# Patient Record
Sex: Female | Born: 1960 | Race: White | Hispanic: No | Marital: Married | Smoking: Never smoker
Health system: Southern US, Community
[De-identification: ages and names within clinical notes are randomized; demographics above are authoritative.]

## PROBLEM LIST (undated history)

## (undated) DIAGNOSIS — J45909 Unspecified asthma, uncomplicated: Secondary | ICD-10-CM

## (undated) DIAGNOSIS — T7840XA Allergy, unspecified, initial encounter: Secondary | ICD-10-CM

## (undated) DIAGNOSIS — F419 Anxiety disorder, unspecified: Secondary | ICD-10-CM

## (undated) DIAGNOSIS — R413 Other amnesia: Secondary | ICD-10-CM

## (undated) DIAGNOSIS — H59023 Cataract (lens) fragments in eye following cataract surgery, bilateral: Secondary | ICD-10-CM

## (undated) DIAGNOSIS — K219 Gastro-esophageal reflux disease without esophagitis: Secondary | ICD-10-CM

## (undated) DIAGNOSIS — E785 Hyperlipidemia, unspecified: Secondary | ICD-10-CM

## (undated) DIAGNOSIS — I491 Atrial premature depolarization: Secondary | ICD-10-CM

## (undated) DIAGNOSIS — F329 Major depressive disorder, single episode, unspecified: Secondary | ICD-10-CM

## (undated) DIAGNOSIS — I10 Essential (primary) hypertension: Secondary | ICD-10-CM

## (undated) HISTORY — DX: Allergy, unspecified, initial encounter: T78.40XA

## (undated) HISTORY — DX: Atrial premature depolarization: I49.1

## (undated) HISTORY — DX: Essential (primary) hypertension: I10

## (undated) HISTORY — DX: Unspecified asthma, uncomplicated: J45.909

## (undated) HISTORY — DX: Gastro-esophageal reflux disease without esophagitis: K21.9

## (undated) HISTORY — PX: OTHER SURGICAL HISTORY: SHX169

## (undated) HISTORY — DX: Hyperlipidemia, unspecified: E78.5

## (undated) HISTORY — DX: Cataract (lens) fragments in eye following cataract surgery, bilateral: H59.023

## (undated) HISTORY — DX: Major depressive disorder, single episode, unspecified: F32.9

---

## 1985-04-05 HISTORY — PX: CHOLECYSTECTOMY: SHX55

## 1985-04-05 HISTORY — PX: APPENDECTOMY: SHX54

## 1990-04-05 DIAGNOSIS — E785 Hyperlipidemia, unspecified: Secondary | ICD-10-CM

## 1990-04-05 DIAGNOSIS — I10 Essential (primary) hypertension: Secondary | ICD-10-CM

## 1990-04-05 HISTORY — DX: Essential (primary) hypertension: I10

## 1990-04-05 HISTORY — DX: Hyperlipidemia, unspecified: E78.5

## 1995-04-06 HISTORY — PX: ANTERIOR CRUCIATE LIGAMENT REPAIR: SHX115

## 1999-09-05 ENCOUNTER — Encounter: Payer: Self-pay | Admitting: Emergency Medicine

## 1999-09-05 ENCOUNTER — Emergency Department (HOSPITAL_COMMUNITY): Admission: EM | Admit: 1999-09-05 | Discharge: 1999-09-05 | Payer: Self-pay | Admitting: Emergency Medicine

## 2004-10-25 ENCOUNTER — Emergency Department (HOSPITAL_COMMUNITY): Admission: EM | Admit: 2004-10-25 | Discharge: 2004-10-26 | Payer: Self-pay | Admitting: Emergency Medicine

## 2008-09-26 ENCOUNTER — Emergency Department (HOSPITAL_COMMUNITY): Admission: EM | Admit: 2008-09-26 | Discharge: 2008-09-26 | Payer: Self-pay | Admitting: Emergency Medicine

## 2008-11-18 ENCOUNTER — Observation Stay (HOSPITAL_COMMUNITY): Admission: EM | Admit: 2008-11-18 | Discharge: 2008-11-20 | Payer: Self-pay | Admitting: Emergency Medicine

## 2008-11-19 ENCOUNTER — Encounter (INDEPENDENT_AMBULATORY_CARE_PROVIDER_SITE_OTHER): Payer: Self-pay | Admitting: Internal Medicine

## 2009-01-14 ENCOUNTER — Ambulatory Visit (HOSPITAL_COMMUNITY): Admission: RE | Admit: 2009-01-14 | Discharge: 2009-01-14 | Payer: Self-pay | Admitting: Obstetrics and Gynecology

## 2009-01-14 ENCOUNTER — Encounter (INDEPENDENT_AMBULATORY_CARE_PROVIDER_SITE_OTHER): Payer: Self-pay | Admitting: Obstetrics and Gynecology

## 2009-02-13 ENCOUNTER — Emergency Department (HOSPITAL_COMMUNITY): Admission: EM | Admit: 2009-02-13 | Discharge: 2009-02-13 | Payer: Self-pay | Admitting: Emergency Medicine

## 2010-07-09 LAB — COMPREHENSIVE METABOLIC PANEL
ALT: 16 U/L (ref 0–35)
AST: 27 U/L (ref 0–37)
Albumin: 3.3 g/dL — ABNORMAL LOW (ref 3.5–5.2)
Alkaline Phosphatase: 58 U/L (ref 39–117)
BUN: 7 mg/dL (ref 6–23)
CO2: 25 mEq/L (ref 19–32)
Calcium: 8.6 mg/dL (ref 8.4–10.5)
Chloride: 101 mEq/L (ref 96–112)
Creatinine, Ser: 0.66 mg/dL (ref 0.4–1.2)
GFR calc Af Amer: >60 mL/min (ref >60)
GFR calc non Af Amer: >60 mL/min (ref >60)
Glucose, Bld: 118 mg/dL — ABNORMAL HIGH (ref 70–99)
Potassium: 3 mEq/L — ABNORMAL LOW (ref 3.5–5.1)
Sodium: 134 mEq/L — ABNORMAL LOW (ref 135–145)
Total Bilirubin: 0.6 mg/dL (ref 0.3–1.2)
Total Protein: 7.1 g/dL (ref 6.0–8.3)

## 2010-07-09 LAB — CBC
HCT: 33.9 % — ABNORMAL LOW (ref 36.0–46.0)
Hemoglobin: 10.9 g/dL — ABNORMAL LOW (ref 12.0–15.0)
MCHC: 32.3 g/dL (ref 30.0–36.0)
MCV: 75.4 fL — ABNORMAL LOW (ref 78.0–100.0)
Platelets: 352 10*3/uL (ref 150–400)
RBC: 4.49 MIL/uL (ref 3.87–5.11)
RDW: 17.4 % — ABNORMAL HIGH (ref 11.5–15.5)
WBC: 6 10*3/uL (ref 4.0–10.5)

## 2010-07-09 LAB — GLUCOSE, CAPILLARY
Glucose-Capillary: 115 mg/dL — ABNORMAL HIGH (ref 70–99)
Glucose-Capillary: 127 mg/dL — ABNORMAL HIGH (ref 70–99)

## 2010-07-09 LAB — HCG, SERUM, QUALITATIVE: Preg, Serum: NEGATIVE

## 2010-07-11 LAB — CARDIAC PANEL(CRET KIN+CKTOT+MB+TROPI)
Relative Index: INVALID (ref 0.0–2.5)
Relative Index: INVALID (ref 0.0–2.5)
Total CK: 49 U/L (ref 7–177)
Troponin I: 0.01 ng/mL (ref 0.00–0.06)

## 2010-07-11 LAB — GLUCOSE, CAPILLARY
Glucose-Capillary: 108 mg/dL — ABNORMAL HIGH (ref 70–99)
Glucose-Capillary: 110 mg/dL — ABNORMAL HIGH (ref 70–99)
Glucose-Capillary: 110 mg/dL — ABNORMAL HIGH (ref 70–99)
Glucose-Capillary: 115 mg/dL — ABNORMAL HIGH (ref 70–99)
Glucose-Capillary: 122 mg/dL — ABNORMAL HIGH (ref 70–99)
Glucose-Capillary: 127 mg/dL — ABNORMAL HIGH (ref 70–99)
Glucose-Capillary: 138 mg/dL — ABNORMAL HIGH (ref 70–99)
Glucose-Capillary: 144 mg/dL — ABNORMAL HIGH (ref 70–99)
Glucose-Capillary: 151 mg/dL — ABNORMAL HIGH (ref 70–99)

## 2010-07-11 LAB — BASIC METABOLIC PANEL
BUN: 4 mg/dL — ABNORMAL LOW (ref 6–23)
BUN: 5 mg/dL — ABNORMAL LOW (ref 6–23)
CO2: 24 mEq/L (ref 19–32)
CO2: 24 mEq/L (ref 19–32)
Calcium: 9.4 mg/dL (ref 8.4–10.5)
Chloride: 107 mEq/L (ref 96–112)
Chloride: 110 mEq/L (ref 96–112)
Creatinine, Ser: 0.49 mg/dL (ref 0.4–1.2)
GFR calc non Af Amer: >60 mL/min (ref >60)
Glucose, Bld: 109 mg/dL — ABNORMAL HIGH (ref 70–99)
Glucose, Bld: 174 mg/dL — ABNORMAL HIGH (ref 70–99)
Potassium: 3.3 mEq/L — ABNORMAL LOW (ref 3.5–5.1)
Potassium: 3.6 mEq/L (ref 3.5–5.1)
Potassium: 4 mEq/L (ref 3.5–5.1)
Sodium: 136 mEq/L (ref 135–145)

## 2010-07-11 LAB — COMPREHENSIVE METABOLIC PANEL
ALT: 23 U/L (ref 0–35)
Alkaline Phosphatase: 65 U/L (ref 39–117)
BUN: 6 mg/dL (ref 6–23)
CO2: 24 mEq/L (ref 19–32)
Chloride: 102 mEq/L (ref 96–112)
GFR calc non Af Amer: >60 mL/min (ref >60)
Glucose, Bld: 125 mg/dL — ABNORMAL HIGH (ref 70–99)
Potassium: 3.2 mEq/L — ABNORMAL LOW (ref 3.5–5.1)
Sodium: 135 mEq/L (ref 135–145)
Total Bilirubin: 0.7 mg/dL (ref 0.3–1.2)

## 2010-07-11 LAB — DIFFERENTIAL
Basophils Absolute: 0.2 10*3/uL — ABNORMAL HIGH (ref 0.0–0.1)
Basophils Relative: 1 % (ref 0–1)
Eosinophils Absolute: 0.2 10*3/uL (ref 0.0–0.7)
Eosinophils Relative: 2 % (ref 0–5)
Eosinophils Relative: 4 % (ref 0–5)
Lymphocytes Relative: 22 % (ref 12–46)
Lymphs Abs: 1.9 10*3/uL (ref 0.7–4.0)
Lymphs Abs: 2.3 10*3/uL (ref 0.7–4.0)
Neutro Abs: 5.3 10*3/uL (ref 1.7–7.7)
Neutrophils Relative %: 50 % (ref 43–77)

## 2010-07-11 LAB — CBC
HCT: 31.9 % — ABNORMAL LOW (ref 36.0–46.0)
HCT: 33.8 % — ABNORMAL LOW (ref 36.0–46.0)
HCT: 38.9 % (ref 36.0–46.0)
Hemoglobin: 12.8 g/dL (ref 12.0–15.0)
MCHC: 32.6 g/dL (ref 30.0–36.0)
MCHC: 33.1 g/dL (ref 30.0–36.0)
MCV: 78.9 fL (ref 78.0–100.0)
MCV: 79 fL (ref 78.0–100.0)
Platelets: 208 10*3/uL (ref 150–400)
Platelets: 238 10*3/uL (ref 150–400)
Platelets: 328 10*3/uL (ref 150–400)
RDW: 17.9 % — ABNORMAL HIGH (ref 11.5–15.5)
WBC: 7.1 10*3/uL (ref 4.0–10.5)
WBC: 8.3 10*3/uL (ref 4.0–10.5)

## 2010-07-11 LAB — POCT CARDIAC MARKERS
CKMB, poc: <1 ng/mL — ABNORMAL LOW (ref 1.0–8.0)
Troponin i, poc: <0.05 ng/mL (ref 0.00–0.09)

## 2010-07-11 LAB — TSH: TSH: 2.83 u[IU]/mL (ref 0.350–4.500)

## 2010-07-13 LAB — POCT CARDIAC MARKERS
CKMB, poc: <1 ng/mL — ABNORMAL LOW (ref 1.0–8.0)
Troponin i, poc: <0.05 ng/mL (ref 0.00–0.09)

## 2010-07-13 LAB — DIFFERENTIAL
Basophils Absolute: 0.1 10*3/uL (ref 0.0–0.1)
Basophils Relative: 1 % (ref 0–1)
Eosinophils Absolute: 0.2 10*3/uL (ref 0.0–0.7)
Eosinophils Relative: 2 % (ref 0–5)
Monocytes Absolute: 0.6 10*3/uL (ref 0.1–1.0)

## 2010-07-13 LAB — BASIC METABOLIC PANEL
BUN: 9 mg/dL (ref 6–23)
CO2: 24 mEq/L (ref 19–32)
Calcium: 9.4 mg/dL (ref 8.4–10.5)
GFR calc non Af Amer: >60 mL/min (ref >60)
Glucose, Bld: 154 mg/dL — ABNORMAL HIGH (ref 70–99)
Potassium: 3.3 mEq/L — ABNORMAL LOW (ref 3.5–5.1)
Sodium: 140 mEq/L (ref 135–145)

## 2010-07-13 LAB — CBC
HCT: 34 % — ABNORMAL LOW (ref 36.0–46.0)
Hemoglobin: 11 g/dL — ABNORMAL LOW (ref 12.0–15.0)
MCHC: 32.2 g/dL (ref 30.0–36.0)
Platelets: 297 10*3/uL (ref 150–400)
RDW: 20.1 % — ABNORMAL HIGH (ref 11.5–15.5)

## 2010-08-18 NOTE — Consult Note (Signed)
NAME:  RETTA, Alicia Pope NO.:  [id number]   MEDICAL RECORD NO.:  [id number]          PATIENT TYPE:  INP   LOCATION:  5502                         FACILITY:  [facility]   PHYSICIAN:  Lyn Records, M.D.   DATE OF BIRTH:  06/30/1960   DATE OF CONSULTATION:  11/18/2008  DATE OF DISCHARGE:                                 CONSULTATION   REFERRING PHYSICIAN:  Elana Alm. Nicholos Johns, MD   CONCLUSIONS:  1. Chest pressure with left neck/jaw discomfort.  Rule out myocardial      infarction with markers and serial EKGs.  2. Diabetes mellitus for greater than 5 years.  3. Hypertension.  4. Hyperlipidemia.  5. Hypokalemia.   RECOMMENDATIONS:  1. Rule out MI with serial enzymes and EKGs.  2. Stress Cardiolite, November 19, 2008.  3. Check D-dimer.   COMMENT:  The patient is a 50 year old obese Caucasian female who has a  history of diabetes.  She developed sudden substernal discomfort.  She  has been having intermittent episodes over the past 2-3 weeks.  They are  associated with shortness of breath and radiation to the jaw on the left  side of her face.  She developed the discomfort that led to admission  late in the afternoon/early in the evening of November 18, 2008, and have  persisted for several hours.  Even after arriving in the emergency room,  the discomfort was continuous, without EKG changes and with a normal  initial cardiac marker.  She is currently painfree.  There was no  exertional component.   SIGNIFICANT MEDICAL PROBLEMS:  Listed above and in addition, there is an  apparent history of an anxiety disorder.   ALLERGIES:  IODINATED CONTRAST.   FAMILY HISTORY:  Negative for CAD.   HABITS:  Does not smoke or drink.   PHYSICAL EXAMINATION:  GENERAL:  The patient is in no acute distress.  VITAL SIGNS:  The blood pressure is 109/67, heart rate is 77.  NECK:  Short and stout.  Jugular veins are not visible.  LUNGS:  Clear.  CARDIAC:  No rub or gallop.  ABDOMEN:   Soft.  EXTREMITIES:  No edema.   The patient's EKG is unremarkable.  There is a slight left axis.  Chest  x-ray reveals no acute abnormality.  Initial laboratory data reveals a  creatinine of 0.53, BUN of 4, potassium 4.0.  Hemoglobin 11, WBCs 7.1.  initial set of cardiac biomarkers are normal.   DISCUSSION:  The patient's symptoms have to be taken seriously given her  multiple risk factors.  So far with a prolonged episode of discomfort,  the markers and EKGs are normal, which is reassuring.  She will need to  have a nuclear study to rule out coronary artery disease.      Lyn Records, M.D.  Electronically Signed     HWS/MEDQ  D:  11/19/2008  T:  11/19/2008  Job:  [id number]

## 2010-08-18 NOTE — H&P (Signed)
NAME:  Alicia Pope, Alicia Pope NO.:  [id number]   MEDICAL RECORD NO.:  [id number]          PATIENT TYPE:  INP   LOCATION:  5502                         FACILITY:  [facility]   PHYSICIAN:  Donalynn Furlong, MD      DATE OF BIRTH:  [birth year]   DATE OF ADMISSION:  11/17/2008  DATE OF DISCHARGE:                              HISTORY & PHYSICAL   PRIMARY CARE Suzann Lazaro:  Elana Alm. Nicholos Johns, M.D.   CHIEF COMPLAINT:  Chest pain.   HISTORY OF PRESENT ILLNESS:  Alicia Pope is a 50 year old Caucasian female  living with her husband in [place].  She presented to [facility] ED  tonight with the chest pain.  She was watching TV 6 p.m. yesterday  evening when she started having pressure-like chest pain in the left  side of chest and the center of chest.  The chest pain was without any  radiation.  She also started hurting in her left side of neck at the  same time.  She did not have any other associated symptoms with chest  pain.  The patient mentions that her chest pain is constant since 6  o'clock but intensity is reduced now after getting 325 mg of aspirin and  2 tablets of nitro. She took her lorazepam at home which did not relieve  her chest pain.  She tried to sleep which did not relieve her chest  pain.  She decided to come to the emergency department because she was  not getting any better and she thought she needed to see the doctor.  She denies any history of coronary artery disease, stress test, cardiac  cath or echocardiogram in the past.  She has history of episode of chest  pain in the past, most likely due to anxiety and started on Zoloft by  her primary care Kadon Andrus for that.  The patient denies any history of  clots in the lungs or pneumonia.  She has a history of occasional  gastroesophageal reflux disease.  The patient has had her gallbladder  and appendix removed in the past.   PAST MEDICAL HISTORY:  1. Diabetes mellitus.  2. Hypertension.  3. Hyperlipidemia.  4.  Cholecystectomy.  5. Appendectomy.  6. Four cesarean sections  7. ACL repair.   PAST SURGICAL HISTORY:  As per past medical history.   ALLERGIES:  IODINE.   HOME MEDICATION:  List includes Avalide, Avandamet, Crestor and Norvasc.  She also takes lorazepam and Zoloft.  Doses of these medications are not  known at this time.   REVIEW OF SYSTEMS:  Positive as per HPI, otherwise negative review of  systems done of 14 system.   SOCIAL HISTORY:  The patient lives with her husband.  She is not  working.  The patient denies any alcohol, drug or tobacco use in the  past.   FAMILY HISTORY:  No history of premature coronary artery disease in  father, mother, brother, sister or grandparents.  The patient's father  has  heart failure.   PHYSICAL EXAMINATION:  VITAL SIGNS: Blood pressure 100/61,  pulse 65 per  minute, respiration 18  per minute, temperature 98.6. Oxygen saturation  95% in room air.  GENERAL EXAM:  Alert, oriented x3, laying in bed without any acute  distress.  CARDIOVASCULAR:  S1-S2 regular.  No murmur or gallop.  LUNGS:  Clear to auscultation bilaterally.  No wheezing, rhonchi,  crackles.  ABDOMEN:  Nontender, nondistended.  Mild sounds present.  EXTREMITIES:  No clubbing, cyanosis or edema.  Pulses palpable in all 4  extremities.  HEAD:  Normocephalic nontraumatic.  EYES:  Pupils react to light and accommodation.  Extraocular muscles  intact.  ORAL CAVITY:  Oral mucosa moist.  No thrush noted.  NECK:  No thyromegaly or JVD.  SKIN:  No rash or bruise.  NEUROLOGICAL:  Exam shows intact cranial nerves, muscular strength,  sensation and reflexes.   LAB WORK:  CBC with differential unremarkable.  First set of cardiac  enzymes negative.  Chest x-ray unremarkable.  EKG unremarkable for any  acute ST-T changes.  Comprehensive metabolic panel is pending.   ASSESSMENT:  1. Atypical chest pain, rule out myocardial infarction.  Most likely      this chest pain is due to her  anxiety.  2. History of hypertension.  3. History of diabetes mellitus.  4. History of hyperlipidemia.  5. History of cholecystectomy.  6. History of appendectomy.  7. History of anxiety.   PLAN:  Will admit the patient on telemetry bed under [facility] with  a diagnosis of chest pain, hypokalemia.  We will check CBC, CMP, EKG in  the morning.  Will get cardiac enzymes with troponin at 6 a.m. and 12  p.m. We will check vital signs every 4 hours.  Will provide oxygen by  nasal cannula.  Will keep her n.p.o. except ice chips, water and  medications.  Will provide normal saline for hydration.  Will start  aspirin 325 mg daily, nitro paste 1 inch q.6 hours, metoprolol 25 mg  b.i.d.,  lisinopril 5 mg daily, simvastatin 20 mg daily, IV morphine 2  mg q.4 hours p.r.n., Lovenox 1 mg/kg subcutaneously q.12 hours, p.o.  potassium chloride 20 mEq q.8 hours x3 doses only and sliding scale  insulin with scale a.c. and h.s. tonight.  We will get will get exercise  Cardiolite stress test in the morning once MI is ruled out after three  negative troponins.  Will provide Tylenol, Phenergan, Ambien, laxative  p.r.n. for symptomatic control.  Further plan according to the workup  pending.      Donalynn Furlong, MD  Electronically Signed     TVP/MEDQ  D:  11/18/2008  T:  11/18/2008  Job:  [id number]   cc:   Molly Maduro A. Nicholos Johns, M.D.

## 2010-08-18 NOTE — Discharge Summary (Signed)
Alicia Pope, Alicia Pope              ACCOUNT NO.:  [id number]   MEDICAL RECORD NO.:  [id number]          PATIENT TYPE:  INP   LOCATION:  5502                         FACILITY:  [facility]   PHYSICIAN:  Kela Millin, M.D.DATE OF BIRTH:  [birth year]   DATE OF ADMISSION:  11/17/2008  DATE OF DISCHARGE:  11/20/2008                               DISCHARGE SUMMARY   DIAGNOSES AT THE TIME OF DISCHARGE:  1. Atypical chest pain.  2. Diabetes type 2.  3. Dyslipidemia.  4. Hypertension.   HISTORY OF PRESENT ILLNESS:  Ms. Oliveto is a 50 year old female, who was  admitted on November 18, 2008, with chief complaint of chest pain.  She  presented to the [facility] Emergency Department after having noted  pressure-like chest pain in the left side of her chest.  She also noted  some pain in the left side of her neck at the same time.  She was  admitted for further evaluation and treatment.   PAST MEDICAL HISTORY:  1. Diabetes type 2.  2. Hypertension.  3. Hyperlipidemia.  4. Status post cholecystectomy.  5. Status post appendectomy.  6. Status post C-section x4.  7. ACL repair.   COURSE OF HOSPITALIZATION:  1. Atypical chest pain.  The patient was admitted.  She underwent      serial cardiac enzymes, which were negative x3.  A Cardiology      consult was requested, and the patient was seen in consultation by      Dr. Verdis Prime.  He recommended a Cardiolite, which was performed      on November 20, 2008.  Cardiolite, no fixed or reversible defects to      suggest inducible ischemia, it notes a normal LV wall motion and      thickening.  Normal ejection fraction was also noted.  Of note, she      does have some mild hypotension, which was attributed to      nitroglycerin patch.  This is been discontinued.  Her blood      pressure at the time of discharge is 108/67.  We will continue to      hold her antihypertensive medications at the time of discharge and      defer resuming these meds to her  primary care physician at her      followup appointment.  In addition, we will continue a baby aspirin      at the time of discharge for cardiac protection due to multiple      risk factors.   MEDICATIONS AT THE TIME OF DISCHARGE:  1. Avandamet 4 - 500 p.o. b.i.d.  2. Crestor 20 mg p.o. daily.  3. Iron tabs 325 mg p.o. t.i.d.  4. Zoloft 50 mg p.o. daily.  5. Lorazepam 1 mg p.r.n. b.i.d. for anxiety.  6. Ambien 10 mg p.o. at bedtime p.r.n. sleep.  7. Aspirin 81 mg p.o. daily.  8. Norvasc 5 mg p.o. daily.  9. Avalide 300/25 mg p.o. daily, currently on hold.   PERTINENT LABORATORIES AT THE TIME OF DISCHARGE:  Sodium 139, potassium  3.6, chloride 110,  BUN 5, creatinine 0.49, and glucose 109.  Hemoglobin  10.6, hematocrit 31.9, and white blood cell count 6, and platelets 208.  Hemoglobin A1c 6.3.   DISPOSITION:  She will be discharged to home.   FOLLOWUP:  She is instructed to follow up with Dr. Elias Else in 1  week and contact the office for an appointment.   Greater than 30 minutes was spent on discharge planning.      Sandford Craze, NP      Kela Millin, M.D.  Electronically Signed    MO/MEDQ  D:  11/20/2008  T:  11/21/2008  Job:  [id number]   cc:   Lyn Records, M.D.  Robert A. Nicholos Johns, M.D.

## 2011-04-06 DIAGNOSIS — K219 Gastro-esophageal reflux disease without esophagitis: Secondary | ICD-10-CM

## 2011-04-06 DIAGNOSIS — F419 Anxiety disorder, unspecified: Secondary | ICD-10-CM

## 2011-04-06 DIAGNOSIS — F32A Depression, unspecified: Secondary | ICD-10-CM

## 2011-04-06 HISTORY — DX: Depression, unspecified: F32.A

## 2011-04-06 HISTORY — DX: Gastro-esophageal reflux disease without esophagitis: K21.9

## 2011-04-06 HISTORY — DX: Anxiety disorder, unspecified: F41.9

## 2011-05-20 ENCOUNTER — Other Ambulatory Visit: Payer: Self-pay | Admitting: Physician Assistant

## 2011-05-20 DIAGNOSIS — H5702 Anisocoria: Secondary | ICD-10-CM

## 2011-05-21 ENCOUNTER — Ambulatory Visit
Admission: RE | Admit: 2011-05-21 | Discharge: 2011-05-21 | Disposition: A | Discharge status: Home / Self Care | Payer: 59 | Source: Ambulatory Visit | Attending: Physician Assistant | Admitting: Physician Assistant

## 2011-05-21 DIAGNOSIS — H5702 Anisocoria: Secondary | ICD-10-CM

## 2011-05-21 MED ORDER — GADOBENATE DIMEGLUMINE 529 MG/ML IV SOLN
20.0000 mL | Freq: Once | INTRAVENOUS | Status: AC | PRN
  Administered 2011-05-21: 20 mL via INTRAVENOUS

## 2011-07-31 ENCOUNTER — Emergency Department (HOSPITAL_COMMUNITY)
Admission: EM | Admit: 2011-07-31 | Discharge: 2011-07-31 | Disposition: A | Discharge status: Home / Self Care | Payer: 59 | Attending: Emergency Medicine | Admitting: Emergency Medicine

## 2011-07-31 DIAGNOSIS — F419 Anxiety disorder, unspecified: Secondary | ICD-10-CM

## 2011-07-31 DIAGNOSIS — E119 Type 2 diabetes mellitus without complications: Secondary | ICD-10-CM | POA: Insufficient documentation

## 2011-07-31 DIAGNOSIS — R55 Syncope and collapse: Secondary | ICD-10-CM | POA: Insufficient documentation

## 2011-07-31 DIAGNOSIS — R51 Headache: Secondary | ICD-10-CM | POA: Insufficient documentation

## 2011-07-31 DIAGNOSIS — F411 Generalized anxiety disorder: Secondary | ICD-10-CM | POA: Insufficient documentation

## 2011-07-31 HISTORY — DX: Anxiety disorder, unspecified: F41.9

## 2011-07-31 HISTORY — DX: Other amnesia: R41.3

## 2011-07-31 LAB — URINALYSIS, ROUTINE W REFLEX MICROSCOPIC
Bilirubin Urine: NEGATIVE
Leukocytes, UA: NEGATIVE
Nitrite: NEGATIVE
Specific Gravity, Urine: 1.014 (ref 1.005–1.030)
Urobilinogen, UA: 0.2 mg/dL (ref 0.0–1.0)
pH: 6 (ref 5.0–8.0)

## 2011-07-31 LAB — POCT I-STAT, CHEM 8
Glucose, Bld: 134 mg/dL — ABNORMAL HIGH (ref 70–99)
HCT: 43 % (ref 36.0–46.0)
Hemoglobin: 14.6 g/dL (ref 12.0–15.0)
Potassium: 3.8 mEq/L (ref 3.5–5.1)
Sodium: 143 mEq/L (ref 135–145)

## 2011-07-31 MED ORDER — ACETAMINOPHEN 325 MG PO TABS: 650.0000 mg | ORAL_TABLET | Freq: Once | ORAL | Status: DC
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
## 2011-07-31 NOTE — Discharge Instructions (Signed)
Follow up with the cardiologist you have been referred to.  Follow up with your psychiatrist and/or family physician, as well as the neurologist as scheduled.  Drink plenty of fluids.  You should return to the ER if you develop chest pain, shortness of breath, palpitations or another passing out spell.
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
## 2011-07-31 NOTE — ED Notes (Signed)
Pt states she "just wants to go home" as she is walking out of room to find bathroom,  Pt escorted to bathroom for urine sample and ask to please wait until seen by MD

## 2011-08-01 ENCOUNTER — Encounter (HOSPITAL_COMMUNITY): Payer: Self-pay | Admitting: Emergency Medicine

## 2011-08-01 NOTE — ED Provider Notes (Signed)
Medical screening examination/treatment/procedure(s) were performed by non-physician practitioner and as supervising physician I was immediately available for consultation/collaboration.  Jaray Boliver, MD 08/01/11 1010 

## 2013-02-26 ENCOUNTER — Emergency Department (HOSPITAL_COMMUNITY): Payer: 59

## 2013-02-26 ENCOUNTER — Encounter (HOSPITAL_COMMUNITY): Payer: Self-pay | Admitting: Emergency Medicine

## 2013-02-26 ENCOUNTER — Emergency Department (HOSPITAL_COMMUNITY)
Admission: EM | Admit: 2013-02-26 | Discharge: 2013-02-26 | Disposition: A | Discharge status: Home / Self Care | Payer: 59 | Attending: Emergency Medicine | Admitting: Emergency Medicine

## 2013-02-26 DIAGNOSIS — R21 Rash and other nonspecific skin eruption: Secondary | ICD-10-CM

## 2013-02-26 DIAGNOSIS — D696 Thrombocytopenia, unspecified: Secondary | ICD-10-CM

## 2013-02-26 DIAGNOSIS — E119 Type 2 diabetes mellitus without complications: Secondary | ICD-10-CM | POA: Insufficient documentation

## 2013-02-26 DIAGNOSIS — Z79899 Other long term (current) drug therapy: Secondary | ICD-10-CM | POA: Insufficient documentation

## 2013-02-26 DIAGNOSIS — F411 Generalized anxiety disorder: Secondary | ICD-10-CM | POA: Insufficient documentation

## 2013-02-26 DIAGNOSIS — J159 Unspecified bacterial pneumonia: Secondary | ICD-10-CM | POA: Insufficient documentation

## 2013-02-26 DIAGNOSIS — J189 Pneumonia, unspecified organism: Secondary | ICD-10-CM

## 2013-02-26 LAB — CBC WITH DIFFERENTIAL/PLATELET
Basophils Absolute: 0 10*3/uL (ref 0.0–0.1)
Eosinophils Absolute: 0.3 10*3/uL (ref 0.0–0.7)
Eosinophils Relative: 6 % — ABNORMAL HIGH (ref 0–5)
Hemoglobin: 13 g/dL (ref 12.0–15.0)
Lymphocytes Relative: 18 % (ref 12–46)
Lymphs Abs: 1 10*3/uL (ref 0.7–4.0)
MCH: 27.8 pg (ref 26.0–34.0)
Neutro Abs: 3.6 10*3/uL (ref 1.7–7.7)
Neutrophils Relative %: 70 % (ref 43–77)
Platelets: 129 10*3/uL — ABNORMAL LOW (ref 150–400)
RBC: 4.68 MIL/uL (ref 3.87–5.11)
RDW: 15 % (ref 11.5–15.5)
WBC: 5.2 10*3/uL (ref 4.0–10.5)

## 2013-02-26 LAB — COMPREHENSIVE METABOLIC PANEL
ALT: 37 U/L — ABNORMAL HIGH (ref 0–35)
AST: 43 U/L — ABNORMAL HIGH (ref 0–37)
Albumin: 3.2 g/dL — ABNORMAL LOW (ref 3.5–5.2)
Alkaline Phosphatase: 66 U/L (ref 39–117)
CO2: 21 mEq/L (ref 19–32)
Calcium: 8.8 mg/dL (ref 8.4–10.5)
Chloride: 103 mEq/L (ref 96–112)
GFR calc Af Amer: >90 mL/min (ref >90)
GFR calc non Af Amer: >90 mL/min (ref >90)
Glucose, Bld: 158 mg/dL — ABNORMAL HIGH (ref 70–99)
Potassium: 3.4 mEq/L — ABNORMAL LOW (ref 3.5–5.1)
Sodium: 136 mEq/L (ref 135–145)

## 2013-02-26 LAB — POCT I-STAT TROPONIN I: Troponin i, poc: 0 ng/mL (ref 0.00–0.08)

## 2013-02-26 LAB — CG4 I-STAT (LACTIC ACID): Lactic Acid, Venous: 2.13 mmol/L (ref 0.5–2.2)

## 2013-02-26 LAB — URINALYSIS W MICROSCOPIC + REFLEX CULTURE
Bilirubin Urine: NEGATIVE
Glucose, UA: 100 mg/dL — AB
Hgb urine dipstick: NEGATIVE
Ketones, ur: 15 mg/dL — AB
Specific Gravity, Urine: 1.025 (ref 1.005–1.030)
Urobilinogen, UA: 0.2 mg/dL (ref 0.0–1.0)
pH: 6 (ref 5.0–8.0)

## 2013-02-26 MED ORDER — DEXTROSE 5 % IV SOLN
500.0000 mg | Freq: Once | INTRAVENOUS | Status: AC
  Administered 2013-02-26: 500 mg via INTRAVENOUS

## 2013-02-26 MED ORDER — ALBUTEROL SULFATE HFA 108 (90 BASE) MCG/ACT IN AERS
2.0000 | INHALATION_SPRAY | Freq: Once | RESPIRATORY_TRACT | Status: AC
  Administered 2013-02-26: 2 via RESPIRATORY_TRACT
  Filled 2013-02-26: qty 6.7

## 2013-02-26 MED ORDER — HYDROCODONE-HOMATROPINE 5-1.5 MG/5ML PO SYRP: 5.0000 mL | ORAL_SOLUTION | Freq: Four times a day (QID) | ORAL | Status: DC | PRN

## 2013-02-26 MED ORDER — AZITHROMYCIN 250 MG PO TABS: ORAL_TABLET | ORAL | Status: DC

## 2013-02-26 MED ORDER — LORAZEPAM 1 MG PO TABS
1.0000 mg | ORAL_TABLET | Freq: Once | ORAL | Status: AC
  Administered 2013-02-26: 1 mg via ORAL
  Filled 2013-02-26: qty 1

## 2013-02-26 MED ORDER — DEXTROSE 5 % IV SOLN
1.0000 g | Freq: Once | INTRAVENOUS | Status: AC
  Administered 2013-02-26: 1 g via INTRAVENOUS
  Filled 2013-02-26: qty 10

## 2013-02-26 MED ORDER — ACETAMINOPHEN 325 MG PO TABS
650.0000 mg | ORAL_TABLET | Freq: Once | ORAL | Status: AC
  Administered 2013-02-26: 650 mg via ORAL
  Filled 2013-02-26: qty 2

## 2013-02-26 MED ORDER — SODIUM CHLORIDE 0.9 % IV SOLN
Freq: Once | INTRAVENOUS | Status: AC
  Administered 2013-02-26: 07:00:00 via INTRAVENOUS

## 2013-02-26 NOTE — ED Provider Notes (Signed)
CSN: [id number]     Arrival date & time 02/26/13  0608 History   First MD Initiated Contact with Patient 02/26/13 0631     Chief Complaint  Patient presents with  . Shortness of Breath    patient stated went to the doctor office one week ago, started cold like symptoms yesterday, came in with shortness of breath   (Consider location/radiation/quality/duration/timing/severity/associated sxs/prior Treatment) HPI Alicia Pope is a 52 y.o. female who presents to emergency department with complaint of rash, fever, shortness of breath. Patient states that she saw a doctor approximately week ago for a rash that began on her feet and since then spread up to her thighs. States that her Dr. diagnosed her with a staph infection and placed on doxycycline which she stating. States rash is worsening. States rash is nontender, but very itchy. States also began having cough approximately 3 days ago. States that she asked her Dr. about a cough and was told to take some Mucinex. States that last night cough is worsened and this morning she began feeling short of breath. States that she has had similar shortness of breath in the past when had anxiety. Patient states that she is very anxious right now, states "I feel rattling in my throat and that is what my father had it before he died." Patient also concerned about her rash and states that her nephew was just diagnosed with leukemia. Patient denies any headache or neck pain or stiffness. She denies any recent travel. She denies any camping trips or tick bites.  Past Medical History  Diagnosis Date  . Anxiety   . Memory impairment   . Diabetes mellitus    History reviewed. No pertinent past surgical history. History reviewed. No pertinent family history. History  Substance Use Topics  . Smoking status: Not on file  . Smokeless tobacco: Not on file  . Alcohol Use:    OB History   Grav Para Term Preterm Abortions TAB SAB Ect Mult Living                  Review of Systems  Constitutional: Negative for fever and chills.  Respiratory: Positive for cough, chest tightness and shortness of breath.   Cardiovascular: Positive for chest pain. Negative for palpitations and leg swelling.  Gastrointestinal: Negative for nausea, vomiting, abdominal pain and diarrhea.  Genitourinary: Negative for dysuria, flank pain, vaginal bleeding, vaginal discharge, vaginal pain and pelvic pain.  Musculoskeletal: Negative for arthralgias, myalgias, neck pain and neck stiffness.  Skin: Positive for rash.  Neurological: Negative for dizziness, weakness and headaches.  All other systems reviewed and are negative.    Allergies  Review of patient's allergies indicates not on file.  Home Medications   Current Outpatient Rx  Name  Route  Sig  Dispense  Refill  . metFORMIN (GLUCOPHAGE) 500 MG tablet   Oral   Take 500 mg by mouth 2 (two) times daily with a meal.          BP 143/83  Pulse 82  Temp(Src) 101.2 F (38.4 C) (Oral)  Resp 20  Ht 5\' 2"  (1.575 m)  Wt 220 lb (99.791 kg)  BMI 40.23 kg/m2  SpO2 98% Physical Exam  Nursing note and vitals reviewed. Constitutional: She is oriented to person, place, and time. She appears well-developed and well-nourished. No distress.  HENT:  Head: Normocephalic.  Eyes: Conjunctivae are normal.  Neck: Neck supple.  No meningismus  Cardiovascular: Normal rate, regular rhythm and normal heart sounds.  Pulmonary/Chest: Effort normal and breath sounds normal. No respiratory distress. She has no wheezes. She has no rales.  Abdominal: Soft. Bowel sounds are normal. She exhibits no distension. There is no tenderness. There is no rebound.  Musculoskeletal: She exhibits no edema.  Neurological: She is alert and oriented to person, place, and time.  Skin: Skin is warm and dry. Rash noted.  Erythematous, nonblanching rash in large plaques with central small papules over bilateral legs and thighs.  Psychiatric: She has a  normal mood and affect. Her behavior is normal.    ED Course  Procedures (including critical care time) Labs Review Labs Reviewed  CBC WITH DIFFERENTIAL - Abnormal; Notable for the following:    Platelets 129 (*)    Eosinophils Relative 6 (*)    All other components within normal limits  COMPREHENSIVE METABOLIC PANEL - Abnormal; Notable for the following:    Potassium 3.4 (*)    Glucose, Bld 158 (*)    BUN 5 (*)    Albumin 3.2 (*)    AST 43 (*)    ALT 37 (*)    All other components within normal limits  URINALYSIS W MICROSCOPIC + REFLEX CULTURE - Abnormal; Notable for the following:    APPearance CLOUDY (*)    Glucose, UA 100 (*)    Ketones, ur 15 (*)    Crystals CA OXALATE CRYSTALS (*)    All other components within normal limits  CG4 I-STAT (LACTIC ACID)  POCT I-STAT TROPONIN I  POCT I-STAT TROPONIN I   Imaging Review Dg Chest 2 View  02/26/2013   CLINICAL DATA:  Shortness of breath.  Cough and fever.  EXAM: CHEST  2 VIEW  COMPARISON:  11/18/2008.  FINDINGS: Bilateral patchy pulmonary infiltrates are present throughout both lung fields consistent with pneumonia. Cardiomegaly. No pulmonary venous congestion. Mild fullness in the mediastinum. Mediastinal adenopathy cannot be excluded. No pleural effusion or pneumothorax. Splenic shadow is prominent. Splenomegaly cannot be excluded. CT of the chest and abdomen may prove useful for further evaluation. No acute osseous abnormality.  IMPRESSION: 1. Patchy bilateral pulmonary infiltrates consistent with pneumonia. 2. Mild mediastinal fullness, mediastinal adenopathy could present in this fashion. Possible splenomegaly. CT of the chest and abdomen should be considered for further evaluation of this patient .   Electronically Signed   By: Maisie Fus  Register   On: 02/26/2013 07:41    EKG Interpretation    Date/Time:  Monday February 26 2013 06:17:38 EST Ventricular Rate:  89 PR Interval:  148 QRS Duration: 149 QT Interval:  408 QTC  Calculation: 496 R Axis:   -76 Text Interpretation:  Sinus or ectopic atrial rhythm Probable left atrial enlargement IVCD, consider atypical RBBB Left ventricular hypertrophy Inferior infarct, old Anterior infarct, old Baseline wander in lead(s) I II aVR aVL aVF V1 V3 V6 Artifact No significant change since last tracing Abnormal ekg Confirmed by Gerhard Munch  MD ([id number]) on 02/26/2013 8:51:02 AM            MDM   1. CAP (community acquired pneumonia)   2. Rash   3. Thrombocytopenia     PT's with cough and chest pain. She is febrile on presentation with temp of 101.2. She is coughing. Currently on doxycycline for LE rash. She has no meningeal signs. No abdominal pain. No evidence of bleeding. VS normal other than the temperature.   CXR showed pneumonia and possible mediastinal adenopathy and splenomegaly. Concerning given low platelets and purpuric rash. Pt has allergy to contrast  material. I discussed it with Dr. Register, radiology, who advised 11hr prep and outpatient imaging. At this time, pt has no indications for inpatient admission. She is not tachycardic, not tachypnec, not hypoxic.  electrolytes normal. She is feeling better. Offered obs, she refused. I treated her in ED with IV zithromycin and rocephin. Given inhaler.   Discussed with radiology. Will do outpatient allergy prep at home, prescriptions given by radiology for benadryl and prednisone. She is also scheduled for outpatient CT of chest/abd/pelvis tomorrow. She understands all of the instructions and then plan. She is confortable going home and will return if her symptoms are worsening.   Filed Vitals:   02/26/13 0630 02/26/13 0645 02/26/13 0700 02/26/13 1026  BP: 143/83 122/79 117/69   Pulse: 82 84 76   Temp:      TempSrc:      Resp: 20 26 16    Height:      Weight:      SpO2: 98% 99% 98% 96%      Lottie Mussel, PA-C 02/26/13 1047

## 2013-02-26 NOTE — ED Notes (Signed)
Patient treated for staph infection, worseing 02/25/13, came in with shortness of breath temp 101.2

## 2013-02-27 ENCOUNTER — Ambulatory Visit (HOSPITAL_COMMUNITY): Admit: 2013-02-27 | Payer: 59

## 2013-02-27 ENCOUNTER — Other Ambulatory Visit (HOSPITAL_COMMUNITY): Payer: Self-pay | Admitting: Emergency Medicine

## 2013-02-27 ENCOUNTER — Other Ambulatory Visit (HOSPITAL_COMMUNITY): Payer: 59

## 2013-02-27 ENCOUNTER — Ambulatory Visit (HOSPITAL_COMMUNITY)
Admission: RE | Admit: 2013-02-27 | Discharge: 2013-02-27 | Disposition: A | Discharge status: Home / Self Care | Payer: 59 | Source: Ambulatory Visit | Attending: Emergency Medicine | Admitting: Emergency Medicine

## 2013-02-27 DIAGNOSIS — R05 Cough: Secondary | ICD-10-CM

## 2013-02-27 DIAGNOSIS — J180 Bronchopneumonia, unspecified organism: Secondary | ICD-10-CM | POA: Insufficient documentation

## 2013-02-27 DIAGNOSIS — R52 Pain, unspecified: Secondary | ICD-10-CM

## 2013-02-27 DIAGNOSIS — R059 Cough, unspecified: Secondary | ICD-10-CM

## 2013-02-27 DIAGNOSIS — R161 Splenomegaly, not elsewhere classified: Secondary | ICD-10-CM | POA: Insufficient documentation

## 2013-02-27 DIAGNOSIS — K7689 Other specified diseases of liver: Secondary | ICD-10-CM | POA: Insufficient documentation

## 2013-02-27 DIAGNOSIS — R0789 Other chest pain: Secondary | ICD-10-CM | POA: Insufficient documentation

## 2013-02-27 NOTE — ED Provider Notes (Signed)
  This was a shared visit with a mid-level provided (NP or PA).  Throughout the patient's course I was available for consultation/collaboration.  I saw the ECG (if appropriate), relevant labs and studies - I agree with the interpretation.  EKG Interpretation    Date/Time:  Monday February 26 2013 06:17:38 EST Ventricular Rate:  89 PR Interval:  148 QRS Duration: 149 QT Interval:  408 QTC Calculation: 496 R Axis:   -76 Text Interpretation:  Sinus or ectopic atrial rhythm Probable left atrial enlargement IVCD, consider atypical RBBB Left ventricular hypertrophy Inferior infarct, old Anterior infarct, old Baseline wander in lead(s) I II aVR aVL aVF V1 V3 V6 Artifact No significant change since last tracing Abnormal ekg Confirmed by Gerhard Munch  MD [phone] on 02/26/2013 8:51:02 AM             On my exam the patient was in no distress.  However, she looked quite uncomfortable. I demonstrated the x-ray to the patient and her family members.  With concern for recurrent pneumonia she received antibiotics for this, was admitted for further evaluation and management.      Gerhard Munch, MD 02/27/13 [phone]

## 2013-04-05 DIAGNOSIS — J45909 Unspecified asthma, uncomplicated: Secondary | ICD-10-CM

## 2013-04-05 HISTORY — DX: Unspecified asthma, uncomplicated: J45.909

## 2013-09-04 ENCOUNTER — Encounter: Payer: Self-pay | Admitting: Family Medicine

## 2013-09-04 DIAGNOSIS — F32A Depression, unspecified: Secondary | ICD-10-CM | POA: Insufficient documentation

## 2013-09-04 DIAGNOSIS — I10 Essential (primary) hypertension: Secondary | ICD-10-CM | POA: Insufficient documentation

## 2013-09-04 DIAGNOSIS — K219 Gastro-esophageal reflux disease without esophagitis: Secondary | ICD-10-CM | POA: Insufficient documentation

## 2013-09-04 DIAGNOSIS — E785 Hyperlipidemia, unspecified: Secondary | ICD-10-CM | POA: Insufficient documentation

## 2013-09-04 DIAGNOSIS — J45909 Unspecified asthma, uncomplicated: Secondary | ICD-10-CM | POA: Insufficient documentation

## 2013-09-04 DIAGNOSIS — E119 Type 2 diabetes mellitus without complications: Secondary | ICD-10-CM

## 2013-09-04 DIAGNOSIS — E782 Mixed hyperlipidemia: Secondary | ICD-10-CM | POA: Insufficient documentation

## 2013-09-04 DIAGNOSIS — F329 Major depressive disorder, single episode, unspecified: Secondary | ICD-10-CM | POA: Insufficient documentation

## 2013-09-04 DIAGNOSIS — F419 Anxiety disorder, unspecified: Secondary | ICD-10-CM | POA: Insufficient documentation

## 2013-09-04 DIAGNOSIS — E1169 Type 2 diabetes mellitus with other specified complication: Secondary | ICD-10-CM | POA: Insufficient documentation

## 2013-09-19 ENCOUNTER — Ambulatory Visit (INDEPENDENT_AMBULATORY_CARE_PROVIDER_SITE_OTHER): Payer: 59 | Admitting: Physician Assistant

## 2013-09-19 ENCOUNTER — Encounter: Payer: Self-pay | Admitting: Physician Assistant

## 2013-09-19 VITALS — BP 176/94 | HR 64 | Temp 97.6°F | Resp 18 | Ht 62.0 in | Wt 242.0 lb

## 2013-09-19 DIAGNOSIS — K219 Gastro-esophageal reflux disease without esophagitis: Secondary | ICD-10-CM

## 2013-09-19 DIAGNOSIS — F411 Generalized anxiety disorder: Secondary | ICD-10-CM

## 2013-09-19 DIAGNOSIS — I10 Essential (primary) hypertension: Secondary | ICD-10-CM

## 2013-09-19 DIAGNOSIS — F32A Depression, unspecified: Secondary | ICD-10-CM

## 2013-09-19 DIAGNOSIS — F3289 Other specified depressive episodes: Secondary | ICD-10-CM

## 2013-09-19 DIAGNOSIS — E785 Hyperlipidemia, unspecified: Secondary | ICD-10-CM

## 2013-09-19 DIAGNOSIS — E119 Type 2 diabetes mellitus without complications: Secondary | ICD-10-CM

## 2013-09-19 DIAGNOSIS — F419 Anxiety disorder, unspecified: Secondary | ICD-10-CM

## 2013-09-19 DIAGNOSIS — G4700 Insomnia, unspecified: Secondary | ICD-10-CM

## 2013-09-19 DIAGNOSIS — J45909 Unspecified asthma, uncomplicated: Secondary | ICD-10-CM

## 2013-09-19 DIAGNOSIS — F329 Major depressive disorder, single episode, unspecified: Secondary | ICD-10-CM

## 2013-09-19 MED ORDER — ZOLPIDEM TARTRATE 10 MG PO TABS: 10.0000 mg | ORAL_TABLET | Freq: Every evening | ORAL | Status: DC | PRN

## 2013-09-19 MED ORDER — OMEPRAZOLE MAGNESIUM 20 MG PO TBEC: 20.0000 mg | DELAYED_RELEASE_TABLET | Freq: Every day | ORAL | Status: DC

## 2013-09-19 MED ORDER — ALBUTEROL SULFATE HFA 108 (90 BASE) MCG/ACT IN AERS: 2.0000 | INHALATION_SPRAY | RESPIRATORY_TRACT | Status: DC | PRN

## 2013-09-19 MED ORDER — LORAZEPAM 1 MG PO TABS: 1.0000 mg | ORAL_TABLET | Freq: Two times a day (BID) | ORAL | Status: DC

## 2013-09-19 MED ORDER — SERTRALINE HCL 50 MG PO TABS: 50.0000 mg | ORAL_TABLET | Freq: Every day | ORAL | Status: DC

## 2013-09-19 MED ORDER — METFORMIN HCL 500 MG PO TABS: 500.0000 mg | ORAL_TABLET | Freq: Two times a day (BID) | ORAL | Status: DC

## 2013-09-19 MED ORDER — ROSUVASTATIN CALCIUM 40 MG PO TABS: 40.0000 mg | ORAL_TABLET | Freq: Every day | ORAL | Status: DC

## 2013-09-19 MED ORDER — LISINOPRIL-HYDROCHLOROTHIAZIDE 10-12.5 MG PO TABS: 1.0000 | ORAL_TABLET | Freq: Every day | ORAL | Status: DC

## 2013-09-19 NOTE — Progress Notes (Signed)
Patient ID: Junius Finneratricia A Milos MRN: [id number], DOB: 08/26/1960, 53 y.o. Date of Encounter: @DATE @  Chief Complaint:  Chief Complaint  Patient presents with  . Annual Exam    new patient, has been out of most meds for a month    HPI: 53 y.o. year old white female  presents as a new patient to establish care here today.  She says that she was going to [facility] on [address] in [place]. Says that she was going there for 30 years. Says that they actually dismissed her (but does not state the reason why).  Says that her last office visit and last labs there were about 3 months ago. Says she went there for office visits every 3 months.  However says that she is out of all of her medications except for her diabetes and blood pressure medicines.  States that she was seeing no other medical providers except for that family practice. Has seen no gynecologist. Has had no mammogram or colonoscopy or other preventive care.   Past Medical History  Diagnosis Date  . Memory impairment   . Allergy   . Asthma 2015  . Anxiety 2013  . Depression 2013  . Diabetes mellitus 2004  . GERD (gastroesophageal reflux disease) 2013  . Hypertension 1992  . Hyperlipidemia 1992     Home Meds: Outpatient Prescriptions Prior to Visit  Medication Sig Dispense Refill  . aspirin 81 MG chewable tablet Chew 81 mg by mouth daily.      . Nutritional Supplements (ESTROVEN PO) Take 1 tablet by mouth at bedtime. OTC      . sertraline (ZOLOFT) 100 MG tablet Take 200 mg by mouth daily.      . ALBUTEROL SULFATE IN Inhale into the lungs.      Marland Kitchen. lisinopril-hydrochlorothiazide (PRINZIDE,ZESTORETIC) 10-12.5 MG per tablet Take 1 tablet by mouth daily.      Marland Kitchen. LORazepam (ATIVAN) 1 MG tablet Take 1 mg by mouth 2 (two) times daily.      . metFORMIN (GLUCOPHAGE) 500 MG tablet Take 500 mg by mouth 2 (two) times daily with a meal.      . omeprazole (PRILOSEC OTC) 20 MG tablet Take 20 mg by mouth daily.       . rosuvastatin (CRESTOR) 40 MG tablet Take 40 mg by mouth daily.      Marland Kitchen. zolpidem (AMBIEN) 10 MG tablet Take 10 mg by mouth at bedtime as needed for sleep.      Marland Kitchen. azithromycin (ZITHROMAX) 250 MG tablet One tab PO daily  7 tablet  0  . GuaiFENesin (MUCINEX PO) Take 2 tablets by mouth 2 (two) times daily as needed (for congestion).      Marland Kitchen. HYDROcodone-homatropine (HYCODAN) 5-1.5 MG/5ML syrup Take 5 mLs by mouth every 6 (six) hours as needed for cough.  120 mL  0  . triamcinolone cream (KENALOG) 0.1 % Apply 1 application topically 2 (two) times daily.       No facility-administered medications prior to visit.    Allergies:  Allergies  Allergen Reactions  . Contrast Media [Iodinated Diagnostic Agents] Anaphylaxis  . Codeine Nausea And Vomiting    History   Social History  . Marital Status: Married    Spouse Name: N/A    Number of Children: N/A  . Years of Education: N/A   Occupational History  . Not on file.   Social History Main Topics  . Smoking status: Never Smoker   . Smokeless tobacco: Never  Used  . Alcohol Use: No  . Drug Use: No  . Sexual Activity: Yes   Other Topics Concern  . Not on file   Social History Narrative   Married.    Lives with husband and youngest son, who is 53 y/o.   Has 4 children.   Does not work outside of home.    No formal exercise. Only activity around the house.   Tries to be careful with Diabetic diet.    Family History  Problem Relation Age of Onset  . Cancer Father     Lymphoma, Leukemia  . Depression Father   . Cancer Maternal Grandfather   . Cancer Paternal Grandmother   . Diabetes Paternal Grandfather   . Kidney disease Paternal Grandfather      Review of Systems:  See HPI for pertinent ROS. All other ROS negative.    Physical Exam: Blood pressure 176/94, pulse 64, temperature 97.6 F (36.4 C), temperature source Oral, resp. rate 18, height 5\' 2"  (1.575 m), weight 242 lb (109.77 kg)., Body mass index is 44.25  kg/(m^2). General: Obese WF. Appears in no acute distress. Neck: Supple. No thyromegaly. No lymphadenopathy. No carotid bruit.  Lungs: Clear bilaterally to auscultation without wheezes, rales, or rhonchi. Breathing is unlabored. Heart: RRR with S1 S2. No murmurs, rubs, or gallops. Abdomen: Soft, non-tender, non-distended with normoactive bowel sounds. No hepatomegaly. No rebound/guarding. No obvious abdominal masses. Musculoskeletal:  Strength and tone normal for age. Extremities/Skin: Warm and dry. Neuro: Alert and oriented X 3. Moves all extremities spontaneously. Gait is normal. CNII-XII grossly in tact. Psych:  Responds to questions appropriately with a normal affect. Diabetic Foot Exam: Inspection is normal.  Sensation is normal and intact.  1+ bilateral posterior tibial pulses and dorsalis pedis pulses.      ASSESSMENT AND PLAN:  53 y.o. year old female with  1. Diabetes  - COMPLETE METABOLIC PANEL WITH GFR - Hemoglobin A1c - metFORMIN (GLUCOPHAGE) 500 MG tablet; Take 1 tablet (500 mg total) by mouth 2 (two) times daily with a meal.  Dispense: 60 tablet; Refill: 5  On ACE Inh On statin Will discuss ASA at next OV  Will need MicroAlbumin--?date of last one  She reports that she does have annual diabetic eye exam-had this recently Will enter Diabetic Foot Exam into Quality Metrics today--09/19/2013  2. Hyperlipidemia  - COMPLETE METABOLIC PANEL WITH GFR - rosuvastatin (CRESTOR) 40 MG tablet; Take 1 tablet (40 mg total) by mouth daily.  Dispense: 30 tablet; Refill: 5  3. Hypertension  - COMPLETE METABOLIC PANEL WITH GFR - lisinopril-hydrochlorothiazide (PRINZIDE,ZESTORETIC) 10-12.5 MG per tablet; Take 1 tablet by mouth daily.  Dispense: 30 tablet; Refill: 5  4. Anxiety She was on Zoloft 200 mg.  Need to start this back at low dose at 50 mg for couple weeks. Then can increase to 100 mg and then slowly will titrate back up as needed. - sertraline (ZOLOFT) 50 MG tablet;  Take 1 tablet (50 mg total) by mouth daily.  Dispense: 30 tablet; Refill: 0 - LORazepam (ATIVAN) 1 MG tablet; Take 1 tablet (1 mg total) by mouth 2 (two) times daily.  Dispense: 60 tablet; Refill: 2  5. Depression See # 4 above. - sertraline (ZOLOFT) 50 MG tablet; Take 1 tablet (50 mg total) by mouth daily.  Dispense: 30 tablet; Refill: 0  6. Insomnia Reports that she has to take Ambien every night in order to get adequate sleep. This works well for her and causes no adverse  effects. - zolpidem (AMBIEN) 10 MG tablet; Take 1 tablet (10 mg total) by mouth at bedtime as needed for sleep.  Dispense: 30 tablet; Refill: 2  7. GERD (gastroesophageal reflux disease) Symptoms are controlled with omeprazole daily. - omeprazole (PRILOSEC OTC) 20 MG tablet; Take 1 tablet (20 mg total) by mouth daily.  Dispense: 30 tablet; Refill: 5  8. Asthma - albuterol (PROVENTIL HFA;VENTOLIN HFA) 108 (90 BASE) MCG/ACT inhaler; Inhale 2 puffs into the lungs every 4 (four) hours as needed.  Dispense: 1 Inhaler; Refill: 5  9. Memory Impairment Asked her about this as it was documented in her history. She says that she has had CT scan and evaluation of this.--I reviewed in Epic--2013--had MRI Brain--Findings c/w "Chronic Microvascular Ischemia"   Will check CMET and A1C today. Will send refills on current meds. She will schedule f/u OV as a CPE--for early morning so she can come fasting--in 6 weeks (will be back on Crestor for 6 weeks at time of that lab)  Signed, Frazier Richards, [place], [facility] 09/19/2013 2:48 PM

## 2013-09-19 NOTE — Addendum Note (Signed)
Addended by: Allayne ButcherIXON, MARY on: 09/19/2013 03:03 PM   Modules accepted: Level of Service

## 2013-09-20 ENCOUNTER — Other Ambulatory Visit: Payer: Self-pay | Admitting: *Deleted

## 2013-09-20 DIAGNOSIS — R945 Abnormal results of liver function studies: Principal | ICD-10-CM

## 2013-09-20 DIAGNOSIS — R7989 Other specified abnormal findings of blood chemistry: Secondary | ICD-10-CM

## 2013-09-20 LAB — COMPLETE METABOLIC PANEL WITH GFR
ALT: 64 U/L — ABNORMAL HIGH (ref 0–35)
AST: 79 U/L — ABNORMAL HIGH (ref 0–37)
Albumin: 4 g/dL (ref 3.5–5.2)
Alkaline Phosphatase: 70 U/L (ref 39–117)
BUN: 9 mg/dL (ref 6–23)
CALCIUM: 10.6 mg/dL — AB (ref 8.4–10.5)
CHLORIDE: 100 meq/L (ref 96–112)
CO2: 28 meq/L (ref 19–32)
CREATININE: 0.68 mg/dL (ref 0.50–1.10)
GFR, Est Non African American: >89 mL/min
Glucose, Bld: 185 mg/dL — ABNORMAL HIGH (ref 70–99)
Potassium: 4.4 mEq/L (ref 3.5–5.3)
Sodium: 140 mEq/L (ref 135–145)
Total Bilirubin: 0.4 mg/dL (ref 0.2–1.2)
Total Protein: 7.3 g/dL (ref 6.0–8.3)

## 2013-09-20 LAB — HEMOGLOBIN A1C
HEMOGLOBIN A1C: 8.9 % — AB (ref <5.7)
MEAN PLASMA GLUCOSE: 209 mg/dL — AB (ref <117)

## 2013-09-20 MED ORDER — PIOGLITAZONE HCL 45 MG PO TABS: 45.0000 mg | ORAL_TABLET | Freq: Every day | ORAL | Status: DC

## 2013-09-20 MED ORDER — METFORMIN HCL 1000 MG PO TABS
1000.0000 mg | ORAL_TABLET | Freq: Two times a day (BID) | ORAL | Status: DC
Start: 2013-09-20 — End: 2014-03-11

## 2013-10-22 ENCOUNTER — Other Ambulatory Visit: Payer: Self-pay | Admitting: Physician Assistant

## 2013-10-22 NOTE — Telephone Encounter (Signed)
Medication refilled per protocol. 

## 2013-11-05 ENCOUNTER — Ambulatory Visit (INDEPENDENT_AMBULATORY_CARE_PROVIDER_SITE_OTHER): Payer: 59 | Admitting: Physician Assistant

## 2013-11-05 ENCOUNTER — Encounter: Payer: Self-pay | Admitting: Physician Assistant

## 2013-11-05 VITALS — BP 130/78 | HR 68 | Resp 18 | Ht 61.5 in | Wt 238.0 lb

## 2013-11-05 DIAGNOSIS — E119 Type 2 diabetes mellitus without complications: Secondary | ICD-10-CM

## 2013-11-05 DIAGNOSIS — R945 Abnormal results of liver function studies: Secondary | ICD-10-CM

## 2013-11-05 DIAGNOSIS — R7989 Other specified abnormal findings of blood chemistry: Secondary | ICD-10-CM

## 2013-11-05 DIAGNOSIS — F32A Depression, unspecified: Secondary | ICD-10-CM

## 2013-11-05 DIAGNOSIS — F3289 Other specified depressive episodes: Secondary | ICD-10-CM

## 2013-11-05 DIAGNOSIS — Z0000 Encounter for general adult medical examination without abnormal findings: Secondary | ICD-10-CM

## 2013-11-05 DIAGNOSIS — F411 Generalized anxiety disorder: Secondary | ICD-10-CM

## 2013-11-05 DIAGNOSIS — F329 Major depressive disorder, single episode, unspecified: Secondary | ICD-10-CM

## 2013-11-05 DIAGNOSIS — J45909 Unspecified asthma, uncomplicated: Secondary | ICD-10-CM

## 2013-11-05 DIAGNOSIS — E1165 Type 2 diabetes mellitus with hyperglycemia: Secondary | ICD-10-CM

## 2013-11-05 DIAGNOSIS — E785 Hyperlipidemia, unspecified: Secondary | ICD-10-CM

## 2013-11-05 DIAGNOSIS — F419 Anxiety disorder, unspecified: Secondary | ICD-10-CM

## 2013-11-05 DIAGNOSIS — K219 Gastro-esophageal reflux disease without esophagitis: Secondary | ICD-10-CM

## 2013-11-05 DIAGNOSIS — I10 Essential (primary) hypertension: Secondary | ICD-10-CM

## 2013-11-05 DIAGNOSIS — Z23 Encounter for immunization: Secondary | ICD-10-CM

## 2013-11-05 DIAGNOSIS — J4520 Mild intermittent asthma, uncomplicated: Secondary | ICD-10-CM

## 2013-11-05 LAB — MICROALBUMIN, URINE: Microalb, Ur: 2.42 mg/dL — ABNORMAL HIGH (ref 0.00–1.89)

## 2013-11-05 LAB — COMPLETE METABOLIC PANEL WITH GFR
ALBUMIN: 4.1 g/dL (ref 3.5–5.2)
ALK PHOS: 64 U/L (ref 39–117)
ALT: 32 U/L (ref 0–35)
AST: 28 U/L (ref 0–37)
BUN: 10 mg/dL (ref 6–23)
CO2: 30 mEq/L (ref 19–32)
Calcium: 9.3 mg/dL (ref 8.4–10.5)
Chloride: 103 mEq/L (ref 96–112)
Creat: 0.64 mg/dL (ref 0.50–1.10)
GFR, Est African American: >89 mL/min
GFR, Est Non African American: >89 mL/min
Glucose, Bld: 151 mg/dL — ABNORMAL HIGH (ref 70–99)
POTASSIUM: 4.1 meq/L (ref 3.5–5.3)
Sodium: 140 mEq/L (ref 135–145)
Total Bilirubin: 0.6 mg/dL (ref 0.2–1.2)
Total Protein: 6.8 g/dL (ref 6.0–8.3)

## 2013-11-05 LAB — LIPID PANEL
Cholesterol: 165 mg/dL (ref 0–200)
HDL: 60 mg/dL (ref >39)
LDL Cholesterol: 64 mg/dL (ref 0–99)
TRIGLYCERIDES: 205 mg/dL — AB (ref <150)
Total CHOL/HDL Ratio: 2.8 Ratio
VLDL: 41 mg/dL — AB (ref 0–40)

## 2013-11-05 LAB — CBC WITH DIFFERENTIAL/PLATELET
BASOS ABS: 0.1 10*3/uL (ref 0.0–0.1)
Basophils Relative: 1 % (ref 0–1)
Eosinophils Absolute: 0.3 10*3/uL (ref 0.0–0.7)
Eosinophils Relative: 5 % (ref 0–5)
HCT: 41.6 % (ref 36.0–46.0)
Hemoglobin: 14.1 g/dL (ref 12.0–15.0)
Lymphocytes Relative: 43 % (ref 12–46)
Lymphs Abs: 2.6 10*3/uL (ref 0.7–4.0)
MCH: 28.1 pg (ref 26.0–34.0)
MCHC: 33.9 g/dL (ref 30.0–36.0)
MCV: 82.9 fL (ref 78.0–100.0)
Monocytes Absolute: 0.4 10*3/uL (ref 0.1–1.0)
Monocytes Relative: 6 % (ref 3–12)
NEUTROS ABS: 2.7 10*3/uL (ref 1.7–7.7)
NEUTROS PCT: 45 % (ref 43–77)
Platelets: 232 10*3/uL (ref 150–400)
RBC: 5.02 MIL/uL (ref 3.87–5.11)
RDW: 15.5 % (ref 11.5–15.5)
WBC: 6.1 10*3/uL (ref 4.0–10.5)

## 2013-11-05 LAB — TSH: TSH: 1.083 u[IU]/mL (ref 0.350–4.500)

## 2013-11-05 MED ORDER — ASPIRIN 81 MG PO TABS: 81.0000 mg | ORAL_TABLET | Freq: Every day | ORAL | Status: AC

## 2013-11-05 NOTE — Progress Notes (Signed)
[name] MRN: [id number], DOB: 05/11/1960, 53 y.o. Date of Encounter: @DATE @  Chief Complaint:  Chief Complaint  Patient presents with  . Annual Exam    HPI: 53 y.o. year old white female  Presents for CPE.   She has only seen me for one office visit prior to today.  That office visit was on 09/19/13. At that visit she was seen as a new patient to establish care. She said that she had been going to [facility] on [address] in [place]. Said that she had been going there for 30 years. York Spaniel that they actually dismissed her (but did not state the reason why).  [place] Spaniel that her last office visit and last labs there were about 3 months prior to her visit here on 09/19/2013. She said that she went there for office visits every 3 months.  However said that she was out of all of her medications except for her diabetes and blood pressure medicines.  Stated that she was seeing no other medical providers except for that family practice. Has seen no gynecologist. Has had no mammogram or colonoscopy or other preventive care.  At that initial visit, she was not fasting so could not check lipid panel. At that visit we just checked CMET and A1c. CMET revealed elevated LFTs. Also showed A1c elevated at 8.9. At that time I said to stop the Crestor and also to find out if she was taking any Tylenol or drinking any alcohol. Today she says that she does not remember getting a message to stop the Crestor. Says that she is still taking Crestor. Says that she does not use any Tylenol and does not drink any type of alcohol. At the time of that lab result also said to increase metformin from 500 twice a day to 1000 mg twice a day. She says that she did make this change. Also said to add Actos 45 mg daily. She says that she did add this medication as well. She says that when she checks her blood sugar she always checks it fasting morning. Says that she checks it about once a  week. Says that she gets about 140-150. I asked if these readings were prior to these medication changes or if these are the readings since the new medications. She says that she's not sure. She did not bring in her blood sugar log with her today.  She has no complaints today.   Past Medical History  Diagnosis Date  . Memory impairment   . Allergy   . Asthma 2015  . Anxiety 2013  . Depression 2013  . Diabetes mellitus 2004  . GERD (gastroesophageal reflux disease) 2013  . Hypertension 1992  . Hyperlipidemia 1992     Home Meds: Outpatient Prescriptions Prior to Visit  Medication Sig Dispense Refill  . albuterol (PROVENTIL HFA;VENTOLIN HFA) 108 (90 BASE) MCG/ACT inhaler Inhale 2 puffs into the lungs every 4 (four) hours as needed.  1 Inhaler  5  . aspirin 81 MG chewable tablet Chew 81 mg by mouth daily.      Marland Kitchen lisinopril-hydrochlorothiazide (PRINZIDE,ZESTORETIC) 10-12.5 MG per tablet Take 1 tablet by mouth daily.  30 tablet  5  . LORazepam (ATIVAN) 1 MG tablet Take 1 tablet (1 mg total) by mouth 2 (two) times daily.  60 tablet  2  . metFORMIN (GLUCOPHAGE) 1000 MG tablet Take 1 tablet (1,000 mg total) by mouth 2 (two) times daily with a meal.  180 tablet  3  . Nutritional Supplements (ESTROVEN PO) Take 1 tablet by mouth at bedtime. OTC      . omeprazole (PRILOSEC OTC) 20 MG tablet Take 1 tablet (20 mg total) by mouth daily.  30 tablet  5  . pioglitazone (ACTOS) 45 MG tablet Take 1 tablet (45 mg total) by mouth daily.  90 tablet  1  . sertraline (ZOLOFT) 50 MG tablet TAKE ONE TABLET BY MOUTH ONCE DAILY  30 tablet  0  . zolpidem (AMBIEN) 10 MG tablet Take 1 tablet (10 mg total) by mouth at bedtime as needed for sleep.  30 tablet  2  . sertraline (ZOLOFT) 100 MG tablet Take 200 mg by mouth daily.       No facility-administered medications prior to visit.    Allergies:  Allergies  Allergen Reactions  . Contrast Media [Iodinated Diagnostic Agents] Anaphylaxis  . Codeine Nausea And  Vomiting    History   Social History  . Marital Status: Married    Spouse Name: N/A    Number of Children: N/A  . Years of Education: N/A   Occupational History  . Not on file.   Social History Main Topics  . Smoking status: Never Smoker   . Smokeless tobacco: Never Used  . Alcohol Use: No  . Drug Use: No  . Sexual Activity: Yes   Other Topics Concern  . Not on file   Social History Narrative   Married.    Lives with husband and youngest son, who is 29 y/o.   Has 4 children.   Does not work outside of home.    No formal exercise. Only activity around the house.   Tries to be careful with Diabetic diet.    Family History  Problem Relation Age of Onset  . Cancer Father     Lymphoma, Leukemia  . Depression Father   . Cancer Maternal Grandfather   . Cancer Paternal Grandmother   . Diabetes Paternal Grandfather   . Kidney disease Paternal Grandfather      Review of Systems:  See HPI for pertinent ROS. All other ROS negative.    Physical Exam: Blood pressure 130/78, pulse 68, resp. rate 18, height 5' 1.5" (1.562 m), weight 238 lb (107.956 kg)., Body mass index is 44.25 kg/(m^2). General: Obese WF. Appears in no acute distress. Neck: Supple. No thyromegaly. No lymphadenopathy. No carotid bruit.  Lungs: Clear bilaterally to auscultation without wheezes, rales, or rhonchi. Breathing is unlabored. Heart: RRR with S1 S2. No murmurs, rubs, or gallops. Abdomen: Soft, non-tender, non-distended with normoactive bowel sounds. No hepatomegaly. No rebound/guarding. No obvious abdominal masses. Musculoskeletal:  Strength and tone normal for age. Extremities/Skin: Warm and dry. Neuro: Alert and oriented X 3. Moves all extremities spontaneously. Gait is normal. CNII-XII grossly in tact. Psych:  Responds to questions appropriately with a normal affect. Diabetic Foot Exam: Inspection is normal.  Sensation is normal and intact.  1+ bilateral posterior tibial pulses and dorsalis  pedis pulses.      ASSESSMENT AND PLAN:  53 y.o. year old female with   1. Visit for preventive health examination  A. Screening Labs:  She is fasting today.  - CBC with Differential - COMPLETE METABOLIC PANEL WITH GFR - Lipid panel - TSH - Vit D  25 hydroxy (rtn osteoporosis monitoring)  B. Screening Colonoscopy: She reports that she has never had a screening colonoscopy. I discussed risks versus benefits of having screening colonoscopy. She is agreeable for me to go ahead and  schedule referral to GI for her to schedule this. - Ambulatory referral to Gastroenterology  C. Pelvic Exam, Pap Smear, Breast Exam, Mammogram SHE WILL SCHEDULE F/U OV WITH ME IN THE NEXT WEEK SO WE CAN UPDATE THESE.  WE HAD TOO MUCH TO COVER TODAY TO GET THIS DONE  D. Immunizations: Tetanus:  She states that she has had no tetanus vaccine in the past 10 years. She is agreeable for us to update this today. Pneumonia Vaccine:  She states that she has never had a pneumonia vaccine. Given her diabetes, she does need to have Pneumovax 23. She is agreeable to receive this today. - Pneumococcal polysaccharide vaccine 23-valent greater than or equal to 2yo subcutaneous/IM - Tdap vaccine greater than or equal to 7yo IM   2. Type 2 diabetes mellitus with hyperglycemia - Microalbumin, urine - aspirin 81 MG tablet; Take 1 tablet (81 mg total) by mouth daily.  Dispense: 30 tablet; Refill: 11  On ACE inhibitor On statin--NOW ON HOLD SECONDARY TO LFTS--will f/u  Add ASA 81 mg daily now. She reports that she has no h/u PUD or any h/o any bleed.  She reports that she does have annual diabetic eye exam-had this recently Diabetic Foot Exam entered into Quality Metrics 09/19/2013   3. Hyperlipidemia Labs on 09/19/13 revealed elevated LFTs. At that time I said to stop the Crestor but she says she does not recall that message. She is still taking Crestor. I told her today to stop the Crestor and wrote this on her AVS.    -CMET - Lipid panel  4. Elevated LFTs - COMPLETE METABOLIC PANEL WITH GFR   6. Essential hypertension Blood pressure at goal. On ACE inhibitor. BMET normal at lab 09/19/13.  7. Asthma, mild intermittent, uncomplicated  8. Gastroesophageal reflux disease, esophagitis presence not specified  4. Anxiety She was on Zoloft 200 mg.  At OV 09/19/2013 discussed: Need to start this back at low dose at 50 mg for couple weeks. Then can increase to 100 mg and then slowly will titrate back up as needed. At OV 09/19/13 Rxed: - sertraline (ZOLOFT) 50 MG tablet; Take 1 tablet (50 mg total) by mouth daily.  Dispense: 30 tablet; Refill: 0 AT OV 09/19/13 Rxed - LORazepam (ATIVAN) 1 MG tablet; Take 1 tablet (1 mg total) by mouth 2 (two) times daily.  Dispense: 60 tablet; Refill: 2  5. Depression See # 4 above. - sertraline (ZOLOFT) 50 MG tablet; Take 1 tablet (50 mg total) by mouth daily.  Dispense: 30 tablet; Refill: 0  6. Insomnia Reports that she has to take Ambien every night in order to get adequate sleep. This works well for her and causes no adverse effects. - zolpidem (AMBIEN) 10 MG tablet; Take 1 tablet (10 mg total) by mouth at bedtime as needed for sleep.  Dispense: 30 tablet; Refill: 2  7. GERD (gastroesophageal reflux disease) Symptoms are controlled with omeprazole daily. - omeprazole (PRILOSEC OTC) 20 MG tablet; Take 1 tablet (20 mg total) by mouth daily.  Dispense: 30 tablet; Refill: 5  8. Asthma - albuterol (PROVENTIL HFA;VENTOLIN HFA) 108 (90 BASE) MCG/ACT inhaler; Inhale 2 puffs into the lungs every 4 (four) hours as needed.  Dispense: 1 Inhaler; Refill: 5  9. Memory Impairment Asked her about this as it was documented in her history. She says that she has had CT scan and evaluation of this.--I reviewed in Epic--2013--had MRI Brain--Findings c/w "Chronic Microvascular Ischemia"   SHE IS TO SCHEDULE ANOTHER OV WITH  ME IN THE NEXT WEEK TO DO BREAST, PELVIC EXAM. DISCUSS PAP SMEAR,  MAMMOGRAM.  ALSO WILL MAKE SURE SHE HAS APPROPRIATE F/U OF LFTS AND THAT SHE DOES STOP THE CRESTOR.  ONCE ALL THIS GETS STABLE, WILL HAVE HER SCHEDULE ROUTINE OV Q 3 MONTHS.   Murray Hodgkins Wink, [place], [facility] 11/05/2013 2:44 PM

## 2013-11-06 LAB — VITAMIN D 25 HYDROXY (VIT D DEFICIENCY, FRACTURES): VIT D 25 HYDROXY: 23 ng/mL — AB (ref 30–89)

## 2013-11-08 ENCOUNTER — Other Ambulatory Visit: Payer: Self-pay | Admitting: Physician Assistant

## 2013-11-08 DIAGNOSIS — R7989 Other specified abnormal findings of blood chemistry: Secondary | ICD-10-CM

## 2013-11-08 DIAGNOSIS — R945 Abnormal results of liver function studies: Secondary | ICD-10-CM

## 2013-11-12 ENCOUNTER — Other Ambulatory Visit: Payer: 59 | Admitting: Physician Assistant

## 2013-11-15 ENCOUNTER — Other Ambulatory Visit: Payer: 59 | Admitting: Physician Assistant

## 2013-11-21 ENCOUNTER — Encounter: Payer: Self-pay | Admitting: Physician Assistant

## 2013-11-21 ENCOUNTER — Ambulatory Visit (INDEPENDENT_AMBULATORY_CARE_PROVIDER_SITE_OTHER): Payer: 59 | Admitting: Physician Assistant

## 2013-11-21 VITALS — BP 118/78 | HR 68 | Temp 97.8°F | Resp 16 | Ht 61.0 in | Wt 237.0 lb

## 2013-11-21 DIAGNOSIS — Z01419 Encounter for gynecological examination (general) (routine) without abnormal findings: Secondary | ICD-10-CM

## 2013-11-21 DIAGNOSIS — Z1239 Encounter for other screening for malignant neoplasm of breast: Secondary | ICD-10-CM

## 2013-11-21 DIAGNOSIS — I10 Essential (primary) hypertension: Secondary | ICD-10-CM

## 2013-11-21 MED ORDER — ROSUVASTATIN CALCIUM 40 MG PO TABS: 40.0000 mg | ORAL_TABLET | Freq: Every day | ORAL | Status: DC

## 2013-11-21 MED ORDER — LISINOPRIL-HYDROCHLOROTHIAZIDE 10-12.5 MG PO TABS: 1.0000 | ORAL_TABLET | Freq: Every day | ORAL | Status: DC

## 2013-11-21 NOTE — Progress Notes (Signed)
Patient ID: ELECTA STERRY MRN: [id number], DOB: 1961-01-21, 53 y.o. Date of Encounter: @DATE @  Chief Complaint:  Chief Complaint  Patient presents with  . Pap smear    HPI: 54 y.o. year old white female  Presents for Pap Smear, Breast Exam, Pelvic Exam.  Her last office visit here was 11/05/2013. That visit was for a complete physical exam but we had so much to discuss and get done that day that we did not have time to do Gyn exam. Therefore I asked her to return for another separate visit for [place] to do GYN exam. She was agreeable and presents today for this. She reports that her only GYN history was that in 2010 she had her "Uterus Scraped". Says that she has had no menstrual bleeding at all since 2010 and has had no type of vaginal bleeding since then. Says that this procedure was done because there was "Extra Tissue." I asked her about diagnosis of fibroids and she says she does not remember that term-- just remembers being told "extra tissue". She says that her last Pap smear and pelvic exam were done at that same time in 2010. Says her last mammogram was 5 or 6 years ago. She reports that she has had no other GYN history other than pregnancy and C-section.  She has only seen me for 2 office visits prior to today.  She had office visit on 09/19/13. At that visit she was seen as a new patient to establish care. She said that she had been going to [facility] on [facility] in [place]. Said that she had been going there for 30 years. York Spaniel that they actually dismissed her (but did not state the reason why).  York Spaniel that her last office visit and last labs there were about 3 months prior to her visit here on 09/19/2013. She said that she went there for office visits every 3 months.  However said that she was out of all of her medications except for her diabetes and blood pressure medicines.  Stated that she was seeing no other medical providers except for that family  practice. Has seen no gynecologist. Has had no mammogram or colonoscopy or other preventive care.  At that initial visit, she was not fasting so could not check lipid panel. At that visit we just checked CMET and A1c. CMET revealed elevated LFTs. Also showed A1c elevated at 8.9. At that time I said to stop the Crestor and also to find out if she was taking any Tylenol or drinking any alcohol.  At f/u OV 11/05/2013 she said that she did not remember getting a message to stop the Crestor. Said that she was still taking Crestor. Said that she does not use any Tylenol and does not drink any type of alcohol. At the time of that lab result also said to increase metformin from 500 twice a day to 1000 mg twice a day. She says that she did make this change. Also said to add Actos 45 mg daily. She says that she did add this medication as well. She says that when she checks her blood sugar she always checks it fasting morning. Says that she checks it about once a week. Says that she gets about 140-150. I asked if these readings were prior to these medication changes or if these are the readings since the new medications. She says that she's not sure. She did not bring in her blood sugar log with her today.  She has no complaints today.   Past Medical History  Diagnosis Date  . Memory impairment   . Allergy   . Asthma 2015  . Anxiety 2013  . Depression 2013  . Diabetes mellitus 2004  . GERD (gastroesophageal reflux disease) 2013  . Hypertension 1992  . Hyperlipidemia 1992     Home Meds: Outpatient Prescriptions Prior to Visit  Medication Sig Dispense Refill  . albuterol (PROVENTIL HFA;VENTOLIN HFA) 108 (90 BASE) MCG/ACT inhaler Inhale 2 puffs into the lungs every 4 (four) hours as needed.  1 Inhaler  5  . aspirin 81 MG tablet Take 1 tablet (81 mg total) by mouth daily.  30 tablet  11  . LORazepam (ATIVAN) 1 MG tablet Take 1 tablet (1 mg total) by mouth 2 (two) times daily.  60 tablet  2  .  metFORMIN (GLUCOPHAGE) 1000 MG tablet Take 1 tablet (1,000 mg total) by mouth 2 (two) times daily with a meal.  180 tablet  3  . Nutritional Supplements (ESTROVEN PO) Take 1 tablet by mouth at bedtime. OTC      . omeprazole (PRILOSEC OTC) 20 MG tablet Take 1 tablet (20 mg total) by mouth daily.  30 tablet  5  . pioglitazone (ACTOS) 45 MG tablet Take 1 tablet (45 mg total) by mouth daily.  90 tablet  1  . sertraline (ZOLOFT) 50 MG tablet TAKE ONE TABLET BY MOUTH ONCE DAILY  30 tablet  0  . zolpidem (AMBIEN) 10 MG tablet Take 1 tablet (10 mg total) by mouth at bedtime as needed for sleep.  30 tablet  2  . aspirin 81 MG chewable tablet Chew 81 mg by mouth daily.      Marland Kitchen lisinopril-hydrochlorothiazide (PRINZIDE,ZESTORETIC) 10-12.5 MG per tablet Take 1 tablet by mouth daily.  30 tablet  5  . sertraline (ZOLOFT) 100 MG tablet Take 200 mg by mouth daily.       No facility-administered medications prior to visit.    Allergies:  Allergies  Allergen Reactions  . Contrast Media [Iodinated Diagnostic Agents] Anaphylaxis  . Codeine Nausea And Vomiting    History   Social History  . Marital Status: Married    Spouse Name: N/A    Number of Children: N/A  . Years of Education: N/A   Occupational History  . Not on file.   Social History Main Topics  . Smoking status: Never Smoker   . Smokeless tobacco: Never Used  . Alcohol Use: No  . Drug Use: No  . Sexual Activity: Yes   Other Topics Concern  . Not on file   Social History Narrative   Married.    Lives with husband and youngest son, who is 91 y/o.   Has 4 children.   Does not work outside of home.    No formal exercise. Only activity around the house.   Tries to be careful with Diabetic diet.    Family History  Problem Relation Age of Onset  . Cancer Father     Lymphoma, Leukemia  . Depression Father   . Cancer Maternal Grandfather   . Cancer Paternal Grandmother   . Diabetes Paternal Grandfather   . Kidney disease Paternal  Grandfather      Review of Systems:  See HPI for pertinent ROS. All other ROS negative.    Physical Exam: Blood pressure 118/78, pulse 68, temperature 97.8 F (36.6 C), temperature source Oral, resp. rate 16, height 5\' 1"  (1.549 m), weight 237 lb (107.502 kg)., Body  mass index is 44.8 kg/(m^2). General: Obese WF. Appears in no acute distress. Neck: Supple. No thyromegaly. No lymphadenopathy. No carotid bruit. Lungs: Clear bilaterally to auscultation without wheezes, rales, or rhonchi. Breathing is unlabored. Heart: RRR with S1 S2. No murmurs, rubs, or gallops. Breast Exam: Breasts are normal bilaterally. Inspection is normal and breast are symmetrical normal bilaterally. Palpation is normal with no masses. No nipple discharge and no skin changes present. Pelvic Exam: External genitalia normal. Vaginal mucosa normal. Cervix normal. Bimanual exam is normal with no cervical motion tenderness. No adnexal mass. Uterus normal size. Abdomen: Soft, non-tender, non-distended with normoactive bowel sounds. No hepatomegaly. No rebound/guarding. No obvious abdominal masses. Musculoskeletal:  Strength and tone normal for age. Extremities/Skin: Warm and dry. Neuro: Alert and oriented X 3. Moves all extremities spontaneously. Gait is normal. CNII-XII grossly in tact. Psych:  Responds to questions appropriately with a normal affect. Diabetic Foot Exam: Inspection is normal.  Sensation is normal and intact.  1+ bilateral posterior tibial pulses and dorsalis pedis pulses.      ASSESSMENT AND PLAN:  53 y.o. year old female with   1. Encounter for screening breast examination She is overdue for mammogram. She is agreeable to go for followup mammogram. - MM Digital Screening; Future  2. Visit for pelvic exam - PAP, Thin Prep w/HPV rflx HPV Type 16/18  3. Elevated LFTS- Liver function tests were slightly elevated at her initial visit with me. However, followup liver function test on 11/05/13 were normal  and patient states that she was still taking Crestor at that time. We discussed this again today and she still says that she was definitely still taking Crestor at the visit 11/05/13. Therefore I have told her that she can resume the Crestor as this must not have been the cause of her elevated LFTs in the past as well if these were normal on 11/05/2013 on Crestor. She says that she will stay off of any type of alcohol and any type of Tylenol containing products. We'll make sure to recheck her LFTs at her followup visit in 3 months.  4. Anxiety / Depression In the history and reviewed that she had been on a higher dose of Zoloft in the past. Because she had then been off of medicine we had to restart it at a low dose of 50 mg. I discussed this with her today. She says that she is feeling fine on the 50 mg and feels that her mood is stable and well controlled at this dose.  She will schedule followup office visit in 3 months. Followup sooner if needed.    THE FOLLOWING WAS THE ASSESSMENT/PLAN FROM PRIOR OFFICE VISITS:  1. Visit for preventive health examination  A. Screening Labs:  She is fasting today.  - CBC with Differential - COMPLETE METABOLIC PANEL WITH GFR - Lipid panel - TSH - Vit D  25 hydroxy (rtn osteoporosis monitoring)  B. Screening Colonoscopy: She reports that she has never had a screening colonoscopy. I discussed risks versus benefits of having screening colonoscopy. She is agreeable for me to go ahead and schedule referral to GI for her to schedule this. - Ambulatory referral to Gastroenterology  C. Pelvic Exam, Pap Smear, Breast Exam, Mammogram SHE WILL SCHEDULE F/U OV WITH ME IN THE NEXT WEEK SO WE CAN UPDATE THESE.  WE HAD TOO MUCH TO COVER TODAY TO GET THIS DONE  D. Immunizations: Tetanus:  She states that she has had no tetanus vaccine in the past 10  years. She is agreeable for us to update this today. Pneumonia Vaccine:  She states that she has never had a  pneumonia vaccine. Given her diabetes, she does need to have Pneumovax 23. She is agreeable to receive this today. - Pneumococcal polysaccharide vaccine 23-valent greater than or equal to 2yo subcutaneous/IM - Tdap vaccine greater than or equal to 7yo IM   2. Type 2 diabetes mellitus with hyperglycemia - Microalbumin, urine - aspirin 81 MG tablet; Take 1 tablet (81 mg total) by mouth daily.  Dispense: 30 tablet; Refill: 11  On ACE inhibitor On statin--NOW ON HOLD SECONDARY TO LFTS--will f/u  Add ASA 81 mg daily now. She reports that she has no h/u PUD or any h/o any bleed.  She reports that she does have annual diabetic eye exam-had this recently Diabetic Foot Exam entered into [employer] 09/19/2013   3. Hyperlipidemia Labs on 09/19/13 revealed elevated LFTs. At that time I said to stop the Crestor but she says she does not recall that message. She is still taking Crestor. I told her today to stop the Crestor and wrote this on her AVS.  -CMET - Lipid panel  4. Elevated LFTs - COMPLETE METABOLIC PANEL WITH GFR   6. Essential hypertension Blood pressure at goal. On ACE inhibitor. BMET normal at lab 09/19/13.  7. Asthma, mild intermittent, uncomplicated  8. Gastroesophageal reflux disease, esophagitis presence not specified  4. Anxiety She was on Zoloft 200 mg.  At OV 09/19/2013 discussed: Need to start this back at low dose at 50 mg for couple weeks. Then can increase to 100 mg and then slowly will titrate back up as needed. At OV 09/19/13 Rxed: - sertraline (ZOLOFT) 50 MG tablet; Take 1 tablet (50 mg total) by mouth daily.  Dispense: 30 tablet; Refill: 0 AT OV 09/19/13 Rxed - LORazepam (ATIVAN) 1 MG tablet; Take 1 tablet (1 mg total) by mouth 2 (two) times daily.  Dispense: 60 tablet; Refill: 2  5. Depression See # 4 above. - sertraline (ZOLOFT) 50 MG tablet; Take 1 tablet (50 mg total) by mouth daily.  Dispense: 30 tablet; Refill: 0  6. Insomnia Reports that she has to take  Ambien every night in order to get adequate sleep. This works well for her and causes no adverse effects. - zolpidem (AMBIEN) 10 MG tablet; Take 1 tablet (10 mg total) by mouth at bedtime as needed for sleep.  Dispense: 30 tablet; Refill: 2  7. GERD (gastroesophageal reflux disease) Symptoms are controlled with omeprazole daily. - omeprazole (PRILOSEC OTC) 20 MG tablet; Take 1 tablet (20 mg total) by mouth daily.  Dispense: 30 tablet; Refill: 5  8. Asthma - albuterol (PROVENTIL HFA;VENTOLIN HFA) 108 (90 BASE) MCG/ACT inhaler; Inhale 2 puffs into the lungs every 4 (four) hours as needed.  Dispense: 1 Inhaler; Refill: 5  9. Memory Impairment Asked her about this as it was documented in her history. She says that she has had CT scan and evaluation of this.--I reviewed in Epic--2013--had MRI Brain--Findings c/w "Chronic Microvascular Ischemia"   SHE IS TO SCHEDULE ANOTHER OV WITH ME IN THE NEXT WEEK TO DO BREAST, PELVIC EXAM. DISCUSS PAP SMEAR, MAMMOGRAM.  ALSO WILL MAKE SURE SHE HAS APPROPRIATE F/U OF LFTS AND THAT SHE DOES STOP THE CRESTOR.  ONCE ALL THIS GETS STABLE, WILL HAVE HER SCHEDULE ROUTINE OV Q 3 MONTHS.   Murray HodgkinsSigned, Juan Kissoon Beth KerseyDixon, [place], [facility] 11/21/2013 2:38 PM

## 2013-11-23 LAB — PAP, THIN PREP W/HPV RFLX HPV TYPE 16/18: HPV DNA High Risk: NOT DETECTED

## 2013-11-26 ENCOUNTER — Encounter: Payer: Self-pay | Admitting: *Deleted

## 2013-11-29 ENCOUNTER — Ambulatory Visit
Admission: RE | Admit: 2013-11-29 | Discharge: 2013-11-29 | Disposition: A | Discharge status: Home / Self Care | Payer: 59 | Source: Ambulatory Visit | Attending: Physician Assistant | Admitting: Physician Assistant

## 2013-11-29 DIAGNOSIS — Z1239 Encounter for other screening for malignant neoplasm of breast: Secondary | ICD-10-CM

## 2013-12-06 ENCOUNTER — Encounter: Payer: Self-pay | Admitting: Physician Assistant

## 2013-12-24 ENCOUNTER — Other Ambulatory Visit: Payer: Self-pay | Admitting: Physician Assistant

## 2013-12-24 NOTE — Telephone Encounter (Signed)
Last RF 6/17 #60 + 2.  Last OV 11/17/13.  OK refill?

## 2013-12-25 ENCOUNTER — Other Ambulatory Visit: Payer: Self-pay | Admitting: *Deleted

## 2013-12-25 NOTE — Telephone Encounter (Signed)
Received fax requesting refill on Lorazepam.   Refill being reviewed under another note.

## 2013-12-25 NOTE — Telephone Encounter (Signed)
Refills called in 

## 2013-12-25 NOTE — Telephone Encounter (Signed)
Approved. May refill lorazepam for #60+2 additional refills.                 May refill Ambien #30+2 additional refills.

## 2014-01-31 ENCOUNTER — Telehealth: Payer: Self-pay | Admitting: Family Medicine

## 2014-01-31 NOTE — Telephone Encounter (Signed)
rec'd Medication Non adherence Therapy Advisory fpr Metformin and Pioglitazone.  Have call into patient.

## 2014-02-12 NOTE — Telephone Encounter (Signed)
Unable to reach patient.  She has OV 11/19.  Can discuss with her then.

## 2014-02-21 ENCOUNTER — Ambulatory Visit: Payer: 59 | Admitting: Physician Assistant

## 2014-02-26 ENCOUNTER — Telehealth: Payer: Self-pay | Admitting: Family Medicine

## 2014-02-26 NOTE — Telephone Encounter (Signed)
Have now received Medication Non adherence Therapy Advisory for Lisinopril/HCTZ.  Pt has appt 12/7.  Will discuss with her then.

## 2014-03-11 ENCOUNTER — Ambulatory Visit (INDEPENDENT_AMBULATORY_CARE_PROVIDER_SITE_OTHER): Payer: 59 | Admitting: Physician Assistant

## 2014-03-11 ENCOUNTER — Encounter: Payer: Self-pay | Admitting: Physician Assistant

## 2014-03-11 VITALS — BP 144/90 | HR 60 | Temp 97.8°F | Resp 18 | Wt 240.0 lb

## 2014-03-11 DIAGNOSIS — E785 Hyperlipidemia, unspecified: Secondary | ICD-10-CM

## 2014-03-11 DIAGNOSIS — J4520 Mild intermittent asthma, uncomplicated: Secondary | ICD-10-CM

## 2014-03-11 DIAGNOSIS — Z1211 Encounter for screening for malignant neoplasm of colon: Secondary | ICD-10-CM

## 2014-03-11 DIAGNOSIS — I10 Essential (primary) hypertension: Secondary | ICD-10-CM

## 2014-03-11 DIAGNOSIS — K219 Gastro-esophageal reflux disease without esophagitis: Secondary | ICD-10-CM

## 2014-03-11 DIAGNOSIS — Z1212 Encounter for screening for malignant neoplasm of rectum: Secondary | ICD-10-CM

## 2014-03-11 DIAGNOSIS — F32A Depression, unspecified: Secondary | ICD-10-CM

## 2014-03-11 DIAGNOSIS — Z23 Encounter for immunization: Secondary | ICD-10-CM

## 2014-03-11 DIAGNOSIS — F419 Anxiety disorder, unspecified: Secondary | ICD-10-CM

## 2014-03-11 DIAGNOSIS — E1165 Type 2 diabetes mellitus with hyperglycemia: Secondary | ICD-10-CM

## 2014-03-11 DIAGNOSIS — E559 Vitamin D deficiency, unspecified: Secondary | ICD-10-CM

## 2014-03-11 DIAGNOSIS — F329 Major depressive disorder, single episode, unspecified: Secondary | ICD-10-CM

## 2014-03-11 LAB — COMPLETE METABOLIC PANEL WITH GFR
ALBUMIN: 4.1 g/dL (ref 3.5–5.2)
ALK PHOS: 73 U/L (ref 39–117)
ALT: 26 U/L (ref 0–35)
AST: 21 U/L (ref 0–37)
BILIRUBIN TOTAL: 0.6 mg/dL (ref 0.2–1.2)
BUN: 10 mg/dL (ref 6–23)
CO2: 28 mEq/L (ref 19–32)
Calcium: 9.9 mg/dL (ref 8.4–10.5)
Chloride: 102 mEq/L (ref 96–112)
Creat: 0.64 mg/dL (ref 0.50–1.10)
GFR, Est African American: >89 mL/min
GFR, Est Non African American: >89 mL/min
Glucose, Bld: 173 mg/dL — ABNORMAL HIGH (ref 70–99)
POTASSIUM: 4.2 meq/L (ref 3.5–5.3)
Sodium: 140 mEq/L (ref 135–145)
TOTAL PROTEIN: 7.2 g/dL (ref 6.0–8.3)

## 2014-03-11 LAB — LIPID PANEL
CHOL/HDL RATIO: 2.8 ratio
Cholesterol: 173 mg/dL (ref 0–200)
HDL: 62 mg/dL (ref >39)
LDL Cholesterol: 73 mg/dL (ref 0–99)
Triglycerides: 191 mg/dL — ABNORMAL HIGH (ref <150)
VLDL: 38 mg/dL (ref 0–40)

## 2014-03-11 LAB — HEMOGLOBIN A1C
HEMOGLOBIN A1C: 7.4 % — AB (ref <5.7)
Mean Plasma Glucose: 166 mg/dL — ABNORMAL HIGH (ref <117)

## 2014-03-11 MED ORDER — METFORMIN HCL 1000 MG PO TABS: 1000.0000 mg | ORAL_TABLET | Freq: Two times a day (BID) | ORAL | Status: DC

## 2014-03-11 MED ORDER — VITAMIN D 50 MCG (2000 UT) PO CAPS: 1.0000 | ORAL_CAPSULE | Freq: Every day | ORAL | Status: DC

## 2014-03-11 MED ORDER — SERTRALINE HCL 50 MG PO TABS: 50.0000 mg | ORAL_TABLET | Freq: Every day | ORAL | Status: DC

## 2014-03-11 NOTE — Progress Notes (Signed)
Patient ID: Junius Finneratricia A Seibert MRN: [id number], DOB: 11/02/1960, 53 y.o. Date of Encounter: @DATE @  Chief Complaint:  Chief Complaint  Patient presents with  . 3 month check up    is fasting  . Medication Refill    metformin and zoloft  . Flu Vaccine    HPI: 53 y.o. year old white female  Presents for follow-up office visit.       She saw me on 09/19/13 as a new patient to establish care. She said that she had been going to [facility] on [address] in [place]. Said that she had been going there for 30 years. York SpanielSaid that they actually dismissed her (but did not state the reason why).  York SpanielSaid that her last office visit and last labs there were about 3 months prior to her visit here on 09/19/2013. She said that she went there for office visits every 3 months.  However said that she was out of all of her medications except for her diabetes and blood pressure medicines.  Stated that she was seeing no other medical providers except for that family practice. Has seen no gynecologist. Has had no mammogram or colonoscopy or other preventive care.  At that initial visit, she was not fasting so could not check lipid panel. At that visit we just checked CMET and A1c. CMET revealed elevated LFTs. Also showed A1c elevated at 8.9. At that time I said to stop the Crestor and also to find out if she was taking any Tylenol or drinking any alcohol.  At f/u OV 11/05/13  she said that she does not remember getting a message to stop the Crestor. Said that she was still taking Crestor. Said that she does not use any Tylenol and does not drink any type of alcohol. At the time of that lab result also said to increase metformin from 500 twice a day to 1000 mg twice a day. She said that she did make this change. Also said to add Actos 45 mg daily. She says that she did add this medication as well. As it turns out, we'll recheck LFTs on 11/05/13 and they were normal. This was on Crestor and we  verified that she was on Crestor at the time of these LFTs were normal.  Therefore we told her to just go ahead and stay on the Crestor and we will monitor LFTs. Today she reports that she is still on the Crestor.  She says that when she checks her blood sugar she always checks it fasting morning. Says that she checks it about once a week.  She did not bring in her blood sugar log with her today.  She had follow-up office visit with me on 11/05/2013 for complete physical exam.  Today she reports that she is taking the Crestor and has been on this this entire time. She is taking other medications as directed as well. Also verified that she is taking aspirin which was added at her last visit with me.  She has no complaints today.   Past Medical History  Diagnosis Date  . Memory impairment   . Allergy   . Asthma 2015  . Anxiety 2013  . Depression 2013  . Diabetes mellitus 2004  . GERD (gastroesophageal reflux disease) 2013  . Hypertension 1992  . Hyperlipidemia 1992     Home Meds: Outpatient Prescriptions Prior to Visit  Medication Sig Dispense Refill  . albuterol (PROVENTIL HFA;VENTOLIN HFA) 108 (90 BASE) MCG/ACT inhaler Inhale 2  puffs into the lungs every 4 (four) hours as needed. 1 Inhaler 5  . aspirin 81 MG tablet Take 1 tablet (81 mg total) by mouth daily. 30 tablet 11  . lisinopril-hydrochlorothiazide (PRINZIDE,ZESTORETIC) 10-12.5 MG per tablet Take 1 tablet by mouth daily. 30 tablet 5  . LORazepam (ATIVAN) 1 MG tablet TAKE ONE TABLET BY MOUTH TWICE DAILY 60 tablet 2  . Nutritional Supplements (ESTROVEN PO) Take 1 tablet by mouth at bedtime. OTC    . omeprazole (PRILOSEC OTC) 20 MG tablet Take 1 tablet (20 mg total) by mouth daily. 30 tablet 5  . pioglitazone (ACTOS) 45 MG tablet Take 1 tablet (45 mg total) by mouth daily. 90 tablet 1  . rosuvastatin (CRESTOR) 40 MG tablet Take 1 tablet (40 mg total) by mouth daily. 90 tablet 0  . zolpidem (AMBIEN) 10 MG tablet TAKE ONE TABLET  BY MOUTH AT BEDTIME AS NEEDED FOR SLEEP 30 tablet 2  . metFORMIN (GLUCOPHAGE) 1000 MG tablet Take 1 tablet (1,000 mg total) by mouth 2 (two) times daily with a meal. 180 tablet 3  . sertraline (ZOLOFT) 50 MG tablet TAKE ONE TABLET BY MOUTH ONCE DAILY 30 tablet 0   No facility-administered medications prior to visit.    Allergies:  Allergies  Allergen Reactions  . Contrast Media [Iodinated Diagnostic Agents] Anaphylaxis  . Codeine Nausea And Vomiting    History   Social History  . Marital Status: Married    Spouse Name: N/A    Number of Children: N/A  . Years of Education: N/A   Occupational History  . Not on file.   Social History Main Topics  . Smoking status: Never Smoker   . Smokeless tobacco: Never Used  . Alcohol Use: No  . Drug Use: No  . Sexual Activity: Yes   Other Topics Concern  . Not on file   Social History Narrative   Married.    Lives with husband and youngest son, who is 71 y/o.   Has 4 children.   Does not work outside of home.    No formal exercise. Only activity around the house.   Tries to be careful with Diabetic diet.    Family History  Problem Relation Age of Onset  . Cancer Father     Lymphoma, Leukemia  . Depression Father   . Cancer Maternal Grandfather   . Cancer Paternal Grandmother   . Diabetes Paternal Grandfather   . Kidney disease Paternal Grandfather      Review of Systems:  See HPI for pertinent ROS. All other ROS negative.    Physical Exam: Blood pressure 144/90, pulse 60, temperature 97.8 F (36.6 C), temperature source Oral, resp. rate 18, weight 240 lb (108.863 kg)., Body mass index is 45.37 kg/(m^2). General: Obese WF. Appears in no acute distress. Neck: Supple. No thyromegaly. No lymphadenopathy. No carotid bruit.  Lungs: Clear bilaterally to auscultation without wheezes, rales, or rhonchi. Breathing is unlabored. Heart: RRR with S1 S2. No murmurs, rubs, or gallops. Abdomen: Soft, non-tender, non-distended with  normoactive bowel sounds. No hepatomegaly. No rebound/guarding. No obvious abdominal masses. Musculoskeletal:  Strength and tone normal for age. Extremities/Skin: Warm and dry. Neuro: Alert and oriented X 3. Moves all extremities spontaneously. Gait is normal. CNII-XII grossly in tact. Psych:  Responds to questions appropriately with a normal affect. Diabetic Foot Exam: Inspection is normal.  Sensation is normal and intact.  1+ bilateral posterior tibial pulses and dorsalis pedis pulses.      ASSESSMENT AND PLAN:  53 y.o. year old female with     1. Type 2 diabetes mellitus with hyperglycemia  On ACE inhibitor On statin On ASA 81 mg daily  Micro albumin was done 11/05/2013  She reports that she does have annual diabetic eye exam-had this recently Diabetic Foot Exam entered into Quality Metrics 09/19/2013   2. Hyperlipidemia She is taking Crestor. She has had elevated LFTs on one of the checks in the past but then they were normal at the most recent LFT check.Recheck now. -CMET - Lipid panel  3. Elevated LFTs - COMPLETE METABOLIC PANEL WITH GFR   4. Essential hypertension Blood pressure at goal. On ACE inhibitor. BMET normal at lab 09/19/13 and 11/05/13.  5. Vitamin D deficiency On labs 11/05/13 vitamin D was low at 23. Currently she is on vitamin D. I told her to start taking vitamin D over-the-counter 2000 units daily.  5. Asthma, mild intermittent, uncomplicated  6. Gastroesophageal reflux disease, esophagitis presence not specified   7. Anxiety She was on Zoloft 200 mg.  At OV 09/19/2013 discussed: Need to start this back at low dose at 50 mg for couple weeks. Then can increase to 100 mg and then slowly will titrate back up as needed. At OV 09/19/13 Rxed: - sertraline (ZOLOFT) 50 MG tablet; Take 1 tablet (50 mg total) by mouth daily.  Dispense: 30 tablet; Refill: 0 AT OV 09/19/13 Rxed - LORazepam (ATIVAN) 1 MG tablet; Take 1 tablet (1 mg total) by mouth 2 (two) times  daily.  Dispense: 60 tablet; Refill: 2      ------NEED TO F/U THIS WITH HER AT NEXT OV. -----    8. Depression See # 4 above. - sertraline (ZOLOFT) 50 MG tablet; Take 1 tablet (50 mg total) by mouth daily.  Dispense: 30 tablet; Refill: 0  9. Insomnia Reports that she has to take Ambien every night in order to get adequate sleep. This works well for her and causes no adverse effects. - zolpidem (AMBIEN) 10 MG tablet; Take 1 tablet (10 mg total) by mouth at bedtime as needed for sleep.  Dispense: 30 tablet; Refill: 2  10. GERD (gastroesophageal reflux disease) Symptoms are controlled with omeprazole daily. - omeprazole (PRILOSEC OTC) 20 MG tablet; Take 1 tablet (20 mg total) by mouth daily.  Dispense: 30 tablet; Refill: 5  11. Asthma - albuterol (PROVENTIL HFA;VENTOLIN HFA) 108 (90 BASE) MCG/ACT inhaler; Inhale 2 puffs into the lungs every 4 (four) hours as needed.  Dispense: 1 Inhaler; Refill: 5  12. Memory Impairment Asked her about this as it was documented in her history. She says that she has had CT scan and evaluation of this.--I reviewed in Epic--2013--had MRI Brain--Findings c/w "Chronic Microvascular Ischemia"      She had complete physical exam with me on 11/05/2013. Visit for preventive health examination She had breast exam and pelvic exam by me on 11/21/13.  A. Screening Labs:  She is fasting today.  - CBC with Differential - COMPLETE METABOLIC PANEL WITH GFR - Lipid panel - TSH - Vit D  25 hydroxy (rtn osteoporosis monitoring)  B. Screening Colonoscopy: She reports that she has never had a screening colonoscopy. I discussed risks versus benefits of having screening colonoscopy. She is agreeable for me to go ahead and schedule referral to GI for her to schedule this. - Ambulatory referral to Gastroenterology At office visit 03/11/2014 patient says that she never heard anything about the GI appointment. At office visit 03/11/2014 I ordered another GI referral.  He says  that she has gotten a new phone and does have voice mail so I wonder if this has been a problem in the past.  C. Pelvic Exam, Pap Smear, Breast Exam, Mammogram She returned on 11/21/2013 and we did breast exam and pelvic exam. Patient says that she did follow-up for the mammogram that was ordered that day.  D. Immunizations: Tetanus:  Tdap given here 11/05/2013.  Pneumonia Vaccine:   Pneumovax 23--given here 11/05/2013   Have her schedule routine follow-up office visit here in 3 months or follow-up sooner if needed.  Signed, [address] [place], [place], [facility] 03/11/2014 10:18 AM

## 2014-03-13 ENCOUNTER — Encounter: Payer: Self-pay | Admitting: Internal Medicine

## 2014-03-15 ENCOUNTER — Other Ambulatory Visit: Payer: Self-pay | Admitting: Physician Assistant

## 2014-03-15 MED ORDER — SITAGLIPTIN PHOSPHATE 50 MG PO TABS: 50.0000 mg | ORAL_TABLET | Freq: Every day | ORAL | Status: DC

## 2014-04-29 ENCOUNTER — Other Ambulatory Visit: Payer: Self-pay | Admitting: Physician Assistant

## 2014-04-29 ENCOUNTER — Encounter: Payer: Self-pay | Admitting: Physician Assistant

## 2014-04-29 NOTE — Telephone Encounter (Signed)
?   OK to Refill - LOV 03/11/14 Last refills 02/24/14

## 2014-05-01 NOTE — Telephone Encounter (Signed)
Approved for #[id number] 

## 2014-05-01 NOTE — Telephone Encounter (Signed)
rx called in

## 2014-05-10 ENCOUNTER — Telehealth: Payer: Self-pay | Admitting: Family Medicine

## 2014-05-10 MED ORDER — ZOLPIDEM TARTRATE 10 MG PO TABS: 10.0000 mg | ORAL_TABLET | Freq: Every evening | ORAL | Status: DC | PRN

## 2014-05-10 MED ORDER — ROSUVASTATIN CALCIUM 40 MG PO TABS: 40.0000 mg | ORAL_TABLET | Freq: Every day | ORAL | Status: DC

## 2014-05-10 MED ORDER — PIOGLITAZONE HCL 45 MG PO TABS: 45.0000 mg | ORAL_TABLET | Freq: Every day | ORAL | Status: DC

## 2014-05-10 MED ORDER — SERTRALINE HCL 50 MG PO TABS: 50.0000 mg | ORAL_TABLET | Freq: Every day | ORAL | Status: DC

## 2014-05-10 NOTE — Telephone Encounter (Signed)
Medication refilled per protocol. 

## 2014-05-13 ENCOUNTER — Encounter: Payer: Self-pay | Admitting: *Deleted

## 2014-05-21 ENCOUNTER — Encounter: Payer: 59 | Admitting: Internal Medicine

## 2014-06-03 ENCOUNTER — Encounter: Payer: Self-pay | Admitting: Physician Assistant

## 2014-06-04 ENCOUNTER — Telehealth: Payer: Self-pay | Admitting: Family Medicine

## 2014-06-04 MED ORDER — ZOLPIDEM TARTRATE 10 MG PO TABS: 10.0000 mg | ORAL_TABLET | Freq: Every evening | ORAL | Status: DC | PRN

## 2014-06-04 NOTE — Telephone Encounter (Signed)
rx called in

## 2014-06-04 NOTE — Telephone Encounter (Signed)
Approved for #30+2 additional refills 

## 2014-06-04 NOTE — Telephone Encounter (Signed)
Pharm req RF Ambien.  LRF 05/10/14 #30 +0.  LOV 03/11/14.  Ok refill?

## 2014-06-12 ENCOUNTER — Encounter: Payer: Self-pay | Admitting: Physician Assistant

## 2014-06-12 ENCOUNTER — Ambulatory Visit (INDEPENDENT_AMBULATORY_CARE_PROVIDER_SITE_OTHER): Payer: 59 | Admitting: Physician Assistant

## 2014-06-12 VITALS — BP 126/76 | HR 60 | Temp 97.5°F | Resp 18 | Wt 241.0 lb

## 2014-06-12 DIAGNOSIS — E785 Hyperlipidemia, unspecified: Secondary | ICD-10-CM | POA: Diagnosis not present

## 2014-06-12 DIAGNOSIS — F32A Depression, unspecified: Secondary | ICD-10-CM

## 2014-06-12 DIAGNOSIS — I10 Essential (primary) hypertension: Secondary | ICD-10-CM

## 2014-06-12 DIAGNOSIS — F329 Major depressive disorder, single episode, unspecified: Secondary | ICD-10-CM | POA: Diagnosis not present

## 2014-06-12 DIAGNOSIS — F419 Anxiety disorder, unspecified: Secondary | ICD-10-CM

## 2014-06-12 DIAGNOSIS — K219 Gastro-esophageal reflux disease without esophagitis: Secondary | ICD-10-CM | POA: Diagnosis not present

## 2014-06-12 DIAGNOSIS — J4520 Mild intermittent asthma, uncomplicated: Secondary | ICD-10-CM | POA: Diagnosis not present

## 2014-06-12 DIAGNOSIS — E1165 Type 2 diabetes mellitus with hyperglycemia: Secondary | ICD-10-CM | POA: Diagnosis not present

## 2014-06-12 LAB — HEMOGLOBIN A1C, FINGERSTICK: HEMOGLOBIN A1C, FINGERSTICK: 7.6 % — AB (ref <5.7)

## 2014-06-12 NOTE — Progress Notes (Signed)
Patient ID: CADY HAFEN MRN: [id number], DOB: Mar 10, 1961, 54 y.o. Date of Encounter: @DATE @  Chief Complaint:  Chief Complaint  Patient presents with  . 3 mth check up    is fasting    HPI: 54 y.o. year old white female  Presents for follow-up office visit.   She saw me on 09/19/13 as a new patient to establish care. She said that she had been going to [facility] on [address] in [place]. Said that she had been going there for 30 years. York Spaniel that they actually dismissed her (but did not state the reason why).  York Spaniel that her last office visit and last labs there were about 3 months prior to her visit here on 09/19/2013. She said that she went there for office visits every 3 months.  However said that she was out of all of her medications except for her diabetes and blood pressure medicines.  Stated that she was seeing no other medical providers except for that family practice. Has seen no gynecologist. Has had no mammogram or colonoscopy or other preventive care.  At that initial visit, she was not fasting so could not check lipid panel. At that visit we just checked CMET and A1c. CMET revealed elevated LFTs. Also showed A1c elevated at 8.9. At that time I said to stop the Crestor and also to find out if she was taking any Tylenol or drinking any alcohol.  At f/u OV 11/05/13  she said that she does not remember getting a message to stop the Crestor. Said that she was still taking Crestor. Said that she does not use any Tylenol and does not drink any type of alcohol. At the time of that lab result also said to increase metformin from 500 twice a day to 1000 mg twice a day. She said that she did make this change. Also said to add Actos 45 mg daily. She says that she did add this medication as well. As it turns out, when we rechecked LFTs on 11/05/13-- they were normal. This was on Crestor and we verified that she was on Crestor at the time of these LFTs were normal.    Therefore we told her to just go ahead and stay on the Crestor and we will monitor LFTs. Today she reports that she is still on the Crestor.  She says that when she checks her blood sugar she always checks it fasting morning. Says that she checks it about once a week.  She did not bring in her blood sugar log with her today.  She had follow-up office visit with me on 11/05/2013 for complete physical exam.  Today she reports that she is taking the Crestor and has been on this this entire time. She is taking other medications as directed as well. Also verified that she is taking aspirin which was added at prior visit with me.  At 06/11/13 OV she reports that she only occasionally checks BS. Did not bring log.  I reveiwed last labs I did here. 03/11/14 A1C was 7.4---said to add Januvia 50mg --she says she did add this and IS taking this daily.   She has no complaints today.   Past Medical History  Diagnosis Date  . Memory impairment   . Allergy   . Asthma 2015  . Anxiety 2013  . Depression 2013  . Diabetes mellitus 2004  . GERD (gastroesophageal reflux disease) 2013  . Hypertension 1992  . Hyperlipidemia 1992  Home Meds: Outpatient Prescriptions Prior to Visit  Medication Sig Dispense Refill  . albuterol (PROVENTIL HFA;VENTOLIN HFA) 108 (90 BASE) MCG/ACT inhaler Inhale 2 puffs into the lungs every 4 (four) hours as needed. 1 Inhaler 5  . aspirin 81 MG tablet Take 1 tablet (81 mg total) by mouth daily. 30 tablet 11  . Cholecalciferol (VITAMIN D) 2000 UNITS CAPS Take 1 capsule (2,000 Units total) by mouth daily. 30 capsule 11  . lisinopril-hydrochlorothiazide (PRINZIDE,ZESTORETIC) 10-12.5 MG per tablet Take 1 tablet by mouth daily. 30 tablet 5  . LORazepam (ATIVAN) 1 MG tablet TAKE ONE TABLET BY MOUTH TWICE DAILY 60 tablet 2  . metFORMIN (GLUCOPHAGE) 1000 MG tablet Take 1 tablet (1,000 mg total) by mouth 2 (two) times daily with a meal. 180 tablet 3  . Nutritional Supplements  (ESTROVEN PO) Take 1 tablet by mouth at bedtime. OTC    . omeprazole (PRILOSEC OTC) 20 MG tablet Take 1 tablet (20 mg total) by mouth daily. 30 tablet 5  . pioglitazone (ACTOS) 45 MG tablet Take 1 tablet (45 mg total) by mouth daily. 90 tablet 0  . rosuvastatin (CRESTOR) 40 MG tablet Take 1 tablet (40 mg total) by mouth daily. 90 tablet 0  . sertraline (ZOLOFT) 50 MG tablet Take 1 tablet (50 mg total) by mouth daily. 90 tablet 0  . sitaGLIPtin (JANUVIA) 50 MG tablet Take 1 tablet (50 mg total) by mouth daily. 30 tablet 2  . zolpidem (AMBIEN) 10 MG tablet Take 1 tablet (10 mg total) by mouth at bedtime as needed. for sleep 30 tablet 2   No facility-administered medications prior to visit.    Allergies:  Allergies  Allergen Reactions  . Contrast Media [Iodinated Diagnostic Agents] Anaphylaxis  . Codeine Nausea And Vomiting    History   Social History  . Marital Status: Married    Spouse Name: N/A  . Number of Children: N/A  . Years of Education: N/A   Occupational History  . Not on file.   Social History Main Topics  . Smoking status: Never Smoker   . Smokeless tobacco: Never Used  . Alcohol Use: No  . Drug Use: No  . Sexual Activity: Yes   Other Topics Concern  . Not on file   Social History Narrative   Married.    Lives with husband and youngest son, who is 62 y/o.   Has 4 children.   Does not work outside of home.    No formal exercise. Only activity around the house.   Tries to be careful with Diabetic diet.    Family History  Problem Relation Age of Onset  . Cancer Father     Lymphoma, Leukemia  . Depression Father   . Cancer Maternal Grandfather   . Cancer Paternal Grandmother   . Diabetes Paternal Grandfather   . Kidney disease Paternal Grandfather      Review of Systems:  See HPI for pertinent ROS. All other ROS negative.    Physical Exam: Blood pressure 126/76, pulse 60, temperature 97.5 F (36.4 C), temperature source Oral, resp. rate 18, weight  241 lb (109.317 kg)., Body mass index is 45.56 kg/(m^2). General: Obese WF. Appears in no acute distress. Neck: Supple. No thyromegaly. No lymphadenopathy. No carotid bruit.  Lungs: Clear bilaterally to auscultation without wheezes, rales, or rhonchi. Breathing is unlabored. Heart: RRR with S1 S2. No murmurs, rubs, or gallops. Abdomen: Soft, non-tender, non-distended with normoactive bowel sounds. No hepatomegaly. No rebound/guarding. No obvious abdominal masses. Musculoskeletal:  Strength and tone normal for age. Extremities/Skin: Warm and dry. Neuro: Alert and oriented X 3. Moves all extremities spontaneously. Gait is normal. CNII-XII grossly in tact. Psych:  Responds to questions appropriately with a normal affect. Diabetic Foot Exam: Inspection is normal.  Sensation is normal and intact.  1+ bilateral posterior tibial pulses and dorsalis pedis pulses.      ASSESSMENT AND PLAN:  54 y.o. year old female with     1. Type 2 diabetes mellitus with hyperglycemia  On ACE inhibitor On statin On ASA 81 mg daily  Micro albumin was done 11/05/2013  She reports that she does have annual diabetic eye exam-had this recently Diabetic Foot Exam entered into Quality Metrics 09/19/2013   2. Hyperlipidemia She is taking Crestor. She has had elevated LFTs on one of the checks in the past but then they were normal at the most recent LFT check. LFTs normalagain at last check 03/2014.  03/2014 LDL at goal--at 73   3. Elevated LFTs -See  #2 above and see HPI   4. Essential hypertension Blood pressure at goal. On ACE inhibitor. BMET normal at lab 09/19/13 and 11/05/13 and 03/11/14.  5. Vitamin D deficiency On labs 11/05/13 vitamin D was low at 23. Currently she is on vitamin D. I told her to start taking vitamin D over-the-counter 2000 units daily.  5. Asthma, mild intermittent, uncomplicated  6. Gastroesophageal reflux disease, esophagitis presence not specified   7. Anxiety She was on  Zoloft 200 mg.  At OV 09/19/2013 discussed: Need to start this back at low dose at 50 mg for couple weeks. Then can increase to 100 mg and then slowly will titrate back up as needed. At OV 09/19/13 Rxed: - sertraline (ZOLOFT) 50 MG tablet; Take 1 tablet (50 mg total) by mouth daily.  Dispense: 30 tablet; Refill: 0 AT OV 09/19/13 Rxed - LORazepam (ATIVAN) 1 MG tablet; Take 1 tablet (1 mg total) by mouth 2 (two) times daily.  Dispense: 60 tablet; Refill: 2      ------NEED TO F/U THIS WITH HER AT NEXT OV. -----    8. Depression See # 4 above. - sertraline (ZOLOFT) 50 MG tablet; Take 1 tablet (50 mg total) by mouth daily.  Dispense: 30 tablet; Refill: 0  9. Insomnia Reports that she has to take Ambien every night in order to get adequate sleep. This works well for her and causes no adverse effects. - zolpidem (AMBIEN) 10 MG tablet; Take 1 tablet (10 mg total) by mouth at bedtime as needed for sleep.  Dispense: 30 tablet; Refill: 2  10. GERD (gastroesophageal reflux disease) Symptoms are controlled with omeprazole daily. - omeprazole (PRILOSEC OTC) 20 MG tablet; Take 1 tablet (20 mg total) by mouth daily.  Dispense: 30 tablet; Refill: 5  11. Asthma - albuterol (PROVENTIL HFA;VENTOLIN HFA) 108 (90 BASE) MCG/ACT inhaler; Inhale 2 puffs into the lungs every 4 (four) hours as needed.  Dispense: 1 Inhaler; Refill: 5  12. Memory Impairment Asked her about this as it was documented in her history. She says that she has had CT scan and evaluation of this.--I reviewed in Epic--2013--had MRI Brain--Findings c/w "Chronic Microvascular Ischemia"      She had complete physical exam with me on 11/05/2013. Visit for preventive health examination She had breast exam and pelvic exam by me on 11/21/13.  A. Screening Labs:  She is fasting today.  - CBC with Differential - COMPLETE METABOLIC PANEL WITH GFR - Lipid panel - TSH -  Vit D  25 hydroxy (rtn osteoporosis monitoring)  B. Screening  Colonoscopy: She reports that she has never had a screening colonoscopy. I discussed risks versus benefits of having screening colonoscopy. She is agreeable for me to go ahead and schedule referral to GI for her to schedule this. - Ambulatory referral to Gastroenterology At office visit 03/11/2014 patient says that she never heard anything about the GI appointment. At office visit 03/11/2014 I ordered another GI referral. He says that she has gotten a new phone and does have voice mail so I wonder if this has been a problem in the past. At OV 06/12/14--Says this was scheduled but had family emergency so cancelled. Has not yet re-scheduled but says she will.   C. Pelvic Exam, Pap Smear, Breast Exam, Mammogram She returned on 11/21/2013 and we did breast exam and pelvic exam. Patient says that she did follow-up for the mammogram that was ordered that day.  D. Immunizations: Tetanus:  Tdap given here 11/05/2013.  Pneumonia Vaccine:   Pneumovax 23--given here 11/05/2013   Have her schedule routine follow-up office visit here in 3 months or follow-up sooner if needed.  Signed, [address], [place], [facility] 06/12/2014 9:53 AM

## 2014-06-14 ENCOUNTER — Telehealth: Payer: Self-pay | Admitting: Family Medicine

## 2014-06-14 DIAGNOSIS — IMO0002 Reserved for concepts with insufficient information to code with codable children: Secondary | ICD-10-CM

## 2014-06-14 DIAGNOSIS — E1165 Type 2 diabetes mellitus with hyperglycemia: Secondary | ICD-10-CM

## 2014-06-14 MED ORDER — SITAGLIPTIN PHOSPHATE 100 MG PO TABS: 100.0000 mg | ORAL_TABLET | Freq: Every day | ORAL | Status: DC

## 2014-06-14 NOTE — Telephone Encounter (Signed)
Unable to reach patient.  Phone number not valid.  RX sent to pharmacy.  Left message on husbands phone for pt to call office

## 2014-06-14 NOTE — Telephone Encounter (Signed)
-----   Message from Dorena BodoMary B Dixon, PA-C sent at 06/12/2014  1:30 PM EST ----- Tell patient A1c 7.6. Tell her to increase Januvia from 50 mg to 100 mg. Remove the 50 mg dose from the medicine list and order the 100 mg dose.

## 2014-06-20 ENCOUNTER — Encounter: Payer: Self-pay | Admitting: *Deleted

## 2014-07-02 ENCOUNTER — Encounter: Payer: Self-pay | Admitting: Physician Assistant

## 2014-08-05 ENCOUNTER — Other Ambulatory Visit: Payer: Self-pay | Admitting: Physician Assistant

## 2014-08-05 ENCOUNTER — Encounter: Payer: Self-pay | Admitting: Physician Assistant

## 2014-08-05 NOTE — Telephone Encounter (Signed)
Ok to refill??  Last office visit 06/12/2014.  Last refill 05/01/2014, #3 refills.

## 2014-08-05 NOTE — Telephone Encounter (Signed)
She had been on Zoloft 200mg  in past.  I had restarted Zoloft at 50mg  with plans to titrate up dose.  However, 50mg  is still on med list.  Tell her to schedule OV to discuss this. Will Rx one month supply of med to use in interim but needs OV within ONE month. May send # 60 + 0.

## 2014-08-07 ENCOUNTER — Encounter: Payer: Self-pay | Admitting: Family Medicine

## 2014-08-07 NOTE — Telephone Encounter (Signed)
One month refill called in.  Pt has a 3 mth appt for 09/23/14.

## 2014-08-30 ENCOUNTER — Other Ambulatory Visit: Payer: Self-pay | Admitting: Physician Assistant

## 2014-08-30 NOTE — Telephone Encounter (Signed)
Medication refilled per protocol. 

## 2014-09-05 ENCOUNTER — Encounter: Payer: Self-pay | Admitting: Physician Assistant

## 2014-09-09 ENCOUNTER — Other Ambulatory Visit: Payer: Self-pay | Admitting: Physician Assistant

## 2014-09-09 NOTE — Telephone Encounter (Signed)
?   Ok to refill  Last rf 08/07/14  Last  Ov 06/2014

## 2014-09-10 NOTE — Telephone Encounter (Signed)
Script called in to pharmacy  

## 2014-09-10 NOTE — Telephone Encounter (Signed)
Approved # 60 + 2 

## 2014-09-23 ENCOUNTER — Encounter: Payer: Self-pay | Admitting: Physician Assistant

## 2014-09-23 ENCOUNTER — Ambulatory Visit (INDEPENDENT_AMBULATORY_CARE_PROVIDER_SITE_OTHER): Payer: 59 | Admitting: Physician Assistant

## 2014-09-23 VITALS — BP 120/80 | HR 80 | Temp 98.1°F | Resp 20 | Wt 236.0 lb

## 2014-09-23 DIAGNOSIS — E669 Obesity, unspecified: Secondary | ICD-10-CM

## 2014-09-23 DIAGNOSIS — K219 Gastro-esophageal reflux disease without esophagitis: Secondary | ICD-10-CM

## 2014-09-23 DIAGNOSIS — F32A Depression, unspecified: Secondary | ICD-10-CM

## 2014-09-23 DIAGNOSIS — I10 Essential (primary) hypertension: Secondary | ICD-10-CM

## 2014-09-23 DIAGNOSIS — E1165 Type 2 diabetes mellitus with hyperglycemia: Secondary | ICD-10-CM

## 2014-09-23 DIAGNOSIS — J4520 Mild intermittent asthma, uncomplicated: Secondary | ICD-10-CM | POA: Diagnosis not present

## 2014-09-23 DIAGNOSIS — E559 Vitamin D deficiency, unspecified: Secondary | ICD-10-CM | POA: Diagnosis not present

## 2014-09-23 DIAGNOSIS — F329 Major depressive disorder, single episode, unspecified: Secondary | ICD-10-CM

## 2014-09-23 DIAGNOSIS — Z1212 Encounter for screening for malignant neoplasm of rectum: Secondary | ICD-10-CM | POA: Diagnosis not present

## 2014-09-23 DIAGNOSIS — F419 Anxiety disorder, unspecified: Secondary | ICD-10-CM | POA: Diagnosis not present

## 2014-09-23 DIAGNOSIS — Z1211 Encounter for screening for malignant neoplasm of colon: Secondary | ICD-10-CM

## 2014-09-23 DIAGNOSIS — E785 Hyperlipidemia, unspecified: Secondary | ICD-10-CM

## 2014-09-23 LAB — LIPID PANEL
Cholesterol: 287 mg/dL — ABNORMAL HIGH (ref 0–200)
HDL: 45 mg/dL — ABNORMAL LOW (ref >=46)
LDL CALC: 192 mg/dL — AB (ref 0–99)
Total CHOL/HDL Ratio: 6.4 Ratio
Triglycerides: 248 mg/dL — ABNORMAL HIGH (ref <150)
VLDL: 50 mg/dL — ABNORMAL HIGH (ref 0–40)

## 2014-09-23 LAB — COMPLETE METABOLIC PANEL WITH GFR
ALBUMIN: 4 g/dL (ref 3.5–5.2)
ALT: 43 U/L — AB (ref 0–35)
AST: 29 U/L (ref 0–37)
Alkaline Phosphatase: 65 U/L (ref 39–117)
BUN: 8 mg/dL (ref 6–23)
CALCIUM: 9.3 mg/dL (ref 8.4–10.5)
CHLORIDE: 103 meq/L (ref 96–112)
CO2: 25 mEq/L (ref 19–32)
Creat: 0.61 mg/dL (ref 0.50–1.10)
GFR, Est Non African American: >89 mL/min
GLUCOSE: 233 mg/dL — AB (ref 70–99)
POTASSIUM: 4 meq/L (ref 3.5–5.3)
SODIUM: 140 meq/L (ref 135–145)
TOTAL PROTEIN: 6.7 g/dL (ref 6.0–8.3)
Total Bilirubin: 0.4 mg/dL (ref 0.2–1.2)

## 2014-09-23 LAB — HEMOGLOBIN A1C
Hgb A1c MFr Bld: 8 % — ABNORMAL HIGH (ref <5.7)
Mean Plasma Glucose: 183 mg/dL — ABNORMAL HIGH (ref <117)

## 2014-09-23 NOTE — Progress Notes (Signed)
[name] MRN: [id number], DOB: 1960/06/30, 54 y.o. Date of Encounter: _0 @  Chief Complaint:  Chief Complaint  Patient presents with  . Follow-up    3 mos    HPI: 54 y.o. year old white female  Presents for follow-up office visit.   She saw me on 09/19/13 as a new patient to establish care. She said that she had been going to [facility] on [facility] in [place]. Said that she had been going there for 30 years. Michela Pitcher that they actually dismissed her (but did not state the reason why).  Michela Pitcher that her last office visit and last labs there were about 3 months prior to her visit here on 09/19/2013. She said that she went there for office visits every 3 months.  However said that she was out of all of her medications except for her diabetes and blood pressure medicines.  Stated that she was seeing no other medical providers except for that family practice. Has seen no gynecologist. Has had no mammogram or colonoscopy or other preventive care.  At that initial visit, she was not fasting so could not check lipid panel. At that visit we just checked CMET and A1c. CMET revealed elevated LFTs. Also showed A1c elevated at 8.9. At that time I said to stop the Crestor and also to find out if she was taking any Tylenol or drinking any alcohol.  At f/u OV 11/05/13  she said that she does not remember getting a message to stop the Crestor. Said that she was still taking Crestor. Said that she does not use any Tylenol and does not drink any type of alcohol. At the time of that lab result also said to increase metformin from 500 twice a day to 1000 mg twice a day. She said that she did make this change. Also said to add Actos 45 mg daily. She says that she did add this medication as well. As it turns out, when we rechecked LFTs on 11/05/13-- they were normal. This was on Crestor and we verified that she was on Crestor at the time of these LFTs were normal.  Therefore  we told her to just go ahead and stay on the Crestor and we will monitor LFTs. Today she reports that she is still on the Crestor.  She says that when she checks her blood sugar she always checks it fasting morning. Says that she checks it about once a week.  She did not bring in her blood sugar log with her today.  She had follow-up office visit with me on 11/05/2013 for complete physical exam.  Today she reports that she is taking the Crestor and has been on this this entire time. She is taking other medications as directed as well. Also verified that she is taking aspirin which was added at prior visit with me.  At 06/11/13 OV she reports that she only occasionally checks BS. Did not bring log.  I reveiwed last labs I did here. 03/11/14 A1C was 7.4---said to add Januvia 67m--she says she did add this and IS taking this daily.   At office visit 09/23/14 I reviewed the fact that at her initial visit with me she had told me that in the past, she had been on Zoloft 200 mg but at the time of that initial visit with me she was off Zoloft completely but was needing to resume it. At that time we started Zoloft at 50 mg with plans  to gradually titrate up the dose. However, thus far the 50 mg dose has been continued. Discussed this with her today and she states that the 50 mg dose seems to be working well for her and she does not feel that she needs to increase the dose. Is controlling her symptoms well as causing no adverse effects.  At visit 09/23/14 she does not bring in any type of blood sugar log or blood sugar reading reports. States that she is taking medications as directed. She has been educated regarding proper diet and exercise but is noncompliant with this.   She has no complaints today.   Past Medical History  Diagnosis Date  . Memory impairment   . Allergy   . Asthma 2015  . Anxiety 2013  . Depression 2013  . Diabetes mellitus 2004  . GERD (gastroesophageal reflux disease) 2013  .  Hypertension 1992  . Hyperlipidemia 1992     Home Meds: Outpatient Prescriptions Prior to Visit  Medication Sig Dispense Refill  . aspirin 81 MG tablet Take 1 tablet (81 mg total) by mouth daily. 30 tablet 11  . lisinopril-hydrochlorothiazide (PRINZIDE,ZESTORETIC) 10-12.5 MG per tablet Take 1 tablet by mouth daily. 30 tablet 5  . LORazepam (ATIVAN) 1 MG tablet TAKE 1 TABLET TWICE A DAY 60 tablet 2  . metFORMIN (GLUCOPHAGE) 1000 MG tablet Take 1 tablet (1,000 mg total) by mouth 2 (two) times daily with a meal. 180 tablet 3  . Nutritional Supplements (ESTROVEN PO) Take 1 tablet by mouth at bedtime. OTC    . omeprazole (PRILOSEC OTC) 20 MG tablet Take 1 tablet (20 mg total) by mouth daily. 30 tablet 5  . omeprazole (PRILOSEC) 20 MG capsule TAKE ONE CAPSULE EVERY DAY 30 capsule 1  . pioglitazone (ACTOS) 45 MG tablet Take 1 tablet (45 mg total) by mouth daily. 90 tablet 0  . rosuvastatin (CRESTOR) 40 MG tablet Take 1 tablet (40 mg total) by mouth daily. (Patient not taking: Reported on 09/23/2014) 90 tablet 0  . sertraline (ZOLOFT) 50 MG tablet TAKE 1 TABLET (50 MG TOTAL) BY MOUTH DAILY. 90 tablet 0  . sitaGLIPtin (JANUVIA) 100 MG tablet Take 1 tablet (100 mg total) by mouth daily. 30 tablet 2  . zolpidem (AMBIEN) 10 MG tablet Take 1 tablet (10 mg total) by mouth at bedtime as needed. for sleep 30 tablet 2  . albuterol (PROVENTIL HFA;VENTOLIN HFA) 108 (90 BASE) MCG/ACT inhaler Inhale 2 puffs into the lungs every 4 (four) hours as needed. (Patient not taking: Reported on 09/23/2014) 1 Inhaler 5  . Cholecalciferol (VITAMIN D) 2000 UNITS CAPS Take 1 capsule (2,000 Units total) by mouth daily. (Patient not taking: Reported on 09/23/2014) 30 capsule 11   No facility-administered medications prior to visit.    Allergies:  Allergies  Allergen Reactions  . Contrast Media [Iodinated Diagnostic Agents] Anaphylaxis  . Codeine Nausea And Vomiting    History   Social History  . Marital Status: Married      Spouse Name: N/A  . Number of Children: N/A  . Years of Education: N/A   Occupational History  . Not on file.   Social History Main Topics  . Smoking status: Never Smoker   . Smokeless tobacco: Never Used  . Alcohol Use: No  . Drug Use: No  . Sexual Activity: Yes   Other Topics Concern  . Not on file   Social History Narrative   Married.    Lives with husband and youngest son, who is  59 y/o.   Has 4 children.   Does not work outside of home.    No formal exercise. Only activity around the house.   Tries to be careful with Diabetic diet.    Family History  Problem Relation Age of Onset  . Cancer Father     Lymphoma, Leukemia  . Depression Father   . Cancer Maternal Grandfather   . Cancer Paternal Grandmother   . Diabetes Paternal Grandfather   . Kidney disease Paternal Grandfather      Review of Systems:  See HPI for pertinent ROS. All other ROS negative.    Physical Exam: Blood pressure 120/80, pulse 80, temperature 98.1 F (36.7 C), temperature source Oral, resp. rate 20, weight 236 lb (107.049 kg)., Body mass index is 44.61 kg/(m^2). General: Obese WF. Appears in no acute distress. Neck: Supple. No thyromegaly. No lymphadenopathy. No carotid bruit.  Lungs: Clear bilaterally to auscultation without wheezes, rales, or rhonchi. Breathing is unlabored. Heart: RRR with S1 S2. No murmurs, rubs, or gallops. Abdomen: Soft, non-tender, non-distended with normoactive bowel sounds. No hepatomegaly. No rebound/guarding. No obvious abdominal masses. Musculoskeletal:  Strength and tone normal for age. Extremities/Skin: Warm and dry. Neuro: Alert and oriented X 3. Moves all extremities spontaneously. Gait is normal. CNII-XII grossly in tact. Psych:  Responds to questions appropriately with a normal affect. Diabetic Foot Exam: Inspection is normal.  Sensation is normal and intact.  1+ bilateral posterior tibial pulses and dorsalis pedis pulses.      ASSESSMENT AND PLAN:   54 y.o. year old female with     1. Type 2 diabetes mellitus with hyperglycemia  On ACE inhibitor On statin On ASA 81 mg daily  Micro albumin was done 11/05/2013  She reports that she does have annual diabetic eye exam-had this April or May 2015. At OV 09/23/14 reminded her that this is due and told her to go home and call to schedule this and wrote this on her AVS Diabetic Foot Exam entered into [employer] 09/19/2013. Again on 09/23/14.   2. Hyperlipidemia She is taking Crestor. She has had elevated LFTs on one of the checks in the past but then they were normal at the most recent LFT check. LFTs normalagain at last check 03/2014.  03/2014 LDL at goal--at 73   3. Elevated LFTs -See  #2 above and see HPI   4. Essential hypertension Blood pressure at goal. On ACE inhibitor. BMET normal at lab 09/19/13 and 11/05/13 and 03/11/14.  5. Vitamin D deficiency On labs 11/05/13 vitamin D was low at 23. Currently she is on vitamin D. I told her to start taking vitamin D over-the-counter 2000 units daily. Recheck Vitamin D at lab 09/23/2014--  5. Asthma, mild intermittent, uncomplicated  6. Gastroesophageal reflux disease, esophagitis presence not specified   7. Anxiety She was on Zoloft 200 mg in past, prior to coming to our office.  At OV 09/19/2013 discussed: Need to start this back at low dose at 50 mg for couple weeks. Then can increase to 100 mg and then slowly will titrate back up as needed. At OV 09/19/13 Rxed: - sertraline (ZOLOFT) 50 MG tablet; Take 1 tablet (50 mg total) by mouth daily.  Dispense: 30 tablet; Refill: 0 AT OV 09/19/13 Rxed - LORazepam (ATIVAN) 1 MG tablet; Take 1 tablet (1 mg total) by mouth 2 (two) times daily.  Dispense: 60 tablet; Refill: 2  At OV 09/23/2014---- reviewed with patient that she has continued on the above medications  since then. She states that current medications are controlling her symptoms well. She is not feeling anxious or depressed with using  Zoloft at 50 mg. Wants to continue current dose and current treatment.    8. Depression See # 4 above. - sertraline (ZOLOFT) 50 MG tablet; Take 1 tablet (50 mg total) by mouth daily.  Dispense: 30 tablet; Refill: 0  9. Insomnia Reports that she has to take Ambien every night in order to get adequate sleep. This works well for her and causes no adverse effects. - zolpidem (AMBIEN) 10 MG tablet; Take 1 tablet (10 mg total) by mouth at bedtime as needed for sleep.  Dispense: 30 tablet; Refill: 2  10. GERD (gastroesophageal reflux disease) Symptoms are controlled with omeprazole daily. - omeprazole (PRILOSEC OTC) 20 MG tablet; Take 1 tablet (20 mg total) by mouth daily.  Dispense: 30 tablet; Refill: 5  11. Asthma - albuterol (PROVENTIL HFA;VENTOLIN HFA) 108 (90 BASE) MCG/ACT inhaler; Inhale 2 puffs into the lungs every 4 (four) hours as needed.  Dispense: 1 Inhaler; Refill: 5  12. Memory Impairment Asked her about this as it was documented in her history. She says that she has had CT scan and evaluation of this.--I reviewed in Epic--2013--had MRI Brain--Findings c/w "Chronic Microvascular Ischemia"      She had complete physical exam with me on 11/05/2013. Visit for preventive health examination She had breast exam and pelvic exam by me on 11/21/13. THE FOLLOWING IS COPIED FROM THOSE VISITS:  A. Screening Labs:  She is fasting today.  - CBC with Differential - COMPLETE METABOLIC PANEL WITH GFR - Lipid panel - TSH - Vit D  25 hydroxy (rtn osteoporosis monitoring)  B. Screening Colonoscopy: She reports that she has never had a screening colonoscopy. I discussed risks versus benefits of having screening colonoscopy. She is agreeable for me to go ahead and schedule referral to GI for her to schedule this. - Ambulatory referral to Gastroenterology At office visit 03/11/2014 patient says that she never heard anything about the GI appointment. At office visit 03/11/2014 I ordered another GI  referral. He says that she has gotten a new phone and does have voice mail so I wonder if this has been a problem in the past. At OV 06/12/14--Says this was scheduled but had family emergency so cancelled. Has not yet re-scheduled but says she will. At OV 09/23/2014---Discussed this. Pt states that she does not want to follow-up with colonoscopy. Aware of risks versus benefits but defers. Therefore at today's visit discussed option of doing Hemoccults 3 and she is agreeable. HemeOccult x 3 ordered at OV 09/23/2014    C. Pelvic Exam, Pap Smear, Breast Exam, Mammogram She returned on 11/21/2013 and we did breast exam and pelvic exam. Patient says that she did follow-up for the mammogram that was ordered that day.  D. Immunizations: Tetanus:  Tdap given here 11/05/2013.  Pneumonia Vaccine:   Pneumovax 23--given here 11/05/2013 ---------------------------No further Pneumonia Vaccine indicated until Age 65 Zostavax; Discuss at age 24    Have her schedule routine follow-up office visit here in 3 months or follow-up sooner if needed.  Signed, [address] [place], [place], [facility] 09/23/2014 10:57 AM

## 2014-09-24 ENCOUNTER — Telehealth: Payer: Self-pay | Admitting: Family Medicine

## 2014-09-24 LAB — VITAMIN D 25 HYDROXY (VIT D DEFICIENCY, FRACTURES): Vit D, 25-Hydroxy: 16 ng/mL — ABNORMAL LOW (ref 30–100)

## 2014-09-24 MED ORDER — BLOOD GLUCOSE MONITOR KIT
1.0000 | PACK | Status: DC
Start: 2014-09-24 — End: 2019-11-27

## 2014-09-24 MED ORDER — VITAMIN D (ERGOCALCIFEROL) 1.25 MG (50000 UNIT) PO CAPS: 50000.0000 [IU] | ORAL_CAPSULE | ORAL | Status: DC

## 2014-09-24 MED ORDER — LANCETS MISC: Status: DC

## 2014-09-24 MED ORDER — GLUCOSE BLOOD VI STRP: ORAL_STRIP | Status: DC

## 2014-09-24 MED ORDER — GLIPIZIDE ER 10 MG PO TB24: 10.0000 mg | ORAL_TABLET | Freq: Every day | ORAL | Status: DC

## 2014-09-24 MED ORDER — ROSUVASTATIN CALCIUM 40 MG PO TABS: 40.0000 mg | ORAL_TABLET | Freq: Every day | ORAL | Status: DC

## 2014-09-24 NOTE — Telephone Encounter (Signed)
Pt stated that she was off Crestor due to cost.  Told must resume.  Gave her discount card to help with expense.  Understands about Vitamin D.  Understands about new Diabetic medicine.  Pt states she does not check her blood sugars because no one ever told her to.  I told her to start doing fasting every morning.  Rx to pharmacy for meter and supplies.  Given log sheet with discount card and told to bring with her to 3 month appt.

## 2014-09-24 NOTE — Telephone Encounter (Signed)
-----   Message from Dorena Bodo, PA-C sent at 09/24/2014 11:15 AM EDT ----- Med List includes Crestor 40mg . At office visit she did not report stopping this medication or having any adverse effects to this medication. Prior LDLs had been 64 and 73.  Current LDL 192.   This indicates that she has possibly accidentally stopped the Crestor. Find out whether this was an accident or if she had stopped it for some other reason. Tell her to restart the Crestor 40 mg (unless she's having some kind of adverse effects or something-- in that case, let me know).  Also her vitamin D is very low----tell her to take ergocalciferol 50,000 units 1 every week for 12 weeks #12+0 refills. When she finishes this prescription strength THEN she needs to TAKE daily over-the-counter vitamin D 4000 units daily.  Also tell her that her sugar is not controlled with current meds. A1C 8.0. CONTINUE all current meds as they are but ADD new medication---Glucotrol XL 10mg  1 po QAM. # 30 +3.   Make sure to have f/u OV 3 months.  BRING BS LOG TO THAT APPT.

## 2014-10-04 ENCOUNTER — Other Ambulatory Visit: Payer: Self-pay | Admitting: Physician Assistant

## 2014-10-04 NOTE — Telephone Encounter (Signed)
Medication refilled per protocol. 

## 2014-10-10 ENCOUNTER — Encounter: Payer: Self-pay | Admitting: Physician Assistant

## 2014-10-11 ENCOUNTER — Other Ambulatory Visit: Payer: 59

## 2014-10-11 DIAGNOSIS — Z1212 Encounter for screening for malignant neoplasm of rectum: Principal | ICD-10-CM

## 2014-10-11 DIAGNOSIS — Z1211 Encounter for screening for malignant neoplasm of colon: Secondary | ICD-10-CM

## 2014-10-12 LAB — FECAL OCCULT BLOOD, IMMUNOCHEMICAL
FECAL OCCULT BLOOD: NEGATIVE
FECAL OCCULT BLOOD: NEGATIVE
Fecal Occult Blood: NEGATIVE

## 2014-10-14 ENCOUNTER — Encounter: Payer: Self-pay | Admitting: Family Medicine

## 2014-10-18 ENCOUNTER — Other Ambulatory Visit: Payer: Self-pay | Admitting: Physician Assistant

## 2014-10-18 NOTE — Telephone Encounter (Signed)
Ok to refill??  Last office visit 09/23/2014.  Last refill 06/04/2014, #2 refills.

## 2014-10-18 NOTE — Telephone Encounter (Signed)
ok 

## 2014-10-18 NOTE — Telephone Encounter (Signed)
Medication refilled per protocol. 

## 2014-10-29 ENCOUNTER — Other Ambulatory Visit: Payer: Self-pay | Admitting: Physician Assistant

## 2014-10-29 NOTE — Telephone Encounter (Signed)
Medication refilled per protocol. 

## 2014-11-09 ENCOUNTER — Other Ambulatory Visit: Payer: Self-pay | Admitting: Physician Assistant

## 2014-11-11 NOTE — Telephone Encounter (Signed)
Medication refilled per protocol. 

## 2014-12-11 ENCOUNTER — Other Ambulatory Visit: Payer: Self-pay | Admitting: Physician Assistant

## 2014-12-12 NOTE — Telephone Encounter (Signed)
Medication called to pharmacy. 

## 2014-12-12 NOTE — Telephone Encounter (Signed)
Ok to refill??  Last office visit 09/23/2014.  Last refill 09/10/2014, #2 refills.

## 2014-12-12 NOTE — Telephone Encounter (Signed)
Approved # 60 + 2 

## 2014-12-20 ENCOUNTER — Other Ambulatory Visit: Payer: Self-pay | Admitting: Physician Assistant

## 2014-12-20 ENCOUNTER — Encounter: Payer: Self-pay | Admitting: Physician Assistant

## 2014-12-20 DIAGNOSIS — E559 Vitamin D deficiency, unspecified: Secondary | ICD-10-CM

## 2014-12-20 MED ORDER — CHOLECALCIFEROL 100 MCG (4000 UT) PO CAPS: 1.0000 | ORAL_CAPSULE | Freq: Every day | ORAL | Status: DC

## 2014-12-20 NOTE — Telephone Encounter (Signed)
Vit D 50,000 dose complete,  4000 iu dose to pharmacy

## 2014-12-25 ENCOUNTER — Encounter: Payer: Self-pay | Admitting: Physician Assistant

## 2014-12-25 ENCOUNTER — Ambulatory Visit (INDEPENDENT_AMBULATORY_CARE_PROVIDER_SITE_OTHER): Payer: 59 | Admitting: Physician Assistant

## 2014-12-25 VITALS — BP 120/80 | HR 68 | Temp 97.8°F | Resp 18 | Wt 236.0 lb

## 2014-12-25 DIAGNOSIS — E785 Hyperlipidemia, unspecified: Secondary | ICD-10-CM | POA: Diagnosis not present

## 2014-12-25 DIAGNOSIS — E1165 Type 2 diabetes mellitus with hyperglycemia: Secondary | ICD-10-CM | POA: Diagnosis not present

## 2014-12-25 DIAGNOSIS — I10 Essential (primary) hypertension: Secondary | ICD-10-CM

## 2014-12-25 DIAGNOSIS — F32A Depression, unspecified: Secondary | ICD-10-CM

## 2014-12-25 DIAGNOSIS — E669 Obesity, unspecified: Secondary | ICD-10-CM

## 2014-12-25 DIAGNOSIS — F419 Anxiety disorder, unspecified: Secondary | ICD-10-CM

## 2014-12-25 DIAGNOSIS — E559 Vitamin D deficiency, unspecified: Secondary | ICD-10-CM | POA: Diagnosis not present

## 2014-12-25 DIAGNOSIS — F329 Major depressive disorder, single episode, unspecified: Secondary | ICD-10-CM

## 2014-12-25 DIAGNOSIS — Z23 Encounter for immunization: Secondary | ICD-10-CM

## 2014-12-25 DIAGNOSIS — J4520 Mild intermittent asthma, uncomplicated: Secondary | ICD-10-CM | POA: Diagnosis not present

## 2014-12-25 DIAGNOSIS — K219 Gastro-esophageal reflux disease without esophagitis: Secondary | ICD-10-CM | POA: Diagnosis not present

## 2014-12-25 LAB — HEMOGLOBIN A1C, FINGERSTICK: Hgb A1C (fingerstick): 6.8 % — ABNORMAL HIGH (ref <5.7)

## 2014-12-25 NOTE — Progress Notes (Signed)
[name] MRN: [id number], DOB: 1960/06/30, 54 y.o. Date of Encounter: _0 @  Chief Complaint:  Chief Complaint  Patient presents with  . Follow-up    3 mos    HPI: 54 y.o. year old white female  Presents for follow-up office visit.   She saw me on 09/19/13 as a new patient to establish care. She said that she had been going to [facility] on [facility] in [place]. Said that she had been going there for 30 years. Michela Pitcher that they actually dismissed her (but did not state the reason why).  Michela Pitcher that her last office visit and last labs there were about 3 months prior to her visit here on 09/19/2013. She said that she went there for office visits every 3 months.  However said that she was out of all of her medications except for her diabetes and blood pressure medicines.  Stated that she was seeing no other medical providers except for that family practice. Has seen no gynecologist. Has had no mammogram or colonoscopy or other preventive care.  At that initial visit, she was not fasting so could not check lipid panel. At that visit we just checked CMET and A1c. CMET revealed elevated LFTs. Also showed A1c elevated at 8.9. At that time I said to stop the Crestor and also to find out if she was taking any Tylenol or drinking any alcohol.  At f/u [facility] 11/05/13  she said that she does not remember getting a message to stop the Crestor. Said that she was still taking Crestor. Said that she does not use any Tylenol and does not drink any type of alcohol. At the time of that lab result also said to increase metformin from 500 twice a day to 1000 mg twice a day. She said that she did make this change. Also said to add Actos 45 mg daily. She says that she did add this medication as well. As it turns out, when we rechecked LFTs on 11/05/13-- they were normal. This was on Crestor and we verified that she was on Crestor at the time of these LFTs were normal.  Therefore  we told her to just go ahead and stay on the Crestor and we will monitor LFTs. Today she reports that she is still on the Crestor.  She says that when she checks her blood sugar she always checks it fasting morning. Says that she checks it about once a week.  She did not bring in her blood sugar log with her today.  She had follow-up office visit with me on 11/05/2013 for complete physical exam.  Today she reports that she is taking the Crestor and has been on this this entire time. She is taking other medications as directed as well. Also verified that she is taking aspirin which was added at prior visit with me.  At 06/11/13 OV she reports that she only occasionally checks BS. Did not bring log.  I reveiwed last labs I did here. 03/11/14 A1C was 7.4---said to add Januvia 67m--she says she did add this and IS taking this daily.   At office visit 09/23/14 I reviewed the fact that at her initial visit with me she had told me that in the past, she had been on Zoloft 200 mg but at the time of that initial visit with me she was off Zoloft completely but was needing to resume it. At that time we started Zoloft at 50 mg with plans  to gradually titrate up the dose. However, thus far the 50 mg dose has been continued. Discussed this with her today and she states that the 50 mg dose seems to be working well for her and she does not feel that she needs to increase the dose. Is controlling her symptoms well as causing no adverse effects.  At visit 09/23/14 she does not bring in any type of blood sugar log or blood sugar reading reports. States that she is taking medications as directed. She has been educated regarding proper diet and exercise but is noncompliant with this.  09/23/2014 lab results showed  --LDL had increased significantly. Past labs that showed LDL 64 and 73 the current LDL at that time was 192. Today she states that "sometimes forgets to take Crestor ". Today I discussed the above lab results and  the fact that cholesterol is severely elevated without medication but well controlled with medication and the necessity for her to take Crestor on a daily basis. He voices understanding and agrees. ---Also at lab vitamin D was very low and she was told to take prescription strength weekly for 12 weeks then take 4000 units daily. States that she is taking this as directed and I reminded her about using the 4000 units daily when she completes the prescription. --Also at lab A1c was 8.0. For her to continue all current medications and also add Glucotrol XL 10 mg daily. Today she states that she is taking this as directed.  Today --12/25/2014--- she did bring in paperwork she has been writing sugar readings.  States that she only checks fasting morning readings. She has the following readings: She has 36 readings----none of these are in the 90s. 3 of these are in the 100s. 5 of these are in the 110s. 5 are these are in the 120s.  She has a few readings less than 90--- one reading that's an 17, one that is 69, 1 that is 82, 1 that is 88. She has 4 readings that are higher at 136, 167, 172, 165   Past Medical History  Diagnosis Date  . Memory impairment   . Allergy   . Asthma 2015  . Anxiety 2013  . Depression 2013  . Diabetes mellitus 2004  . GERD (gastroesophageal reflux disease) 2013  . Hypertension 1992  . Hyperlipidemia 1992     Home Meds: Outpatient Prescriptions Prior to Visit  Medication Sig Dispense Refill  . aspirin 81 MG tablet Take 1 tablet (81 mg total) by mouth daily. 30 tablet 11  . blood glucose meter kit and supplies KIT Inject 1 each into the skin as directed. Dispense based on patient and insurance preference. Check blood sugar fasting each morning.  ICD E11.65 1 each 0  . Cholecalciferol 4000 UNITS CAPS Take 1 capsule (4,000 Units total) by mouth daily. 90 capsule 3  . glipiZIDE (GLUCOTROL XL) 10 MG 24 hr tablet Take 1 tablet (10 mg total) by mouth daily with breakfast. 30  tablet 5  . glucose blood test strip Dispense based on patient and insurance preference.  Check blood sugar fasting each morning.  ICD E11.65 50 each 12  . JANUVIA 100 MG tablet TAKE 1 TABLET (100 MG TOTAL) BY MOUTH DAILY. 30 tablet 5  . Lancets MISC Dispense based on patient and insurance preference.  Check blood sugar fasting each morning.  ICD E11.65 100 each 3  . lisinopril-hydrochlorothiazide (PRINZIDE,ZESTORETIC) 10-12.5 MG per tablet TAKE 1 TABLET EVERY DAY 30 tablet 3  .  LORazepam (ATIVAN) 1 MG tablet TAKE 1 TABLET TWICE A DAY 60 tablet 2  . metFORMIN (GLUCOPHAGE) 1000 MG tablet Take 1 tablet (1,000 mg total) by mouth 2 (two) times daily with a meal. 180 tablet 3  . Nutritional Supplements (ESTROVEN PO) Take 1 tablet by mouth at bedtime. OTC    . omeprazole (PRILOSEC) 20 MG capsule TAKE ONE CAPSULE EVERY DAY 30 capsule 1  . pioglitazone (ACTOS) 45 MG tablet Take 1 tablet (45 mg total) by mouth daily. 90 tablet 0  . rosuvastatin (CRESTOR) 40 MG tablet Take 1 tablet (40 mg total) by mouth daily. 30 tablet 5  . sertraline (ZOLOFT) 50 MG tablet TAKE 1 TABLET (50 MG TOTAL) BY MOUTH DAILY. 90 tablet 1  . Vitamin D, Ergocalciferol, (DRISDOL) 50000 UNITS CAPS capsule Take 1 capsule (50,000 Units total) by mouth every 7 (seven) days. 12 capsule 0  . zolpidem (AMBIEN) 10 MG tablet TAKE 1 TABLET BY MOUTH AT BEDTIME AS NEEDED 30 tablet 2   No facility-administered medications prior to visit.    Allergies:  Allergies  Allergen Reactions  . Contrast Media [Iodinated Diagnostic Agents] Anaphylaxis  . Codeine Nausea And Vomiting    Social History   Social History  . Marital Status: Married    Spouse Name: N/A  . Number of Children: N/A  . Years of Education: N/A   Occupational History  . Not on file.   Social History Main Topics  . Smoking status: Never Smoker   . Smokeless tobacco: Never Used  . Alcohol Use: No  . Drug Use: No  . Sexual Activity: Yes   Other Topics Concern  . Not  on file   Social History Narrative   Married.    Lives with husband and youngest son, who is 67 y/o.   Has 4 children.   Does not work outside of home.    No formal exercise. Only activity around the house.   Tries to be careful with Diabetic diet.    Family History  Problem Relation Age of Onset  . Cancer Father     Lymphoma, Leukemia  . Depression Father   . Cancer Maternal Grandfather   . Cancer Paternal Grandmother   . Diabetes Paternal Grandfather   . Kidney disease Paternal Grandfather      Review of Systems:  See HPI for pertinent ROS. All other ROS negative.    Physical Exam: Blood pressure 120/80, pulse 68, temperature 97.8 F (36.6 C), temperature source Oral, resp. rate 18, weight 236 lb (107.049 kg)., Body mass index is 44.61 kg/(m^2). General: Obese WF. Appears in no acute distress. Neck: Supple. No thyromegaly. No lymphadenopathy. No carotid bruit.  Lungs: Clear bilaterally to auscultation without wheezes, rales, or rhonchi. Breathing is unlabored. Heart: RRR with S1 S2. No murmurs, rubs, or gallops. Abdomen: Soft, non-tender, non-distended with normoactive bowel sounds. No hepatomegaly. No rebound/guarding. No obvious abdominal masses. Musculoskeletal:  Strength and tone normal for age. Extremities/Skin: Warm and dry. Neuro: Alert and oriented X 3. Moves all extremities spontaneously. Gait is normal. CNII-XII grossly in tact. Psych:  Responds to questions appropriately with a normal affect. Diabetic Foot Exam: Inspection is normal.  Sensation is normal and intact.  1+ bilateral posterior tibial pulses and dorsalis pedis pulses.      ASSESSMENT AND PLAN:  54 y.o. year old female with   1. Type 2 diabetes mellitus with hyperglycemia  On ACE inhibitor On statin On ASA 81 mg daily  Micro albumin was done  11/05/2013, 12/25/2014  She reports that she does have annual diabetic eye exam-had this April or May 2015.  --At [place] 09/23/14 reminded her that this is due  and told her to go home and call to schedule this and wrote this on her AVS --- At [place] 12/25/14 she reports that she still has not had eye exam. Again today, I wrote this on her AVS as a reminder. Diabetic Foot Exam entered into [employer] 09/19/2013. Again on 09/23/14.   2. Hyperlipidemia She is taking Crestor. She has had elevated LFTs on one of the checks in the past but then they were normal at the most recent LFT check. LFTs norma lagain at check 03/2014 and 09/2014 03/2014 LDL at goal--at 73 09/2014 LDL was up significantly at 192. Discussed at follow-up visit 12/25/14 and patient states that she doesn't remember to take the Crestor daily. Discussed the variation in these lab results and the importance of taking the Crestor daily. He is agreeable to do so.   3. Elevated LFTs -See  #2 above and see HPI. LFTs normal 04/2013 and 09/2014   4. Essential hypertension Blood pressure at goal. On ACE inhibitor. BMET normal at lab 09/19/13 and 11/05/13 and 03/11/14 and 09/2014.  5. Vitamin D deficiency On labs 11/05/13 vitamin D was low at 23. Currently she is on vitamin D. I told her to start taking vitamin D over-the-counter 2000 units daily. Recheck Vitamin D at lab 09/23/2014--I am indeed very low. She was told to take prescription 50,000 units weekly for 12 weeks and when she completes this she is to take over-the-counter vitamin D 4000 units daily. At [place] 12/25/14 she states that she is taking vitamin D as directed and reminded her to take the over-the-counter supplements when she completes the prescription strength. She voices understanding and agrees.  5. Asthma, mild intermittent, uncomplicated This is stable and controlled. 6. Gastroesophageal reflux disease, esophagitis presence not specified Axis is stable and controlled.  7. Anxiety She was on Zoloft 200 mg in past, prior to coming to our office.  At [place] 09/19/2013 discussed: Need to start this back at low dose at 50 mg for couple weeks.  Then can increase to 100 mg and then slowly will titrate back up as needed. At [place] 09/19/13 Rxed: - sertraline (ZOLOFT) 50 MG tablet; Take 1 tablet (50 mg total) by mouth daily.  Dispense: 30 tablet; Refill: 0 AT [facility] 09/19/13 Rxed - LORazepam (ATIVAN) 1 MG tablet; Take 1 tablet (1 mg total) by mouth 2 (two) times daily.  Dispense: 60 tablet; Refill: 2  At [place] 09/23/2014---- reviewed with patient that she has continued on the above medications since then. She states that current medications are controlling her symptoms well. She is not feeling anxious or depressed with using Zoloft at 50 mg. Wants to continue current dose and current treatment.  This remains stable and controlled on current medications.   8. Depression See # 7 above. This remains stable and controlled on current medications.  9. Insomnia Reports that she has to take Ambien every night in order to get adequate sleep. This works well for her and causes no adverse effects.   10. GERD (gastroesophageal reflux disease) Symptoms are controlled with omeprazole daily.  11. Asthma - albuterol (PROVENTIL HFA;VENTOLIN HFA) 108 (90 BASE) MCG/ACT inhaler; Inhale 2 puffs into the lungs every 4 (four) hours as needed.  Dispense: 1 Inhaler; Refill: 5  12. Memory Impairment Asked her about this as it was documented in  her history. She says that she has had CT scan and evaluation of this.--I reviewed in Epic--2013--had MRI Brain--Findings c/w "Chronic Microvascular Ischemia"      She had complete physical exam with me on 11/05/2013. Visit for preventive health examination She had breast exam and pelvic exam by me on 11/21/13. THE FOLLOWING IS COPIED FROM THOSE VISITS:  A. Screening Labs:  She is fasting today.  - CBC with Differential - COMPLETE METABOLIC PANEL WITH GFR - Lipid panel - TSH - Vit D  25 hydroxy (rtn osteoporosis monitoring)  B. Screening Colonoscopy: She reports that she has never had a screening colonoscopy. I discussed  risks versus benefits of having screening colonoscopy. She is agreeable for me to go ahead and schedule referral to GI for her to schedule this. - Ambulatory referral to Gastroenterology At office visit 03/11/2014 patient says that she never heard anything about the GI appointment. At office visit 03/11/2014 I ordered another GI referral. He says that she has gotten a new phone and does have voice mail so I wonder if this has been a problem in the past. At [place] 06/12/14--Says this was scheduled but had family emergency so cancelled. Has not yet re-scheduled but says she will. At [place] 09/23/2014---Discussed this. Pt states that she does not want to follow-up with colonoscopy. Aware of risks versus benefits but defers. Therefore at today's visit discussed option of doing Hemoccults 3 and she is agreeable. HemeOccult x 3 ordered at [facility] 09/23/2014    C. Pelvic Exam, Pap Smear, Breast Exam, Mammogram She returned on 11/21/2013 and we did breast exam and pelvic exam. Patient says that she did follow-up for the mammogram that was ordered that day. Last mammogram was 11/29/2013------ negative  D. Immunizations: Influenza Vaccine:   12/25/2014 Tetanus:  Tdap given here 11/05/2013.  Pneumonia Vaccine:   Pneumovax 23--given here 11/05/2013 ---------------------------No further Pneumonia Vaccine indicated until Age 48 Zostavax; Discuss at age 89   At office visit 12/25/14" 2 things on her AVS to help remind her to follow-up scheduling: --- Eye exam --- Mammogram--- last mammogram was 11/29/13 negative Have her schedule routine follow-up office visit here in 3 months or follow-up sooner if needed.  Signed, [address] [place], [place], [facility] 12/25/2014 8:22 AM

## 2014-12-26 LAB — MICROALBUMIN, URINE: Microalb, Ur: 1.4 mg/dL (ref <2.0)

## 2014-12-30 ENCOUNTER — Other Ambulatory Visit: Payer: Self-pay | Admitting: Family Medicine

## 2015-01-23 ENCOUNTER — Encounter: Payer: Self-pay | Admitting: Physician Assistant

## 2015-01-23 ENCOUNTER — Other Ambulatory Visit: Payer: Self-pay | Admitting: Physician Assistant

## 2015-01-23 NOTE — Telephone Encounter (Signed)
Medication refilled per protocol. 

## 2015-01-23 NOTE — Telephone Encounter (Signed)
Approved. #30+2 refills

## 2015-01-23 NOTE — Telephone Encounter (Signed)
Ok to refill??  Last office visit 12/25/2014.  Last refill 10/18/2014, #2 refills.

## 2015-03-08 ENCOUNTER — Other Ambulatory Visit: Payer: Self-pay | Admitting: Physician Assistant

## 2015-03-10 NOTE — Telephone Encounter (Signed)
Medication refilled per protocol. 

## 2015-03-13 ENCOUNTER — Other Ambulatory Visit: Payer: Self-pay | Admitting: Physician Assistant

## 2015-03-13 NOTE — Telephone Encounter (Signed)
Ok to refill??  Last office visit 12/25/2014.  Last refill 12/12/2014, #2 refills.

## 2015-03-13 NOTE — Telephone Encounter (Signed)
Medication called to pharmacy. 

## 2015-03-13 NOTE — Telephone Encounter (Signed)
Approved # 60 + 2 

## 2015-03-26 ENCOUNTER — Ambulatory Visit: Payer: 59 | Admitting: Physician Assistant

## 2015-04-02 ENCOUNTER — Encounter: Payer: Self-pay | Admitting: Physician Assistant

## 2015-04-02 ENCOUNTER — Ambulatory Visit (INDEPENDENT_AMBULATORY_CARE_PROVIDER_SITE_OTHER): Payer: 59 | Admitting: Physician Assistant

## 2015-04-02 VITALS — BP 136/88 | HR 60 | Temp 97.6°F | Resp 18 | Wt 242.0 lb

## 2015-04-02 DIAGNOSIS — F419 Anxiety disorder, unspecified: Secondary | ICD-10-CM

## 2015-04-02 DIAGNOSIS — E1165 Type 2 diabetes mellitus with hyperglycemia: Secondary | ICD-10-CM

## 2015-04-02 DIAGNOSIS — K219 Gastro-esophageal reflux disease without esophagitis: Secondary | ICD-10-CM

## 2015-04-02 DIAGNOSIS — I10 Essential (primary) hypertension: Secondary | ICD-10-CM

## 2015-04-02 DIAGNOSIS — J4520 Mild intermittent asthma, uncomplicated: Secondary | ICD-10-CM | POA: Diagnosis not present

## 2015-04-02 DIAGNOSIS — F32A Depression, unspecified: Secondary | ICD-10-CM

## 2015-04-02 DIAGNOSIS — F329 Major depressive disorder, single episode, unspecified: Secondary | ICD-10-CM

## 2015-04-02 DIAGNOSIS — G4700 Insomnia, unspecified: Secondary | ICD-10-CM | POA: Diagnosis not present

## 2015-04-02 DIAGNOSIS — E669 Obesity, unspecified: Secondary | ICD-10-CM | POA: Diagnosis not present

## 2015-04-02 DIAGNOSIS — E785 Hyperlipidemia, unspecified: Secondary | ICD-10-CM | POA: Diagnosis not present

## 2015-04-02 DIAGNOSIS — E559 Vitamin D deficiency, unspecified: Secondary | ICD-10-CM

## 2015-04-02 LAB — COMPLETE METABOLIC PANEL WITH GFR
ALBUMIN: 3.8 g/dL (ref 3.6–5.1)
ALK PHOS: 59 U/L (ref 33–130)
ALT: 32 U/L — ABNORMAL HIGH (ref 6–29)
AST: 31 U/L (ref 10–35)
BILIRUBIN TOTAL: 0.4 mg/dL (ref 0.2–1.2)
BUN: 9 mg/dL (ref 7–25)
CO2: 24 mmol/L (ref 20–31)
CREATININE: 0.64 mg/dL (ref 0.50–1.05)
Calcium: 8.9 mg/dL (ref 8.6–10.4)
Chloride: 104 mmol/L (ref 98–110)
GFR, Est African American: >89 mL/min (ref >=60)
GFR, Est Non African American: >89 mL/min (ref >=60)
GLUCOSE: 140 mg/dL — AB (ref 70–99)
Potassium: 4 mmol/L (ref 3.5–5.3)
SODIUM: 143 mmol/L (ref 135–146)
TOTAL PROTEIN: 6.7 g/dL (ref 6.1–8.1)

## 2015-04-02 MED ORDER — MIRTAZAPINE 30 MG PO TABS: 30.0000 mg | ORAL_TABLET | Freq: Every day | ORAL | Status: DC

## 2015-04-02 NOTE — Progress Notes (Signed)
[name] MRN: [id number], DOB: 1960/06/30, 54 y.o. Date of Encounter: _0 @  Chief Complaint:  Chief Complaint  Patient presents with  . Follow-up    3 mos    HPI: 54 y.o. year old white female  Presents for follow-up office visit.   She saw me on 09/19/13 as a new patient to establish care. She said that she had been going to [facility] in [place]. Said that she had been going there for 30 years. Michela Pitcher that they actually dismissed her (but did not state the reason why).  Michela Pitcher that her last office visit and last labs there were about 3 months prior to her visit here on 09/19/2013. She said that she went there for office visits every 3 months.  However said that she was out of all of her medications except for her diabetes and blood pressure medicines.  Stated that she was seeing no other medical providers except for that family practice. Has seen no gynecologist. Has had no mammogram or colonoscopy or other preventive care.  At that initial visit, she was not fasting so could not check lipid panel. At that visit we just checked CMET and A1c. CMET revealed elevated LFTs. Also showed A1c elevated at 8.9. At that time I said to stop the Crestor and also to find out if she was taking any Tylenol or drinking any alcohol.  At f/u [facility] 11/05/13  she said that she does not remember getting a message to stop the Crestor. Said that she was still taking Crestor. Said that she does not use any Tylenol and does not drink any type of alcohol. At the time of that lab result also said to increase metformin from 500 twice a day to 1000 mg twice a day. She said that she did make this change. Also said to add Actos 45 mg daily. She says that she did add this medication as well. As it turns out, when we rechecked LFTs on 11/05/13-- they were normal. This was on Crestor and we verified that she was on Crestor at the time of these LFTs were normal.  Therefore  we told her to just go ahead and stay on the Crestor and we will monitor LFTs. Today she reports that she is still on the Crestor.  She says that when she checks her blood sugar she always checks it fasting morning. Says that she checks it about once a week.  She did not bring in her blood sugar log with her today.  She had follow-up office visit with me on 11/05/2013 for complete physical exam.  Today she reports that she is taking the Crestor and has been on this this entire time. She is taking other medications as directed as well. Also verified that she is taking aspirin which was added at prior visit with me.  At 06/11/13 OV she reports that she only occasionally checks BS. Did not bring log.  I reveiwed last labs I did here. 03/11/14 A1C was 7.4---said to add Januvia 67m--she says she did add this and IS taking this daily.   At office visit 09/23/14 I reviewed the fact that at her initial visit with me she had told me that in the past, she had been on Zoloft 200 mg but at the time of that initial visit with me she was off Zoloft completely but was needing to resume it. At that time we started Zoloft at 50 mg with plans  to gradually titrate up the dose. However, thus far the 50 mg dose has been continued. Discussed this with her today and she states that the 50 mg dose seems to be working well for her and she does not feel that she needs to increase the dose. Is controlling her symptoms well as causing no adverse effects.  At visit 09/23/14 she does not bring in any type of blood sugar log or blood sugar reading reports. States that she is taking medications as directed. She has been educated regarding proper diet and exercise but is noncompliant with this.  09/23/2014 lab results showed  --LDL had increased significantly. Past labs that showed LDL 64 and 73 the current LDL at that time was 192. At f/u OV 12/25/14-- she states that "sometimes forgets to take Crestor ". At [place] 12/25/14-- I discussed  the above lab results and the fact that cholesterol is severely elevated without medication but well controlled with medication and the necessity for her to take Crestor on a daily basis. She voices understanding and agrees. ---Also at lab vitamin D was very low and she was told to take prescription strength weekly for 12 weeks then take 4000 units daily. At [place] 12/25/14--States that she is taking this as directed and I reminded her about using the 4000 units daily when she completes the prescription. --Also at lab A1c was 8.0. For her to continue all current medications and also add Glucotrol XL 10 mg daily. 12/25/14-- she states that she is taking this as directed.  At [place] --12/25/2014--- she did bring in paperwork she has been writing sugar readings.  States that she only checks fasting morning readings. She has the following readings: She has 36 readings----none of these are in the 90s. 3 of these are in the 100s. 5 of these are in the 110s. 5 are these are in the 120s.  She has a few readings less than 90--- one reading that's an 34, one that is 69, 1 that is 82, 1 that is 88. She has 4 readings that are higher at 136, 167, 172, 165  At [facility] 04/02/15: She states that she has stopped Ambien. Says that her family told her that she was doing "crazy things "at night after taking that medicine. States that she does not remember doing and has no recollection of those events. Patient states that she is taking her Crestor now. However she is not fasting today. Says that she drank a "root beer" this morning and had forgotten about needing to be fasting. Also asked about what dose of vitamin D she is currently taking. Reviewed that at lab 09/23/14 she was to do the 50,000 units weekly for 12 weeks then 4000 units daily. She does not seem certain about exactly what she is taking but says that it is prescription. She did not bring in any blood sugars today for me to review. She states that she is taking all medicines  as directed and having no adverse effects other than what is stated about Ambien above.   Past Medical History  Diagnosis Date  . Memory impairment   . Allergy   . Asthma 2015  . Anxiety 2013  . Depression 2013  . Diabetes mellitus 2004  . GERD (gastroesophageal reflux disease) 2013  . Hypertension 1992  . Hyperlipidemia 1992     Home Meds: Outpatient Prescriptions Prior to Visit  Medication Sig Dispense Refill  . aspirin 81 MG tablet Take 1 tablet (81 mg total) by mouth  daily. 30 tablet 11  . blood glucose meter kit and supplies KIT Inject 1 each into the skin as directed. Dispense based on patient and insurance preference. Check blood sugar fasting each morning.  ICD E11.65 1 each 0  . Blood Glucose Monitoring Suppl (FREESTYLE LITE) DEVI UAD.E11.65  0  . glipiZIDE (GLUCOTROL XL) 10 MG 24 hr tablet Take 1 tablet (10 mg total) by mouth daily with breakfast. 30 tablet 5  . glucose blood test strip Dispense based on patient and insurance preference.  Check blood sugar fasting each morning.  ICD E11.65 50 each 12  . JANUVIA 100 MG tablet TAKE 1 TABLET (100 MG TOTAL) BY MOUTH DAILY. 30 tablet 5  . Lancets MISC Dispense based on patient and insurance preference.  Check blood sugar fasting each morning.  ICD E11.65 100 each 3  . lisinopril-hydrochlorothiazide (PRINZIDE,ZESTORETIC) 10-12.5 MG tablet TAKE 1 TABLET EVERY DAY 90 tablet 1  . LORazepam (ATIVAN) 1 MG tablet TAKE 1 TABLET BY MOUTH TWICE A DAY 60 tablet 2  . metFORMIN (GLUCOPHAGE) 1000 MG tablet Take 1 tablet (1,000 mg total) by mouth 2 (two) times daily with a meal. 180 tablet 3  . Nutritional Supplements (ESTROVEN PO) Take 1 tablet by mouth at bedtime. OTC    . omeprazole (PRILOSEC) 20 MG capsule TAKE ONE CAPSULE EVERY DAY 30 capsule 1  . pioglitazone (ACTOS) 45 MG tablet Take 1 tablet (45 mg total) by mouth daily. 90 tablet 0  . rosuvastatin (CRESTOR) 40 MG tablet Take 1 tablet (40 mg total) by mouth daily. 30 tablet 5  .  sertraline (ZOLOFT) 50 MG tablet TAKE 1 TABLET (50 MG TOTAL) BY MOUTH DAILY. 90 tablet 1  . Cholecalciferol 4000 UNITS CAPS Take 1 capsule (4,000 Units total) by mouth daily. 90 capsule 3  . Vitamin D, Ergocalciferol, (DRISDOL) 50000 UNITS CAPS capsule Take 1 capsule (50,000 Units total) by mouth every 7 (seven) days. (Patient not taking: Reported on 04/02/2015) 12 capsule 0  . zolpidem (AMBIEN) 10 MG tablet TAKE 1 TABLET BY MOUTH AT BEDTIME AS NEEDED (Patient not taking: Reported on 04/02/2015) 30 tablet 2   No facility-administered medications prior to visit.    Allergies:  Allergies  Allergen Reactions  . Contrast Media [Iodinated Diagnostic Agents] Anaphylaxis  . Codeine Nausea And Vomiting    Social History   Social History  . Marital Status: Married    Spouse Name: N/A  . Number of Children: N/A  . Years of Education: N/A   Occupational History  . Not on file.   Social History Main Topics  . Smoking status: Never Smoker   . Smokeless tobacco: Never Used  . Alcohol Use: No  . Drug Use: No  . Sexual Activity: Yes   Other Topics Concern  . Not on file   Social History Narrative   Married.    Lives with husband and youngest son, who is 84 y/o.   Has 4 children.   Does not work outside of home.    No formal exercise. Only activity around the house.   Tries to be careful with Diabetic diet.    Family History  Problem Relation Age of Onset  . Cancer Father     Lymphoma, Leukemia  . Depression Father   . Cancer Maternal Grandfather   . Cancer Paternal Grandmother   . Diabetes Paternal Grandfather   . Kidney disease Paternal Grandfather      Review of Systems:  See HPI for pertinent ROS. All  other ROS negative.    Physical Exam: Blood pressure 136/88, pulse 60, temperature 97.6 F (36.4 C), temperature source Oral, resp. rate 18, weight 242 lb (109.77 kg)., Body mass index is 45.75 kg/(m^2). General: Obese WF. Appears in no acute distress. Neck: Supple. No  thyromegaly. No lymphadenopathy. No carotid bruit.  Lungs: Clear bilaterally to auscultation without wheezes, rales, or rhonchi. Breathing is unlabored. Heart: RRR with S1 S2. No murmurs, rubs, or gallops. Abdomen: Soft, non-tender, non-distended with normoactive bowel sounds. No hepatomegaly. No rebound/guarding. No obvious abdominal masses. Musculoskeletal:  Strength and tone normal for age. Extremities/Skin: Warm and dry. Neuro: Alert and oriented X 3. Moves all extremities spontaneously. Gait is normal. CNII-XII grossly in tact. Psych:  Responds to questions appropriately with a normal affect. Diabetic Foot Exam: Inspection is normal.  Sensation is normal and intact.  1+ bilateral posterior tibial pulses and dorsalis pedis pulses.      ASSESSMENT AND PLAN:  54 y.o. year old female with   1. Type 2 diabetes mellitus with hyperglycemia  On ACE inhibitor On statin On ASA 81 mg daily  Micro albumin was done 11/05/2013, 12/25/2014  She reports that she does have annual diabetic eye exam-had this April or May 2015.  --At [place] 09/23/14 reminded her that this is due and told her to go home and call to schedule this and wrote this on her AVS --- At [place] 12/25/14 she reports that she still has not had eye exam. Again today, I wrote this on her AVS as a reminder. ---At [place] 04/02/15--- states she has eye exam scheduled for February. Diabetic Foot Exam entered into [employer] 09/19/2013. Again on 09/23/14.   2. Hyperlipidemia She is taking Crestor. She has had elevated LFTs on one of the checks in the past but then they were normal at the most recent LFT check. LFTs norma lagain at check 03/2014 and 09/2014 03/2014 LDL at goal--at 73 09/2014 LDL was up significantly at 192. Discussed at follow-up visit 12/25/14 and patient states that she doesn't remember to take the Crestor daily. Discussed the variation in these lab results and the importance of taking the Crestor daily. She is agreeable to do  so. At visit 04/02/15 she states that she is taking the Crestor daily. However she is not fasting today so we'll wait to repeat FLP.   3. Elevated LFTs -See  #2 above and see HPI. LFTs normal 04/2013 and 09/2014   4. Essential hypertension Blood pressure at goal. On ACE inhibitor. BMET normal at lab 09/19/13 and 11/05/13 and 03/11/14 and 09/2014.  5. Vitamin D deficiency On labs 11/05/13 vitamin D was low at 23. Currently she is on vitamin D. I told her to start taking vitamin D over-the-counter 2000 units daily. Recheck Vitamin D at lab 09/23/2014--I am indeed very low. She was told to take prescription 50,000 units weekly for 12 weeks and when she completes this she is to take over-the-counter vitamin D 4000 units daily. At [place] 12/25/14 she states that she is taking vitamin D as directed and reminded her to take the over-the-counter supplements when she completes the prescription strength. She voices understanding and agrees. At [place] 04/02/15 she does not seem certain about what dose of vitamin D she is taking currently. We'll recheck vitamin D level now.  5. Asthma, mild intermittent, uncomplicated This is stable and controlled.  6. Gastroesophageal reflux disease, esophagitis presence not specified This is stable and controlled.  7. Anxiety She was on Zoloft 200 mg  in past, prior to coming to our office.  At [place] 09/19/2013 discussed: Need to start this back at low dose at 50 mg for couple weeks. Then can increase to 100 mg and then slowly will titrate back up as needed. At [place] 09/19/13 Rxed: - sertraline (ZOLOFT) 50 MG tablet; Take 1 tablet (50 mg total) by mouth daily.  Dispense: 30 tablet; Refill: 0 AT [facility] 09/19/13 Rxed - LORazepam (ATIVAN) 1 MG tablet; Take 1 tablet (1 mg total) by mouth 2 (two) times daily.  Dispense: 60 tablet; Refill: 2  At [place] 09/23/2014---- reviewed with patient that she has continued on the above medications since then. She states that current medications are controlling her  symptoms well. She is not feeling anxious or depressed with using Zoloft at 50 mg. Wants to continue current dose and current treatment.  This remains stable and controlled on current medications.   8. Depression See # 7 above. This remains stable and controlled on current medications.  9. Insomnia At visit 04/02/15 she reports that Ambien has been causing her to do crazy things during the night that she does not recall doing. Therefore she has stopped the Ambien. At visit 04/02/15 prescribed Remeron 30 mg QHS. Told her to follow-up if this causes adverse effects or is not effective.  10. GERD (gastroesophageal reflux disease) Symptoms are controlled with omeprazole daily.  11. Asthma - albuterol (PROVENTIL HFA;VENTOLIN HFA) 108 (90 BASE) MCG/ACT inhaler; Inhale 2 puffs into the lungs every 4 (four) hours as needed.  Dispense: 1 Inhaler; Refill: 5  12. Memory Impairment Asked her about this as it was documented in her history. She says that she has had CT scan and evaluation of this.--I reviewed in Epic--2013--had MRI Brain--Findings c/w "Chronic Microvascular Ischemia"      She had complete physical exam with me on 11/05/2013. Visit for preventive health examination She had breast exam and pelvic exam by me on 11/21/13. THE FOLLOWING IS COPIED FROM THOSE VISITS:  A. Screening Labs:  She is fasting today.  - CBC with Differential - COMPLETE METABOLIC PANEL WITH GFR - Lipid panel - TSH - Vit D  25 hydroxy (rtn osteoporosis monitoring)  B. Screening Colonoscopy: She reports that she has never had a screening colonoscopy. I discussed risks versus benefits of having screening colonoscopy. She is agreeable for me to go ahead and schedule referral to GI for her to schedule this. - Ambulatory referral to Gastroenterology At office visit 03/11/2014 patient says that she never heard anything about the GI appointment. At office visit 03/11/2014 I ordered another GI referral. He says that  she has gotten a new phone and does have voice mail so I wonder if this has been a problem in the past. At [place] [facility] [place] 06/12/14--Says this was scheduled but had family emergency so cancelled. Has not yet re-scheduled but says she will. At [place] 09/23/2014---Discussed this. Pt states that she does not want to follow-up with colonoscopy. Aware of risks versus benefits but defers. Therefore at today's visit discussed option of doing Hemoccults 3 and she is agreeable. HemeOccult x 3 ordered at [facility] 09/23/2014    C. Pelvic Exam, Pap Smear, Breast Exam, Mammogram She returned on 11/21/2013 and we did breast exam and pelvic exam. Patient says that she did follow-up for the mammogram that was ordered that day. Last mammogram was 11/29/2013------ negative  D. Immunizations: Influenza Vaccine:   12/25/2014 Tetanus:  Tdap given here 11/05/2013.  Pneumonia Vaccine:   Pneumovax 23--given here 11/05/2013 ---------------------------No further  Pneumonia Vaccine indicated until Age 19 Zostavax; Discuss at age 89   At office visit 12/25/14" 2 things on her AVS to help remind her to follow-up scheduling: --- Eye exam --- Mammogram--- last mammogram was 11/29/13 negative  At visit 04/02/15 she reports that she has eye exam scheduled for February. Visit 04/02/15 she says that her insurance does not cover mammograms. Husband is working on trying to find a way for her to have this done. Told her to contact [facility]--- that they have some type of program to help pay for these.  Have her schedule routine follow-up office visit here in 3 months or follow-up sooner if needed.  [address] [place], [place], [place] [facility] 04/02/2015 10:34 AM

## 2015-04-03 LAB — VITAMIN D 25 HYDROXY (VIT D DEFICIENCY, FRACTURES): VIT D 25 HYDROXY: 26 ng/mL — AB (ref 30–100)

## 2015-04-03 LAB — HEMOGLOBIN A1C
HEMOGLOBIN A1C: 7.4 % — AB (ref <5.7)
Mean Plasma Glucose: 166 mg/dL — ABNORMAL HIGH (ref <117)

## 2015-04-09 ENCOUNTER — Encounter: Payer: Self-pay | Admitting: Family Medicine

## 2015-04-17 ENCOUNTER — Other Ambulatory Visit: Payer: Self-pay | Admitting: Physician Assistant

## 2015-04-17 NOTE — Telephone Encounter (Signed)
Medication refilled per protocol. 

## 2015-04-19 ENCOUNTER — Other Ambulatory Visit: Payer: Self-pay | Admitting: Physician Assistant

## 2015-04-21 NOTE — Telephone Encounter (Signed)
Medication refilled per protocol. 

## 2015-04-30 ENCOUNTER — Telehealth: Payer: Self-pay | Admitting: *Deleted

## 2015-04-30 NOTE — Telephone Encounter (Signed)
Is she receiving #90 per prescription? Is she only taking 1 each night? If both of the above are "yes" -- how is she already needing a refill if last fill was 04/02/15 ?

## 2015-04-30 NOTE — Telephone Encounter (Signed)
Received fax from [facility] pharmacy stating pt is requesting refill on MIRTAZAPINE    Qty: 90  Directions for use: Take 1 tab po at HS   Last rf:04/02/15  Last ov: 04/02/15  ?ok to refill

## 2015-04-30 NOTE — Telephone Encounter (Signed)
Tried calling pt number listed stated has been disconnected, will try again at later time

## 2015-05-01 ENCOUNTER — Other Ambulatory Visit: Payer: Self-pay | Admitting: Family Medicine

## 2015-05-01 DIAGNOSIS — G4700 Insomnia, unspecified: Secondary | ICD-10-CM

## 2015-05-01 MED ORDER — MIRTAZAPINE 30 MG PO TABS: 30.0000 mg | ORAL_TABLET | Freq: Every day | ORAL | Status: DC

## 2015-05-01 NOTE — Telephone Encounter (Signed)
Medication refilled per protocol. 

## 2015-05-20 ENCOUNTER — Other Ambulatory Visit: Payer: Self-pay | Admitting: Physician Assistant

## 2015-05-20 NOTE — Telephone Encounter (Signed)
Medication refilled per protocol. 

## 2015-06-18 ENCOUNTER — Other Ambulatory Visit: Payer: Self-pay | Admitting: Physician Assistant

## 2015-06-19 NOTE — Telephone Encounter (Signed)
Medication refilled per protocol. 

## 2015-06-19 NOTE — Telephone Encounter (Signed)
Approved # 60 + 2 

## 2015-06-19 NOTE — Telephone Encounter (Signed)
LRF 03/13/15 #60 + 2   LOV 04/02/15  OK refill?

## 2015-07-02 ENCOUNTER — Ambulatory Visit: Payer: 59 | Admitting: Physician Assistant

## 2015-07-10 ENCOUNTER — Encounter: Payer: Self-pay | Admitting: Physician Assistant

## 2015-07-10 ENCOUNTER — Ambulatory Visit (INDEPENDENT_AMBULATORY_CARE_PROVIDER_SITE_OTHER): Payer: 59 | Admitting: Physician Assistant

## 2015-07-10 VITALS — BP 132/78 | HR 56 | Temp 97.5°F | Resp 18 | Ht 61.0 in | Wt 242.0 lb

## 2015-07-10 DIAGNOSIS — I10 Essential (primary) hypertension: Secondary | ICD-10-CM

## 2015-07-10 DIAGNOSIS — K219 Gastro-esophageal reflux disease without esophagitis: Secondary | ICD-10-CM

## 2015-07-10 DIAGNOSIS — E559 Vitamin D deficiency, unspecified: Secondary | ICD-10-CM

## 2015-07-10 DIAGNOSIS — E669 Obesity, unspecified: Secondary | ICD-10-CM

## 2015-07-10 DIAGNOSIS — G4700 Insomnia, unspecified: Secondary | ICD-10-CM | POA: Diagnosis not present

## 2015-07-10 DIAGNOSIS — L239 Allergic contact dermatitis, unspecified cause: Secondary | ICD-10-CM

## 2015-07-10 DIAGNOSIS — F329 Major depressive disorder, single episode, unspecified: Secondary | ICD-10-CM | POA: Diagnosis not present

## 2015-07-10 DIAGNOSIS — F419 Anxiety disorder, unspecified: Secondary | ICD-10-CM

## 2015-07-10 DIAGNOSIS — E785 Hyperlipidemia, unspecified: Secondary | ICD-10-CM

## 2015-07-10 DIAGNOSIS — E1165 Type 2 diabetes mellitus with hyperglycemia: Secondary | ICD-10-CM | POA: Diagnosis not present

## 2015-07-10 DIAGNOSIS — F32A Depression, unspecified: Secondary | ICD-10-CM

## 2015-07-10 DIAGNOSIS — L200 Besnier's prurigo: Secondary | ICD-10-CM

## 2015-07-10 LAB — HEMOGLOBIN A1C, FINGERSTICK: Hgb A1C (fingerstick): 8.8 % — ABNORMAL HIGH (ref <5.7)

## 2015-07-10 MED ORDER — PREDNISONE 20 MG PO TABS: ORAL_TABLET | ORAL | Status: DC

## 2015-07-10 NOTE — Progress Notes (Signed)
Patient ID: ULYSSA WALTHOUR MRN: [id number], DOB: Feb 07, 1961, 55 y.o. Date of Encounter: _0 @  Chief Complaint:  Chief Complaint  Patient presents with  . Follow-up    3 mos    HPI: 55 y.o. year old white female  Presents for follow-up office visit.   She saw me on 09/19/13 as a new patient to establish care. She said that she had been going to [facility] on [facility] in [place]. Said that she had been going there for 30 years. Michela Pitcher that they actually dismissed her (but did not state the reason why).  Michela Pitcher that her last office visit and last labs there were about 3 months prior to her visit here on 09/19/2013. She said that she went there for office visits every 3 months.  However said that she was out of all of her medications except for her diabetes and blood pressure medicines.  Stated that she was seeing no other medical providers except for that family practice. Has seen no gynecologist. Has had no mammogram or colonoscopy or other preventive care.  At that initial visit, she was not fasting so could not check lipid panel. At that visit we just checked CMET and A1c. CMET revealed elevated LFTs. Also showed A1c elevated at 8.9. At that time I said to stop the Crestor and also to find out if she was taking any Tylenol or drinking any alcohol.  At f/u [facility] 11/05/13  she said that she does not remember getting a message to stop the Crestor. Said that she was still taking Crestor. Said that she does not use any Tylenol and does not drink any type of alcohol. At the time of that lab result also said to increase metformin from 500 twice a day to 1000 mg twice a day. She said that she did make this change. Also said to add Actos 45 mg daily. She says that she did add this medication as well. As it turns out, when we rechecked LFTs on 11/05/13-- they were normal. This was on Crestor and we verified that she was on Crestor at the time of these LFTs were normal.  Therefore  we told her to just go ahead and stay on the Crestor and we will monitor LFTs. Today she reports that she is still on the Crestor.  She says that when she checks her blood sugar she always checks it fasting morning. Says that she checks it about once a week.  She did not bring in her blood sugar log with her today.  She had follow-up office visit with me on 11/05/2013 for complete physical exam.  Today she reports that she is taking the Crestor and has been on this this entire time. She is taking other medications as directed as well. Also verified that she is taking aspirin which was added at prior visit with me.  At 06/11/13 OV she reports that she only occasionally checks BS. Did not bring log.  I reveiwed last labs I did here. 03/11/14 A1C was 7.4---said to add Januvia 74m--she says she did add this and IS taking this daily.   At office visit 09/23/14 I reviewed the fact that at her initial visit with me she had told me that in the past, she had been on Zoloft 200 mg but at the time of that initial visit with me she was off Zoloft completely but was needing to resume it. At that time we started Zoloft at 50 mg with plans  to gradually titrate up the dose. However, thus far the 50 mg dose has been continued. Discussed this with her today and she states that the 50 mg dose seems to be working well for her and she does not feel that she needs to increase the dose. Is controlling her symptoms well as causing no adverse effects.  At visit 09/23/14 she does not bring in any type of blood sugar log or blood sugar reading reports. States that she is taking medications as directed. She has been educated regarding proper diet and exercise but is noncompliant with this.  09/23/2014 lab results showed  --LDL had increased significantly. Past labs that showed LDL 64 and 73 the current LDL at that time was 192. At f/u [facility] 12/25/14-- she states that "sometimes forgets to take Crestor ". At [place] 12/25/14-- I discussed  the above lab results and the fact that cholesterol is severely elevated without medication but well controlled with medication and the necessity for her to take Crestor on a daily basis. She voices understanding and agrees. ---Also at lab vitamin D was very low and she was told to take prescription strength weekly for 12 weeks then take 4000 units daily. At [place] 12/25/14--States that she is taking this as directed and I reminded her about using the 4000 units daily when she completes the prescription. --Also at lab A1c was 8.0. For her to continue all current medications and also add Glucotrol XL 10 mg daily. 12/25/14-- she states that she is taking this as directed.  At [place] --12/25/2014--- she did bring in paperwork she has been writing sugar readings.  States that she only checks fasting morning readings. She has the following readings: She has 36 readings----none of these are in the 90s. 3 of these are in the 100s. 5 of these are in the 110s. 5 are these are in the 120s.  She has a few readings less than 90--- one reading that's an 60, one that is 69, 1 that is 82, 1 that is 88. She has 4 readings that are higher at 136, 167, 172, 165  At [facility] 04/02/15: She states that she has stopped Ambien. Says that her family told her that she was doing "crazy things "at night after taking that medicine. States that she does not remember doing and has no recollection of those events. Patient states that she is taking her Crestor now. However she is not fasting today. Says that she drank a "root beer" this morning and had forgotten about needing to be fasting. Also asked about what dose of vitamin D she is currently taking. Reviewed that at lab 09/23/14 she was to do the 50,000 units weekly for 12 weeks then 4000 units daily. She does not seem certain about exactly what she is taking but says that it is prescription. She did not bring in any blood sugars today for me to review. She states that she is taking all medicines  as directed and having no adverse effects other than what is stated about Ambien above.  At [place] 07/10/2015: Reports that she has this itchy rash on her left wrist area that has been there for about a month. Says that she has used hydrocortisone cream and Aveeno but it is not resolving. States that it is itchy. No other complaints or concerns today. Taking all medications as listed/as directed. No adverse effects.   Past Medical History  Diagnosis Date  . Memory impairment   . Allergy   . Asthma 2015  .  Anxiety 2013  . Depression 2013  . Diabetes mellitus 2004  . GERD (gastroesophageal reflux disease) 2013  . Hypertension 1992  . Hyperlipidemia 1992     Home Meds: Outpatient Prescriptions Prior to Visit  Medication Sig Dispense Refill  . aspirin 81 MG tablet Take 1 tablet (81 mg total) by mouth daily. 30 tablet 11  . blood glucose meter kit and supplies KIT Inject 1 each into the skin as directed. Dispense based on patient and insurance preference. Check blood sugar fasting each morning.  ICD E11.65 1 each 0  . Blood Glucose Monitoring Suppl (FREESTYLE LITE) DEVI UAD.E11.65  0  . [facility] D3 2000 units CAPS Take 2 capsules by mouth daily.  3  . glipiZIDE (GLUCOTROL XL) 10 MG 24 hr tablet TAKE 1 TABLET BY MOUTH DAILY WITH BREAKFAST. 30 tablet 5  . glucose blood test strip Dispense based on patient and insurance preference.  Check blood sugar fasting each morning.  ICD E11.65 50 each 12  . JANUVIA 100 MG tablet TAKE 1 TABLET (100 MG TOTAL) BY MOUTH DAILY. 30 tablet 5  . Lancets MISC Dispense based on patient and insurance preference.  Check blood sugar fasting each morning.  ICD E11.65 100 each 3  . lisinopril-hydrochlorothiazide (PRINZIDE,ZESTORETIC) 10-12.5 MG tablet TAKE 1 TABLET EVERY DAY 90 tablet 1  . LORazepam (ATIVAN) 1 MG tablet TAKE 1 TABLET BY MOUTH TWICE A DAY AS NEEDED 60 tablet 2  . metFORMIN (GLUCOPHAGE) 1000 MG tablet Take 1 tablet (1,000 mg total) by mouth 2 (two) times daily  with a meal. 180 tablet 3  . mirtazapine (REMERON) 30 MG tablet Take 1 tablet (30 mg total) by mouth at bedtime. 90 tablet 0  . Nutritional Supplements (ESTROVEN PO) Take 1 tablet by mouth at bedtime. OTC    . omeprazole (PRILOSEC) 20 MG capsule TAKE ONE CAPSULE EVERY DAY 30 capsule 1  . pioglitazone (ACTOS) 45 MG tablet Take 1 tablet (45 mg total) by mouth daily. 90 tablet 0  . rosuvastatin (CRESTOR) 40 MG tablet TAKE 1 TABLET (40 MG TOTAL) BY MOUTH DAILY. 30 tablet 5  . sertraline (ZOLOFT) 50 MG tablet TAKE 1 TABLET EVERY DAY 90 tablet 0  . Vitamin D, Ergocalciferol, (DRISDOL) 50000 UNITS CAPS capsule Take 1 capsule (50,000 Units total) by mouth every 7 (seven) days. 12 capsule 0   No facility-administered medications prior to visit.    Allergies:  Allergies  Allergen Reactions  . Contrast Media [Iodinated Diagnostic Agents] Anaphylaxis  . Ambien [Zolpidem Tartrate] Other (See Comments)    Family has told her that after taking Ambien, she does "crazy things" that she does not remember doing.   . Codeine Nausea And Vomiting    Social History   Social History  . Marital Status: Married    Spouse Name: N/A  . Number of Children: N/A  . Years of Education: N/A   Occupational History  . Not on file.   Social History Main Topics  . Smoking status: Never Smoker   . Smokeless tobacco: Never Used  . Alcohol Use: No  . Drug Use: No  . Sexual Activity: Yes   Other Topics Concern  . Not on file   Social History Narrative   Married.    Lives with husband and youngest son, who is 43 y/o.   Has 4 children.   Does not work outside of home.    No formal exercise. Only activity around the house.   Tries to be careful with  Diabetic diet.    Family History  Problem Relation Age of Onset  . Cancer Father     Lymphoma, Leukemia  . Depression Father   . Cancer Maternal Grandfather   . Cancer Paternal Grandmother   . Diabetes Paternal Grandfather   . Kidney disease Paternal  Grandfather      Review of Systems:  See HPI for pertinent ROS. All other ROS negative.    Physical Exam: Blood pressure 132/78, pulse 56, temperature 97.5 F (36.4 C), temperature source Oral, resp. rate 18, height _0  (1.549 m), weight 242 lb (109.77 kg)., Body mass index is 45.75 kg/(m^2). General: Obese WF. Appears in no acute distress. Neck: Supple. No thyromegaly. No lymphadenopathy. No carotid bruit.  Lungs: Clear bilaterally to auscultation without wheezes, rales, or rhonchi. Breathing is unlabored. Heart: RRR with S1 S2. No murmurs, rubs, or gallops. Abdomen: Soft, non-tender, non-distended with normoactive bowel sounds. No hepatomegaly. No rebound/guarding. No obvious abdominal masses. Musculoskeletal:  Strength and tone normal for age. Extremities/Skin: Warm and dry. Left Wrist/Forearm---Medial aspect with splotchy pink rash. Remainder of skin normal.  Neuro: Alert and oriented X 3. Moves all extremities spontaneously. Gait is normal. CNII-XII grossly in tact. Psych:  Responds to questions appropriately with a normal affect. Diabetic Foot Exam: Inspection is normal.  Sensation is normal and intact.  1+ bilateral posterior tibial pulses and dorsalis pedis pulses.      ASSESSMENT AND PLAN:  55 y.o. year old female with    Allergic dermatitis Discussed that the prednisone will increase her blood sugars and to be extra strict with carb intake while on prednisone. Follow-up if rash does not resolve after completion of prednisone taper. - predniSONE (DELTASONE) 20 MG tablet; Take 3 daily for 2 days, then 2 daily for 2 days, then 1 daily for 2 days.  Dispense: 12 tablet; Refill: 0   1. Type 2 diabetes mellitus with hyperglycemia  On ACE inhibitor On statin On ASA 81 mg daily  Micro albumin was done 11/05/2013, 12/25/2014  She reports that she does have annual diabetic eye exam-had this April or May 2015.  --At [place] 09/23/14 reminded her that this is due and told her to go home  and call to schedule this and wrote this on her AVS --- At [place] 12/25/14 she reports that she still has not had eye exam. Again today, I wrote this on her AVS as a reminder. ---At [place] 04/02/15--- states she has eye exam scheduled for February. Diabetic Foot Exam entered into [employer] 09/19/2013. Again on 09/23/14.   2. Hyperlipidemia She is taking Crestor. She has had elevated LFTs on one of the checks in the past but then they were normal at the most recent LFT check. LFTs norma lagain at check 03/2014 and 09/2014 03/2014 LDL at goal--at 73 09/2014 LDL was up significantly at 192. Discussed at follow-up visit 12/25/14 and patient states that she doesn't remember to take the Crestor daily. Discussed the variation in these lab results and the importance of taking the Crestor daily. She is agreeable to do so. At visit 04/02/15 she states that she is taking the Crestor daily. However she is not fasting today so we'll wait to repeat FLP.   3. Elevated LFTs -See  #2 above and see HPI. LFTs normal 04/2013 and 09/2014   4. Essential hypertension Blood pressure at goal. On ACE inhibitor. BMET normal at lab 09/19/13 and 11/05/13 and 03/11/14 and 09/2014.  5. Vitamin D deficiency On labs 11/05/13 vitamin  D was low at 23. Currently she is on vitamin D. I told her to start taking vitamin D over-the-counter 2000 units daily. Recheck Vitamin D at lab 09/23/2014--I am indeed very low. She was told to take prescription 50,000 units weekly for 12 weeks and when she completes this she is to take over-the-counter vitamin D 4000 units daily. At [place] 12/25/14 she states that she is taking vitamin D as directed and reminded her to take the over-the-counter supplements when she completes the prescription strength. She voices understanding and agrees. At [place] 04/02/15 she does not seem certain about what dose of vitamin D she is taking currently. We'll recheck vitamin D level now.  5. Asthma, mild intermittent,  uncomplicated This is stable and controlled.  6. Gastroesophageal reflux disease, esophagitis presence not specified This is stable and controlled.  7. Anxiety She was on Zoloft 200 mg in past, prior to coming to our office.  At [place] 09/19/2013 discussed: Need to start this back at low dose at 50 mg for couple weeks. Then can increase to 100 mg and then slowly will titrate back up as needed. At [place] 09/19/13 Rxed: - sertraline (ZOLOFT) 50 MG tablet; Take 1 tablet (50 mg total) by mouth daily.  Dispense: 30 tablet; Refill: 0 AT [facility] 09/19/13 Rxed - LORazepam (ATIVAN) 1 MG tablet; Take 1 tablet (1 mg total) by mouth 2 (two) times daily.  Dispense: 60 tablet; Refill: 2  At [place] 09/23/2014---- reviewed with patient that she has continued on the above medications since then. She states that current medications are controlling her symptoms well. She is not feeling anxious or depressed with using Zoloft at 50 mg. Wants to continue current dose and current treatment.  This remains stable and controlled on current medications.   8. Depression See # 7 above. This remains stable and controlled on current medications.  9. Insomnia At visit 04/02/15 she reports that Ambien has been causing her to do crazy things during the night that she does not recall doing. Therefore she has stopped the Ambien. At visit 04/02/15 prescribed Remeron 30 mg QHS. Told her to follow-up if this causes adverse effects or is not effective.  10. GERD (gastroesophageal reflux disease) Symptoms are controlled with omeprazole daily.  11. Asthma - albuterol (PROVENTIL HFA;VENTOLIN HFA) 108 (90 BASE) MCG/ACT inhaler; Inhale 2 puffs into the lungs every 4 (four) hours as needed.  Dispense: 1 Inhaler; Refill: 5  12. Memory Impairment Asked her about this as it was documented in her history. She says that she has had CT scan and evaluation of this.--I reviewed in Epic--2013--had MRI Brain--Findings c/w "Chronic Microvascular Ischemia"       She had complete physical exam with me on 11/05/2013. Visit for preventive health examination She had breast exam and pelvic exam by me on 11/21/13. THE FOLLOWING IS COPIED FROM THOSE VISITS:  A. Screening Labs:  She is fasting today.  - CBC with Differential - COMPLETE METABOLIC PANEL WITH GFR - Lipid panel - TSH - Vit D  25 hydroxy (rtn osteoporosis monitoring)  B. Screening Colonoscopy: She reports that she has never had a screening colonoscopy. I discussed risks versus benefits of having screening colonoscopy. She is agreeable for me to go ahead and schedule referral to GI for her to schedule this. - Ambulatory referral to Gastroenterology At office visit 03/11/2014 patient says that she never heard anything about the GI appointment. At office visit 03/11/2014 I ordered another GI referral. He says that she has gotten  a new phone and does have voice mail so I wonder if this has been a problem in the past. At [place] 06/12/14--Says this was scheduled but had family emergency so cancelled. Has not yet re-scheduled but says she will. At [place] 09/23/2014---Discussed this. Pt states that she does not want to follow-up with colonoscopy. Aware of risks versus benefits but defers. Therefore at today's visit discussed option of doing Hemoccults 3 and she is agreeable. HemeOccult x 3 ordered at [facility] 09/23/2014    C. Pelvic Exam, Pap Smear, Breast Exam, Mammogram She returned on 11/21/2013 and we did breast exam and pelvic exam. Patient says that she did follow-up for the mammogram that was ordered that day. Last mammogram was 11/29/2013------ negative  D. Immunizations: Influenza Vaccine:   12/25/2014 Tetanus:  Tdap given here 11/05/2013.  Pneumonia Vaccine:   Pneumovax 23--given here 11/05/2013 ---------------------------No further Pneumonia Vaccine indicated until Age 63 Zostavax; Discuss at age 62   At office visit 12/25/14" 2 things on her AVS to help remind her to follow-up scheduling: --- Eye  exam --- Mammogram--- last mammogram was 11/29/13 negative  At visit 04/02/15 she reports that she has eye exam scheduled for February. Visit 04/02/15 she says that her insurance does not cover mammograms. Husband is working on trying to find a way for her to have this done. Told her to contact [facility]--- that they have some type of program to help pay for these.  Have her schedule routine follow-up office visit here in 3 months or follow-up sooner if needed.  Signed, [address] [place], [place], [facility] 07/10/2015 11:38 AM

## 2015-07-11 ENCOUNTER — Telehealth: Payer: Self-pay | Admitting: Family Medicine

## 2015-07-11 MED ORDER — CANAGLIFLOZIN 100 MG PO TABS
100.0000 mg | ORAL_TABLET | Freq: Every day | ORAL | Status: DC
Start: 2015-07-11 — End: 2015-10-20

## 2015-07-11 NOTE — Telephone Encounter (Signed)
Discussed lab results with patient.  Understands to watch diet more carefully (carbs)  Rx for Invokana to pharmacy.  Discount card left upfront for patient.  Has 3 month appt

## 2015-07-11 NOTE — Telephone Encounter (Signed)
-----   Message from Dorena BodoMary B Dixon, PA-C sent at 07/10/2015  1:26 PM EDT ----- Add Invokana 300mg  1 po QD.  Tell pt A1C quite high. If not at goal at next check, will have to add INSULIN.  Tell her to be very strict with Carb intake in addition to taking all these meds.  Make sure to have f/u OV 3 months.

## 2015-07-29 ENCOUNTER — Other Ambulatory Visit: Payer: Self-pay | Admitting: Physician Assistant

## 2015-07-30 NOTE — Telephone Encounter (Signed)
Medication refilled per protocol. 

## 2015-08-15 ENCOUNTER — Other Ambulatory Visit: Payer: Self-pay | Admitting: Physician Assistant

## 2015-08-18 NOTE — Telephone Encounter (Signed)
Refill req too soon denied. (lorazepam)

## 2015-08-21 ENCOUNTER — Telehealth: Payer: Self-pay | Admitting: Physician Assistant

## 2015-08-21 NOTE — Telephone Encounter (Signed)
Patient is calling to get refill on her adavan, she said pharmacy told her to call us  [facility]   [phone]

## 2015-08-21 NOTE — Telephone Encounter (Signed)
Should have just picked up last refill 08/19/15.  Called pharmacy, refills resolved.  Pt called no answer.  She has refills at pharmacy

## 2015-08-25 ENCOUNTER — Other Ambulatory Visit: Payer: Self-pay | Admitting: Physician Assistant

## 2015-08-25 NOTE — Telephone Encounter (Signed)
Refill appropriate and filled per protocol. 

## 2015-09-08 ENCOUNTER — Other Ambulatory Visit: Payer: Self-pay | Admitting: Physician Assistant

## 2015-09-22 ENCOUNTER — Other Ambulatory Visit: Payer: Self-pay | Admitting: Physician Assistant

## 2015-09-23 NOTE — Telephone Encounter (Signed)
LRF 06/19/15 #60 + 2   LOV 07/10/15  OK refill?

## 2015-09-23 NOTE — Telephone Encounter (Signed)
rx called in

## 2015-09-23 NOTE — Telephone Encounter (Signed)
Approved # 60 + 2 

## 2015-09-26 ENCOUNTER — Encounter: Payer: Self-pay | Admitting: Physician Assistant

## 2015-10-13 ENCOUNTER — Ambulatory Visit: Payer: 59 | Admitting: Physician Assistant

## 2015-10-15 ENCOUNTER — Other Ambulatory Visit: Payer: Self-pay | Admitting: Family Medicine

## 2015-10-15 MED ORDER — GLIPIZIDE ER 10 MG PO TB24: ORAL_TABLET | ORAL | Status: DC

## 2015-10-15 NOTE — Telephone Encounter (Signed)
Medication refilled per protocol. 

## 2015-10-16 ENCOUNTER — Ambulatory Visit (INDEPENDENT_AMBULATORY_CARE_PROVIDER_SITE_OTHER): Payer: 59 | Admitting: Physician Assistant

## 2015-10-16 ENCOUNTER — Encounter: Payer: Self-pay | Admitting: Physician Assistant

## 2015-10-16 VITALS — BP 164/104 | HR 60 | Temp 97.5°F | Resp 18 | Wt 235.0 lb

## 2015-10-16 DIAGNOSIS — G4700 Insomnia, unspecified: Secondary | ICD-10-CM | POA: Diagnosis not present

## 2015-10-16 DIAGNOSIS — E559 Vitamin D deficiency, unspecified: Secondary | ICD-10-CM | POA: Diagnosis not present

## 2015-10-16 DIAGNOSIS — F32A Depression, unspecified: Secondary | ICD-10-CM

## 2015-10-16 DIAGNOSIS — F419 Anxiety disorder, unspecified: Secondary | ICD-10-CM | POA: Diagnosis not present

## 2015-10-16 DIAGNOSIS — K219 Gastro-esophageal reflux disease without esophagitis: Secondary | ICD-10-CM | POA: Diagnosis not present

## 2015-10-16 DIAGNOSIS — J4520 Mild intermittent asthma, uncomplicated: Secondary | ICD-10-CM

## 2015-10-16 DIAGNOSIS — E785 Hyperlipidemia, unspecified: Secondary | ICD-10-CM

## 2015-10-16 DIAGNOSIS — I10 Essential (primary) hypertension: Secondary | ICD-10-CM

## 2015-10-16 DIAGNOSIS — Z1212 Encounter for screening for malignant neoplasm of rectum: Secondary | ICD-10-CM | POA: Diagnosis not present

## 2015-10-16 DIAGNOSIS — E669 Obesity, unspecified: Secondary | ICD-10-CM

## 2015-10-16 DIAGNOSIS — Z1211 Encounter for screening for malignant neoplasm of colon: Secondary | ICD-10-CM | POA: Diagnosis not present

## 2015-10-16 DIAGNOSIS — E1165 Type 2 diabetes mellitus with hyperglycemia: Secondary | ICD-10-CM

## 2015-10-16 DIAGNOSIS — F329 Major depressive disorder, single episode, unspecified: Secondary | ICD-10-CM | POA: Diagnosis not present

## 2015-10-16 NOTE — Progress Notes (Signed)
[name] MRN: [id number], DOB: Feb 07, 1961, 55 y.o. Date of Encounter: _0 @  Chief Complaint:  Chief Complaint  Patient presents with  . Follow-up    3 mos    HPI: 55 y.o. year old white female  Presents for follow-up office visit.   She saw me on 09/19/13 as a new patient to establish care. She said that she had been going to [facility] on [facility] in [place]. Said that she had been going there for 30 years. Michela Pitcher that they actually dismissed her (but did not state the reason why).  Michela Pitcher that her last office visit and last labs there were about 3 months prior to her visit here on 09/19/2013. She said that she went there for office visits every 3 months.  However said that she was out of all of her medications except for her diabetes and blood pressure medicines.  Stated that she was seeing no other medical providers except for that family practice. Has seen no gynecologist. Has had no mammogram or colonoscopy or other preventive care.  At that initial visit, she was not fasting so could not check lipid panel. At that visit we just checked CMET and A1c. CMET revealed elevated LFTs. Also showed A1c elevated at 8.9. At that time I said to stop the Crestor and also to find out if she was taking any Tylenol or drinking any alcohol.  At f/u OV 11/05/13  she said that she does not remember getting a message to stop the Crestor. Said that she was still taking Crestor. Said that she does not use any Tylenol and does not drink any type of alcohol. At the time of that lab result also said to increase metformin from 500 twice a day to 1000 mg twice a day. She said that she did make this change. Also said to add Actos 45 mg daily. She says that she did add this medication as well. As it turns out, when we rechecked LFTs on 11/05/13-- they were normal. This was on Crestor and we verified that she was on Crestor at the time of these LFTs were normal.  Therefore  we told her to just go ahead and stay on the Crestor and we will monitor LFTs. Today she reports that she is still on the Crestor.  She says that when she checks her blood sugar she always checks it fasting morning. Says that she checks it about once a week.  She did not bring in her blood sugar log with her today.  She had follow-up office visit with me on 11/05/2013 for complete physical exam.  Today she reports that she is taking the Crestor and has been on this this entire time. She is taking other medications as directed as well. Also verified that she is taking aspirin which was added at prior visit with me.  At 55/9/15 OV she reports that she only occasionally checks BS. Did not bring log.  I reveiwed last labs I did here. 03/11/14 A1C was 7.4---said to add Januvia 55m--she says she did add this and IS taking this daily.   At office visit 09/23/14 I reviewed the fact that at her initial visit with me she had told me that in the past, she had been on Zoloft 200 mg but at the time of that initial visit with me she was off Zoloft completely but was needing to resume it. At that time we started Zoloft at 50 mg with plans  to gradually titrate up the dose. However, thus far the 50 mg dose has been continued. Discussed this with her today and she states that the 50 mg dose seems to be working well for her and she does not feel that she needs to increase the dose. Is controlling her symptoms well as causing no adverse effects.  At visit 09/23/14 she does not bring in any type of blood sugar log or blood sugar reading reports. States that she is taking medications as directed. She has been educated regarding proper diet and exercise but is noncompliant with this.  09/23/2014 lab results showed  --LDL had increased significantly. Past labs that showed LDL 64 and 73 the current LDL at that time was 192. At f/u OV 12/25/14-- she states that "sometimes forgets to take Crestor ". At [place] 12/25/14-- I discussed  the above lab results and the fact that cholesterol is severely elevated without medication but well controlled with medication and the necessity for her to take Crestor on a daily basis. She voices understanding and agrees. ---Also at lab vitamin D was very low and she was told to take prescription strength weekly for 12 weeks then take 4000 units daily. At [place] 12/25/14--States that she is taking this as directed and I reminded her about using the 4000 units daily when she completes the prescription. --Also at lab A1c was 8.0. For her to continue all current medications and also add Glucotrol XL 10 mg daily. 12/25/14-- she states that she is taking this as directed.  At [place] --12/25/2014--- she did bring in paperwork she has been writing sugar readings.  States that she only checks fasting morning readings. She has the following readings: She has 36 readings----none of these are in the 90s. 3 of these are in the 100s. 5 of these are in the 110s. 5 are these are in the 120s.  She has a few readings less than 90--- one reading that's an 24, one that is 69, 1 that is 82, 1 that is 88. She has 4 readings that are higher at 136, 167, 172, 165  At [place] 04/02/15: She states that she has stopped Ambien. Says that her family told her that she was doing "crazy things "at night after taking that medicine. States that she does not remember doing and has no recollection of those events. Patient states that she is taking her Crestor now. However she is not fasting today. Says that she drank a "root beer" this morning and had forgotten about needing to be fasting. Also asked about what dose of vitamin D she is currently taking. Reviewed that at lab 09/23/14 she was to do the 50,000 units weekly for 12 weeks then 4000 units daily. She does not seem certain about exactly what she is taking but says that it is prescription. She did not bring in any blood sugars today for me to review. She states that she is taking all medicines  as directed and having no adverse effects other than what is stated about Ambien above.  10/16/2015: Reviewed that at last office visit-- 07/10/15-- A1c was checked and came back 8.8. At that time-- recommended to start Invokana 300 mg daily. Also told patient that if A1c not at goal at recheck with have to add insulin. Told her to be very strict with her carb intake and addition to taking all of the medications. Today she states that she has been taking the Invokana daily. Also reviewed that at the time of her  last lipid panel June 2016 LDL came back 192 and at that time she reported that she had not been taking her Crestor every day. Followed up that issue at other OVs since then. Followed it up again today and she states that she is taking the Crestor every day. She is taking all medications as directed. [place] did review that her blood pressure is high today. She says that she usually takes her medicines in the morning but because she did not eat much today she was going to wait and take medicines with her dinner tonight. Discussed need to take medications at same time every day. As well when we further discussed whether she was fasting-- she is not. So, cannot check FLP.  Past Medical History  Diagnosis Date  . Memory impairment   . Allergy   . Asthma 2015  . Anxiety 2013  . Depression 2013  . Diabetes mellitus 2004  . GERD (gastroesophageal reflux disease) 2013  . Hypertension 1992  . Hyperlipidemia 1992     Home Meds: Outpatient Prescriptions Prior to Visit  Medication Sig Dispense Refill  . aspirin 81 MG tablet Take 1 tablet (81 mg total) by mouth daily. 30 tablet 11  . blood glucose meter kit and supplies KIT Inject 1 each into the skin as directed. Dispense based on patient and insurance preference. Check blood sugar fasting each morning.  ICD E11.65 1 each 0  . Blood Glucose Monitoring Suppl (FREESTYLE LITE) DEVI UAD.E11.65  0  . canagliflozin (INVOKANA) 100 MG TABS tablet  Take 1 tablet (100 mg total) by mouth daily before breakfast. 30 tablet 5  . [facility] D3 2000 units CAPS Take 2 capsules by mouth daily.  3  . glipiZIDE (GLUCOTROL XL) 10 MG 24 hr tablet TAKE 1 TABLET BY MOUTH DAILY WITH BREAKFAST. 90 tablet 1  . glucose blood test strip Dispense based on patient and insurance preference.  Check blood sugar fasting each morning.  ICD E11.65 50 each 12  . JANUVIA 100 MG tablet TAKE 1 TABLET (100 MG TOTAL) BY MOUTH DAILY. 30 tablet 5  . Lancets MISC Dispense based on patient and insurance preference.  Check blood sugar fasting each morning.  ICD E11.65 100 each 3  . lisinopril-hydrochlorothiazide (PRINZIDE,ZESTORETIC) 10-12.5 MG tablet TAKE 1 TABLET EVERY DAY 90 tablet 1  . LORazepam (ATIVAN) 1 MG tablet TAKE 1 TABLET BY MOUTH TWICE A DAY AS NEEDED 60 tablet 2  . metFORMIN (GLUCOPHAGE) 1000 MG tablet Take 1 tablet (1,000 mg total) by mouth 2 (two) times daily with a meal. 180 tablet 3  . mirtazapine (REMERON) 30 MG tablet TAKE 1 TABLET (30 MG TOTAL) BY MOUTH AT BEDTIME. 90 tablet 0  . Nutritional Supplements (ESTROVEN PO) Take 1 tablet by mouth at bedtime. OTC    . omeprazole (PRILOSEC) 20 MG capsule TAKE ONE CAPSULE EVERY DAY 30 capsule 1  . pioglitazone (ACTOS) 45 MG tablet Take 1 tablet (45 mg total) by mouth daily. 90 tablet 0  . predniSONE (DELTASONE) 20 MG tablet Take 3 daily for 2 days, then 2 daily for 2 days, then 1 daily for 2 days. 12 tablet 0  . rosuvastatin (CRESTOR) 40 MG tablet TAKE 1 TABLET (40 MG TOTAL) BY MOUTH DAILY. 30 tablet 5  . sertraline (ZOLOFT) 50 MG tablet TAKE 1 TABLET EVERY DAY 90 tablet 0  . Vitamin D, Ergocalciferol, (DRISDOL) 50000 UNITS CAPS capsule Take 1 capsule (50,000 Units total) by mouth every 7 (seven) days. 12 capsule 0  No facility-administered medications prior to visit.    Allergies:  Allergies  Allergen Reactions  . Contrast Media [Iodinated Diagnostic Agents] Anaphylaxis  . Ambien [Zolpidem Tartrate] Other (See  Comments)    Family has told her that after taking Ambien, she does "crazy things" that she does not remember doing.   . Codeine Nausea And Vomiting    Social History   Social History  . Marital Status: Married    Spouse Name: N/A  . Number of Children: N/A  . Years of Education: N/A   Occupational History  . Not on file.   Social History Main Topics  . Smoking status: Never Smoker   . Smokeless tobacco: Never Used  . Alcohol Use: No  . Drug Use: No  . Sexual Activity: Yes   Other Topics Concern  . Not on file   Social History Narrative   Married.    Lives with husband and youngest son, who is 85 y/o.   Has 4 children.   Does not work outside of home.    No formal exercise. Only activity around the house.   Tries to be careful with Diabetic diet.    Family History  Problem Relation Age of Onset  . Cancer Father     Lymphoma, Leukemia  . Depression Father   . Cancer Maternal Grandfather   . Cancer Paternal Grandmother   . Diabetes Paternal Grandfather   . Kidney disease Paternal Grandfather      Review of Systems:  See HPI for pertinent ROS. All other ROS negative.    Physical Exam: Blood pressure 164/104, pulse 60, temperature 97.5 F (36.4 C), temperature source Oral, resp. rate 18, weight 235 lb (106.595 kg)., Body mass index is 44.43 kg/(m^2). General: Obese WF. Appears in no acute distress. Neck: Supple. No thyromegaly. No lymphadenopathy. No carotid bruit.  Lungs: Clear bilaterally to auscultation without wheezes, rales, or rhonchi. Breathing is unlabored. Heart: RRR with S1 S2. No murmurs, rubs, or gallops. Abdomen: Soft, non-tender, non-distended with normoactive bowel sounds. No hepatomegaly. No rebound/guarding. No obvious abdominal masses. Musculoskeletal:  Strength and tone normal for age. Extremities/Skin: Warm and dry.  Neuro: Alert and oriented X 3. Moves all extremities spontaneously. Gait is normal. CNII-XII grossly in tact. Psych:  Responds  to questions appropriately with a normal affect. Diabetic Foot Exam: Inspection is normal.  Sensation is normal and intact.  1+ bilateral posterior tibial pulses and dorsalis pedis pulses.      ASSESSMENT AND PLAN:  55 y.o. year old female with    1. Type 2 diabetes mellitus with hyperglycemia  On ACE inhibitor On statin On ASA 81 mg daily  Micro albumin was done 11/05/2013, 12/25/2014  She reports that she does have annual diabetic eye exam-had this April or May 2015.  --At [place] 09/23/14 reminded her that this is due and told her to go home and call to schedule this and wrote this on her AVS --- At [place] 12/25/14 she reports that she still has not had eye exam. Again today, I wrote this on her AVS as a reminder. ---At [place] 04/02/15--- states she has eye exam scheduled for February. Diabetic Foot Exam entered into [employer] 09/19/2013. Again on 09/23/14.   2. Hyperlipidemia She is taking Crestor. She has had elevated LFTs on one of the checks in the past but then they were normal at the most recent LFT check. LFTs norma lagain at check 03/2014 and 09/2014 03/2014 LDL at goal--at 73 09/2014 LDL was  up significantly at 192. Discussed at follow-up visit 12/25/14 and patient states that she doesn't remember to take the Crestor daily. Discussed the variation in these lab results and the importance of taking the Crestor daily. She is agreeable to do so. At visit 04/02/15 she states that she is taking the Crestor daily. However she is not fasting today so we'll wait to repeat FLP.   3. Elevated LFTs -See  #2 above and see HPI. LFTs normal 04/2013 and 09/2014   4. Essential hypertension Blood pressure is elevated at office visit 10/16/15. However I reviewed that her last office visit blood pressure was well controlled at 132/78. She has not taken her medications yet for today and it is almost 3:00 PM. Usually takes medications in the morning. Told her to take her medications at the same time of day on  a routine basis and that these are 24 hour acting medicines. In addition should not be taking today's dose in the evening and in taking medicines again tomorrow morning well before 24 hours between dosing--pt voices understanding and agrees  On ACE inhibitor. BMET normal at lab 09/19/13 and 11/05/13 and 03/11/14 and 09/2014.  5. Vitamin D deficiency On labs 11/05/13 vitamin D was low at 23. Currently she is on vitamin D. I told her to start taking vitamin D over-the-counter 2000 units daily. Recheck Vitamin D at lab 09/23/2014--I am indeed very low. She was told to take prescription 50,000 units weekly for 12 weeks and when she completes this she is to take over-the-counter vitamin D 4000 units daily. At [place] 12/25/14 she states that she is taking vitamin D as directed and reminded her to take the over-the-counter supplements when she completes the prescription strength. She voices understanding and agrees. At [place] 04/02/15 she does not seem certain about what dose of vitamin D she is taking currently. We'll recheck vitamin D level now.  5. Asthma, mild intermittent, uncomplicated This is stable and controlled.  6. Gastroesophageal reflux disease, esophagitis presence not specified This is stable and controlled.  7. Anxiety She was on Zoloft 200 mg in past, prior to coming to our office.  At [place] 09/19/2013 discussed: Need to start this back at low dose at 50 mg for couple weeks. Then can increase to 100 mg and then slowly will titrate back up as needed. At [place] 09/19/13 Rxed: - sertraline (ZOLOFT) 50 MG tablet; Take 1 tablet (50 mg total) by mouth daily.  Dispense: 30 tablet; Refill: 0 AT [facility] 09/19/13 Rxed - LORazepam (ATIVAN) 1 MG tablet; Take 1 tablet (1 mg total) by mouth 2 (two) times daily.  Dispense: 60 tablet; Refill: 2  At [place] 09/23/2014---- reviewed with patient that she has continued on the above medications since then. She states that current medications are controlling her symptoms well. She is not  feeling anxious or depressed with using Zoloft at 50 mg. Wants to continue current dose and current treatment.  This remains stable and controlled on current medications.   8. Depression See # 7 above. This remains stable and controlled on current medications.  9. Insomnia At visit 04/02/15 she reports that Ambien has been causing her to do crazy things during the night that she does not recall doing. Therefore she has stopped the Ambien. At visit 04/02/15 prescribed Remeron 30 mg QHS. Told her to follow-up if this causes adverse effects or is not effective.  10. GERD (gastroesophageal reflux disease) Symptoms are controlled with omeprazole daily.  11. Asthma - albuterol (PROVENTIL  HFA;VENTOLIN HFA) 108 (90 BASE) MCG/ACT inhaler; Inhale 2 puffs into the lungs every 4 (four) hours as needed.  Dispense: 1 Inhaler; Refill: 5  12. Memory Impairment Asked her about this as it was documented in her history. She says that she has had CT scan and evaluation of this.--I reviewed in Epic--2013--had MRI Brain--Findings c/w "Chronic Microvascular Ischemia"      She had complete physical exam with me on 11/05/2013. Visit for preventive health examination She had breast exam and pelvic exam by me on 11/21/13. THE FOLLOWING IS COPIED FROM THOSE VISITS:  A. Screening Labs:  She is fasting today.  - CBC with Differential - COMPLETE METABOLIC PANEL WITH GFR - Lipid panel - TSH - Vit D  25 hydroxy (rtn osteoporosis monitoring)  B. Screening Colonoscopy: She reports that she has never had a screening colonoscopy. I discussed risks versus benefits of having screening colonoscopy. She is agreeable for me to go ahead and schedule referral to GI for her to schedule this. - Ambulatory referral to Gastroenterology At office visit 03/11/2014 patient says that she never heard anything about the GI appointment. At office visit 03/11/2014 I ordered another GI referral. He says that she has gotten a new phone  and does have voice mail so I wonder if this has been a problem in the past. At [place] 06/12/14--Says this was scheduled but had family emergency so cancelled. Has not yet re-scheduled but says she will. At [place] 09/23/2014---Discussed this. Pt states that she does not want to follow-up with colonoscopy. Aware of risks versus benefits but defers. Therefore at today's visit discussed option of doing Hemoccults 3 and she is agreeable. HemeOccult x 3 ordered at [facility] 09/23/2014    C. Pelvic Exam, Pap Smear, Breast Exam, Mammogram She returned on 11/21/2013 and we did breast exam and pelvic exam. Patient says that she did follow-up for the mammogram that was ordered that day. Last mammogram was 11/29/2013------ negative  D. Immunizations: Influenza Vaccine:   12/25/2014 Tetanus:  Tdap given here 11/05/2013.  Pneumonia Vaccine:   Pneumovax 23--given here 11/05/2013 ---------------------------No further Pneumonia Vaccine indicated until Age 40 Zostavax; Discuss at age 75   At office visit 12/25/14" 2 things on her AVS to help remind her to follow-up scheduling: --- Eye exam --- Mammogram--- last mammogram was 11/29/13 negative  At visit 04/02/15 she reports that she has eye exam scheduled for February. Visit 04/02/15 she says that her insurance does not cover mammograms. Husband is working on trying to find a way for her to have this done. Told her to contact [facility]--- that they have some type of program to help pay for these.  Have her schedule routine follow-up office visit here in 3 months or follow-up sooner if needed. At [place] 10/2015---Told her to schedule the next office visit early morning and come fasting  Signed, [address] [place], [place], [facility] 10/16/2015 2:16 PM

## 2015-10-17 LAB — COMPLETE METABOLIC PANEL WITH GFR
ALT: 30 U/L — ABNORMAL HIGH (ref 6–29)
AST: 32 U/L (ref 10–35)
Albumin: 3.9 g/dL (ref 3.6–5.1)
Alkaline Phosphatase: 61 U/L (ref 33–130)
BUN: 11 mg/dL (ref 7–25)
CHLORIDE: 107 mmol/L (ref 98–110)
CO2: 22 mmol/L (ref 20–31)
Calcium: 9.1 mg/dL (ref 8.6–10.4)
Creat: 0.75 mg/dL (ref 0.50–1.05)
GFR, Est African American: >89 mL/min (ref >=60)
GLUCOSE: 172 mg/dL — AB (ref 70–99)
POTASSIUM: 3.9 mmol/L (ref 3.5–5.3)
SODIUM: 135 mmol/L (ref 135–146)
TOTAL PROTEIN: 6.5 g/dL (ref 6.1–8.1)
Total Bilirubin: 0.4 mg/dL (ref 0.2–1.2)

## 2015-10-17 LAB — HEMOGLOBIN A1C
Hgb A1c MFr Bld: 7.3 % — ABNORMAL HIGH (ref <5.7)
MEAN PLASMA GLUCOSE: 163 mg/dL

## 2015-10-20 ENCOUNTER — Telehealth: Payer: Self-pay | Admitting: *Deleted

## 2015-11-02 ENCOUNTER — Other Ambulatory Visit: Payer: Self-pay | Admitting: Physician Assistant

## 2015-11-03 NOTE — Telephone Encounter (Signed)
Medication refilled per protocol. 

## 2015-11-24 ENCOUNTER — Other Ambulatory Visit: Payer: Self-pay | Admitting: Physician Assistant

## 2015-11-24 NOTE — Telephone Encounter (Signed)
Medication refilled per protocol. 

## 2016-01-02 ENCOUNTER — Other Ambulatory Visit: Payer: Self-pay | Admitting: Physician Assistant

## 2016-01-05 NOTE — Telephone Encounter (Signed)
Rx refilled per protocol/ faxed 

## 2016-01-05 NOTE — Telephone Encounter (Signed)
Approved. # [id number] + 0. 

## 2016-01-05 NOTE — Telephone Encounter (Signed)
Last OV 10-16-15 Last refill 09-23-15 Ok to refill?

## 2016-01-07 ENCOUNTER — Other Ambulatory Visit: Payer: Self-pay | Admitting: Physician Assistant

## 2016-01-19 ENCOUNTER — Ambulatory Visit (INDEPENDENT_AMBULATORY_CARE_PROVIDER_SITE_OTHER): Payer: 59 | Admitting: Physician Assistant

## 2016-01-19 ENCOUNTER — Encounter: Payer: Self-pay | Admitting: Physician Assistant

## 2016-01-19 VITALS — BP 138/100 | HR 86 | Temp 97.6°F | Resp 14 | Wt 231.0 lb

## 2016-01-19 DIAGNOSIS — E1165 Type 2 diabetes mellitus with hyperglycemia: Secondary | ICD-10-CM | POA: Diagnosis not present

## 2016-01-19 DIAGNOSIS — F329 Major depressive disorder, single episode, unspecified: Secondary | ICD-10-CM

## 2016-01-19 DIAGNOSIS — E559 Vitamin D deficiency, unspecified: Secondary | ICD-10-CM | POA: Diagnosis not present

## 2016-01-19 DIAGNOSIS — K219 Gastro-esophageal reflux disease without esophagitis: Secondary | ICD-10-CM

## 2016-01-19 DIAGNOSIS — Z1239 Encounter for other screening for malignant neoplasm of breast: Secondary | ICD-10-CM

## 2016-01-19 DIAGNOSIS — J4520 Mild intermittent asthma, uncomplicated: Secondary | ICD-10-CM

## 2016-01-19 DIAGNOSIS — Z1231 Encounter for screening mammogram for malignant neoplasm of breast: Secondary | ICD-10-CM | POA: Diagnosis not present

## 2016-01-19 DIAGNOSIS — F419 Anxiety disorder, unspecified: Secondary | ICD-10-CM | POA: Diagnosis not present

## 2016-01-19 DIAGNOSIS — F32A Depression, unspecified: Secondary | ICD-10-CM

## 2016-01-19 DIAGNOSIS — G4700 Insomnia, unspecified: Secondary | ICD-10-CM

## 2016-01-19 DIAGNOSIS — Z1211 Encounter for screening for malignant neoplasm of colon: Secondary | ICD-10-CM | POA: Diagnosis not present

## 2016-01-19 DIAGNOSIS — E785 Hyperlipidemia, unspecified: Secondary | ICD-10-CM | POA: Diagnosis not present

## 2016-01-19 DIAGNOSIS — Z23 Encounter for immunization: Secondary | ICD-10-CM | POA: Diagnosis not present

## 2016-01-19 DIAGNOSIS — I10 Essential (primary) hypertension: Secondary | ICD-10-CM

## 2016-01-19 DIAGNOSIS — Z1212 Encounter for screening for malignant neoplasm of rectum: Secondary | ICD-10-CM

## 2016-01-19 LAB — LIPID PANEL
CHOL/HDL RATIO: 3 ratio (ref <=5.0)
Cholesterol: 173 mg/dL (ref 125–200)
HDL: 57 mg/dL (ref >=46)
LDL Cholesterol: 87 mg/dL (ref <130)
Triglycerides: 143 mg/dL (ref <150)
VLDL: 29 mg/dL (ref <30)

## 2016-01-19 LAB — COMPLETE METABOLIC PANEL WITH GFR
ALT: 23 U/L (ref 6–29)
AST: 22 U/L (ref 10–35)
Albumin: 4.3 g/dL (ref 3.6–5.1)
Alkaline Phosphatase: 67 U/L (ref 33–130)
BUN: 14 mg/dL (ref 7–25)
CHLORIDE: 102 mmol/L (ref 98–110)
CO2: 26 mmol/L (ref 20–31)
Calcium: 9.6 mg/dL (ref 8.6–10.4)
Creat: 0.83 mg/dL (ref 0.50–1.05)
GFR, EST NON AFRICAN AMERICAN: 80 mL/min (ref >=60)
GFR, Est African American: >89 mL/min (ref >=60)
GLUCOSE: 132 mg/dL — AB (ref 70–99)
POTASSIUM: 4.1 mmol/L (ref 3.5–5.3)
SODIUM: 138 mmol/L (ref 135–146)
Total Bilirubin: 0.8 mg/dL (ref 0.2–1.2)
Total Protein: 7.6 g/dL (ref 6.1–8.1)

## 2016-01-19 MED ORDER — SUVOREXANT 15 MG PO TABS: 1.0000 | ORAL_TABLET | Freq: Every evening | ORAL | 2 refills | Status: DC | PRN

## 2016-01-19 MED ORDER — LISINOPRIL-HYDROCHLOROTHIAZIDE 20-12.5 MG PO TABS: 1.0000 | ORAL_TABLET | Freq: Every day | ORAL | 3 refills | Status: DC

## 2016-01-19 NOTE — Progress Notes (Signed)
[name] MRN: [id number], DOB: Feb 07, 1961, 55 y.o. Date of Encounter: _0 @  Chief Complaint:  Chief Complaint  Patient presents with  . Follow-up    3 mos    HPI: 55 y.o. year old white female  Presents for follow-up office visit.   She saw me on 09/19/13 as a new patient to establish care. She said that she had been going to [facility] on [facility] in [place]. Said that she had been going there for 30 years. Michela Pitcher that they actually dismissed her (but did not state the reason why).  Michela Pitcher that her last office visit and last labs there were about 3 months prior to her visit here on 09/19/2013. She said that she went there for office visits every 3 months.  However said that she was out of all of her medications except for her diabetes and blood pressure medicines.  Stated that she was seeing no other medical providers except for that family practice. Has seen no gynecologist. Has had no mammogram or colonoscopy or other preventive care.  At that initial visit, she was not fasting so could not check lipid panel. At that visit we just checked CMET and A1c. CMET revealed elevated LFTs. Also showed A1c elevated at 8.9. At that time I said to stop the Crestor and also to find out if she was taking any Tylenol or drinking any alcohol.  At f/u OV 55/3/15  she said that she does not remember getting a message to stop the Crestor. Said that she was still taking Crestor. Said that she does not use any Tylenol and does not drink any type of alcohol. At the time of that lab result also said to increase metformin from 500 twice a day to 1000 mg twice a day. She said that she did make this change. Also said to add Actos 45 mg daily. She says that she did add this medication as well. As it turns out, when we rechecked LFTs on 11/05/13-- they were normal. This was on Crestor and we verified that she was on Crestor at the time of these LFTs were normal.  Therefore  we told her to just go ahead and stay on the Crestor and we will monitor LFTs. Today she reports that she is still on the Crestor.  She says that when she checks her blood sugar she always checks it fasting morning. Says that she checks it about once a week.  She did not bring in her blood sugar log with her today.  She had follow-up office visit with me on 11/05/2013 for complete physical exam.  Today she reports that she is taking the Crestor and has been on this this entire time. She is taking other medications as directed as well. Also verified that she is taking aspirin which was added at prior visit with me.  At 06/11/13 OV she reports that she only occasionally checks BS. Did not bring log.  I reveiwed last labs I did here. 03/11/14 A1C was 7.4---said to add Januvia 74m--she says she did add this and IS taking this daily.   At office visit 55/20/16 I reviewed the fact that at her initial visit with me she had told me that in the past, she had been on Zoloft 200 mg but at the time of that initial visit with me she was off Zoloft completely but was needing to resume it. At that time we started Zoloft at 50 mg with plans  to gradually titrate up the dose. However, thus far the 50 mg dose has been continued. Discussed this with her today and she states that the 50 mg dose seems to be working well for her and she does not feel that she needs to increase the dose. Is controlling her symptoms well as causing no adverse effects.  At visit 09/23/14 she does not bring in any type of blood sugar log or blood sugar reading reports. States that she is taking medications as directed. She has been educated regarding proper diet and exercise but is noncompliant with this.  09/23/2014 lab results showed  --LDL had increased significantly. Past labs that showed LDL 64 and 73 the current LDL at that time was 192. At f/u OV 12/25/14-- she states that "sometimes forgets to take Crestor ". At [place] 12/25/14-- I discussed  the above lab results and the fact that cholesterol is severely elevated without medication but well controlled with medication and the necessity for her to take Crestor on a daily basis. She voices understanding and agrees. ---Also at lab vitamin D was very low and she was told to take prescription strength weekly for 12 weeks then take 4000 units daily. At [place] 12/25/14--States that she is taking this as directed and I reminded her about using the 4000 units daily when she completes the prescription. --Also at lab A1c was 8.0. For her to continue all current medications and also add Glucotrol XL 10 mg daily. 12/25/14-- she states that she is taking this as directed.  At [place] --12/25/2014--- she did bring in paperwork she has been writing sugar readings.  States that she only checks fasting morning readings. She has the following readings: She has 36 readings----none of these are in the 90s. 3 of these are in the 100s. 5 of these are in the 110s. 5 are these are in the 120s.  She has a few readings less than 90--- one reading that's an 24, one that is 69, 1 that is 82, 1 that is 88. She has 4 readings that are higher at 136, 167, 172, 165  At [place] 04/02/15: She states that she has stopped Ambien. Says that her family told her that she was doing "crazy things "at night after taking that medicine. States that she does not remember doing and has no recollection of those events. Patient states that she is taking her Crestor now. However she is not fasting today. Says that she drank a "root beer" this morning and had forgotten about needing to be fasting. Also asked about what dose of vitamin D she is currently taking. Reviewed that at lab 09/23/14 she was to do the 50,000 units weekly for 12 weeks then 4000 units daily. She does not seem certain about exactly what she is taking but says that it is prescription. She did not bring in any blood sugars today for me to review. She states that she is taking all medicines  as directed and having no adverse effects other than what is stated about Ambien above.  10/16/2015: Reviewed that at last office visit-- 07/10/15-- A1c was checked and came back 8.8. At that time-- recommended to start Invokana 300 mg daily. Also told patient that if A1c not at goal at recheck with have to add insulin. Told her to be very strict with her carb intake and addition to taking all of the medications. Today she states that she has been taking the Invokana daily. Also reviewed that at the time of her  last lipid panel June 2016 LDL came back 192 and at that time she reported that she had not been taking her Crestor every day. Followed up that issue at other OVs since then. Followed it up again today and she states that she is taking the Crestor every day. She is taking all medications as directed. However did review that her blood pressure is high today. She says that she usually takes her medicines in the morning but because she did not eat much today she was going to wait and take medicines with her dinner tonight. Discussed need to take medications at same time every day. As well when we further discussed whether she was fasting-- she is not. So, cannot check FLP.  01/19/2016: She has no complaints or concerns today. She is taking blood pressure medications as directed. No lightheadedness. Initially she had no complaints but then once I mention her blood pressure being somewhat high again today, as it was at last visit, she says that she has not been getting much sleep and asked if that could be affecting her high blood pressure. Says that the Remeron has no effect. She takes it but doesn't notice any difference. Says that she is getting very little sleep. I asked if she is napping during the day but she says no she does not nap. Says that she checks her blood sugar a couple of times a week fasting morning and gets usually around 130 - 140 - 150. However says that this morning it was  230. She is taking diabetic medicines as directed with no adverse effects. She is fasting today. She is taking cholesterol medication as directed with no adverse effects. Current dose of Zoloft is still working in controlling her anxiety and depression. Causing no adverse effects.  Past Medical History:  Diagnosis Date  . Allergy   . Anxiety 2013  . Asthma 2015  . Depression 2013  . Diabetes mellitus 2004  . GERD (gastroesophageal reflux disease) 2013  . Hyperlipidemia 1992  . Hypertension 1992  . Memory impairment      Home Meds: Outpatient Medications Prior to Visit  Medication Sig Dispense Refill  . aspirin 81 MG tablet Take 1 tablet (81 mg total) by mouth daily. 30 tablet 11  . blood glucose meter kit and supplies KIT Inject 1 each into the skin as directed. Dispense based on patient and insurance preference. Check blood sugar fasting each morning.  ICD E11.65 1 each 0  . Blood Glucose Monitoring Suppl (FREESTYLE LITE) DEVI UAD.E11.65  0  . canagliflozin (INVOKANA) 300 MG TABS tablet Take 300 mg by mouth daily before breakfast.    . [facility] D3 2000 units CAPS Take 2 capsules by mouth daily.  3  . glipiZIDE (GLUCOTROL XL) 10 MG 24 hr tablet TAKE 1 TABLET BY MOUTH DAILY WITH BREAKFAST. 90 tablet 1  . glucose blood test strip Dispense based on patient and insurance preference.  Check blood sugar fasting each morning.  ICD E11.65 50 each 12  . JANUVIA 100 MG tablet TAKE 1 TABLET (100 MG TOTAL) BY MOUTH DAILY. 30 tablet 5  . Lancets MISC Dispense based on patient and insurance preference.  Check blood sugar fasting each morning.  ICD E11.65 100 each 3  . lisinopril-hydrochlorothiazide (PRINZIDE,ZESTORETIC) 10-12.5 MG tablet TAKE 1 TABLET EVERY DAY 90 tablet 1  . LORazepam (ATIVAN) 1 MG tablet TAKE 1 TABLET BY MOUTH TWICE A DAY AS NEEDED 60 tablet 0  . metFORMIN (GLUCOPHAGE) 1000 MG tablet Take  1 tablet (1,000 mg total) by mouth 2 (two) times daily with a meal. 180 tablet 3  . mirtazapine  (REMERON) 30 MG tablet TAKE 1 TABLET (30 MG TOTAL) BY MOUTH AT BEDTIME. 90 tablet 0  . Nutritional Supplements (ESTROVEN PO) Take 1 tablet by mouth at bedtime. OTC    . omeprazole (PRILOSEC) 20 MG capsule TAKE ONE CAPSULE EVERY DAY 30 capsule 1  . pioglitazone (ACTOS) 45 MG tablet Take 1 tablet (45 mg total) by mouth daily. 90 tablet 0  . predniSONE (DELTASONE) 20 MG tablet Take 3 daily for 2 days, then 2 daily for 2 days, then 1 daily for 2 days. 12 tablet 0  . rosuvastatin (CRESTOR) 40 MG tablet TAKE 1 TABLET (40 MG TOTAL) BY MOUTH DAILY. 30 tablet 5  . sertraline (ZOLOFT) 50 MG tablet TAKE 1 TABLET EVERY DAY 90 tablet 0   No facility-administered medications prior to visit.     Allergies:  Allergies  Allergen Reactions  . Contrast Media [Iodinated Diagnostic Agents] Anaphylaxis  . Ambien [Zolpidem Tartrate] Other (See Comments)    Family has told her that after taking Ambien, she does "crazy things" that she does not remember doing.   . Codeine Nausea And Vomiting    Social History   Social History  . Marital status: Married    Spouse name: N/A  . Number of children: N/A  . Years of education: N/A   Occupational History  . Not on file.   Social History Main Topics  . Smoking status: Never Smoker  . Smokeless tobacco: Never Used  . Alcohol use No  . Drug use: No  . Sexual activity: Yes   Other Topics Concern  . Not on file   Social History Narrative   Married.    Lives with husband and youngest son, who is 28 y/o.   Has 4 children.   Does not work outside of home.    No formal exercise. Only activity around the house.   Tries to be careful with Diabetic diet.    Family History  Problem Relation Age of Onset  . Cancer Father     Lymphoma, Leukemia  . Depression Father   . Cancer Maternal Grandfather   . Cancer Paternal Grandmother   . Diabetes Paternal Grandfather   . Kidney disease Paternal Grandfather      Review of Systems:  See HPI for pertinent ROS.  All other ROS negative.    Physical Exam: Blood pressure (!) 138/100, weight 231 lb (104.8 kg)., Body mass index is 43.65 kg/m. General: Obese WF. Appears in no acute distress. Neck: Supple. No thyromegaly. No lymphadenopathy. No carotid bruit.  Lungs: Clear bilaterally to auscultation without wheezes, rales, or rhonchi. Breathing is unlabored. Heart: RRR with S1 S2. No murmurs, rubs, or gallops. Abdomen: Soft, non-tender, non-distended with normoactive bowel sounds. No hepatomegaly. No rebound/guarding. No obvious abdominal masses. Musculoskeletal:  Strength and tone normal for age. Extremities/Skin: Warm and dry.  Neuro: Alert and oriented X 3. Moves all extremities spontaneously. Gait is normal. CNII-XII grossly in tact. Psych:  Responds to questions appropriately with a normal affect. Diabetic Foot Exam: Inspection is normal.  Sensation is normal and intact.  1+ bilateral posterior tibial pulses and dorsalis pedis pulses.      ASSESSMENT AND PLAN:  55 y.o. year old female with    1. Type 2 diabetes mellitus with hyperglycemia  On ACE inhibitor On statin On ASA 81 mg daily  Micro albumin was done 11/05/2013,  12/25/2014, 01/19/2016  She reports that she does have annual diabetic eye exam-had this April or May 2015.  --At [place] 09/23/14 reminded her that this is due and told her to go home and call to schedule this and wrote this on her AVS --- At [place] 12/25/14 she reports that she still has not had eye exam. Again today, I wrote this on her AVS as a reminder. ---At [place] 04/02/15--- states she has eye exam scheduled for February. Diabetic Foot Exam entered into [employer] 09/19/2013. Again on 09/23/14.   2. Hyperlipidemia She is taking Crestor. She has had elevated LFTs on one of the checks in the past but then they were normal at the most recent LFT check. LFTs norma lagain at check 03/2014 and 09/2014 03/2014 LDL at goal--at 73 09/2014 LDL was up significantly at 192. Discussed at  follow-up visit 12/25/14 and patient states that she doesn't remember to take the Crestor daily. Discussed the variation in these lab results and the importance of taking the Crestor daily. She is agreeable to do so. At visit 04/02/15 she states that she is taking the Crestor daily. However she is not fasting today so we'll wait to repeat FLP. At visit 01/2016 she is fasting so FLP rechecked.   3. Elevated LFTs -See  #2 above and see HPI. LFTs normal 04/2013 and 09/2014   4. Essential hypertension BP was slightly elevated at visit 10/2015 and is slightly elevated again 01/2016. At that visit lisinopril was increased from 10/12.5 up to 20/12.5. Last Potassium was good at 3.9.  5. Vitamin D deficiency On labs 11/05/13 vitamin D was low at 23. Currently she is on vitamin D. I told her to start taking vitamin D over-the-counter 2000 units daily. Recheck Vitamin D at lab 09/23/2014--I am indeed very low. She was told to take prescription 50,000 units weekly for 12 weeks and when she completes this she is to take over-the-counter vitamin D 4000 units daily. At [place] 12/25/14 she states that she is taking vitamin D as directed and reminded her to take the over-the-counter supplements when she completes the prescription strength. She voices understanding and agrees. At [place] 04/02/15 she does not seem certain about what dose of vitamin D she is taking currently. We'll recheck vitamin D level now.  5. Asthma, mild intermittent, uncomplicated This is stable and controlled.  6. Gastroesophageal reflux disease, esophagitis presence not specified This is stable and controlled.  7. Anxiety She was on Zoloft 200 mg in past, prior to coming to our office.  At [place] 09/19/2013 discussed: Need to start this back at low dose at 50 mg for couple weeks. Then can increase to 100 mg and then slowly will titrate back up as needed.  At [place] 09/23/2014---- reviewed with patient that she has continued on the above medications since  then. She states that current medications are controlling her symptoms well. She is not feeling anxious or depressed with using Zoloft at 50 mg. Wants to continue current dose and current treatment.  This remains stable and controlled on current medications.   8. Depression See # 7 above. This remains stable and controlled on current medications.  9. Insomnia At visit 04/02/15 she reports that Ambien has been causing her to do crazy things during the night that she does not recall doing. Therefore she has stopped the Ambien. At visit 04/02/15 prescribed Remeron 30 mg QHS. Told her to follow-up if this causes adverse effects or is not effective. At visit 01/2016  she states that the Remeron is ineffective. At visit changed her to [place].  10. GERD (gastroesophageal reflux disease) Symptoms are controlled with omeprazole daily.  11. Asthma This is stable/controlled with current medication.  She had complete physical exam with me on 11/05/2013. Visit for preventive health examination She had breast exam and pelvic exam by me on 11/21/13. THE FOLLOWING IS COPIED FROM THOSE VISITS:  A. Screening Labs:  She is fasting today.  - CBC with Differential - COMPLETE METABOLIC PANEL WITH GFR - Lipid panel - TSH - Vit D  25 hydroxy (rtn osteoporosis monitoring)  B. Screening Colonoscopy: She reports that she has never had a screening colonoscopy. I discussed risks versus benefits of having screening colonoscopy. She is agreeable for me to go ahead and schedule referral to GI for her to schedule this. - Ambulatory referral to Gastroenterology At office visit 03/11/2014 patient says that she never heard anything about the GI appointment. At office visit 03/11/2014 I ordered another GI referral. He says that she has gotten a new phone and does have voice mail so I wonder if this has been a problem in the past. At [place] 06/12/14--Says this was scheduled but had family emergency so cancelled. Has not yet  re-scheduled but says she will. At [place] 09/23/2014---Discussed this. Pt states that she does not want to follow-up with colonoscopy. Aware of risks versus benefits but defers. Therefore at today's visit discussed option of doing Hemoccults 3 and she is agreeable. HemeOccult x 3 ordered at [facility] 09/23/2014    C. Pelvic Exam, Pap Smear, Breast Exam, Mammogram She returned on 11/21/2013 and we did breast exam and pelvic exam. Patient says that she did follow-up for the mammogram that was ordered that day. Last mammogram was 11/29/2013------ negative At visit 12/25/2014 I had written on her AVS to remind her to schedule a mammogram. At visit 01/2016 she states that she never had the mammogram. That visit she told me I could go ahead and place the order for this. I did place order for mammogram at this visit.  D. Immunizations: Influenza Vaccine:   12/25/2014 Tetanus:  Tdap given here 11/05/2013.  Pneumonia Vaccine:   Pneumovax 23--given here 11/05/2013 ---------------------------No further Pneumonia Vaccine indicated until Age 53 Zostavax; Discuss at age 75   Routine follow-up office visit here in 3 months or follow-up sooner if needed.   Signed, [address] [place], [place], [facility] 01/19/2016 8:54 AM

## 2016-01-20 LAB — HEMOGLOBIN A1C
HEMOGLOBIN A1C: 7.2 % — AB (ref <5.7)
MEAN PLASMA GLUCOSE: 160 mg/dL

## 2016-01-29 ENCOUNTER — Other Ambulatory Visit: Payer: Self-pay | Admitting: Physician Assistant

## 2016-01-30 ENCOUNTER — Telehealth: Payer: Self-pay

## 2016-01-30 NOTE — Telephone Encounter (Addendum)
Pt was calling to  See if she was to be taking mirtazapine. Pt was unfamiliar with the generic name  Pt is taking the correct RX

## 2016-02-02 ENCOUNTER — Ambulatory Visit: Payer: Self-pay

## 2016-02-03 ENCOUNTER — Other Ambulatory Visit: Payer: Self-pay | Admitting: Physician Assistant

## 2016-02-04 MED ORDER — LORAZEPAM 1 MG PO TABS: 1.0000 mg | ORAL_TABLET | Freq: Two times a day (BID) | ORAL | 2 refills | Status: DC | PRN

## 2016-02-04 NOTE — Telephone Encounter (Signed)
R/X phoned in.

## 2016-02-04 NOTE — Telephone Encounter (Signed)
Ok to refill 

## 2016-02-04 NOTE — Telephone Encounter (Signed)
Approved # 60 + 2 

## 2016-02-16 ENCOUNTER — Ambulatory Visit: Payer: Self-pay

## 2016-02-23 ENCOUNTER — Other Ambulatory Visit: Payer: Self-pay | Admitting: Physician Assistant

## 2016-02-23 NOTE — Telephone Encounter (Signed)
Rx filled per protocol  

## 2016-04-06 ENCOUNTER — Encounter: Payer: Self-pay | Admitting: Physician Assistant

## 2016-04-18 ENCOUNTER — Other Ambulatory Visit: Payer: Self-pay | Admitting: Physician Assistant

## 2016-04-21 ENCOUNTER — Ambulatory Visit: Payer: 59 | Admitting: Physician Assistant

## 2016-04-22 ENCOUNTER — Ambulatory Visit: Payer: 59 | Admitting: Physician Assistant

## 2016-04-29 ENCOUNTER — Encounter: Payer: Self-pay | Admitting: Physician Assistant

## 2016-04-29 ENCOUNTER — Ambulatory Visit (INDEPENDENT_AMBULATORY_CARE_PROVIDER_SITE_OTHER): Payer: 59 | Admitting: Physician Assistant

## 2016-04-29 VITALS — BP 120/90 | HR 76 | Temp 97.4°F | Resp 16 | Wt 233.6 lb

## 2016-04-29 DIAGNOSIS — Z794 Long term (current) use of insulin: Secondary | ICD-10-CM

## 2016-04-29 DIAGNOSIS — F329 Major depressive disorder, single episode, unspecified: Secondary | ICD-10-CM | POA: Diagnosis not present

## 2016-04-29 DIAGNOSIS — I10 Essential (primary) hypertension: Secondary | ICD-10-CM | POA: Diagnosis not present

## 2016-04-29 DIAGNOSIS — K219 Gastro-esophageal reflux disease without esophagitis: Secondary | ICD-10-CM | POA: Diagnosis not present

## 2016-04-29 DIAGNOSIS — E119 Type 2 diabetes mellitus without complications: Secondary | ICD-10-CM | POA: Insufficient documentation

## 2016-04-29 DIAGNOSIS — F419 Anxiety disorder, unspecified: Secondary | ICD-10-CM | POA: Diagnosis not present

## 2016-04-29 DIAGNOSIS — G4700 Insomnia, unspecified: Secondary | ICD-10-CM

## 2016-04-29 DIAGNOSIS — E785 Hyperlipidemia, unspecified: Secondary | ICD-10-CM

## 2016-04-29 DIAGNOSIS — IMO0001 Reserved for inherently not codable concepts without codable children: Secondary | ICD-10-CM

## 2016-04-29 DIAGNOSIS — E1165 Type 2 diabetes mellitus with hyperglycemia: Secondary | ICD-10-CM

## 2016-04-29 DIAGNOSIS — E559 Vitamin D deficiency, unspecified: Secondary | ICD-10-CM

## 2016-04-29 DIAGNOSIS — F32A Depression, unspecified: Secondary | ICD-10-CM

## 2016-04-29 LAB — HEMOGLOBIN A1C, FINGERSTICK: Hgb A1C (fingerstick): 8.6 % — ABNORMAL HIGH (ref <5.7)

## 2016-04-29 NOTE — Progress Notes (Signed)
[name] MRN: [id number], DOB: Feb 07, 1961, 56 y.o. Date of Encounter: _0 @  Chief Complaint:  Chief Complaint  Patient presents with  . Follow-up    3 mos    HPI: 56 y.o. year old white female  Presents for follow-up office visit.   She saw me on 09/19/13 as a new patient to establish care. She said that she had been going to [facility] on [facility] in [place]. Said that she had been going there for 30 years. Michela Pitcher that they actually dismissed her (but did not state the reason why).  Michela Pitcher that her last office visit and last labs there were about 3 months prior to her visit here on 09/19/2013. She said that she went there for office visits every 3 months.  However said that she was out of all of her medications except for her diabetes and blood pressure medicines.  Stated that she was seeing no other medical providers except for that family practice. Has seen no gynecologist. Has had no mammogram or colonoscopy or other preventive care.  At that initial visit, she was not fasting so could not check lipid panel. At that visit we just checked CMET and A1c. CMET revealed elevated LFTs. Also showed A1c elevated at 8.9. At that time I said to stop the Crestor and also to find out if she was taking any Tylenol or drinking any alcohol.  At f/u OV 11/05/13  she said that she does not remember getting a message to stop the Crestor. Said that she was still taking Crestor. Said that she does not use any Tylenol and does not drink any type of alcohol. At the time of that lab result also said to increase metformin from 500 twice a day to 1000 mg twice a day. She said that she did make this change. Also said to add Actos 45 mg daily. She says that she did add this medication as well. As it turns out, when we rechecked LFTs on 11/05/13-- they were normal. This was on Crestor and we verified that she was on Crestor at the time of these LFTs were normal.  Therefore  we told her to just go ahead and stay on the Crestor and we will monitor LFTs. Today she reports that she is still on the Crestor.  She says that when she checks her blood sugar she always checks it fasting morning. Says that she checks it about once a week.  She did not bring in her blood sugar log with her today.  She had follow-up office visit with me on 11/05/2013 for complete physical exam.  Today she reports that she is taking the Crestor and has been on this this entire time. She is taking other medications as directed as well. Also verified that she is taking aspirin which was added at prior visit with me.  At 06/11/13 OV she reports that she only occasionally checks BS. Did not bring log.  I reveiwed last labs I did here. 03/11/14 A1C was 7.4---said to add Januvia 74m--she says she did add this and IS taking this daily.   At office visit 09/23/14 I reviewed the fact that at her initial visit with me she had told me that in the past, she had been on Zoloft 200 mg but at the time of that initial visit with me she was off Zoloft completely but was needing to resume it. At that time we started Zoloft at 50 mg with plans  to gradually titrate up the dose. However, thus far the 50 mg dose has been continued. Discussed this with her today and she states that the 50 mg dose seems to be working well for her and she does not feel that she needs to increase the dose. Is controlling her symptoms well as causing no adverse effects.  At visit 09/23/14 she does not bring in any type of blood sugar log or blood sugar reading reports. States that she is taking medications as directed. She has been educated regarding proper diet and exercise but is noncompliant with this.  09/23/2014 lab results showed  --LDL had increased significantly. Past labs that showed LDL 64 and 73 the current LDL at that time was 192. At f/u OV 12/25/14-- she states that "sometimes forgets to take Crestor ". At [place] 12/25/14-- I discussed  the above lab results and the fact that cholesterol is severely elevated without medication but well controlled with medication and the necessity for her to take Crestor on a daily basis. She voices understanding and agrees. ---Also at lab vitamin D was very low and she was told to take prescription strength weekly for 12 weeks then take 4000 units daily. At [place] 12/25/14--States that she is taking this as directed and I reminded her about using the 4000 units daily when she completes the prescription. --Also at lab A1c was 8.0. For her to continue all current medications and also add Glucotrol XL 10 mg daily. 12/25/14-- she states that she is taking this as directed.  At [place] --12/25/2014--- she did bring in paperwork she has been writing sugar readings.  States that she only checks fasting morning readings. She has the following readings: She has 36 readings----none of these are in the 90s. 3 of these are in the 100s. 5 of these are in the 110s. 5 are these are in the 120s.  She has a few readings less than 90--- one reading that's an 24, one that is 69, 1 that is 82, 1 that is 88. She has 4 readings that are higher at 136, 167, 172, 165  At OV 04/02/15: She states that she has stopped Ambien. Says that her family told her that she was doing "crazy things "at night after taking that medicine. States that she does not remember doing and has no recollection of those events. Patient states that she is taking her Crestor now. However she is not fasting today. Says that she drank a "root beer" this morning and had forgotten about needing to be fasting. Also asked about what dose of vitamin D she is currently taking. Reviewed that at lab 09/23/14 she was to do the 50,000 units weekly for 12 weeks then 4000 units daily. She does not seem certain about exactly what she is taking but says that it is prescription. She did not bring in any blood sugars today for me to review. She states that she is taking all medicines  as directed and having no adverse effects other than what is stated about Ambien above.  10/16/2015: Reviewed that at last office visit-- 07/10/15-- A1c was checked and came back 8.8. At that time-- recommended to start Invokana 300 mg daily. Also told patient that if A1c not at goal at recheck with have to add insulin. Told her to be very strict with her carb intake and addition to taking all of the medications. Today she states that she has been taking the Invokana daily. Also reviewed that at the time of her  last lipid panel June 2016 LDL came back 192 and at that time she reported that she had not been taking her Crestor every day. Followed up that issue at other OVs since then. Followed it up again today and she states that she is taking the Crestor every day. She is taking all medications as directed. However did review that her blood pressure is high today. She says that she usually takes her medicines in the morning but because she did not eat much today she was going to wait and take medicines with her dinner tonight. Discussed need to take medications at same time every day. As well when we further discussed whether she was fasting-- she is not. So, cannot check FLP.  01/19/2016: She has no complaints or concerns today. She is taking blood pressure medications as directed. No lightheadedness. Initially she had no complaints but then once I mention her blood pressure being somewhat high again today, as it was at last visit, she says that she has not been getting much sleep and asked if that could be affecting her high blood pressure. Says that the Remeron has no effect. She takes it but doesn't notice any difference. Says that she is getting very little sleep. I asked if she is napping during the day but she says no she does not nap. Says that she checks her blood sugar a couple of times a week fasting morning and gets usually around 130 - 140 - 150. However says that this morning it was  230. She is taking diabetic medicines as directed with no adverse effects. She is fasting today. She is taking cholesterol medication as directed with no adverse effects. Current dose of Zoloft is still working in controlling her anxiety and depression. Causing no adverse effects.  04/29/2016: Says she checks her BS most mornings. Gets ~ 150. She is taking all diabetic meds as directed. No adv effects.  She is taking statin as directed. No myalgias or other adv effects.  She is taking Zoloft daily. This is controlling her anxiety and depression. Is causing no adv effects.   Past Medical History:  Diagnosis Date  . Allergy   . Anxiety 2013  . Asthma 2015  . Depression 2013  . Diabetes mellitus 2004  . GERD (gastroesophageal reflux disease) 2013  . Hyperlipidemia 1992  . Hypertension 1992  . Memory impairment      Home Meds: Outpatient Medications Prior to Visit  Medication Sig Dispense Refill  . aspirin 81 MG tablet Take 1 tablet (81 mg total) by mouth daily. 30 tablet 11  . blood glucose meter kit and supplies KIT Inject 1 each into the skin as directed. Dispense based on patient and insurance preference. Check blood sugar fasting each morning.  ICD E11.65 1 each 0  . Blood Glucose Monitoring Suppl (FREESTYLE LITE) DEVI UAD.E11.65  0  . canagliflozin (INVOKANA) 300 MG TABS tablet Take 300 mg by mouth daily before breakfast.    . [facility] D3 2000 units CAPS Take 2 capsules by mouth daily.  3  . glipiZIDE (GLUCOTROL XL) 10 MG 24 hr tablet TAKE 1 TABLET BY MOUTH DAILY WITH BREAKFAST. 90 tablet 1  . glucose blood test strip Dispense based on patient and insurance preference.  Check blood sugar fasting each morning.  ICD E11.65 50 each 12  . JANUVIA 100 MG tablet TAKE 1 TABLET (100 MG TOTAL) BY MOUTH DAILY. 30 tablet 5  . Lancets MISC Dispense based on patient and insurance preference.  Check blood sugar fasting each morning.  ICD E11.65 100 each 3  . lisinopril-hydrochlorothiazide  (ZESTORETIC) 20-12.5 MG tablet Take 1 tablet by mouth daily. 90 tablet 3  . LORazepam (ATIVAN) 1 MG tablet Take 1 tablet (1 mg total) by mouth 2 (two) times daily as needed. 60 tablet 2  . metFORMIN (GLUCOPHAGE) 1000 MG tablet Take 1 tablet (1,000 mg total) by mouth 2 (two) times daily with a meal. 180 tablet 3  . mirtazapine (REMERON) 30 MG tablet TAKE 1 TABLET (30 MG TOTAL) BY MOUTH AT BEDTIME. 90 tablet 0  . Nutritional Supplements (ESTROVEN PO) Take 1 tablet by mouth at bedtime. OTC    . omeprazole (PRILOSEC) 20 MG capsule TAKE ONE CAPSULE EVERY DAY 30 capsule 1  . pioglitazone (ACTOS) 45 MG tablet Take 1 tablet (45 mg total) by mouth daily. 90 tablet 0  . rosuvastatin (CRESTOR) 40 MG tablet TAKE 1 TABLET (40 MG TOTAL) BY MOUTH DAILY. 30 tablet 5  . sertraline (ZOLOFT) 50 MG tablet TAKE 1 TABLET EVERY DAY 90 tablet 0  . Suvorexant (BELSOMRA) 15 MG TABS Take 1 tablet by mouth at bedtime as needed. 30 tablet 2  . predniSONE (DELTASONE) 20 MG tablet Take 3 daily for 2 days, then 2 daily for 2 days, then 1 daily for 2 days. (Patient not taking: Reported on 04/29/2016) 12 tablet 0   No facility-administered medications prior to visit.     Allergies:  Allergies  Allergen Reactions  . Contrast Media [Iodinated Diagnostic Agents] Anaphylaxis  . Ambien [Zolpidem Tartrate] Other (See Comments)    Family has told her that after taking Ambien, she does "crazy things" that she does not remember doing.   . Codeine Nausea And Vomiting    Social History   Social History  . Marital status: Married    Spouse name: N/A  . Number of children: N/A  . Years of education: N/A   Occupational History  . Not on file.   Social History Main Topics  . Smoking status: Never Smoker  . Smokeless tobacco: Never Used  . Alcohol use No  . Drug use: No  . Sexual activity: Yes   Other Topics Concern  . Not on file   Social History Narrative   Married.    Lives with husband and youngest son, who is 42  y/o.   Has 4 children.   Does not work outside of home.    No formal exercise. Only activity around the house.   Tries to be careful with Diabetic diet.    Family History  Problem Relation Age of Onset  . Cancer Father     Lymphoma, Leukemia  . Depression Father   . Cancer Maternal Grandfather   . Cancer Paternal Grandmother   . Diabetes Paternal Grandfather   . Kidney disease Paternal Grandfather      Review of Systems:  See HPI for pertinent ROS. All other ROS negative.    Physical Exam: Blood pressure 120/90, pulse 76, temperature 97.4 F (36.3 C), temperature source Oral, resp. rate 16, weight 233 lb 9.6 oz (106 kg), SpO2 98 %., Body mass index is 44.14 kg/m. General: Obese WF. Appears in no acute distress. Neck: Supple. No thyromegaly. No lymphadenopathy. No carotid bruit.  Lungs: Clear bilaterally to auscultation without wheezes, rales, or rhonchi. Breathing is unlabored. Heart: RRR with S1 S2. No murmurs, rubs, or gallops. Abdomen: Soft, non-tender, non-distended with normoactive bowel sounds. No hepatomegaly. No rebound/guarding. No obvious abdominal masses. Musculoskeletal:  Strength and tone normal for age. Extremities/Skin: Warm and dry.  Neuro: Alert and oriented X 3. Moves all extremities spontaneously. Gait is normal. CNII-XII grossly in tact. Psych:  Responds to questions appropriately with a normal affect. Diabetic Foot Exam: Inspection is normal.  Sensation is normal and intact.  1+ bilateral posterior tibial pulses and dorsalis pedis pulses.      ASSESSMENT AND PLAN:  56 y.o. year old female with    1. Type 2 diabetes mellitus with hyperglycemia  On ACE inhibitor On statin On ASA 81 mg daily  Micro albumin was done 11/05/2013, 12/25/2014, 01/19/2016  She reports that she does have annual diabetic eye exam-had this April or May 2015.  --At OV 09/23/14 reminded her that this is due and told her to go home and call to schedule this and wrote this on her  AVS --- At OV 12/25/14 she reports that she still has not had eye exam. Again today, I wrote this on her AVS as a reminder. ---At OV 04/02/15--- states she has eye exam scheduled for February. Diabetic Foot Exam entered into [employer] 09/19/2013. Again on 09/23/14.  04/29/2016: Check A1C. Told her to be extra careful wht diet. On max dose of all oral diabetic meds. Only remaining treatment options are injections.   2. Hyperlipidemia She is taking Crestor. She has had elevated LFTs on one of the checks in the past but then they were normal at the most recent LFT check. LFTs norma lagain at check 03/2014 and 09/2014 03/2014 LDL at goal--at 73 09/2014 LDL was up significantly at 192. Discussed at follow-up visit 12/25/14 and patient states that she doesn't remember to take the Crestor daily. Discussed the variation in these lab results and the importance of taking the Crestor daily. She is agreeable to do so. At visit 04/02/15 she states that she is taking the Crestor daily. However she is not fasting today so we'll wait to repeat FLP. At visit 01/2016 she is fasting so FLP rechecked.  04/29/2016: FLP/LFTs performed 01/2016. Told her to make sure to stay on statin. Reviewed LDL reduction she has gottne with use of statin and importance of compliance with this. Cont current dose, current statin.    4. Essential hypertension BP was slightly elevated at visit 10/2015 and is slightly elevated again 01/2016. At that visit lisinopril was increased from 10/12.5 up to 20/12.5. Last Potassium was good at 3.9. 04/29/2016: BP at goal. Cont current meds. BMET normal 01/2016.   5. Vitamin D deficiency On labs 11/05/13 vitamin D was low at 23. Currently she is on vitamin D. I told her to start taking vitamin D over-the-counter 2000 units daily. Recheck Vitamin D at lab 09/23/2014--I am indeed very low. She was told to take prescription 50,000 units weekly for 12 weeks and when she completes this she is to take  over-the-counter vitamin D 4000 units daily. At OV 12/25/14 she states that she is taking vitamin D as directed and reminded her to take the over-the-counter supplements when she completes the prescription strength. She voices understanding and agrees. At OV 04/02/15 she does not seem certain about what dose of vitamin D she is taking currently. We'll recheck vitamin D level now. 04/29/2016: Cont current dose Vit D  5. Asthma, mild intermittent, uncomplicated 04/29/2016: This is stable and controlled.  6. Gastroesophageal reflux disease, esophagitis presence not specified 04/29/2016: This is stable and controlled.  7. Anxiety She was on Zoloft 200 mg in past, prior to coming to  our office.  At [place] 09/19/2013 discussed: Need to start this back at low dose at 50 mg for couple weeks. Then can increase to 100 mg and then slowly will titrate back up as needed.  At [place] 09/23/2014---- reviewed with patient that she has continued on the above medications since then. She states that current medications are controlling her symptoms well. She is not feeling anxious or depressed with using Zoloft at 50 mg. Wants to continue current dose and current treatment.  04/29/2016: This remains stable and controlled on current medications.   8. Depression See # 7 above. 04/29/2016 This remains stable and controlled on current medications.  9. Insomnia At visit 04/02/15 she reports that Ambien has been causing her to do crazy things during the night that she does not recall doing. Therefore she has stopped the Ambien. At visit 04/02/15 prescribed Remeron 30 mg QHS. Told her to follow-up if this causes adverse effects or is not effective. At visit 01/2016 she states that the Remeron is ineffective. At visit changed her to [place].  10. GERD (gastroesophageal reflux disease) 04/29/2016: Symptoms are controlled with omeprazole daily.  11. Asthma 04/29/2016 This is stable/controlled with current medication.  She had  complete physical exam with me on 11/05/2013. Visit for preventive health examination She had breast exam and pelvic exam by me on 11/21/13. THE FOLLOWING IS COPIED FROM THOSE VISITS:  A. Screening Labs:  She is fasting today.  - CBC with Differential - COMPLETE METABOLIC PANEL WITH GFR - Lipid panel - TSH - Vit D  25 hydroxy (rtn osteoporosis monitoring)  B. Screening Colonoscopy: She reports that she has never had a screening colonoscopy. I discussed risks versus benefits of having screening colonoscopy. She is agreeable for me to go ahead and schedule referral to GI for her to schedule this. - Ambulatory referral to Gastroenterology At office visit 03/11/2014 patient says that she never heard anything about the GI appointment. At office visit 03/11/2014 I ordered another GI referral. He says that she has gotten a new phone and does have voice mail so I wonder if this has been a problem in the past. At [place] 06/12/14--Says this was scheduled but had family emergency so cancelled. Has not yet re-scheduled but says she will. At [place] 09/23/2014---Discussed this. Pt states that she does not want to follow-up with colonoscopy. Aware of risks versus benefits but defers. Therefore at today's visit discussed option of doing Hemoccults 3 and she is agreeable. HemeOccult x 3 ordered at OV 09/23/2014    C. Pelvic Exam, Pap Smear, Breast Exam, Mammogram She returned on 11/21/2013 and we did breast exam and pelvic exam. Patient says that she did follow-up for the mammogram that was ordered that day. Last mammogram was 11/29/2013------ negative At visit 12/25/2014 I had written on her AVS to remind her to schedule a mammogram. At visit 01/2016 she states that she never had the mammogram. That visit she told me I could go ahead and place the order for this. I did place order for mammogram at this visit.  D. Immunizations: Influenza Vaccine:   12/25/2014 Tetanus:  Tdap given here 11/05/2013.  Pneumonia Vaccine:    Pneumovax 23--given here 11/05/2013 ---------------------------No further Pneumonia Vaccine indicated until Age 57 Zostavax; Discuss at age 62   Routine follow-up office visit here in 3 months or follow-up sooner if needed.   Marin Olp Girard, [place], [facility] 04/29/2016 2:24 PM

## 2016-04-30 ENCOUNTER — Other Ambulatory Visit: Payer: Self-pay | Admitting: Physician Assistant

## 2016-04-30 LAB — MICROALBUMIN, URINE: Microalb, Ur: 0.9 mg/dL

## 2016-04-30 NOTE — Telephone Encounter (Signed)
Rx refilled per protocol 

## 2016-05-06 ENCOUNTER — Other Ambulatory Visit: Payer: Self-pay

## 2016-05-06 MED ORDER — DULAGLUTIDE 0.75 MG/0.5ML ~~LOC~~ SOAJ: 0.7500 mg | SUBCUTANEOUS | 5 refills | Status: DC

## 2016-05-11 ENCOUNTER — Other Ambulatory Visit: Payer: Self-pay | Admitting: Physician Assistant

## 2016-05-12 NOTE — Telephone Encounter (Signed)
Rx phoned into pharmacy.

## 2016-05-12 NOTE — Telephone Encounter (Signed)
Approved # 60 + 2 

## 2016-05-12 NOTE — Telephone Encounter (Signed)
Last OV 1-25 Last refill 02-04-2016 Okay to refill?

## 2016-05-21 ENCOUNTER — Other Ambulatory Visit: Payer: Self-pay | Admitting: Physician Assistant

## 2016-05-31 ENCOUNTER — Other Ambulatory Visit: Payer: Self-pay | Admitting: Physician Assistant

## 2016-05-31 NOTE — Telephone Encounter (Signed)
Rx filled per protocol  

## 2016-06-08 ENCOUNTER — Other Ambulatory Visit: Payer: Self-pay | Admitting: Physician Assistant

## 2016-07-13 ENCOUNTER — Other Ambulatory Visit: Payer: Self-pay | Admitting: Physician Assistant

## 2016-07-13 NOTE — Telephone Encounter (Signed)
Medication Refill appropriate

## 2016-07-28 ENCOUNTER — Encounter: Payer: Self-pay | Admitting: Physician Assistant

## 2016-07-28 ENCOUNTER — Ambulatory Visit (INDEPENDENT_AMBULATORY_CARE_PROVIDER_SITE_OTHER): Payer: 59 | Admitting: Physician Assistant

## 2016-07-28 VITALS — BP 124/86 | HR 73 | Temp 97.4°F | Resp 18 | Wt 241.6 lb

## 2016-07-28 DIAGNOSIS — I10 Essential (primary) hypertension: Secondary | ICD-10-CM

## 2016-07-28 DIAGNOSIS — E559 Vitamin D deficiency, unspecified: Secondary | ICD-10-CM | POA: Diagnosis not present

## 2016-07-28 DIAGNOSIS — E1165 Type 2 diabetes mellitus with hyperglycemia: Secondary | ICD-10-CM

## 2016-07-28 DIAGNOSIS — F419 Anxiety disorder, unspecified: Secondary | ICD-10-CM | POA: Diagnosis not present

## 2016-07-28 DIAGNOSIS — E785 Hyperlipidemia, unspecified: Secondary | ICD-10-CM | POA: Diagnosis not present

## 2016-07-28 DIAGNOSIS — IMO0001 Reserved for inherently not codable concepts without codable children: Secondary | ICD-10-CM

## 2016-07-28 LAB — COMPLETE METABOLIC PANEL WITH GFR
AG Ratio: 1.4 Ratio (ref 1.0–2.5)
ALBUMIN: 4.2 g/dL (ref 3.6–5.1)
ALT: 32 U/L — AB (ref 6–29)
AST: 25 U/L (ref 10–35)
Alkaline Phosphatase: 64 U/L (ref 33–130)
BUN/Creatinine Ratio: 16.5 Ratio (ref 6–22)
BUN: 13 mg/dL (ref 7–25)
CALCIUM: 9.8 mg/dL (ref 8.6–10.4)
CHLORIDE: 103 mmol/L (ref 98–110)
CO2: 22 mmol/L (ref 20–31)
CREATININE: 0.79 mg/dL (ref 0.50–1.05)
GFR, Est African American: >89 mL/min (ref >=60)
GFR, Est Non African American: 84 mL/min (ref >=60)
GLUCOSE: 176 mg/dL — AB (ref 70–99)
Globulin: 3 g/dL (ref 1.9–3.7)
Potassium: 4.6 mmol/L (ref 3.5–5.3)
Sodium: 139 mmol/L (ref 135–146)
TOTAL PROTEIN: 7.2 g/dL (ref 6.1–8.1)
Total Bilirubin: 0.4 mg/dL (ref 0.2–1.2)

## 2016-07-28 LAB — LIPID PANEL
Cholesterol: 222 mg/dL — ABNORMAL HIGH (ref <200)
HDL: 59 mg/dL (ref >50)
TRIGLYCERIDES: 402 mg/dL — AB (ref <150)
Total CHOL/HDL Ratio: 3.8 Ratio (ref <5.0)

## 2016-07-28 NOTE — Progress Notes (Signed)
Patient ID: JAE BRUCK MRN: [id number], DOB: 05/31/1960, 56 y.o. Date of Encounter: _0 @  Chief Complaint:  Chief Complaint  Patient presents with  . Follow-up    3 mos    HPI: 56 y.o. year old white female  Presents for follow-up office visit.   She saw me on 09/19/13 as a new patient to establish care. She said that she had been going to [facility] on [facility] in [place]. Said that she had been going there for 30 years. Michela Pitcher that they actually dismissed her (but did not state the reason why).  Michela Pitcher that her last office visit and last labs there were about 3 months prior to her visit here on 09/19/2013. She said that she went there for office visits every 3 months.  However said that she was out of all of her medications except for her diabetes and blood pressure medicines.  Stated that she was seeing no other medical providers except for that family practice. Has seen no gynecologist. Has had no mammogram or colonoscopy or other preventive care.  At that initial visit, she was not fasting so could not check lipid panel. At that visit we just checked CMET and A1c. CMET revealed elevated LFTs. Also showed A1c elevated at 8.9. At that time I said to stop the Crestor and also to find out if she was taking any Tylenol or drinking any alcohol.  At f/u OV 11/05/13  she said that she does not remember getting a message to stop the Crestor. Said that she was still taking Crestor. Said that she does not use any Tylenol and does not drink any type of alcohol. At the time of that lab result also said to increase metformin from 500 twice a day to 1000 mg twice a day. She said that she did make this change. Also said to add Actos 45 mg daily. She says that she did add this medication as well. As it turns out, when we rechecked LFTs on 11/05/13-- they were normal. This was on Crestor and we verified that she was on Crestor at the time of these LFTs were normal.  Therefore  we told her to just go ahead and stay on the Crestor and we will monitor LFTs. Today she reports that she is still on the Crestor.  She says that when she checks her blood sugar she always checks it fasting morning. Says that she checks it about once a week.  She did not bring in her blood sugar log with her today.  She had follow-up office visit with me on 11/05/2013 for complete physical exam.  Today she reports that she is taking the Crestor and has been on this this entire time. She is taking other medications as directed as well. Also verified that she is taking aspirin which was added at prior visit with me.  At 56/9/15 OV she reports that she only occasionally checks BS. Did not bring log.  I reveiwed last labs I did here. 03/11/14 A1C was 7.4---said to add Januvia 63m--she says she did add this and IS taking this daily.   At office visit 09/23/14 I reviewed the fact that at her initial visit with me she had told me that in the past, she had been on Zoloft 200 mg but at the time of that initial visit with me she was off Zoloft completely but was needing to resume it. At that time we started Zoloft at 50 mg with plans  to gradually titrate up the dose. However, thus far the 50 mg dose has been continued. Discussed this with her today and she states that the 50 mg dose seems to be working well for her and she does not feel that she needs to increase the dose. Is controlling her symptoms well as causing no adverse effects.  At visit 09/23/14 she does not bring in any type of blood sugar log or blood sugar reading reports. States that she is taking medications as directed. She has been educated regarding proper diet and exercise but is noncompliant with this.  09/23/2014 lab results showed  --LDL had increased significantly. Past labs that showed LDL 64 and 73 the current LDL at that time was 192. At f/u OV 12/25/14-- she states that "sometimes forgets to take Crestor ". At [place] 12/25/14-- I discussed  the above lab results and the fact that cholesterol is severely elevated without medication but well controlled with medication and the necessity for her to take Crestor on a daily basis. She voices understanding and agrees. ---Also at lab vitamin D was very low and she was told to take prescription strength weekly for 12 weeks then take 4000 units daily. At [place] 12/25/14--States that she is taking this as directed and I reminded her about using the 4000 units daily when she completes the prescription. --Also at lab A1c was 8.0. For her to continue all current medications and also add Glucotrol XL 10 mg daily. 12/25/14-- she states that she is taking this as directed.  At [place] --12/25/2014--- she did bring in paperwork she has been writing sugar readings.  States that she only checks fasting morning readings. She has the following readings: She has 36 readings----none of these are in the 90s. 3 of these are in the 100s. 5 of these are in the 110s. 5 are these are in the 120s.  She has a few readings less than 90--- one reading that's an 24, one that is 69, 1 that is 82, 1 that is 88. She has 4 readings that are higher at 136, 167, 172, 165  At OV 04/02/15: She states that she has stopped Ambien. Says that her family told her that she was doing "crazy things "at night after taking that medicine. States that she does not remember doing and has no recollection of those events. Patient states that she is taking her Crestor now. However she is not fasting today. Says that she drank a "root beer" this morning and had forgotten about needing to be fasting. Also asked about what dose of vitamin D she is currently taking. Reviewed that at lab 09/23/14 she was to do the 50,000 units weekly for 12 weeks then 4000 units daily. She does not seem certain about exactly what she is taking but says that it is prescription. She did not bring in any blood sugars today for me to review. She states that she is taking all medicines  as directed and having no adverse effects other than what is stated about Ambien above.  10/16/2015: Reviewed that at last office visit-- 07/10/15-- A1c was checked and came back 8.8. At that time-- recommended to start Invokana 300 mg daily. Also told patient that if A1c not at goal at recheck with have to add insulin. Told her to be very strict with her carb intake and addition to taking all of the medications. Today she states that she has been taking the Invokana daily. Also reviewed that at the time of her  last lipid panel June 2016 LDL came back 192 and at that time she reported that she had not been taking her Crestor every day. Followed up that issue at other OVs since then. Followed it up again today and she states that she is taking the Crestor every day. She is taking all medications as directed. However did review that her blood pressure is high today. She says that she usually takes her medicines in the morning but because she did not eat much today she was going to wait and take medicines with her dinner tonight. Discussed need to take medications at same time every day. As well when we further discussed whether she was fasting-- she is not. So, cannot check FLP.  01/19/2016: She has no complaints or concerns today. She is taking blood pressure medications as directed. No lightheadedness. Initially she had no complaints but then once I mention her blood pressure being somewhat high again today, as it was at last visit, she says that she has not been getting much sleep and asked if that could be affecting her high blood pressure. Says that the Remeron has no effect. She takes it but doesn't notice any difference. Says that she is getting very little sleep. I asked if she is napping during the day but she says no she does not nap. Says that she checks her blood sugar a couple of times a week fasting morning and gets usually around 130 - 140 - 150. However says that this morning it was  230. She is taking diabetic medicines as directed with no adverse effects. She is fasting today. She is taking cholesterol medication as directed with no adverse effects. Current dose of Zoloft is still working in controlling her anxiety and depression. Causing no adverse effects.  04/29/2016: Says she checks her BS most mornings. Gets ~ 150. She is taking all diabetic meds as directed. No adv effects.  She is taking statin as directed. No myalgias or other adv effects.  She is taking Zoloft daily. This is controlling her anxiety and depression. Is causing no adv effects.   07/28/2016: Reviewed that at lab 04/29/16-- A1c came back at 8.6 and had her add Trulicity. Today she reports that she did add the Trulicity and gives this every Monday. Says that she is having no adverse effects.  Says that she checks fasting blood sugar most mornings and gets around 160. She has no concerns to address today. She is taking all diabetic meds as directed. No adv effects.  She is taking statin as directed. No myalgias or other adv effects.  She is taking Zoloft daily. This is controlling her anxiety and depression. Is causing no adv effects.    Past Medical History:  Diagnosis Date  . Allergy   . Anxiety 2013  . Asthma 2015  . Depression 2013  . Diabetes mellitus 2004  . GERD (gastroesophageal reflux disease) 2013  . Hyperlipidemia 1992  . Hypertension 1992  . Memory impairment      Home Meds: Outpatient Medications Prior to Visit  Medication Sig Dispense Refill  . aspirin 81 MG tablet Take 1 tablet (81 mg total) by mouth daily. 30 tablet 11  . blood glucose meter kit and supplies KIT Inject 1 each into the skin as directed. Dispense based on patient and insurance preference. Check blood sugar fasting each morning.  ICD E11.65 1 each 0  . Blood Glucose Monitoring Suppl (FREESTYLE LITE) DEVI UAD.E11.65  0  . canagliflozin (INVOKANA) 300 MG  TABS tablet Take 300 mg by mouth daily before breakfast.     . [facility] D3 2000 units CAPS Take 2 capsules by mouth daily.  3  . Dulaglutide (TRULICITY) 8.31 DV/7.6HY SOPN Inject 0.75 mg into the skin once a week. Subcutaneous 4 pen 5  . glipiZIDE (GLUCOTROL XL) 10 MG 24 hr tablet TAKE 1 TABLET BY MOUTH DAILY WITH BREAKFAST. 90 tablet 1  . glucose blood test strip Dispense based on patient and insurance preference.  Check blood sugar fasting each morning.  ICD E11.65 50 each 12  . INVOKANA 100 MG TABS tablet Take 100 mg by mouth daily.  5  . JANUVIA 100 MG tablet TAKE 1 TABLET (100 MG TOTAL) BY MOUTH DAILY. 30 tablet 1  . Lancets MISC Dispense based on patient and insurance preference.  Check blood sugar fasting each morning.  ICD E11.65 100 each 3  . lisinopril-hydrochlorothiazide (ZESTORETIC) 20-12.5 MG tablet Take 1 tablet by mouth daily. 90 tablet 3  . LORazepam (ATIVAN) 1 MG tablet TAKE 1 TABLET BY MOUTH TWICE A DAY 60 tablet 2  . metFORMIN (GLUCOPHAGE) 1000 MG tablet Take 1 tablet (1,000 mg total) by mouth 2 (two) times daily with a meal. 180 tablet 3  . mirtazapine (REMERON) 30 MG tablet TAKE 1 TABLET (30 MG TOTAL) BY MOUTH AT BEDTIME. 30 tablet 1  . Nutritional Supplements (ESTROVEN PO) Take 1 tablet by mouth at bedtime. OTC    . omeprazole (PRILOSEC) 20 MG capsule TAKE ONE CAPSULE EVERY DAY 30 capsule 1  . pioglitazone (ACTOS) 45 MG tablet Take 1 tablet (45 mg total) by mouth daily. 90 tablet 0  . rosuvastatin (CRESTOR) 40 MG tablet TAKE 1 TABLET (40 MG TOTAL) BY MOUTH DAILY. 30 tablet 4  . sertraline (ZOLOFT) 50 MG tablet TAKE 1 TABLET EVERY DAY 90 tablet 0  . Suvorexant (BELSOMRA) 15 MG TABS Take 1 tablet by mouth at bedtime as needed. 30 tablet 2  . predniSONE (DELTASONE) 20 MG tablet Take 3 daily for 2 days, then 2 daily for 2 days, then 1 daily for 2 days. (Patient not taking: Reported on 04/29/2016) 12 tablet 0   No facility-administered medications prior to visit.     Allergies:  Allergies  Allergen Reactions  . Contrast Media [Iodinated  Diagnostic Agents] Anaphylaxis  . Ambien [Zolpidem Tartrate] Other (See Comments)    Family has told her that after taking Ambien, she does "crazy things" that she does not remember doing.   . Codeine Nausea And Vomiting    Social History   Social History  . Marital status: Married    Spouse name: N/A  . Number of children: N/A  . Years of education: N/A   Occupational History  . Not on file.   Social History Main Topics  . Smoking status: Never Smoker  . Smokeless tobacco: Never Used  . Alcohol use No  . Drug use: No  . Sexual activity: Yes   Other Topics Concern  . Not on file   Social History Narrative   Married.    Lives with husband and youngest son, who is 22 y/o.   Has 4 children.   Does not work outside of home.    No formal exercise. Only activity around the house.   Tries to be careful with Diabetic diet.    Family History  Problem Relation Age of Onset  . Cancer Father     Lymphoma, Leukemia  . Depression Father   . Cancer Maternal Grandfather   .  Cancer Paternal Grandmother   . Diabetes Paternal Grandfather   . Kidney disease Paternal Grandfather      Review of Systems:  See HPI for pertinent ROS. All other ROS negative.    Physical Exam: Blood pressure 124/86, pulse 73, temperature 97.4 F (36.3 C), temperature source Oral, resp. rate 18, weight 241 lb 9.6 oz (109.6 kg), SpO2 98 %., Body mass index is 45.65 kg/m. General: Obese WF. Appears in no acute distress. Neck: Supple. No thyromegaly. No lymphadenopathy. No carotid bruit.  Lungs: Clear bilaterally to auscultation without wheezes, rales, or rhonchi. Breathing is unlabored. Heart: RRR with S1 S2. No murmurs, rubs, or gallops. Abdomen: Soft, non-tender, non-distended with normoactive bowel sounds. No hepatomegaly. No rebound/guarding. No obvious abdominal masses. Musculoskeletal:  Strength and tone normal for age. Extremities/Skin: Warm and dry.  Neuro: Alert and oriented X 3. Moves all  extremities spontaneously. Gait is normal. CNII-XII grossly in tact. Psych:  Responds to questions appropriately with a normal affect. Diabetic Foot Exam: Inspection is normal.  Sensation is normal and intact.  1+ bilateral posterior tibial pulses and dorsalis pedis pulses.      ASSESSMENT AND PLAN:  56 y.o. year old female with    1. Type 2 diabetes mellitus with hyperglycemia  On ACE inhibitor On statin On ASA 81 mg daily  Micro albumin was done 11/05/2013, 12/25/2014, 04/29/2016  She reports that she does have annual diabetic eye exam -Diabetic Foot Exam is good  07/28/2016:  Check A1C, CMET---She is now on every class of meds except Inulin  2. Hyperlipidemia She is taking Crestor. She has had elevated LFTs on one of the checks in the past but then they were normal at the most recent LFT check. LFTs norma lagain at check 03/2014 and 09/2014 03/2014 LDL at goal--at 73 09/2014 LDL was up significantly at 192. Discussed at follow-up visit 12/25/14 and patient states that she doesn't remember to take the Crestor daily. Discussed the variation in these lab results and the importance of taking the Crestor daily. She is agreeable to do so. At visit 04/02/15 she states that she is taking the Crestor daily. However she is not fasting today so we'll wait to repeat FLP. At visit 01/2016 she is fasting so FLP rechecked.  04/29/2016: FLP/LFTs performed 01/2016. Told her to make sure to stay on statin. Reviewed LDL reduction she has gottne with use of statin and importance of compliance with this. Cont current dose, current statin.  07/28/2016: She is fasting. She is on Crestor. Recheck FLP/LFT.  4. Essential hypertension BP was slightly elevated at visit 10/2015 and is slightly elevated again 01/2016. At that visit lisinopril was increased from 10/12.5 up to 20/12.5. Last Potassium was good at 3.9. 04/29/2016: BP at goal. Cont current meds. BMET normal 01/2016.  07/28/2016: Blood Pressure at goal.  Continue current medications. Check lab to monitor.  5. Vitamin D deficiency On labs 11/05/13 vitamin D was low at 23. Currently she is on vitamin D. I told her to start taking vitamin D over-the-counter 2000 units daily. Recheck Vitamin D at lab 09/23/2014--I am indeed very low. She was told to take prescription 50,000 units weekly for 12 weeks and when she completes this she is to take over-the-counter vitamin D 4000 units daily. At [place] 12/25/14 she states that she is taking vitamin D as directed and reminded her to take the over-the-counter supplements when she completes the prescription strength. She voices understanding and agrees. At [place] 04/02/15 she does not seem  certain about what dose of vitamin D she is taking currently. We'll recheck vitamin D level now. 04/29/2016: Cont current dose Vit D 07/28/2016: Last vitamin D level was checked remotely ( > 1 year ago). Will recheck vitamin D level today.  5. Asthma, mild intermittent, uncomplicated 1/74/9449: : This is stable and controlled.  6. Gastroesophageal reflux disease, esophagitis presence not specified 07/28/2016:  This is stable and controlled.  7. Anxiety She was on Zoloft 200 mg in past, prior to coming to our office.  At [place] 09/19/2013 discussed: Need to start this back at low dose at 50 mg for couple weeks. Then can increase to 100 mg and then slowly will titrate back up as needed.  At [place] 09/23/2014---- reviewed with patient that she has continued on the above medications since then. She states that current medications are controlling her symptoms well. She is not feeling anxious or depressed with using Zoloft at 50 mg. Wants to continue current dose and current treatment.  07/28/2016:  This remains stable and controlled on current medications.   8. Depression See # 7 above. 07/28/2016:  This remains stable and controlled on current medications.  9. Insomnia At visit 04/02/15 she reports that Ambien has been causing her to do crazy  things during the night that she does not recall doing. Therefore she has stopped the Ambien. At visit 04/02/15 prescribed Remeron 30 mg QHS. Told her to follow-up if this causes adverse effects or is not effective. At visit 01/2016 she states that the Remeron is ineffective. At visit changed her to [place].  10. GERD (gastroesophageal reflux disease) 07/28/2016: Symptoms are controlled with omeprazole daily.  11. Asthma 07/28/2016: This is stable/controlled with current medication.   She had CPE with me 2015 At ROVs since then, I have tried to update preventive care--such as at one OV I ordered mammogram--she was agreeable--but then she did not go for this---will discuss scheduling another CPE at next OV  Routine follow-up office visit here in 3 months or follow-up sooner if needed.   Signed, [address] [place], [place], [facility] 07/28/2016 9:20 AM

## 2016-07-29 ENCOUNTER — Other Ambulatory Visit: Payer: Self-pay

## 2016-07-29 LAB — HEMOGLOBIN A1C
Hgb A1c MFr Bld: 6.8 % — ABNORMAL HIGH (ref <5.7)
MEAN PLASMA GLUCOSE: 148 mg/dL

## 2016-07-29 LAB — VITAMIN D 25 HYDROXY (VIT D DEFICIENCY, FRACTURES): VIT D 25 HYDROXY: 17 ng/mL — AB (ref 30–100)

## 2016-07-29 MED ORDER — CVS D3 50 MCG (2000 UT) PO CAPS: 2.0000 | ORAL_CAPSULE | Freq: Every day | ORAL | 3 refills | Status: DC

## 2016-08-02 ENCOUNTER — Encounter: Payer: Self-pay | Admitting: Physician Assistant

## 2016-08-08 ENCOUNTER — Other Ambulatory Visit: Payer: Self-pay | Admitting: Physician Assistant

## 2016-08-09 ENCOUNTER — Other Ambulatory Visit: Payer: Self-pay | Admitting: Physician Assistant

## 2016-08-09 NOTE — Telephone Encounter (Signed)
Refill appropriate 

## 2016-08-10 NOTE — Telephone Encounter (Signed)
Refill appropriate 

## 2016-08-16 ENCOUNTER — Other Ambulatory Visit: Payer: Self-pay | Admitting: Physician Assistant

## 2016-08-16 NOTE — Telephone Encounter (Signed)
Approved # 60 + 2 

## 2016-08-16 NOTE — Telephone Encounter (Signed)
Ok to refill 

## 2016-08-17 NOTE — Telephone Encounter (Signed)
Rx phoned into pharmacy.

## 2016-09-06 ENCOUNTER — Encounter: Payer: Self-pay | Admitting: Physician Assistant

## 2016-09-08 ENCOUNTER — Other Ambulatory Visit: Payer: Self-pay | Admitting: Physician Assistant

## 2016-09-08 NOTE — Telephone Encounter (Signed)
Refill appropriate 

## 2016-10-12 ENCOUNTER — Encounter: Payer: Self-pay | Admitting: Physician Assistant

## 2016-10-15 ENCOUNTER — Other Ambulatory Visit: Payer: Self-pay

## 2016-10-15 MED ORDER — MIRTAZAPINE 30 MG PO TABS
ORAL_TABLET | ORAL | 0 refills | Status: DC
Start: 2016-10-15 — End: 2016-10-26

## 2016-10-15 NOTE — Telephone Encounter (Signed)
Refill appropriate 

## 2016-10-19 ENCOUNTER — Other Ambulatory Visit: Payer: Self-pay | Admitting: Physician Assistant

## 2016-10-19 NOTE — Telephone Encounter (Signed)
Rx filled per protocol  

## 2016-10-26 ENCOUNTER — Other Ambulatory Visit: Payer: Self-pay | Admitting: *Deleted

## 2016-10-26 MED ORDER — MIRTAZAPINE 30 MG PO TABS: ORAL_TABLET | ORAL | 0 refills | Status: DC

## 2016-10-26 NOTE — Telephone Encounter (Signed)
Received fax requesting refill on  Remeron.   Medication filled x1 with no refills.   Requires office visit before any further refills can be given.

## 2016-11-04 ENCOUNTER — Other Ambulatory Visit: Payer: Self-pay | Admitting: Physician Assistant

## 2016-11-04 ENCOUNTER — Encounter: Payer: Self-pay | Admitting: Physician Assistant

## 2016-11-04 ENCOUNTER — Ambulatory Visit (INDEPENDENT_AMBULATORY_CARE_PROVIDER_SITE_OTHER): Payer: 59 | Admitting: Physician Assistant

## 2016-11-04 VITALS — BP 110/80 | HR 79 | Temp 97.4°F | Resp 16 | Wt 239.6 lb

## 2016-11-04 DIAGNOSIS — Z794 Long term (current) use of insulin: Secondary | ICD-10-CM | POA: Diagnosis not present

## 2016-11-04 DIAGNOSIS — I10 Essential (primary) hypertension: Secondary | ICD-10-CM

## 2016-11-04 DIAGNOSIS — F419 Anxiety disorder, unspecified: Secondary | ICD-10-CM | POA: Diagnosis not present

## 2016-11-04 DIAGNOSIS — E785 Hyperlipidemia, unspecified: Secondary | ICD-10-CM

## 2016-11-04 DIAGNOSIS — E559 Vitamin D deficiency, unspecified: Secondary | ICD-10-CM | POA: Diagnosis not present

## 2016-11-04 DIAGNOSIS — E1165 Type 2 diabetes mellitus with hyperglycemia: Secondary | ICD-10-CM

## 2016-11-04 DIAGNOSIS — K219 Gastro-esophageal reflux disease without esophagitis: Secondary | ICD-10-CM

## 2016-11-04 DIAGNOSIS — IMO0001 Reserved for inherently not codable concepts without codable children: Secondary | ICD-10-CM

## 2016-11-04 LAB — HEMOGLOBIN A1C, FINGERSTICK: HEMOGLOBIN A1C, FINGERSTICK: 7.8 % — AB (ref <5.7)

## 2016-11-04 NOTE — Progress Notes (Signed)
Patient ID: JAE BRUCK MRN: [id number], DOB: 05/31/1960, 56 y.o. Date of Encounter: _0 @  Chief Complaint:  Chief Complaint  Patient presents with  . Follow-up    3 mos    HPI: 56 y.o. year old white female  Presents for follow-up office visit.   She saw me on 09/19/13 as a new patient to establish care. She said that she had been going to [facility] on [facility] in [place]. Said that she had been going there for 30 years. Michela Pitcher that they actually dismissed her (but did not state the reason why).  Michela Pitcher that her last office visit and last labs there were about 3 months prior to her visit here on 09/19/2013. She said that she went there for office visits every 3 months.  However said that she was out of all of her medications except for her diabetes and blood pressure medicines.  Stated that she was seeing no other medical providers except for that family practice. Has seen no gynecologist. Has had no mammogram or colonoscopy or other preventive care.  At that initial visit, she was not fasting so could not check lipid panel. At that visit we just checked CMET and A1c. CMET revealed elevated LFTs. Also showed A1c elevated at 8.9. At that time I said to stop the Crestor and also to find out if she was taking any Tylenol or drinking any alcohol.  At f/u OV 11/05/13  she said that she does not remember getting a message to stop the Crestor. Said that she was still taking Crestor. Said that she does not use any Tylenol and does not drink any type of alcohol. At the time of that lab result also said to increase metformin from 500 twice a day to 1000 mg twice a day. She said that she did make this change. Also said to add Actos 45 mg daily. She says that she did add this medication as well. As it turns out, when we rechecked LFTs on 11/05/13-- they were normal. This was on Crestor and we verified that she was on Crestor at the time of these LFTs were normal.  Therefore  we told her to just go ahead and stay on the Crestor and we will monitor LFTs. Today she reports that she is still on the Crestor.  She says that when she checks her blood sugar she always checks it fasting morning. Says that she checks it about once a week.  She did not bring in her blood sugar log with her today.  She had follow-up office visit with me on 11/05/2013 for complete physical exam.  Today she reports that she is taking the Crestor and has been on this this entire time. She is taking other medications as directed as well. Also verified that she is taking aspirin which was added at prior visit with me.  At 06/11/13 OV she reports that she only occasionally checks BS. Did not bring log.  I reveiwed last labs I did here. 03/11/14 A1C was 7.4---said to add Januvia 63m--she says she did add this and IS taking this daily.   At office visit 09/23/14 I reviewed the fact that at her initial visit with me she had told me that in the past, she had been on Zoloft 200 mg but at the time of that initial visit with me she was off Zoloft completely but was needing to resume it. At that time we started Zoloft at 50 mg with plans  to gradually titrate up the dose. However, thus far the 50 mg dose has been continued. Discussed this with her today and she states that the 50 mg dose seems to be working well for her and she does not feel that she needs to increase the dose. Is controlling her symptoms well as causing no adverse effects.  At visit 09/23/14 she does not bring in any type of blood sugar log or blood sugar reading reports. States that she is taking medications as directed. She has been educated regarding proper diet and exercise but is noncompliant with this.  09/23/2014 lab results showed  --LDL had increased significantly. Past labs that showed LDL 64 and 73 the current LDL at that time was 192. At f/u OV 12/25/14-- she states that "sometimes forgets to take Crestor ". At [place] 12/25/14-- I discussed  the above lab results and the fact that cholesterol is severely elevated without medication but well controlled with medication and the necessity for her to take Crestor on a daily basis. She voices understanding and agrees. ---Also at lab vitamin D was very low and she was told to take prescription strength weekly for 12 weeks then take 4000 units daily. At [place] 12/25/14--States that she is taking this as directed and I reminded her about using the 4000 units daily when she completes the prescription. --Also at lab A1c was 8.0. For her to continue all current medications and also add Glucotrol XL 10 mg daily. 12/25/14-- she states that she is taking this as directed.  At [place] --12/25/2014--- she did bring in paperwork she has been writing sugar readings.  States that she only checks fasting morning readings. She has the following readings: She has 36 readings----none of these are in the 90s. 3 of these are in the 100s. 5 of these are in the 110s. 5 are these are in the 120s.  She has a few readings less than 90--- one reading that's an 24, one that is 69, 1 that is 82, 1 that is 88. She has 4 readings that are higher at 136, 167, 172, 165  At OV 04/02/15: She states that she has stopped Ambien. Says that her family told her that she was doing "crazy things "at night after taking that medicine. States that she does not remember doing and has no recollection of those events. Patient states that she is taking her Crestor now. However she is not fasting today. Says that she drank a "root beer" this morning and had forgotten about needing to be fasting. Also asked about what dose of vitamin D she is currently taking. Reviewed that at lab 09/23/14 she was to do the 50,000 units weekly for 12 weeks then 4000 units daily. She does not seem certain about exactly what she is taking but says that it is prescription. She did not bring in any blood sugars today for me to review. She states that she is taking all medicines  as directed and having no adverse effects other than what is stated about Ambien above.  10/16/2015: Reviewed that at last office visit-- 07/10/15-- A1c was checked and came back 8.8. At that time-- recommended to start Invokana 300 mg daily. Also told patient that if A1c not at goal at recheck with have to add insulin. Told her to be very strict with her carb intake and addition to taking all of the medications. Today she states that she has been taking the Invokana daily. Also reviewed that at the time of her  last lipid panel June 2016 LDL came back 192 and at that time she reported that she had not been taking her Crestor every day. Followed up that issue at other OVs since then. Followed it up again today and she states that she is taking the Crestor every day. She is taking all medications as directed. However did review that her blood pressure is high today. She says that she usually takes her medicines in the morning but because she did not eat much today she was going to wait and take medicines with her dinner tonight. Discussed need to take medications at same time every day. As well when we further discussed whether she was fasting-- she is not. So, cannot check FLP.  01/19/2016: She has no complaints or concerns today. She is taking blood pressure medications as directed. No lightheadedness. Initially she had no complaints but then once I mention her blood pressure being somewhat high again today, as it was at last visit, she says that she has not been getting much sleep and asked if that could be affecting her high blood pressure. Says that the Remeron has no effect. She takes it but doesn't notice any difference. Says that she is getting very little sleep. I asked if she is napping during the day but she says no she does not nap. Says that she checks her blood sugar a couple of times a week fasting morning and gets usually around 130 - 140 - 150. However says that this morning it was  230. She is taking diabetic medicines as directed with no adverse effects. She is fasting today. She is taking cholesterol medication as directed with no adverse effects. Current dose of Zoloft is still working in controlling her anxiety and depression. Causing no adverse effects.  04/29/2016: Says she checks her BS most mornings. Gets ~ 150. She is taking all diabetic meds as directed. No adv effects.  She is taking statin as directed. No myalgias or other adv effects.  She is taking Zoloft daily. This is controlling her anxiety and depression. Is causing no adv effects.   07/28/2016: Reviewed that at lab 04/29/16-- A1c came back at 8.6 and had her add Trulicity. Today she reports that she did add the Trulicity and gives this every Monday. Says that she is having no adverse effects.  Says that she checks fasting blood sugar most mornings and gets around 160. She has no concerns to address today. She is taking all diabetic meds as directed. No adv effects.  She is taking statin as directed. No myalgias or other adv effects.  She is taking Zoloft daily. This is controlling her anxiety and depression. Is causing no adv effects.    11/04/2016: She reports that she checks fasting morning blood sugar about 2-3 times per week. Gets readings of "around 127 or 147 somewhere in that area." Noted at that at the last set of labs I had stated to make sure she was taking her Crestor and vitamin D daily and reviewed this again with her today. She reassures me that she is taking the Crestor (and vitamin D) on a daily basis and not skipping on this. She has no concerns to address today. She is taking all diabetic meds as directed. No adv effects.  She is taking statin as directed. No myalgias or other adv effects.  She is taking Zoloft daily. This is controlling her anxiety and depression. Is causing no adv effects.    Past Medical History:  Diagnosis  Date  . Allergy   . Anxiety 2013  . Asthma 2015  .  Depression 2013  . Diabetes mellitus 2004  . GERD (gastroesophageal reflux disease) 2013  . Hyperlipidemia 1992  . Hypertension 1992  . Memory impairment      Home Meds: Outpatient Medications Prior to Visit  Medication Sig Dispense Refill  . aspirin 81 MG tablet Take 1 tablet (81 mg total) by mouth daily. 30 tablet 11  . blood glucose meter kit and supplies KIT Inject 1 each into the skin as directed. Dispense based on patient and insurance preference. Check blood sugar fasting each morning.  ICD E11.65 1 each 0  . Blood Glucose Monitoring Suppl (FREESTYLE LITE) DEVI UAD.E11.65  0  . canagliflozin (INVOKANA) 300 MG TABS tablet Take 300 mg by mouth daily before breakfast.    . [facility] D3 2000 units CAPS Take 2 capsules (4,000 Units total) by mouth daily. 30 each 3  . Dulaglutide (TRULICITY) 5.27 PO/2.4MP SOPN Inject 0.75 mg into the skin once a week. Subcutaneous 4 pen 5  . glipiZIDE (GLUCOTROL XL) 10 MG 24 hr tablet TAKE 1 TABLET BY MOUTH DAILY WITH BREAKFAST. 90 tablet 0  . glucose blood test strip Dispense based on patient and insurance preference.  Check blood sugar fasting each morning.  ICD E11.65 50 each 12  . INVOKANA 100 MG TABS tablet Take 100 mg by mouth daily.  5  . JANUVIA 100 MG tablet TAKE 1 TABLET (100 MG TOTAL) BY MOUTH DAILY. 90 tablet 1  . Lancets MISC Dispense based on patient and insurance preference.  Check blood sugar fasting each morning.  ICD E11.65 100 each 3  . lisinopril-hydrochlorothiazide (ZESTORETIC) 20-12.5 MG tablet Take 1 tablet by mouth daily. 90 tablet 3  . LORazepam (ATIVAN) 1 MG tablet TAKE 1 TABLET BY MOUTH TWICE DAILY 60 tablet 2  . metFORMIN (GLUCOPHAGE) 1000 MG tablet Take 1 tablet (1,000 mg total) by mouth 2 (two) times daily with a meal. 180 tablet 3  . mirtazapine (REMERON) 30 MG tablet TAKE 1 TABLET (30 MG TOTAL) BY MOUTH AT BEDTIME. 30 tablet 0  . Nutritional Supplements (ESTROVEN PO) Take 1 tablet by mouth at bedtime. OTC    . omeprazole  (PRILOSEC) 20 MG capsule TAKE ONE CAPSULE EVERY DAY 30 capsule 1  . pioglitazone (ACTOS) 45 MG tablet Take 1 tablet (45 mg total) by mouth daily. 90 tablet 0  . rosuvastatin (CRESTOR) 40 MG tablet TAKE 1 TABLET (40 MG TOTAL) BY MOUTH DAILY. 30 tablet 4  . sertraline (ZOLOFT) 50 MG tablet TAKE 1 TABLET EVERY DAY 90 tablet 0  . Suvorexant (BELSOMRA) 15 MG TABS Take 1 tablet by mouth at bedtime as needed. 30 tablet 2  . TRULICITY 5.36 RW/4.3XV SOPN INJECT 0.75 MG (0.5 MLS) INTO THE SKIN ONCE A WEEK. 4 pen 5   No facility-administered medications prior to visit.     Allergies:  Allergies  Allergen Reactions  . Contrast Media [Iodinated Diagnostic Agents] Anaphylaxis  . Ambien [Zolpidem Tartrate] Other (See Comments)    Family has told her that after taking Ambien, she does "crazy things" that she does not remember doing.   . Codeine Nausea And Vomiting    Social History   Social History  . Marital status: Married    Spouse name: N/A  . Number of children: N/A  . Years of education: N/A   Occupational History  . Not on file.   Social History Main Topics  . Smoking  status: Never Smoker  . Smokeless tobacco: Never Used  . Alcohol use No  . Drug use: No  . Sexual activity: Yes   Other Topics Concern  . Not on file   Social History Narrative   Married.    Lives with husband and youngest son, who is 85 y/o.   Has 4 children.   Does not work outside of home.    No formal exercise. Only activity around the house.   Tries to be careful with Diabetic diet.    Family History  Problem Relation Age of Onset  . Cancer Father        Lymphoma, Leukemia  . Depression Father   . Cancer Maternal Grandfather   . Cancer Paternal Grandmother   . Diabetes Paternal Grandfather   . Kidney disease Paternal Grandfather      Review of Systems:  See HPI for pertinent ROS. All other ROS negative.    Physical Exam: Blood pressure 110/80, pulse 79, temperature (!) 97.4 F (36.3 C),  temperature source Oral, resp. rate 16, weight 239 lb 9.6 oz (108.7 kg), SpO2 99 %., Body mass index is 45.27 kg/m. General: Obese WF. Appears in no acute distress. Neck: Supple. No thyromegaly. No lymphadenopathy. No carotid bruit.  Lungs: Clear bilaterally to auscultation without wheezes, rales, or rhonchi. Breathing is unlabored. Heart: RRR with S1 S2. No murmurs, rubs, or gallops. Abdomen: Soft, non-tender, non-distended with normoactive bowel sounds. No hepatomegaly. No rebound/guarding. No obvious abdominal masses. Musculoskeletal:  Strength and tone normal for age. Extremities/Skin: Warm and dry.  Neuro: Alert and oriented X 3. Moves all extremities spontaneously. Gait is normal. CNII-XII grossly in tact. Psych:  Responds to questions appropriately with a normal affect. Diabetic Foot Exam: Inspection is normal.  Sensation is normal and intact.  1+ bilateral posterior tibial pulses and dorsalis pedis pulses.      ASSESSMENT AND PLAN:  56 y.o. year old female with    1. Type 2 diabetes mellitus with hyperglycemia  On ACE inhibitor On statin On ASA 81 mg daily  Micro albumin was done 11/05/2013, 12/25/2014, 04/29/2016  She reports that she does have annual diabetic eye exam -Diabetic Foot Exam is good  07/28/2016:  Check A1C, CMET---She is now on every class of meds except Inulin 11/03/2016--A1C  2. Hyperlipidemia She is taking Crestor. She has had elevated LFTs on one of the checks in the past but then they were normal at the most recent LFT check. LFTs norma lagain at check 03/2014 and 09/2014 03/2014 LDL at goal--at 73 09/2014 LDL was up significantly at 192. Discussed at follow-up visit 12/25/14 and patient states that she doesn't remember to take the Crestor daily. Discussed the variation in these lab results and the importance of taking the Crestor daily. She is agreeable to do so. At visit 04/02/15 she states that she is taking the Crestor daily. However she is not fasting  today so we'll wait to repeat FLP. At visit 01/2016 she is fasting so FLP rechecked.  04/29/2016: FLP/LFTs performed 01/2016. Told her to make sure to stay on statin. Reviewed LDL reduction she has gottne with use of statin and importance of compliance with this. Cont current dose, current statin.  07/28/2016: She is fasting. She is on Crestor. Recheck FLP/LFT. 11/03/2016--she assures me that she is taking the Crestor daily. She had FLP/LFT at last visit 07/2016 6 can wait to recheck this.   4. Essential hypertension BP was slightly elevated at visit 10/2015 and is slightly  elevated again 01/2016. At that visit lisinopril was increased from 10/12.5 up to 20/12.5. Last Potassium was good at 3.9. 04/29/2016: BP at goal. Cont current meds. BMET normal 01/2016.  07/28/2016: Blood Pressure at goal. Continue current medications. Check lab to monitor. 11/03/2016 blood pressure is at goal. Continue current medications. Lab was stable 07/2016 to monitor.  5. Vitamin D deficiency On labs 11/05/13 vitamin D was low at 23. Currently she is on vitamin D. I told her to start taking vitamin D over-the-counter 2000 units daily. Recheck Vitamin D at lab 09/23/2014--I am indeed very low. She was told to take prescription 50,000 units weekly for 12 weeks and when she completes this she is to take over-the-counter vitamin D 4000 units daily. At [place] 12/25/14 she states that she is taking vitamin D as directed and reminded her to take the over-the-counter supplements when she completes the prescription strength. She voices understanding and agrees. At [place] 04/02/15 she does not seem certain about what dose of vitamin D she is taking currently. We'll recheck vitamin D level now. 04/29/2016: Cont current dose Vit D 07/28/2016: Last vitamin D level was checked remotely ( > 1 year ago). Will recheck vitamin D level today. 11/03/16 she is to continue her vitamin D and make sure to take daily. Just checked level 4/18 6 can wait to recheck  lab.  5. Asthma, mild intermittent, uncomplicated 10/08/2261: : This is stable and controlled.  6. Gastroesophageal reflux disease, esophagitis presence not specified 11/03/2016:  This is stable and controlled.  7. Anxiety She was on Zoloft 200 mg in past, prior to coming to our office.  At [place] 09/19/2013 discussed: Need to start this back at low dose at 50 mg for couple weeks. Then can increase to 100 mg and then slowly will titrate back up as needed.  At [place] 09/23/2014---- reviewed with patient that she has continued on the above medications since then. She states that current medications are controlling her symptoms well. She is not feeling anxious or depressed with using Zoloft at 50 mg. Wants to continue current dose and current treatment.  11/04/2016:  This remains stable and controlled on current medications.   8. Depression See # 7 above. 11/04/2016:  This remains stable and controlled on current medications.  9. Insomnia At visit 04/02/15 she reports that Ambien has been causing her to do crazy things during the night that she does not recall doing. Therefore she has stopped the Ambien. At visit 04/02/15 prescribed Remeron 30 mg QHS. Told her to follow-up if this causes adverse effects or is not effective. At visit 01/2016 she states that the Remeron is ineffective. At visit changed her to [place].  10. GERD (gastroesophageal reflux disease) 11/04/2016: Symptoms are controlled with omeprazole daily.  11. Asthma 11/04/2016: This is stable/controlled with current medication.   She had CPE with me 2015 At ROVs since then, I have tried to update preventive care--such as at one OV I ordered mammogram--she was agreeable--but then she did not go for this---will discuss scheduling another CPE at next OV  Routine follow-up office visit here in 3 months or follow-up sooner if needed.   [address] [place], [place], [facility] 11/04/2016 9:38 AM

## 2016-11-04 NOTE — Telephone Encounter (Signed)
Refill appropriate 

## 2016-11-20 ENCOUNTER — Other Ambulatory Visit: Payer: Self-pay | Admitting: Physician Assistant

## 2016-11-22 NOTE — Telephone Encounter (Signed)
Refill appropriate 

## 2016-11-24 ENCOUNTER — Other Ambulatory Visit: Payer: Self-pay | Admitting: Physician Assistant

## 2016-11-25 NOTE — Telephone Encounter (Signed)
Last OV 11/04/2016 Last refill 08/17/2016 Ok to refill?

## 2016-11-25 NOTE — Telephone Encounter (Signed)
Approved # 60 + 2 

## 2016-11-25 NOTE — Telephone Encounter (Signed)
rx called in

## 2016-11-29 NOTE — Telephone Encounter (Signed)
Opened in Error.

## 2016-12-07 ENCOUNTER — Encounter: Payer: Self-pay | Admitting: Physician Assistant

## 2016-12-15 ENCOUNTER — Ambulatory Visit (INDEPENDENT_AMBULATORY_CARE_PROVIDER_SITE_OTHER): Payer: 59 | Admitting: Physician Assistant

## 2016-12-15 ENCOUNTER — Encounter: Payer: Self-pay | Admitting: Physician Assistant

## 2016-12-15 VITALS — BP 120/78 | HR 76 | Temp 97.3°F | Resp 18 | Ht 61.0 in | Wt 237.0 lb

## 2016-12-15 DIAGNOSIS — E1165 Type 2 diabetes mellitus with hyperglycemia: Secondary | ICD-10-CM | POA: Diagnosis not present

## 2016-12-15 DIAGNOSIS — IMO0001 Reserved for inherently not codable concepts without codable children: Secondary | ICD-10-CM

## 2016-12-15 MED ORDER — INSULIN GLARGINE 100 UNIT/ML SOLOSTAR PEN: PEN_INJECTOR | SUBCUTANEOUS | 99 refills | Status: DC

## 2016-12-15 MED ORDER — METFORMIN HCL 500 MG PO TABS: 500.0000 mg | ORAL_TABLET | Freq: Two times a day (BID) | ORAL | 3 refills | Status: DC

## 2016-12-15 NOTE — Progress Notes (Signed)
Patient ID: JAE BRUCK MRN: [id number], DOB: 05/31/1960, 56 y.o. Date of Encounter: _0 @  Chief Complaint:  Chief Complaint  Patient presents with  . Follow-up    3 mos    HPI: 56 y.o. year old white female  Presents for follow-up office visit.   She saw me on 09/19/13 as a new patient to establish care. She said that she had been going to [facility] on [facility] in [place]. Said that she had been going there for 30 years. Michela Pitcher that they actually dismissed her (but did not state the reason why).  Michela Pitcher that her last office visit and last labs there were about 3 months prior to her visit here on 09/19/2013. She said that she went there for office visits every 3 months.  However said that she was out of all of her medications except for her diabetes and blood pressure medicines.  Stated that she was seeing no other medical providers except for that family practice. Has seen no gynecologist. Has had no mammogram or colonoscopy or other preventive care.  At that initial visit, she was not fasting so could not check lipid panel. At that visit we just checked CMET and A1c. CMET revealed elevated LFTs. Also showed A1c elevated at 8.9. At that time I said to stop the Crestor and also to find out if she was taking any Tylenol or drinking any alcohol.  At f/u OV 11/05/13  she said that she does not remember getting a message to stop the Crestor. Said that she was still taking Crestor. Said that she does not use any Tylenol and does not drink any type of alcohol. At the time of that lab result also said to increase metformin from 500 twice a day to 1000 mg twice a day. She said that she did make this change. Also said to add Actos 45 mg daily. She says that she did add this medication as well. As it turns out, when we rechecked LFTs on 11/05/13-- they were normal. This was on Crestor and we verified that she was on Crestor at the time of these LFTs were normal.  Therefore  we told her to just go ahead and stay on the Crestor and we will monitor LFTs. Today she reports that she is still on the Crestor.  She says that when she checks her blood sugar she always checks it fasting morning. Says that she checks it about once a week.  She did not bring in her blood sugar log with her today.  She had follow-up office visit with me on 11/05/2013 for complete physical exam.  Today she reports that she is taking the Crestor and has been on this this entire time. She is taking other medications as directed as well. Also verified that she is taking aspirin which was added at prior visit with me.  At 56/9/15 OV she reports that she only occasionally checks BS. Did not bring log.  I reveiwed last labs I did here. 03/11/14 A1C was 7.4---said to add Januvia 63m--she says she did add this and IS taking this daily.   At office visit 09/23/14 I reviewed the fact that at her initial visit with me she had told me that in the past, she had been on Zoloft 200 mg but at the time of that initial visit with me she was off Zoloft completely but was needing to resume it. At that time we started Zoloft at 50 mg with plans  to gradually titrate up the dose. However, thus far the 50 mg dose has been continued. Discussed this with her today and she states that the 50 mg dose seems to be working well for her and she does not feel that she needs to increase the dose. Is controlling her symptoms well as causing no adverse effects.  At visit 09/23/14 she does not bring in any type of blood sugar log or blood sugar reading reports. States that she is taking medications as directed. She has been educated regarding proper diet and exercise but is noncompliant with this.  09/23/2014 lab results showed  --LDL had increased significantly. Past labs that showed LDL 64 and 73 the current LDL at that time was 192. At f/u OV 12/25/14-- she states that "sometimes forgets to take Crestor ". At [place] 12/25/14-- I discussed  the above lab results and the fact that cholesterol is severely elevated without medication but well controlled with medication and the necessity for her to take Crestor on a daily basis. She voices understanding and agrees. ---Also at lab vitamin D was very low and she was told to take prescription strength weekly for 12 weeks then take 4000 units daily. At [place] 12/25/14--States that she is taking this as directed and I reminded her about using the 4000 units daily when she completes the prescription. --Also at lab A1c was 8.0. For her to continue all current medications and also add Glucotrol XL 10 mg daily. 12/25/14-- she states that she is taking this as directed.  At [place] --56/21/2016--- she did bring in paperwork she has been writing sugar readings.  States that she only checks fasting morning readings. She has the following readings: She has 36 readings----none of these are in the 90s. 3 of these are in the 100s. 5 of these are in the 110s. 5 are these are in the 120s.  She has a few readings less than 90--- one reading that's an 24, one that is 69, 1 that is 82, 1 that is 88. She has 4 readings that are higher at 136, 167, 172, 165  At OV 04/02/15: She states that she has stopped Ambien. Says that her family told her that she was doing "crazy things "at night after taking that medicine. States that she does not remember doing and has no recollection of those events. Patient states that she is taking her Crestor now. However she is not fasting today. Says that she drank a "root beer" this morning and had forgotten about needing to be fasting. Also asked about what dose of vitamin D she is currently taking. Reviewed that at lab 09/23/14 she was to do the 50,000 units weekly for 12 weeks then 4000 units daily. She does not seem certain about exactly what she is taking but says that it is prescription. She did not bring in any blood sugars today for me to review. She states that she is taking all medicines  as directed and having no adverse effects other than what is stated about Ambien above.  10/16/2015: Reviewed that at last office visit-- 07/10/15-- A1c was checked and came back 8.8. At that time-- recommended to start Invokana 300 mg daily. Also told patient that if A1c not at goal at recheck with have to add insulin. Told her to be very strict with her carb intake and addition to taking all of the medications. Today she states that she has been taking the Invokana daily. Also reviewed that at the time of her  last lipid panel June 2016 LDL came back 192 and at that time she reported that she had not been taking her Crestor every day. Followed up that issue at other OVs since then. Followed it up again today and she states that she is taking the Crestor every day. She is taking all medications as directed. However did review that her blood pressure is high today. She says that she usually takes her medicines in the morning but because she did not eat much today she was going to wait and take medicines with her dinner tonight. Discussed need to take medications at same time every day. As well when we further discussed whether she was fasting-- she is not. So, cannot check FLP.  01/19/2016: She has no complaints or concerns today. She is taking blood pressure medications as directed. No lightheadedness. Initially she had no complaints but then once I mention her blood pressure being somewhat high again today, as it was at last visit, she says that she has not been getting much sleep and asked if that could be affecting her high blood pressure. Says that the Remeron has no effect. She takes it but doesn't notice any difference. Says that she is getting very little sleep. I asked if she is napping during the day but she says no she does not nap. Says that she checks her blood sugar a couple of times a week fasting morning and gets usually around 130 - 140 - 150. However says that this morning it was  230. She is taking diabetic medicines as directed with no adverse effects. She is fasting today. She is taking cholesterol medication as directed with no adverse effects. Current dose of Zoloft is still working in controlling her anxiety and depression. Causing no adverse effects.  04/29/2016: Says she checks her BS most mornings. Gets ~ 150. She is taking all diabetic meds as directed. No adv effects.  She is taking statin as directed. No myalgias or other adv effects.  She is taking Zoloft daily. This is controlling her anxiety and depression. Is causing no adv effects.   07/28/2016: Reviewed that at lab 04/29/16-- A1c came back at 8.6 and had her add Trulicity. Today she reports that she did add the Trulicity and gives this every Monday. Says that she is having no adverse effects.  Says that she checks fasting blood sugar most mornings and gets around 160. She has no concerns to address today. She is taking all diabetic meds as directed. No adv effects.  She is taking statin as directed. No myalgias or other adv effects.  She is taking Zoloft daily. This is controlling her anxiety and depression. Is causing no adv effects.    11/04/2016: She reports that she checks fasting morning blood sugar about 2-3 times per week. Gets readings of "around 127 or 147 somewhere in that area." Noted at that at the last set of labs I had stated to make sure she was taking her Crestor and vitamin D daily and reviewed this again with her today. She reassures me that she is taking the Crestor (and vitamin D) on a daily basis and not skipping on this. She has no concerns to address today. She is taking all diabetic meds as directed. No adv effects.  She is taking statin as directed. No myalgias or other adv effects.  She is taking Zoloft daily. This is controlling her anxiety and depression. Is causing no adv effects.   12/15/2016: At last office visit 11/04/16  A1c came back at 7.8.  At that time I noted that  she was on maximum dose of 5 different types of diabetic medications.  A1c still not controlled.  Recommended for her to come in for follow-up visit to discuss medication change/insulin. Patient now presents for that follow-up visit.   Past Medical History:  Diagnosis Date  . Allergy   . Anxiety 2013  . Asthma 2015  . Depression 2013  . Diabetes mellitus 2004  . GERD (gastroesophageal reflux disease) 2013  . Hyperlipidemia 1992  . Hypertension 1992  . Memory impairment      Home Meds: Outpatient Medications Prior to Visit  Medication Sig Dispense Refill  . aspirin 81 MG tablet Take 1 tablet (81 mg total) by mouth daily. 30 tablet 11  . blood glucose meter kit and supplies KIT Inject 1 each into the skin as directed. Dispense based on patient and insurance preference. Check blood sugar fasting each morning.  ICD E11.65 1 each 0  . Blood Glucose Monitoring Suppl (FREESTYLE LITE) DEVI UAD.E11.65  0  . [facility] D3 2000 units CAPS Take 2 capsules (4,000 Units total) by mouth daily. 30 each 3  . glucose blood test strip Dispense based on patient and insurance preference.  Check blood sugar fasting each morning.  ICD E11.65 50 each 12  . Lancets MISC Dispense based on patient and insurance preference.  Check blood sugar fasting each morning.  ICD E11.65 100 each 3  . lisinopril-hydrochlorothiazide (ZESTORETIC) 20-12.5 MG tablet Take 1 tablet by mouth daily. 90 tablet 3  . LORazepam (ATIVAN) 1 MG tablet TAKE 1 TABLET BY MOUTH TWICE A DAY 60 tablet 2  . Nutritional Supplements (ESTROVEN PO) Take 1 tablet by mouth at bedtime. OTC    . omeprazole (PRILOSEC) 20 MG capsule TAKE ONE CAPSULE EVERY DAY 30 capsule 1  . rosuvastatin (CRESTOR) 40 MG tablet TAKE 1 TABLET (40 MG TOTAL) BY MOUTH DAILY. 30 tablet 4  . canagliflozin (INVOKANA) 300 MG TABS tablet Take 300 mg by mouth daily before breakfast.    . Dulaglutide (TRULICITY) 8.33 XO/3.2NV SOPN Inject 0.75 mg into the skin once a week. Subcutaneous 4  pen 5  . glipiZIDE (GLUCOTROL XL) 10 MG 24 hr tablet TAKE 1 TABLET BY MOUTH DAILY WITH BREAKFAST. 90 tablet 3  . INVOKANA 100 MG TABS tablet Take 100 mg by mouth daily.  5  . JANUVIA 100 MG tablet TAKE 1 TABLET (100 MG TOTAL) BY MOUTH DAILY. 90 tablet 1  . metFORMIN (GLUCOPHAGE) 1000 MG tablet Take 1 tablet (1,000 mg total) by mouth 2 (two) times daily with a meal. 180 tablet 3  . mirtazapine (REMERON) 30 MG tablet TAKE 1 TABLET (30 MG TOTAL) BY MOUTH AT BEDTIME. 30 tablet 0  . pioglitazone (ACTOS) 45 MG tablet Take 1 tablet (45 mg total) by mouth daily. 90 tablet 0  . sertraline (ZOLOFT) 50 MG tablet TAKE 1 TABLET EVERY DAY 90 tablet 0  . Suvorexant (BELSOMRA) 15 MG TABS Take 1 tablet by mouth at bedtime as needed. 30 tablet 2  . TRULICITY 9.16 OM/6.0OK SOPN INJECT 0.75 MG (0.5 MLS) INTO THE SKIN ONCE A WEEK. 4 pen 5   No facility-administered medications prior to visit.     Allergies:  Allergies  Allergen Reactions  . Contrast Media [Iodinated Diagnostic Agents] Anaphylaxis  . Ambien [Zolpidem Tartrate] Other (See Comments)    Family has told her that after taking Ambien, she does "crazy things" that she does not remember  doing.   . Codeine Nausea And Vomiting    Social History   Social History  . Marital status: Married    Spouse name: N/A  . Number of children: N/A  . Years of education: N/A   Occupational History  . Not on file.   Social History Main Topics  . Smoking status: Never Smoker  . Smokeless tobacco: Never Used  . Alcohol use No  . Drug use: No  . Sexual activity: Yes   Other Topics Concern  . Not on file   Social History Narrative   Married.    Lives with husband and youngest son, who is 5 y/o.   Has 4 children.   Does not work outside of home.    No formal exercise. Only activity around the house.   Tries to be careful with Diabetic diet.    Family History  Problem Relation Age of Onset  . Cancer Father        Lymphoma, Leukemia  . Depression  Father   . Cancer Maternal Grandfather   . Cancer Paternal Grandmother   . Diabetes Paternal Grandfather   . Kidney disease Paternal Grandfather      Review of Systems:  See HPI for pertinent ROS. All other ROS negative.    Physical Exam: Blood pressure 120/78, pulse 76, temperature (!) 97.3 F (36.3 C), temperature source Oral, resp. rate 18, height _0  (1.549 m), weight 237 lb (107.5 kg), SpO2 97 %., Body mass index is 44.78 kg/m. General: Obese WF. Appears in no acute distress. Neck: Supple. No thyromegaly. No lymphadenopathy. No carotid bruit.  Lungs: Clear bilaterally to auscultation without wheezes, rales, or rhonchi. Breathing is unlabored. Heart: RRR with S1 S2. No murmurs, rubs, or gallops. Abdomen: Soft, non-tender, non-distended with normoactive bowel sounds. No hepatomegaly. No rebound/guarding. No obvious abdominal masses. Musculoskeletal:  Strength and tone normal for age. Extremities/Skin: Warm and dry.  Neuro: Alert and oriented X 3. Moves all extremities spontaneously. Gait is normal. CNII-XII grossly in tact. Psych:  Responds to questions appropriately with a normal affect. Diabetic Foot Exam: Inspection is normal.  Sensation is normal and intact.  1+ bilateral posterior tibial pulses and dorsalis pedis pulses.      ASSESSMENT AND PLAN:  56 y.o. year old female with   12/15/2016: Uncontrolled diabetes mellitus type 2 without complications, unspecified whether long term insulin use ([place]) At this time she is to stop all of the following medications:  Invokana, Trulicity, Glucotrol,  Januvia, Actos.  She reports that she had stopped the metformin secondary to nausea and GI upset. Told her that we will restart metformin at low dose. She will take 500 mg once daily for several days. If she is having no nausea or other GI side effects, then she can go up to taking this twice daily.  She will administer the Lantus as follows: She will give 10 units tonight. She  will give 15 units tomorrow night (tomorrow will take no oral medication except for metformin 500 mg) Then she will increase Lantus by 1 unit each night until fasting morning sugars are in the 100-150 range.  She is to document fasting morning sugars every morning and bring this to her next OV. Today I gave her a blood sugar log form and showed her how to fill this out--- she is to document the number of units of insulin given each evening and document fasting morning sugars each morning. She will schedule follow-up visit with me in approximately  10 days. I have given her savings card for Lantus and she should have $0 co-pay. If she gets home and has any questions or concerns, she can call me at any time in the interim. Otherwise follow-up visit approximate 10 days.  Note--her son has Type 1 Diabetes and pt has been on Trulicity so she has understanding of adjusting Insulin and she has experience with using pen.  - Insulin Glargine (LANTUS SOLOSTAR) 100 UNIT/ML Solostar Pen; Administer daily as directed----up to 30 units daily  Dispense: 5 pen; Refill: PRN - metFORMIN (GLUCOPHAGE) 500 MG tablet; Take 1 tablet (500 mg total) by mouth 2 (two) times daily with a meal.  Dispense: 60 tablet; Refill: 3    ----------------------------THE FOLLOWING IS COPIED FROM OV NOTE 11/04/2016------NOT ADDRESSED AT OV 12/15/2016--------------------------------------   1. Type 2 diabetes mellitus with hyperglycemia  On ACE inhibitor On statin On ASA 81 mg daily  Micro albumin was done 11/05/2013, 12/25/2014, 04/29/2016  She reports that she does have annual diabetic eye exam -Diabetic Foot Exam is good  07/28/2016:  Check A1C, CMET---She is now on every class of meds except Inulin 11/03/2016--A1C  2. Hyperlipidemia She is taking Crestor. She has had elevated LFTs on one of the checks in the past but then they were normal at the most recent LFT check. LFTs norma lagain at check 03/2014 and 09/2014 03/2014 LDL at  goal--at 73 09/2014 LDL was up significantly at 192. Discussed at follow-up visit 12/25/14 and patient states that she doesn't remember to take the Crestor daily. Discussed the variation in these lab results and the importance of taking the Crestor daily. She is agreeable to do so. At visit 04/02/15 she states that she is taking the Crestor daily. However she is not fasting today so we'll wait to repeat FLP. At visit 01/2016 she is fasting so FLP rechecked.  04/29/2016: FLP/LFTs performed 01/2016. Told her to make sure to stay on statin. Reviewed LDL reduction she has gottne with use of statin and importance of compliance with this. Cont current dose, current statin.  07/28/2016: She is fasting. She is on Crestor. Recheck FLP/LFT. 11/03/2016--she assures me that she is taking the Crestor daily. She had FLP/LFT at last visit 07/2016 6 can wait to recheck this.   4. Essential hypertension BP was slightly elevated at visit 10/2015 and is slightly elevated again 01/2016. At that visit lisinopril was increased from 10/12.5 up to 20/12.5. Last Potassium was good at 3.9. 04/29/2016: BP at goal. Cont current meds. BMET normal 01/2016.  07/28/2016: Blood Pressure at goal. Continue current medications. Check lab to monitor. 11/03/2016 blood pressure is at goal. Continue current medications. Lab was stable 07/2016 to monitor.  5. Vitamin D deficiency On labs 11/05/13 vitamin D was low at 23. Currently she is on vitamin D. I told her to start taking vitamin D over-the-counter 2000 units daily. Recheck Vitamin D at lab 09/23/2014--I am indeed very low. She was told to take prescription 50,000 units weekly for 12 weeks and when she completes this she is to take over-the-counter vitamin D 4000 units daily. At [place] 12/25/14 she states that she is taking vitamin D as directed and reminded her to take the over-the-counter supplements when she completes the prescription strength. She voices understanding and agrees. At [place] 04/02/15  she does not seem certain about what dose of vitamin D she is taking currently. We'll recheck vitamin D level now. 04/29/2016: Cont current dose Vit D 07/28/2016: Last vitamin D level was checked remotely ( >  1 year ago). Will recheck vitamin D level today. 11/03/16 she is to continue her vitamin D and make sure to take daily. Just checked level 4/18 6 can wait to recheck lab.  5. Asthma, mild intermittent, uncomplicated 11/06/4371: : This is stable and controlled.  6. Gastroesophageal reflux disease, esophagitis presence not specified 11/03/2016:  This is stable and controlled.  7. Anxiety She was on Zoloft 200 mg in past, prior to coming to our office.  At [place] 09/19/2013 discussed: Need to start this back at low dose at 50 mg for couple weeks. Then can increase to 100 mg and then slowly will titrate back up as needed.  At [place] 09/23/2014---- reviewed with patient that she has continued on the above medications since then. She states that current medications are controlling her symptoms well. She is not feeling anxious or depressed with using Zoloft at 50 mg. Wants to continue current dose and current treatment.  11/04/2016:  This remains stable and controlled on current medications.   8. Depression See # 7 above. 11/04/2016:  This remains stable and controlled on current medications.  9. Insomnia At visit 04/02/15 she reports that Ambien has been causing her to do crazy things during the night that she does not recall doing. Therefore she has stopped the Ambien. At visit 04/02/15 prescribed Remeron 30 mg QHS. Told her to follow-up if this causes adverse effects or is not effective. At visit 01/2016 she states that the Remeron is ineffective. At visit changed her to [place].  10. GERD (gastroesophageal reflux disease) 11/04/2016: Symptoms are controlled with omeprazole daily.  11. Asthma 11/04/2016: This is stable/controlled with current medication.   She had CPE with me 2015 At ROVs since then, I  have tried to update preventive care--such as at one OV I ordered mammogram--she was agreeable--but then she did not go for this---will discuss scheduling another CPE at next OV  Routine follow-up office visit here in 3 months or follow-up sooner if needed.   [address] [place], [place], [facility] 12/15/2016 2:42 PM

## 2016-12-16 ENCOUNTER — Telehealth: Payer: Self-pay | Admitting: Physician Assistant

## 2016-12-16 DIAGNOSIS — IMO0001 Reserved for inherently not codable concepts without codable children: Secondary | ICD-10-CM

## 2016-12-16 DIAGNOSIS — E1165 Type 2 diabetes mellitus with hyperglycemia: Principal | ICD-10-CM

## 2016-12-16 MED ORDER — INSULIN PEN NEEDLE 32G X 4 MM MISC: 3 refills | Status: DC

## 2016-12-16 NOTE — Telephone Encounter (Signed)
Called pt and she needed needles to go with her lantus pen. Rx for pen needles sent to pharm.

## 2016-12-16 NOTE — Telephone Encounter (Signed)
Pt needs needles for her lantus pen

## 2016-12-27 ENCOUNTER — Ambulatory Visit: Payer: 59 | Admitting: Physician Assistant

## 2016-12-30 ENCOUNTER — Encounter: Payer: Self-pay | Admitting: Physician Assistant

## 2017-01-04 ENCOUNTER — Encounter: Payer: Self-pay | Admitting: Physician Assistant

## 2017-01-05 ENCOUNTER — Encounter: Payer: Self-pay | Admitting: Physician Assistant

## 2017-01-05 ENCOUNTER — Ambulatory Visit (INDEPENDENT_AMBULATORY_CARE_PROVIDER_SITE_OTHER): Payer: 59 | Admitting: Physician Assistant

## 2017-01-05 VITALS — BP 122/64 | HR 76 | Temp 97.9°F | Resp 14 | Ht 61.0 in | Wt 240.0 lb

## 2017-01-05 DIAGNOSIS — Z23 Encounter for immunization: Secondary | ICD-10-CM

## 2017-01-05 DIAGNOSIS — IMO0001 Reserved for inherently not codable concepts without codable children: Secondary | ICD-10-CM

## 2017-01-05 DIAGNOSIS — E1165 Type 2 diabetes mellitus with hyperglycemia: Secondary | ICD-10-CM | POA: Diagnosis not present

## 2017-01-05 NOTE — Progress Notes (Signed)
Patient ID: JAE BRUCK MRN: [id number], DOB: 05/31/1960, 56 y.o. Date of Encounter: _0 @  Chief Complaint:  Chief Complaint  Patient presents with  . Follow-up    3 mos    HPI: 56 y.o. year old white female  Presents for follow-up office visit.   She saw me on 09/19/13 as a new patient to establish care. She said that she had been going to [facility] on [facility] in [place]. Said that she had been going there for 30 years. Michela Pitcher that they actually dismissed her (but did not state the reason why).  Michela Pitcher that her last office visit and last labs there were about 3 months prior to her visit here on 09/19/2013. She said that she went there for office visits every 3 months.  However said that she was out of all of her medications except for her diabetes and blood pressure medicines.  Stated that she was seeing no other medical providers except for that family practice. Has seen no gynecologist. Has had no mammogram or colonoscopy or other preventive care.  At that initial visit, she was not fasting so could not check lipid panel. At that visit we just checked CMET and A1c. CMET revealed elevated LFTs. Also showed A1c elevated at 8.9. At that time I said to stop the Crestor and also to find out if she was taking any Tylenol or drinking any alcohol.  At f/u OV 11/05/13  she said that she does not remember getting a message to stop the Crestor. Said that she was still taking Crestor. Said that she does not use any Tylenol and does not drink any type of alcohol. At the time of that lab result also said to increase metformin from 500 twice a day to 1000 mg twice a day. She said that she did make this change. Also said to add Actos 45 mg daily. She says that she did add this medication as well. As it turns out, when we rechecked LFTs on 11/05/13-- they were normal. This was on Crestor and we verified that she was on Crestor at the time of these LFTs were normal.  Therefore  we told her to just go ahead and stay on the Crestor and we will monitor LFTs. Today she reports that she is still on the Crestor.  She says that when she checks her blood sugar she always checks it fasting morning. Says that she checks it about once a week.  She did not bring in her blood sugar log with her today.  She had follow-up office visit with me on 11/05/2013 for complete physical exam.  Today she reports that she is taking the Crestor and has been on this this entire time. She is taking other medications as directed as well. Also verified that she is taking aspirin which was added at prior visit with me.  At 56/9/15 OV she reports that she only occasionally checks BS. Did not bring log.  I reveiwed last labs I did here. 03/11/14 A1C was 7.4---said to add Januvia 63m--she says she did add this and IS taking this daily.   At office visit 09/23/14 I reviewed the fact that at her initial visit with me she had told me that in the past, she had been on Zoloft 200 mg but at the time of that initial visit with me she was off Zoloft completely but was needing to resume it. At that time we started Zoloft at 50 mg with plans  to gradually titrate up the dose. However, thus far the 50 mg dose has been continued. Discussed this with her today and she states that the 50 mg dose seems to be working well for her and she does not feel that she needs to increase the dose. Is controlling her symptoms well as causing no adverse effects.  At visit 09/23/14 she does not bring in any type of blood sugar log or blood sugar reading reports. States that she is taking medications as directed. She has been educated regarding proper diet and exercise but is noncompliant with this.  09/23/2014 lab results showed  --LDL had increased significantly. Past labs that showed LDL 64 and 73 the current LDL at that time was 192. At f/u OV 12/25/14-- she states that "sometimes forgets to take Crestor ". At [place] 12/25/14-- I discussed  the above lab results and the fact that cholesterol is severely elevated without medication but well controlled with medication and the necessity for her to take Crestor on a daily basis. She voices understanding and agrees. ---Also at lab vitamin D was very low and she was told to take prescription strength weekly for 12 weeks then take 4000 units daily. At [place] 12/25/14--States that she is taking this as directed and I reminded her about using the 4000 units daily when she completes the prescription. --Also at lab A1c was 8.0. For her to continue all current medications and also add Glucotrol XL 10 mg daily. 12/25/14-- she states that she is taking this as directed.  At [place] --12/25/2014--- she did bring in paperwork she has been writing sugar readings.  States that she only checks fasting morning readings. She has the following readings: She has 36 readings----none of these are in the 90s. 3 of these are in the 100s. 5 of these are in the 110s. 5 are these are in the 120s.  She has a few readings less than 90--- one reading that's an 24, one that is 69, 1 that is 82, 1 that is 88. She has 4 readings that are higher at 136, 167, 172, 165  At OV 04/02/15: She states that she has stopped Ambien. Says that her family told her that she was doing "crazy things "at night after taking that medicine. States that she does not remember doing and has no recollection of those events. Patient states that she is taking her Crestor now. However she is not fasting today. Says that she drank a "root beer" this morning and had forgotten about needing to be fasting. Also asked about what dose of vitamin D she is currently taking. Reviewed that at lab 09/23/14 she was to do the 50,000 units weekly for 12 weeks then 4000 units daily. She does not seem certain about exactly what she is taking but says that it is prescription. She did not bring in any blood sugars today for me to review. She states that she is taking all medicines  as directed and having no adverse effects other than what is stated about Ambien above.  10/16/2015: Reviewed that at last office visit-- 07/10/15-- A1c was checked and came back 8.8. At that time-- recommended to start Invokana 300 mg daily. Also told patient that if A1c not at goal at recheck with have to add insulin. Told her to be very strict with her carb intake and addition to taking all of the medications. Today she states that she has been taking the Invokana daily. Also reviewed that at the time of her  last lipid panel June 2016 LDL came back 192 and at that time she reported that she had not been taking her Crestor every day. Followed up that issue at other OVs since then. Followed it up again today and she states that she is taking the Crestor every day. She is taking all medications as directed. However did review that her blood pressure is high today. She says that she usually takes her medicines in the morning but because she did not eat much today she was going to wait and take medicines with her dinner tonight. Discussed need to take medications at same time every day. As well when we further discussed whether she was fasting-- she is not. So, cannot check FLP.  01/19/2016: She has no complaints or concerns today. She is taking blood pressure medications as directed. No lightheadedness. Initially she had no complaints but then once I mention her blood pressure being somewhat high again today, as it was at last visit, she says that she has not been getting much sleep and asked if that could be affecting her high blood pressure. Says that the Remeron has no effect. She takes it but doesn't notice any difference. Says that she is getting very little sleep. I asked if she is napping during the day but she says no she does not nap. Says that she checks her blood sugar a couple of times a week fasting morning and gets usually around 130 - 140 - 150. However says that this morning it was  230. She is taking diabetic medicines as directed with no adverse effects. She is fasting today. She is taking cholesterol medication as directed with no adverse effects. Current dose of Zoloft is still working in controlling her anxiety and depression. Causing no adverse effects.  04/29/2016: Says she checks her BS most mornings. Gets ~ 150. She is taking all diabetic meds as directed. No adv effects.  She is taking statin as directed. No myalgias or other adv effects.  She is taking Zoloft daily. This is controlling her anxiety and depression. Is causing no adv effects.   07/28/2016: Reviewed that at lab 04/29/16-- A1c came back at 8.6 and had her add Trulicity. Today she reports that she did add the Trulicity and gives this every Monday. Says that she is having no adverse effects.  Says that she checks fasting blood sugar most mornings and gets around 160. She has no concerns to address today. She is taking all diabetic meds as directed. No adv effects.  She is taking statin as directed. No myalgias or other adv effects.  She is taking Zoloft daily. This is controlling her anxiety and depression. Is causing no adv effects.    11/04/2016: She reports that she checks fasting morning blood sugar about 2-3 times per week. Gets readings of "around 127 or 147 somewhere in that area." Noted at that at the last set of labs I had stated to make sure she was taking her Crestor and vitamin D daily and reviewed this again with her today. She reassures me that she is taking the Crestor (and vitamin D) on a daily basis and not skipping on this. She has no concerns to address today. She is taking all diabetic meds as directed. No adv effects.  She is taking statin as directed. No myalgias or other adv effects.  She is taking Zoloft daily. This is controlling her anxiety and depression. Is causing no adv effects.    12/15/2016: At last office visit  11/04/16 A1c came back at 7.8.  At that time I noted that  she was on maximum dose of 5 different types of diabetic medications.  A1c still not controlled.  Recommended for her to come in for follow-up visit to discuss medication change/insulin. Patient now presents for that follow-up visit.  A/P AT THAT OV-----12/15/2016: Uncontrolled diabetes mellitus type 2 without complications, unspecified whether long term insulin use ([place]) At this time she is to stop all of the following medications:  Invokana, Trulicity, Glucotrol,  Januvia, Actos.  She reports that she had stopped the metformin secondary to nausea and GI upset. Told her that we will restart metformin at low dose. She will take 500 mg once daily for several days. If she is having no nausea or other GI side effects, then she can go up to taking this twice daily.  She will administer the Lantus as follows: She will give 10 units tonight. She will give 15 units tomorrow night (tomorrow will take no oral medication except for metformin 500 mg) Then she will increase Lantus by 1 unit each night until fasting morning sugars are in the 100-150 range.  She is to document fasting morning sugars every morning and bring this to her next OV. Today I gave her a blood sugar log form and showed her how to fill this out--- she is to document the number of units of insulin given each evening and document fasting morning sugars each morning. She will schedule follow-up visit with me in approximately 10 days. I have given her savings card for Lantus and she should have $0 co-pay. If she gets home and has any questions or concerns, she can call me at any time in the interim. Otherwise follow-up visit approximate 10 days.  Note--her son has Type 1 Diabetes and pt has been on Trulicity so she has understanding of adjusting Insulin and she has experience with using pen.  - Insulin Glargine (LANTUS SOLOSTAR) 100 UNIT/ML Solostar Pen; Administer daily as directed----up to 30 units daily  Dispense: 5 pen; Refill:  PRN - metFORMIN (GLUCOPHAGE) 500 MG tablet; Take 1 tablet (500 mg total) by mouth 2 (two) times daily with a meal.  Dispense: 60 tablet; Refill: 3    01/05/2017: Today she reports that she is taking the metformin 1 by mouth twice a day. Says that it is causing a little bit of abdominal discomfort but not much.  Today she does bring her blood sugar log form and has been filling this incorrectly. She did give the 10 units on that first night after last visit and then did increase to the 15 units the following night. She has been increasing by 1 unit each night since then. However her fasting blood sugars are not coming down much.  She is now up to giving 37 units of insulin last night.  Fasting morning blood sugars have been as follows ever since her last visit daily morning sugars have been: 173, 210, 155, 196, 180, 190, 228, 225, 202, 200, 210, 234, 200, 210, 225, 241, 220, 221, 194, 240, 230, 236     Past Medical History:  Diagnosis Date  . Allergy   . Anxiety 2013  . Asthma 2015  . Depression 2013  . Diabetes mellitus 2004  . GERD (gastroesophageal reflux disease) 2013  . Hyperlipidemia 1992  . Hypertension 1992  . Memory impairment      Home Meds: Outpatient Medications Prior to Visit  Medication Sig Dispense Refill  . aspirin 81  MG tablet Take 1 tablet (81 mg total) by mouth daily. 30 tablet 11  . blood glucose meter kit and supplies KIT Inject 1 each into the skin as directed. Dispense based on patient and insurance preference. Check blood sugar fasting each morning.  ICD E11.65 1 each 0  . Blood Glucose Monitoring Suppl (FREESTYLE LITE) DEVI UAD.E11.65  0  . [facility] D3 2000 units CAPS Take 2 capsules (4,000 Units total) by mouth daily. 30 each 3  . glucose blood test strip Dispense based on patient and insurance preference.  Check blood sugar fasting each morning.  ICD E11.65 50 each 12  . Insulin Glargine (LANTUS SOLOSTAR) 100 UNIT/ML Solostar Pen Administer daily as  directed----up to 30 units daily 5 pen PRN  . Insulin Pen Needle 32G X 4 MM MISC Use with insulin qd 100 each 3  . Lancets MISC Dispense based on patient and insurance preference.  Check blood sugar fasting each morning.  ICD E11.65 100 each 3  . lisinopril-hydrochlorothiazide (ZESTORETIC) 20-12.5 MG tablet Take 1 tablet by mouth daily. 90 tablet 3  . LORazepam (ATIVAN) 1 MG tablet TAKE 1 TABLET BY MOUTH TWICE A DAY 60 tablet 2  . metFORMIN (GLUCOPHAGE) 500 MG tablet Take 1 tablet (500 mg total) by mouth 2 (two) times daily with a meal. 60 tablet 3  . Nutritional Supplements (ESTROVEN PO) Take 1 tablet by mouth at bedtime. OTC    . omeprazole (PRILOSEC) 20 MG capsule TAKE ONE CAPSULE EVERY DAY 30 capsule 1  . rosuvastatin (CRESTOR) 40 MG tablet TAKE 1 TABLET (40 MG TOTAL) BY MOUTH DAILY. 30 tablet 4   No facility-administered medications prior to visit.     Allergies:  Allergies  Allergen Reactions  . Contrast Media [Iodinated Diagnostic Agents] Anaphylaxis  . Ambien [Zolpidem Tartrate] Other (See Comments)    Family has told her that after taking Ambien, she does "crazy things" that she does not remember doing.   . Codeine Nausea And Vomiting    Social History   Social History  . Marital status: Married    Spouse name: N/A  . Number of children: N/A  . Years of education: N/A   Occupational History  . Not on file.   Social History Main Topics  . Smoking status: Never Smoker  . Smokeless tobacco: Never Used  . Alcohol use No  . Drug use: No  . Sexual activity: Yes   Other Topics Concern  . Not on file   Social History Narrative   Married.    Lives with husband and youngest son, who is 78 y/o.   Has 4 children.   Does not work outside of home.    No formal exercise. Only activity around the house.   Tries to be careful with Diabetic diet.    Family History  Problem Relation Age of Onset  . Cancer Father        Lymphoma, Leukemia  . Depression Father   . Cancer  Maternal Grandfather   . Cancer Paternal Grandmother   . Diabetes Paternal Grandfather   . Kidney disease Paternal Grandfather      Review of Systems:  See HPI for pertinent ROS. All other ROS negative.    Physical Exam: Blood pressure 122/64, pulse 76, temperature 97.9 F (36.6 C), temperature source Oral, resp. rate 14, height '5\' 1"'$  (1.549 m), weight 108.9 kg (240 lb), SpO2 97 %., Body mass index is 45.35 kg/m. General: Obese WF. Appears in no acute distress. Neck:  Supple. No thyromegaly. No lymphadenopathy. No carotid bruit.  Lungs: Clear bilaterally to auscultation without wheezes, rales, or rhonchi. Breathing is unlabored. Heart: RRR with S1 S2. No murmurs, rubs, or gallops. Abdomen: Soft, non-tender, non-distended with normoactive bowel sounds. No hepatomegaly. No rebound/guarding. No obvious abdominal masses. Musculoskeletal:  Strength and tone normal for age. Extremities/Skin: Warm and dry.  Neuro: Alert and oriented X 3. Moves all extremities spontaneously. Gait is normal. CNII-XII grossly in tact. Psych:  Responds to questions appropriately with a normal affect. Diabetic Foot Exam: Inspection is normal.  Sensation is normal and intact.  1+ bilateral posterior tibial pulses and dorsalis pedis pulses.      ASSESSMENT AND PLAN:  56 y.o. year old female with   12/15/2016: Uncontrolled diabetes mellitus type 2 without complications, unspecified whether long term insulin use ([facility])  Continue the metformin 500 mg twice a day. If abdominal/GI side effects worsen then call and notify me. Regarding her insulin dosing she is to do the following: Tonight give 45 units. Then increase by 5 units each night until her fasting blood sugars get down to around 180 Then increase by 1 unit each evening until fasting sugars get to around 100-150. I discussed this with her and she voices understanding. Skin is that we definitely do not want blood sugar to get too low so deftly do not want to  give too much insulin. She voices understanding and agrees.  She is to have follow-up visit in one week. Follow-up sooner if needed. Call if has any questions or concerns about dosing.     ----------------------------THE FOLLOWING IS COPIED FROM OV NOTE 11/04/2016------NOT ADDRESSED AT OV 12/15/2016--------------------------------------   1. Type 2 diabetes mellitus with hyperglycemia  On ACE inhibitor On statin On ASA 81 mg daily  Micro albumin was done 11/05/2013, 12/25/2014, 04/29/2016  She reports that she does have annual diabetic eye exam -Diabetic Foot Exam is good  07/28/2016:  Check A1C, CMET---She is now on every class of meds except Inulin 11/03/2016--A1C  2. Hyperlipidemia She is taking Crestor. She has had elevated LFTs on one of the checks in the past but then they were normal at the most recent LFT check. LFTs norma lagain at check 03/2014 and 09/2014 03/2014 LDL at goal--at 73 09/2014 LDL was up significantly at 192. Discussed at follow-up visit 12/25/14 and patient states that she doesn't remember to take the Crestor daily. Discussed the variation in these lab results and the importance of taking the Crestor daily. She is agreeable to do so. At visit 04/02/15 she states that she is taking the Crestor daily. However she is not fasting today so we'll wait to repeat FLP. At visit 01/2016 she is fasting so FLP rechecked.  04/29/2016: FLP/LFTs performed 01/2016. Told her to make sure to stay on statin. Reviewed LDL reduction she has gottne with use of statin and importance of compliance with this. Cont current dose, current statin.  07/28/2016: She is fasting. She is on Crestor. Recheck FLP/LFT. 11/03/2016--she assures me that she is taking the Crestor daily. She had FLP/LFT at last visit 07/2016 6 can wait to recheck this.   4. Essential hypertension BP was slightly elevated at visit 10/2015 and is slightly elevated again 01/2016. At that visit lisinopril was increased from 10/12.5  up to 20/12.5. Last Potassium was good at 3.9. 04/29/2016: BP at goal. Cont current meds. BMET normal 01/2016.  07/28/2016: Blood Pressure at goal. Continue current medications. Check lab to monitor. 11/03/2016 blood pressure is at goal.  Continue current medications. Lab was stable 07/2016 to monitor.  5. Vitamin D deficiency On labs 11/05/13 vitamin D was low at 23. Currently she is on vitamin D. I told her to start taking vitamin D over-the-counter 2000 units daily. Recheck Vitamin D at lab 09/23/2014--I am indeed very low. She was told to take prescription 50,000 units weekly for 12 weeks and when she completes this she is to take over-the-counter vitamin D 4000 units daily. At [place] 12/25/14 she states that she is taking vitamin D as directed and reminded her to take the over-the-counter supplements when she completes the prescription strength. She voices understanding and agrees. At [place] 04/02/15 she does not seem certain about what dose of vitamin D she is taking currently. We'll recheck vitamin D level now. 04/29/2016: Cont current dose Vit D 07/28/2016: Last vitamin D level was checked remotely ( > 1 year ago). Will recheck vitamin D level today. 11/03/16 she is to continue her vitamin D and make sure to take daily. Just checked level 4/18 6 can wait to recheck lab.  5. Asthma, mild intermittent, uncomplicated 12/11/3530: : This is stable and controlled.  6. Gastroesophageal reflux disease, esophagitis presence not specified 11/03/2016:  This is stable and controlled.  7. Anxiety She was on Zoloft 200 mg in past, prior to coming to our office.  At [place] 09/19/2013 discussed: Need to start this back at low dose at 50 mg for couple weeks. Then can increase to 100 mg and then slowly will titrate back up as needed.  At [place] 09/23/2014---- reviewed with patient that she has continued on the above medications since then. She states that current medications are controlling her symptoms well. She is not feeling anxious  or depressed with using Zoloft at 50 mg. Wants to continue current dose and current treatment.  11/04/2016:  This remains stable and controlled on current medications.   8. Depression See # 7 above. 11/04/2016:  This remains stable and controlled on current medications.  9. Insomnia At visit 04/02/15 she reports that Ambien has been causing her to do crazy things during the night that she does not recall doing. Therefore she has stopped the Ambien. At visit 04/02/15 prescribed Remeron 30 mg QHS. Told her to follow-up if this causes adverse effects or is not effective. At visit 01/2016 she states that the Remeron is ineffective. At visit changed her to [place].  10. GERD (gastroesophageal reflux disease) 11/04/2016: Symptoms are controlled with omeprazole daily.  11. Asthma 11/04/2016: This is stable/controlled with current medication.   She had CPE with me 2015 At ROVs since then, I have tried to update preventive care--such as at one OV I ordered mammogram--she was agreeable--but then she did not go for this---will discuss scheduling another CPE at next OV  Routine follow-up office visit here in 3 months or follow-up sooner if needed.   Marin Olp [place], [place], [facility] 01/05/2017 2:27 PM

## 2017-01-08 ENCOUNTER — Other Ambulatory Visit: Payer: Self-pay | Admitting: Physician Assistant

## 2017-01-08 DIAGNOSIS — I10 Essential (primary) hypertension: Secondary | ICD-10-CM

## 2017-01-10 ENCOUNTER — Other Ambulatory Visit: Payer: Self-pay | Admitting: Physician Assistant

## 2017-01-10 MED ORDER — MIRTAZAPINE 30 MG PO TABS
30.0000 mg | ORAL_TABLET | Freq: Every day | ORAL | 5 refills | Status: DC
Start: 2017-01-10 — End: 2017-07-05

## 2017-01-10 NOTE — Telephone Encounter (Signed)
Approved.  #[id number]. 

## 2017-01-10 NOTE — Telephone Encounter (Signed)
Ok to refill 

## 2017-01-12 ENCOUNTER — Ambulatory Visit (INDEPENDENT_AMBULATORY_CARE_PROVIDER_SITE_OTHER): Payer: 59 | Admitting: Physician Assistant

## 2017-01-12 ENCOUNTER — Encounter: Payer: Self-pay | Admitting: Physician Assistant

## 2017-01-12 VITALS — BP 124/70 | HR 68 | Temp 97.9°F | Resp 14 | Ht 61.0 in | Wt 236.0 lb

## 2017-01-12 DIAGNOSIS — E1165 Type 2 diabetes mellitus with hyperglycemia: Secondary | ICD-10-CM | POA: Diagnosis not present

## 2017-01-12 DIAGNOSIS — IMO0001 Reserved for inherently not codable concepts without codable children: Secondary | ICD-10-CM

## 2017-01-12 NOTE — Progress Notes (Addendum)
Patient ID: JAE BRUCK MRN: [id number], DOB: 05/31/1960, 56 y.o. Date of Encounter: _0 @  Chief Complaint:  Chief Complaint  Patient presents with  . Follow-up    3 mos    HPI: 56 y.o. year old white female  Presents for follow-up office visit.   She saw me on 09/19/13 as a new patient to establish care. She said that she had been going to [place] on [facility] in [place]. Said that she had been going there for 30 years. Alicia Pope that they actually dismissed her (but did not state the reason why).  Alicia Pope that her last office visit and last labs there were about 3 months prior to her visit here on 09/19/2013. She said that she went there for office visits every 3 months.  However said that she was out of all of her medications except for her diabetes and blood pressure medicines.  Stated that she was seeing no other medical providers except for that family practice. Has seen no gynecologist. Has had no mammogram or colonoscopy or other preventive care.  At that initial visit, she was not fasting so could not check lipid panel. At that visit we just checked CMET and A1c. CMET revealed elevated LFTs. Also showed A1c elevated at 8.9. At that time I said to stop the Crestor and also to find out if she was taking any Tylenol or drinking any alcohol.  At f/u OV 11/05/13  she said that she does not remember getting a message to stop the Crestor. Said that she was still taking Crestor. Said that she does not use any Tylenol and does not drink any type of alcohol. At the time of that lab result also said to increase metformin from 500 twice a day to 1000 mg twice a day. She said that she did make this change. Also said to add Actos 45 mg daily. She says that she did add this medication as well. As it turns out, when we rechecked LFTs on 11/05/13-- they were normal. This was on Crestor and we verified that she was on Crestor at the time of these LFTs were normal.  Therefore  we told her to just go ahead and stay on the Crestor and we will monitor LFTs. Today she reports that she is still on the Crestor.  She says that when she checks her blood sugar she always checks it fasting morning. Says that she checks it about once a week.  She did not bring in her blood sugar log with her today.  She had follow-up office visit with me on 11/05/2013 for complete physical exam.  Today she reports that she is taking the Crestor and has been on this this entire time. She is taking other medications as directed as well. Also verified that she is taking aspirin which was added at prior visit with me.  At 06/11/13 OV she reports that she only occasionally checks BS. Did not bring log.  I reveiwed last labs I did here. 03/11/14 A1C was 7.4---said to add Januvia 63m--she says she did add this and IS taking this daily.   At office visit 09/23/14 I reviewed the fact that at her initial visit with me she had told me that in the past, she had been on Zoloft 200 mg but at the time of that initial visit with me she was off Zoloft completely but was needing to resume it. At that time we started Zoloft at 50 mg with plans  to gradually titrate up the dose. However, thus far the 50 mg dose has been continued. Discussed this with her today and she states that the 50 mg dose seems to be working well for her and she does not feel that she needs to increase the dose. Is controlling her symptoms well as causing no adverse effects.  At visit 09/23/14 she does not bring in any type of blood sugar log or blood sugar reading reports. States that she is taking medications as directed. She has been educated regarding proper diet and exercise but is noncompliant with this.  09/23/2014 lab results showed  --LDL had increased significantly. Past labs that showed LDL 64 and 73 the current LDL at that time was 192. At f/u OV 12/25/14-- she states that "sometimes forgets to take Crestor ". At [place] 12/25/14-- I discussed  the above lab results and the fact that cholesterol is severely elevated without medication but well controlled with medication and the necessity for her to take Crestor on a daily basis. She voices understanding and agrees. ---Also at lab vitamin D was very low and she was told to take prescription strength weekly for 12 weeks then take 4000 units daily. At [place] 12/25/14--States that she is taking this as directed and I reminded her about using the 4000 units daily when she completes the prescription. --Also at lab A1c was 8.0. For her to continue all current medications and also add Glucotrol XL 10 mg daily. 12/25/14-- she states that she is taking this as directed.  At [place] --12/25/2014--- she did bring in paperwork she has been writing sugar readings.  States that she only checks fasting morning readings. She has the following readings: She has 36 readings----none of these are in the 90s. 3 of these are in the 100s. 5 of these are in the 110s. 5 are these are in the 120s.  She has a few readings less than 90--- one reading that's an 24, one that is 69, 1 that is 82, 1 that is 88. She has 4 readings that are higher at 136, 167, 172, 165  At OV 04/02/15: She states that she has stopped Ambien. Says that her family told her that she was doing "crazy things "at night after taking that medicine. States that she does not remember doing and has no recollection of those events. Patient states that she is taking her Crestor now. However she is not fasting today. Says that she drank a "root beer" this morning and had forgotten about needing to be fasting. Also asked about what dose of vitamin D she is currently taking. Reviewed that at lab 09/23/14 she was to do the 50,000 units weekly for 12 weeks then 4000 units daily. She does not seem certain about exactly what she is taking but says that it is prescription. She did not bring in any blood sugars today for me to review. She states that she is taking all medicines  as directed and having no adverse effects other than what is stated about Ambien above.  10/16/2015: Reviewed that at last office visit-- 07/10/15-- A1c was checked and came back 8.8. At that time-- recommended to start Invokana 300 mg daily. Also told patient that if A1c not at goal at recheck with have to add insulin. Told her to be very strict with her carb intake and addition to taking all of the medications. Today she states that she has been taking the Invokana daily. Also reviewed that at the time of her  last lipid panel June 2016 LDL came back 192 and at that time she reported that she had not been taking her Crestor every day. Followed up that issue at other OVs since then. Followed it up again today and she states that she is taking the Crestor every day. She is taking all medications as directed. However did review that her blood pressure is high today. She says that she usually takes her medicines in the morning but because she did not eat much today she was going to wait and take medicines with her dinner tonight. Discussed need to take medications at same time every day. As well when we further discussed whether she was fasting-- she is not. So, cannot check FLP.  01/19/2016: She has no complaints or concerns today. She is taking blood pressure medications as directed. No lightheadedness. Initially she had no complaints but then once I mention her blood pressure being somewhat high again today, as it was at last visit, she says that she has not been getting much sleep and asked if that could be affecting her high blood pressure. Says that the Remeron has no effect. She takes it but doesn't notice any difference. Says that she is getting very little sleep. I asked if she is napping during the day but she says no she does not nap. Says that she checks her blood sugar a couple of times a week fasting morning and gets usually around 130 - 140 - 150. However says that this morning it was  230. She is taking diabetic medicines as directed with no adverse effects. She is fasting today. She is taking cholesterol medication as directed with no adverse effects. Current dose of Zoloft is still working in controlling her anxiety and depression. Causing no adverse effects.  04/29/2016: Says she checks her BS most mornings. Gets ~ 150. She is taking all diabetic meds as directed. No adv effects.  She is taking statin as directed. No myalgias or other adv effects.  She is taking Zoloft daily. This is controlling her anxiety and depression. Is causing no adv effects.   07/28/2016: Reviewed that at lab 04/29/16-- A1c came back at 8.6 and had her add Trulicity. Today she reports that she did add the Trulicity and gives this every Monday. Says that she is having no adverse effects.  Says that she checks fasting blood sugar most mornings and gets around 160. She has no concerns to address today. She is taking all diabetic meds as directed. No adv effects.  She is taking statin as directed. No myalgias or other adv effects.  She is taking Zoloft daily. This is controlling her anxiety and depression. Is causing no adv effects.    11/04/2016: She reports that she checks fasting morning blood sugar about 2-3 times per week. Gets readings of "around 127 or 147 somewhere in that area." Noted at that at the last set of labs I had stated to make sure she was taking her Crestor and vitamin D daily and reviewed this again with her today. She reassures me that she is taking the Crestor (and vitamin D) on a daily basis and not skipping on this. She has no concerns to address today. She is taking all diabetic meds as directed. No adv effects.  She is taking statin as directed. No myalgias or other adv effects.  She is taking Zoloft daily. This is controlling her anxiety and depression. Is causing no adv effects.    12/15/2016: At last office visit  11/04/16 A1c came back at 7.8.  At that time I noted that  she was on maximum dose of 5 different types of diabetic medications.  A1c still not controlled.  Recommended for her to come in for follow-up visit to discuss medication change/insulin. Patient now presents for that follow-up visit.  A/P AT THAT OV-----12/15/2016: Uncontrolled diabetes mellitus type 2 without complications, unspecified whether long term insulin use ([place]) At this time she is to stop all of the following medications:  Invokana, Trulicity, Glucotrol,  Januvia, Actos.  She reports that she had stopped the metformin secondary to nausea and GI upset. Told her that we will restart metformin at low dose. She will take 500 mg once daily for several days. If she is having no nausea or other GI side effects, then she can go up to taking this twice daily.  She will administer the Lantus as follows: She will give 10 units tonight. She will give 15 units tomorrow night (tomorrow will take no oral medication except for metformin 500 mg) Then she will increase Lantus by 1 unit each night until fasting morning sugars are in the 100-150 range.  She is to document fasting morning sugars every morning and bring this to her next OV. Today I gave her a blood sugar log form and showed her how to fill this out--- she is to document the number of units of insulin given each evening and document fasting morning sugars each morning. She will schedule follow-up visit with me in approximately 10 days. I have given her savings card for Lantus and she should have $0 co-pay. If she gets home and has any questions or concerns, she can call me at any time in the interim. Otherwise follow-up visit approximate 10 days.  Note--her son has Type 1 Diabetes and pt has been on Trulicity so she has understanding of adjusting Insulin and she has experience with using pen.  - Insulin Glargine (LANTUS SOLOSTAR) 100 UNIT/ML Solostar Pen; Administer daily as directed----up to 30 units daily  Dispense: 5 pen; Refill:  PRN - metFORMIN (GLUCOPHAGE) 500 MG tablet; Take 1 tablet (500 mg total) by mouth 2 (two) times daily with a meal.  Dispense: 60 tablet; Refill: 3     01/05/2017: Today she reports that she is taking the metformin 1 by mouth twice a day. Says that it is causing a little bit of abdominal discomfort but not much.  Today she does bring her blood sugar log form and has been filling this incorrectly. She did give the 10 units on that first night after last visit and then did increase to the 15 units the following night. She has been increasing by 1 unit each night since then. However her fasting blood sugars are not coming down much.  She is now up to giving 37 units of insulin last night.  Fasting morning blood sugars have been as follows ever since her last visit daily morning sugars have been: 173, 210, 155, 196, 180, 190, 228, 225, 202, 200, 210, 234, 200, 210, 225, 241, 220, 221, 194, 240, 230, 236  A/P AT OV 01/05/2017: Uncontrolled diabetes mellitus type 2 without complications, unspecified whether long term insulin use ([facility])  Continue the metformin 500 mg twice a day. If abdominal/GI side effects worsen then call and notify me. Regarding her insulin dosing she is to do the following: Tonight give 45 units. Then increase by 5 units each night until her fasting blood sugars get down to around 180 Then  increase by 1 unit each evening until fasting sugars get to around 100-150. I discussed this with her and she voices understanding. Skin is that we definitely do not want blood sugar to get too low so deftly do not want to give too much insulin. She voices understanding and agrees.  She is to have follow-up visit in one week. Follow-up sooner if needed. Call if has any questions or concerns about dosing.   01/12/2017: Today she states that she is still tolerating the metformin 500 mg twice a day. Today she states that she did increase the insulin by 5 units at a time some nights and then  by 1 unit at a time some--- as directed at last visit. She does bring in blood sugar log form for me to review. October 1 ----36 units-----240 October 2----37 units------ 230 October 3--- 45 units------ 236 October 4----50 units-------184-- October 5----55 units-------- 176 October 6-----60 units------- 200 October 7------ 61 units-------- 179 October 8------ 62 units-------- 201 October 9--------- 65 units------- blood sugar this morning October 10 was 180  She states that I had given her a savings card to use for her insulin so she has $0 co-pay.   Past Medical History:  Diagnosis Date  . Allergy   . Anxiety 2013  . Asthma 2015  . Depression 2013  . Diabetes mellitus 2004  . GERD (gastroesophageal reflux disease) 2013  . Hyperlipidemia 1992  . Hypertension 1992  . Memory impairment      Home Meds: Outpatient Medications Prior to Visit  Medication Sig Dispense Refill  . aspirin 81 MG tablet Take 1 tablet (81 mg total) by mouth daily. 30 tablet 11  . blood glucose meter kit and supplies KIT Inject 1 each into the skin as directed. Dispense based on patient and insurance preference. Check blood sugar fasting each morning.  ICD E11.65 1 each 0  . Blood Glucose Monitoring Suppl (FREESTYLE LITE) DEVI UAD.E11.65  0  . [facility] D3 2000 units CAPS Take 2 capsules (4,000 Units total) by mouth daily. 30 each 3  . glucose blood test strip Dispense based on patient and insurance preference.  Check blood sugar fasting each morning.  ICD E11.65 50 each 12  . Insulin Glargine (LANTUS SOLOSTAR) 100 UNIT/ML Solostar Pen Administer daily as directed----up to 30 units daily (Patient taking differently: Inject 65 Units into the skin daily at 10 pm. ) 5 pen PRN  . Insulin Pen Needle 32G X 4 MM MISC Use with insulin qd 100 each 3  . Lancets MISC Dispense based on patient and insurance preference.  Check blood sugar fasting each morning.  ICD E11.65 100 each 3  . lisinopril-hydrochlorothiazide  (PRINZIDE,ZESTORETIC) 20-12.5 MG tablet TAKE 1 TABLET BY MOUTH DAILY. 90 tablet 3  . LORazepam (ATIVAN) 1 MG tablet TAKE 1 TABLET BY MOUTH TWICE A DAY 60 tablet 2  . metFORMIN (GLUCOPHAGE) 500 MG tablet Take 1 tablet (500 mg total) by mouth 2 (two) times daily with a meal. 60 tablet 3  . mirtazapine (REMERON) 30 MG tablet Take 1 tablet (30 mg total) by mouth at bedtime. 30 tablet 5  . omeprazole (PRILOSEC) 20 MG capsule TAKE ONE CAPSULE EVERY DAY 30 capsule 1  . rosuvastatin (CRESTOR) 40 MG tablet TAKE 1 TABLET (40 MG TOTAL) BY MOUTH DAILY. 30 tablet 4  . Nutritional Supplements (ESTROVEN PO) Take 1 tablet by mouth at bedtime. OTC     No facility-administered medications prior to visit.     Allergies:  Allergies  Allergen Reactions  . Contrast Media [Iodinated Diagnostic Agents] Anaphylaxis  . Ambien [Zolpidem Tartrate] Other (See Comments)    Family has told her that after taking Ambien, she does "crazy things" that she does not remember doing.   . Codeine Nausea And Vomiting    Social History   Social History  . Marital status: Married    Spouse name: N/A  . Number of children: N/A  . Years of education: N/A   Occupational History  . Not on file.   Social History Main Topics  . Smoking status: Never Smoker  . Smokeless tobacco: Never Used  . Alcohol use No  . Drug use: No  . Sexual activity: Yes   Other Topics Concern  . Not on file   Social History Narrative   Married.    Lives with husband and youngest son, who is 95 y/o.   Has 4 children.   Does not work outside of home.    No formal exercise. Only activity around the house.   Tries to be careful with Diabetic diet.    Family History  Problem Relation Age of Onset  . Cancer Father        Lymphoma, Leukemia  . Depression Father   . Cancer Maternal Grandfather   . Cancer Paternal Grandmother   . Diabetes Paternal Grandfather   . Kidney disease Paternal Grandfather      Review of Systems:  See HPI for  pertinent ROS. All other ROS negative.    Physical Exam: Blood pressure 124/70, pulse 68, temperature 97.9 F (36.6 C), temperature source Oral, resp. rate 14, height _0  (1.549 m), weight 107 kg (236 lb), SpO2 98 %., Body mass index is 44.59 kg/m. General: Obese WF. Appears in no acute distress. Neck: Supple. No thyromegaly. No lymphadenopathy. No carotid bruit.  Lungs: Clear bilaterally to auscultation without wheezes, rales, or rhonchi. Breathing is unlabored. Heart: RRR with S1 S2. No murmurs, rubs, or gallops. Abdomen: Soft, non-tender, non-distended with normoactive bowel sounds. No hepatomegaly. No rebound/guarding. No obvious abdominal masses. Musculoskeletal:  Strength and tone normal for age. Extremities/Skin: Warm and dry.  Neuro: Alert and oriented X 3. Moves all extremities spontaneously. Gait is normal. CNII-XII grossly in tact. Psych:  Responds to questions appropriately with a normal affect. Diabetic Foot Exam: Inspection is normal.  Sensation is normal and intact.  1+ bilateral posterior tibial pulses and dorsalis pedis pulses.      ASSESSMENT AND PLAN:  56 y.o. year old female with   01/12/2017: Uncontrolled diabetes mellitus type 2 without complications, unspecified whether long term insulin use (HCC)  Continue the metformin 500 mg twice a day. If abdominal/GI side effects worsen then call and notify me. Regarding her insulin dosing she is to do the following:  Increase by 5 units each night until her fasting blood sugars are consistently </= 180 Then increase by 1 unit each evening until fasting sugars get to around 100-150. I discussed this with her and she voices understanding. Discussed that we definitely do not want blood sugar to get too low so deftly do not want to give too much insulin. She voices understanding and agrees.  She is to have follow-up visit in one week. Follow-up sooner if needed. Call if has any questions or concerns about dosing.  She  states that I had given her a savings card to use for her insulin so she has $0 co-pay.    ----------------------------THE FOLLOWING IS COPIED FROM OV NOTE 11/04/2016------NOT ADDRESSED  AT OV 12/15/2016--------------------------------------   1. Type 2 diabetes mellitus with hyperglycemia  On ACE inhibitor On statin On ASA 81 mg daily  Micro albumin was done 11/05/2013, 12/25/2014, 04/29/2016  She reports that she does have annual diabetic eye exam -Diabetic Foot Exam is good  07/28/2016:  Check A1C, CMET---She is now on every class of meds except Inulin 11/03/2016--A1C  2. Hyperlipidemia She is taking Crestor. She has had elevated LFTs on one of the checks in the past but then they were normal at the most recent LFT check. LFTs norma lagain at check 03/2014 and 09/2014 03/2014 LDL at goal--at 73 09/2014 LDL was up significantly at 192. Discussed at follow-up visit 12/25/14 and patient states that she doesn't remember to take the Crestor daily. Discussed the variation in these lab results and the importance of taking the Crestor daily. She is agreeable to do so. At visit 04/02/15 she states that she is taking the Crestor daily. However she is not fasting today so we'll wait to repeat FLP. At visit 01/2016 she is fasting so FLP rechecked.  04/29/2016: FLP/LFTs performed 01/2016. Told her to make sure to stay on statin. Reviewed LDL reduction she has gottne with use of statin and importance of compliance with this. Cont current dose, current statin.  07/28/2016: She is fasting. She is on Crestor. Recheck FLP/LFT. 11/03/2016--she assures me that she is taking the Crestor daily. She had FLP/LFT at last visit 07/2016 6 can wait to recheck this.   4. Essential hypertension BP was slightly elevated at visit 10/2015 and is slightly elevated again 01/2016. At that visit lisinopril was increased from 10/12.5 up to 20/12.5. Last Potassium was good at 3.9. 04/29/2016: BP at goal. Cont current meds. BMET normal  01/2016.  07/28/2016: Blood Pressure at goal. Continue current medications. Check lab to monitor. 11/03/2016 blood pressure is at goal. Continue current medications. Lab was stable 07/2016 to monitor.  5. Vitamin D deficiency On labs 11/05/13 vitamin D was low at 23. Currently she is on vitamin D. I told her to start taking vitamin D over-the-counter 2000 units daily. Recheck Vitamin D at lab 09/23/2014--I am indeed very low. She was told to take prescription 50,000 units weekly for 12 weeks and when she completes this she is to take over-the-counter vitamin D 4000 units daily. At [place] 12/25/14 she states that she is taking vitamin D as directed and reminded her to take the over-the-counter supplements when she completes the prescription strength. She voices understanding and agrees. At [place] 04/02/15 she does not seem certain about what dose of vitamin D she is taking currently. We'll recheck vitamin D level now. 04/29/2016: Cont current dose Vit D 07/28/2016: Last vitamin D level was checked remotely ( > 1 year ago). Will recheck vitamin D level today. 11/03/16 she is to continue her vitamin D and make sure to take daily. Just checked level 4/18 6 can wait to recheck lab.  5. Asthma, mild intermittent, uncomplicated 07/06/8885: : This is stable and controlled.  6. Gastroesophageal reflux disease, esophagitis presence not specified 11/03/2016:  This is stable and controlled.  7. Anxiety She was on Zoloft 200 mg in past, prior to coming to our office.  At [place] 09/19/2013 discussed: Need to start this back at low dose at 50 mg for couple weeks. Then can increase to 100 mg and then slowly will titrate back up as needed.  At [place] 09/23/2014---- reviewed with patient that she has continued on the above medications since then. She  states that current medications are controlling her symptoms well. She is not feeling anxious or depressed with using Zoloft at 50 mg. Wants to continue current dose and current  treatment.  11/04/2016:  This remains stable and controlled on current medications.   8. Depression See # 7 above. 11/04/2016:  This remains stable and controlled on current medications.  9. Insomnia At visit 04/02/15 she reports that Ambien has been causing her to do crazy things during the night that she does not recall doing. Therefore she has stopped the Ambien. At visit 04/02/15 prescribed Remeron 30 mg QHS. Told her to follow-up if this causes adverse effects or is not effective. At visit 01/2016 she states that the Remeron is ineffective. At visit changed her to [place].  10. GERD (gastroesophageal reflux disease) 11/04/2016: Symptoms are controlled with omeprazole daily.  11. Asthma 11/04/2016: This is stable/controlled with current medication.   She had CPE with me 2015 At ROVs since then, I have tried to update preventive care--such as at one OV I ordered mammogram--she was agreeable--but then she did not go for this---will discuss scheduling another CPE at next OV  Routine follow-up office visit here in 3 months or follow-up sooner if needed.   [address] [place], [place], BSFM 01/12/2017 2:23 PM

## 2017-01-19 ENCOUNTER — Encounter: Payer: Self-pay | Admitting: Physician Assistant

## 2017-01-19 ENCOUNTER — Ambulatory Visit (INDEPENDENT_AMBULATORY_CARE_PROVIDER_SITE_OTHER): Payer: 59 | Admitting: Physician Assistant

## 2017-01-19 VITALS — BP 132/88 | HR 77 | Temp 97.7°F | Resp 14 | Wt 239.8 lb

## 2017-01-19 DIAGNOSIS — E1165 Type 2 diabetes mellitus with hyperglycemia: Secondary | ICD-10-CM | POA: Diagnosis not present

## 2017-01-19 DIAGNOSIS — IMO0001 Reserved for inherently not codable concepts without codable children: Secondary | ICD-10-CM

## 2017-01-19 NOTE — Progress Notes (Signed)
[name] MRN: [id number], DOB: 05/31/1960, 56 y.o. Date of Encounter: _0 @  Chief Complaint:  Chief Complaint  Patient presents with  . Follow-up    3 mos    HPI: 56 y.o. year old white female  Presents for follow-up office visit.   She saw me on 09/19/13 as a new patient to establish care. She said that she had been going to [facility] on [facility] in [place]. Said that she had been going there for 30 years. Alicia Pope that they actually dismissed her (but did not state the reason why).  Alicia Pope that her last office visit and last labs there were about 3 months prior to her visit here on 09/19/2013. She said that she went there for office visits every 3 months.  However said that she was out of all of her medications except for her diabetes and blood pressure medicines.  Stated that she was seeing no other medical providers except for that family practice. Has seen no gynecologist. Has had no mammogram or colonoscopy or other preventive care.  At that initial visit, she was not fasting so could not check lipid panel. At that visit we just checked CMET and A1c. CMET revealed elevated LFTs. Also showed A1c elevated at 8.9. At that time I said to stop the Crestor and also to find out if she was taking any Tylenol or drinking any alcohol.  At f/u OV 56/3/15  she said that she does not remember getting a message to stop the Crestor. Said that she was still taking Crestor. Said that she does not use any Tylenol and does not drink any type of alcohol. At the time of that lab result also said to increase metformin from 500 twice a day to 1000 mg twice a day. She said that she did make this change. Also said to add Actos 45 mg daily. She says that she did add this medication as well. As it turns out, when we rechecked LFTs on 11/05/13-- they were normal. This was on Crestor and we verified that she was on Crestor at the time of these LFTs were normal.  Therefore  we told her to just go ahead and stay on the Crestor and we will monitor LFTs. Today she reports that she is still on the Crestor.  She says that when she checks her blood sugar she always checks it fasting morning. Says that she checks it about once a week.  She did not bring in her blood sugar log with her today.  She had follow-up office visit with me on 11/05/2013 for complete physical exam.  Today she reports that she is taking the Crestor and has been on this this entire time. She is taking other medications as directed as well. Also verified that she is taking aspirin which was added at prior visit with me.  At 56/9/15 OV she reports that she only occasionally checks BS. Did not bring log.  I reveiwed last labs I did here. 03/11/14 A1C was 7.4---said to add Januvia 63m--she says she did add this and IS taking this daily.   At office visit 09/23/14 I reviewed the fact that at her initial visit with me she had told me that in the past, she had been on Zoloft 200 mg but at the time of that initial visit with me she was off Zoloft completely but was needing to resume it. At that time we started Zoloft at 50 mg with plans  to gradually titrate up the dose. However, thus far the 50 mg dose has been continued. Discussed this with her today and she states that the 50 mg dose seems to be working well for her and she does not feel that she needs to increase the dose. Is controlling her symptoms well as causing no adverse effects.  At visit 09/23/14 she does not bring in any type of blood sugar log or blood sugar reading reports. States that she is taking medications as directed. She has been educated regarding proper diet and exercise but is noncompliant with this.  09/23/2014 lab results showed  --LDL had increased significantly. Past labs that showed LDL 64 and 73 the current LDL at that time was 192. At f/u OV 12/25/14-- she states that "sometimes forgets to take Crestor ". At [place] 12/25/14-- I discussed  the above lab results and the fact that cholesterol is severely elevated without medication but well controlled with medication and the necessity for her to take Crestor on a daily basis. She voices understanding and agrees. ---Also at lab vitamin D was very low and she was told to take prescription strength weekly for 12 weeks then take 4000 units daily. At [place] 12/25/14--States that she is taking this as directed and I reminded her about using the 4000 units daily when she completes the prescription. --Also at lab A1c was 8.0. For her to continue all current medications and also add Glucotrol XL 10 mg daily. 12/25/14-- she states that she is taking this as directed.  At [place] --12/25/2014--- she did bring in paperwork she has been writing sugar readings.  States that she only checks fasting morning readings. She has the following readings: She has 36 readings----none of these are in the 90s. 3 of these are in the 100s. 5 of these are in the 110s. 5 are these are in the 120s.  She has a few readings less than 90--- one reading that's an 24, one that is 69, 1 that is 82, 1 that is 88. She has 4 readings that are higher at 136, 167, 172, 165  At OV 04/02/15: She states that she has stopped Ambien. Says that her family told her that she was doing "crazy things "at night after taking that medicine. States that she does not remember doing and has no recollection of those events. Patient states that she is taking her Crestor now. However she is not fasting today. Says that she drank a "root beer" this morning and had forgotten about needing to be fasting. Also asked about what dose of vitamin D she is currently taking. Reviewed that at lab 09/23/14 she was to do the 50,000 units weekly for 12 weeks then 4000 units daily. She does not seem certain about exactly what she is taking but says that it is prescription. She did not bring in any blood sugars today for me to review. She states that she is taking all medicines  as directed and having no adverse effects other than what is stated about Ambien above.  10/16/2015: Reviewed that at last office visit-- 07/10/15-- A1c was checked and came back 8.8. At that time-- recommended to start Invokana 300 mg daily. Also told patient that if A1c not at goal at recheck with have to add insulin. Told her to be very strict with her carb intake and addition to taking all of the medications. Today she states that she has been taking the Invokana daily. Also reviewed that at the time of her  last lipid panel June 2016 LDL came back 192 and at that time she reported that she had not been taking her Crestor every day. Followed up that issue at other OVs since then. Followed it up again today and she states that she is taking the Crestor every day. She is taking all medications as directed. However did review that her blood pressure is high today. She says that she usually takes her medicines in the morning but because she did not eat much today she was going to wait and take medicines with her dinner tonight. Discussed need to take medications at same time every day. As well when we further discussed whether she was fasting-- she is not. So, cannot check FLP.  01/19/2016: She has no complaints or concerns today. She is taking blood pressure medications as directed. No lightheadedness. Initially she had no complaints but then once I mention her blood pressure being somewhat high again today, as it was at last visit, she says that she has not been getting much sleep and asked if that could be affecting her high blood pressure. Says that the Remeron has no effect. She takes it but doesn't notice any difference. Says that she is getting very little sleep. I asked if she is napping during the day but she says no she does not nap. Says that she checks her blood sugar a couple of times a week fasting morning and gets usually around 130 - 140 - 150. However says that this morning it was  230. She is taking diabetic medicines as directed with no adverse effects. She is fasting today. She is taking cholesterol medication as directed with no adverse effects. Current dose of Zoloft is still working in controlling her anxiety and depression. Causing no adverse effects.  04/29/2016: Says she checks her BS most mornings. Gets ~ 150. She is taking all diabetic meds as directed. No adv effects.  She is taking statin as directed. No myalgias or other adv effects.  She is taking Zoloft daily. This is controlling her anxiety and depression. Is causing no adv effects.   07/28/2016: Reviewed that at lab 04/29/16-- A1c came back at 8.6 and had her add Trulicity. Today she reports that she did add the Trulicity and gives this every Monday. Says that she is having no adverse effects.  Says that she checks fasting blood sugar most mornings and gets around 160. She has no concerns to address today. She is taking all diabetic meds as directed. No adv effects.  She is taking statin as directed. No myalgias or other adv effects.  She is taking Zoloft daily. This is controlling her anxiety and depression. Is causing no adv effects.    11/04/2016: She reports that she checks fasting morning blood sugar about 2-3 times per week. Gets readings of "around 127 or 147 somewhere in that area." Noted at that at the last set of labs I had stated to make sure she was taking her Crestor and vitamin D daily and reviewed this again with her today. She reassures me that she is taking the Crestor (and vitamin D) on a daily basis and not skipping on this. She has no concerns to address today. She is taking all diabetic meds as directed. No adv effects.  She is taking statin as directed. No myalgias or other adv effects.  She is taking Zoloft daily. This is controlling her anxiety and depression. Is causing no adv effects.    12/15/2016: At last office visit  11/04/16 A1c came back at 7.8.  At that time I noted that  she was on maximum dose of 5 different types of diabetic medications.  A1c still not controlled.  Recommended for her to come in for follow-up visit to discuss medication change/insulin. Patient now presents for that follow-up visit.  A/P AT THAT OV-----12/15/2016: Uncontrolled diabetes mellitus type 2 without complications, unspecified whether long term insulin use ([place]) At this time she is to stop all of the following medications:  Invokana, Trulicity, Glucotrol,  Januvia, Actos.  She reports that she had stopped the metformin secondary to nausea and GI upset. Told her that we will restart metformin at low dose. She will take 500 mg once daily for several days. If she is having no nausea or other GI side effects, then she can go up to taking this twice daily.  She will administer the Lantus as follows: She will give 10 units tonight. She will give 15 units tomorrow night (tomorrow will take no oral medication except for metformin 500 mg) Then she will increase Lantus by 1 unit each night until fasting morning sugars are in the 100-150 range.  She is to document fasting morning sugars every morning and bring this to her next OV. Today I gave her a blood sugar log form and showed her how to fill this out--- she is to document the number of units of insulin given each evening and document fasting morning sugars each morning. She will schedule follow-up visit with me in approximately 10 days. I have given her savings card for Lantus and she should have $0 co-pay. If she gets home and has any questions or concerns, she can call me at any time in the interim. Otherwise follow-up visit approximate 10 days.  Note--her son has Type 1 Diabetes and pt has been on Trulicity so she has understanding of adjusting Insulin and she has experience with using pen.  - Insulin Glargine (LANTUS SOLOSTAR) 100 UNIT/ML Solostar Pen; Administer daily as directed----up to 30 units daily  Dispense: 5 pen; Refill:  PRN - metFORMIN (GLUCOPHAGE) 500 MG tablet; Take 1 tablet (500 mg total) by mouth 2 (two) times daily with a meal.  Dispense: 60 tablet; Refill: 3     01/05/2017: Today she reports that she is taking the metformin 1 by mouth twice a day. Says that it is causing a little bit of abdominal discomfort but not much.  Today she does bring her blood sugar log form and has been filling this incorrectly. She did give the 10 units on that first night after last visit and then did increase to the 15 units the following night. She has been increasing by 1 unit each night since then. However her fasting blood sugars are not coming down much.  She is now up to giving 37 units of insulin last night.  Fasting morning blood sugars have been as follows ever since her last visit daily morning sugars have been: 173, 210, 155, 196, 180, 190, 228, 225, 202, 200, 210, 234, 200, 210, 225, 241, 220, 221, 194, 240, 230, 236  A/P AT OV 01/05/2017: Uncontrolled diabetes mellitus type 2 without complications, unspecified whether long term insulin use ([facility])  Continue the metformin 500 mg twice a day. If abdominal/GI side effects worsen then call and notify me. Regarding her insulin dosing she is to do the following: Tonight give 45 units. Then increase by 5 units each night until her fasting blood sugars get down to around 180 Then  increase by 1 unit each evening until fasting sugars get to around 100-150. I discussed this with her and she voices understanding. Skin is that we definitely do not want blood sugar to get too low so deftly do not want to give too much insulin. She voices understanding and agrees.  She is to have follow-up visit in one week. Follow-up sooner if needed. Call if has any questions or concerns about dosing.   01/12/2017: Today she states that she is still tolerating the metformin 500 mg twice a day. Today she states that she did increase the insulin by 5 units at a time some nights and then  by 1 unit at a time some--- as directed at last visit. She does bring in blood sugar log form for me to review. October 1 ----36 units-----240 October 2----37 units------ 230 October 3--- 45 units------ 236 October 4----50 units-------184-- October 5----55 units-------- 176 October 6-----60 units------- 200 October 7------ 61 units-------- 179 October 8------ 62 units-------- 201 October 9--------- 65 units------- blood sugar this morning October 10 was 180  She states that I had given her a savings card to use for her insulin so she has $0 co-pay.   01/19/2017:  Today she brings in blood sugar log form. Has the following information documented. Says that the insulin pen will only go to 80 units--- says it will not doll beyond 80. Therefore has stopped at the 80 units.  October 10----75 units----the following morning blood sugar 191 October 11-----75 units----------------------------------------------- 168 October 12--------------- 75 units-------------------------------------- 176 October 13-----------75 units----------------------------------- 188 October 14----------- 75 units------------------------------ 195 October 15-----------75 units------------------------------186 October 16-------- 80 units-------------------------------------- 188      Past Medical History:  Diagnosis Date  . Allergy   . Anxiety 2013  . Asthma 2015  . Depression 2013  . Diabetes mellitus 2004  . GERD (gastroesophageal reflux disease) 2013  . Hyperlipidemia 1992  . Hypertension 1992  . Memory impairment      Home Meds: Outpatient Medications Prior to Visit  Medication Sig Dispense Refill  . aspirin 81 MG tablet Take 1 tablet (81 mg total) by mouth daily. 30 tablet 11  . blood glucose meter kit and supplies KIT Inject 1 each into the skin as directed. Dispense based on patient and insurance preference. Check blood sugar fasting each morning.  ICD E11.65 1 each 0  . Blood Glucose Monitoring  Suppl (FREESTYLE LITE) DEVI UAD.E11.65  0  . [facility] D3 2000 units CAPS Take 2 capsules (4,000 Units total) by mouth daily. 30 each 3  . glucose blood test strip Dispense based on patient and insurance preference.  Check blood sugar fasting each morning.  ICD E11.65 50 each 12  . Insulin Glargine (LANTUS SOLOSTAR) 100 UNIT/ML Solostar Pen Administer daily as directed----up to 30 units daily (Patient taking differently: Inject 65 Units into the skin daily at 10 pm. ) 5 pen PRN  . Insulin Pen Needle 32G X 4 MM MISC Use with insulin qd 100 each 3  . Lancets MISC Dispense based on patient and insurance preference.  Check blood sugar fasting each morning.  ICD E11.65 100 each 3  . lisinopril-hydrochlorothiazide (PRINZIDE,ZESTORETIC) 20-12.5 MG tablet TAKE 1 TABLET BY MOUTH DAILY. 90 tablet 3  . LORazepam (ATIVAN) 1 MG tablet TAKE 1 TABLET BY MOUTH TWICE A DAY 60 tablet 2  . metFORMIN (GLUCOPHAGE) 500 MG tablet Take 1 tablet (500 mg total) by mouth 2 (two) times daily with a meal. 60 tablet 3  . mirtazapine (REMERON) 30  MG tablet Take 1 tablet (30 mg total) by mouth at bedtime. 30 tablet 5  . omeprazole (PRILOSEC) 20 MG capsule TAKE ONE CAPSULE EVERY DAY 30 capsule 1  . rosuvastatin (CRESTOR) 40 MG tablet TAKE 1 TABLET (40 MG TOTAL) BY MOUTH DAILY. 30 tablet 4   No facility-administered medications prior to visit.     Allergies:  Allergies  Allergen Reactions  . Contrast Media [Iodinated Diagnostic Agents] Anaphylaxis  . Ambien [Zolpidem Tartrate] Other (See Comments)    Family has told her that after taking Ambien, she does "crazy things" that she does not remember doing.   . Codeine Nausea And Vomiting    Social History   Social History  . Marital status: Married    Spouse name: N/A  . Number of children: N/A  . Years of education: N/A   Occupational History  . Not on file.   Social History Main Topics  . Smoking status: Never Smoker  . Smokeless tobacco: Never Used  . Alcohol use No    . Drug use: No  . Sexual activity: Yes   Other Topics Concern  . Not on file   Social History Narrative   Married.    Lives with husband and youngest son, who is 35 y/o.   Has 4 children.   Does not work outside of home.    No formal exercise. Only activity around the house.   Tries to be careful with Diabetic diet.    Family History  Problem Relation Age of Onset  . Cancer Father        Lymphoma, Leukemia  . Depression Father   . Cancer Maternal Grandfather   . Cancer Paternal Grandmother   . Diabetes Paternal Grandfather   . Kidney disease Paternal Grandfather      Review of Systems:  See HPI for pertinent ROS. All other ROS negative.    Physical Exam: Blood pressure 132/88, pulse 77, temperature 97.7 F (36.5 C), temperature source Oral, resp. rate 14, weight 108.8 kg (239 lb 12.8 oz), SpO2 97 %., Body mass index is 45.31 kg/m. General: Obese WF. Appears in no acute distress. Neck: Supple. No thyromegaly. No lymphadenopathy. No carotid bruit.  Lungs: Clear bilaterally to auscultation without wheezes, rales, or rhonchi. Breathing is unlabored. Heart: RRR with S1 S2. No murmurs, rubs, or gallops. Abdomen: Soft, non-tender, non-distended with normoactive bowel sounds. No hepatomegaly. No rebound/guarding. No obvious abdominal masses. Musculoskeletal:  Strength and tone normal for age. Extremities/Skin: Warm and dry.  Neuro: Alert and oriented X 3. Moves all extremities spontaneously. Gait is normal. CNII-XII grossly in tact. Psych:  Responds to questions appropriately with a normal affect. Diabetic Foot Exam: Inspection is normal.  Sensation is normal and intact.  1+ bilateral posterior tibial pulses and dorsalis pedis pulses.      ASSESSMENT AND PLAN:  56 y.o. year old female with   01/19/2017: Uncontrolled diabetes mellitus type 2 without complications, unspecified whether long term insulin use ([facility])  Continue the metformin 500 mg twice a day. If abdominal/GI  side effects worsen then call and notify me. Regarding her insulin dosing she is to do the following:  Increase by 5 units each night until her fasting blood sugars are consistently </= 180. She reports that the Insulin Pen will not dial beyond 80 units. She is to give 2 injections so she can give 80 then an additional 5 units and so forth--so she can titrate beyond 80 units--until BS controlled.  Then increase by 1  unit each evening until fasting sugars get to around 100-150. I discussed this with her and she voices understanding. Discussed that we definitely do not want blood sugar to get too low so deftly do not want to give too much insulin. She voices understanding and agrees.  I also discussed with her that these BS readings really make no sense.  Doesn't make sense that she was getting similar blood sugar readings back when she started the insulin and was only administering around 15 units at night-- was getting the same readings as she is getting now at 80 units per night. Verified that she did stop all oral mes except for the metformin when we initiated the insulin and she says that she did. Her son accompanies her for visit. He has type 1 diabetes. He has his own blood r at home. She is to ge and check her blood sugar using her meter and then check it using his meter and see if she is getting similar readings. Neither of them hatheir meters hereth them at th so I cannotcheck accuracy of meter myself. I told her to go directly home check blood sugar with her meter and his meter and call me with those values.  It is now more than 2 hours after her visit and I have not gotten any message from her. Therefore I called her. However she states that she did call here and has left a message. Asked her what kind of reading she had gotten. She states that her meter read 205 and that her son's meter read in the 170s. She states that her meter does have a battery. I recommend that she change battery,  put in new battery. Then recheck blood sugar with her meter and son's meter both and compare and if they are still giving different readings let us know and we will send in Rx for new meter.  She is to have follow-up visit in one week. Follow-up sooner if needed. Call if has any questions or concerns about dosing.  She states that I had given her a savings card to use for her insulin so she has $0 co-pay.    ----------------------------THE FOLLOWING IS COPIED FROM OV NOTE 11/04/2016------NOT ADDRESSED AT OV 12/15/2016, 01/2017, 01/19/2017--------------------------------------   1. Type 2 diabetes mellitus with hyperglycemia  On ACE inhibitor On statin On ASA 81 mg daily  Micro albumin was done 11/05/2013, 12/25/2014, 04/29/2016  She reports that she does have annual diabetic eye exam -Diabetic Foot Exam is good  07/28/2016:  Check A1C, CMET---She is now on every class of meds except Inulin 11/03/2016--A1C  2. Hyperlipidemia She is taking Crestor. She has had elevated LFTs on one of the checks in the past but then they were normal at the most recent LFT check. LFTs norma lagain at check 03/2014 and 09/2014 03/2014 LDL at goal--at 73 09/2014 LDL was up significantly at 192. Discussed at follow-up visit 12/25/14 and patient states that she doesn't remember to take the Crestor daily. Discussed the variation in these lab results and the importance of taking the Crestor daily. She is agreeable to do so. At visit 04/02/15 she states that she is taking the Crestor daily. However she is not fasting today so we'll wait to repeat FLP. At visit 01/2016 she is fasting so FLP rechecked.  04/29/2016: FLP/LFTs performed 01/2016. Told her to make sure to stay on statin. Reviewed LDL reduction she has gottne with use of statin and importance of compliance with this. Cont current  dose, current statin.  07/28/2016: She is fasting. She is on Crestor. Recheck FLP/LFT. 11/03/2016--she assures me that she is taking the  Crestor daily. She had FLP/LFT at last visit 07/2016 6 can wait to recheck this.   4. Essential hypertension BP was slightly elevated at visit 10/2015 and is slightly elevated again 01/2016. At that visit lisinopril was increased from 10/12.5 up to 20/12.5. Last Potassium was good at 3.9. 04/29/2016: BP at goal. Cont current meds. BMET normal 01/2016.  07/28/2016: Blood Pressure at goal. Continue current medications. Check lab to monitor. 11/03/2016 blood pressure is at goal. Continue current medications. Lab was stable 07/2016 to monitor.  5. Vitamin D deficiency On labs 11/05/13 vitamin D was low at 23. Currently she is on vitamin D. I told her to start taking vitamin D over-the-counter 2000 units daily. Recheck Vitamin D at lab 09/23/2014--I am indeed very low. She was told to take prescription 50,000 units weekly for 12 weeks and when she completes this she is to take over-the-counter vitamin D 4000 units daily. At [place] 12/25/14 she states that she is taking vitamin D as directed and reminded her to take the over-the-counter supplements when she completes the prescription strength. She voices understanding and agrees. At [place] 04/02/15 she does not seem certain about what dose of vitamin D she is taking currently. We'll recheck vitamin D level now. 04/29/2016: Cont current dose Vit D 07/28/2016: Last vitamin D level was checked remotely ( > 1 year ago). Will recheck vitamin D level today. 11/03/16 she is to continue her vitamin D and make sure to take daily. Just checked level 4/18 6 can wait to recheck lab.  5. Asthma, mild intermittent, uncomplicated 04/08/4313: : This is stable and controlled.  6. Gastroesophageal reflux disease, esophagitis presence not specified 11/03/2016:  This is stable and controlled.  7. Anxiety She was on Zoloft 200 mg in past, prior to coming to our office.  At [place] 09/19/2013 discussed: Need to start this back at low dose at 50 mg for couple weeks. Then can increase to 100 mg and  then slowly will titrate back up as needed.  At [place] 09/23/2014---- reviewed with patient that she has continued on the above medications since then. She states that current medications are controlling her symptoms well. She is not feeling anxious or depressed with using Zoloft at 50 mg. Wants to continue current dose and current treatment.  11/04/2016:  This remains stable and controlled on current medications.   8. Depression See # 7 above. 11/04/2016:  This remains stable and controlled on current medications.  9. Insomnia At visit 04/02/15 she reports that Ambien has been causing her to do crazy things during the night that she does not recall doing. Therefore she has stopped the Ambien. At visit 04/02/15 prescribed Remeron 30 mg QHS. Told her to follow-up if this causes adverse effects or is not effective. At visit 01/2016 she states that the Remeron is ineffective. At visit changed her to [place].  10. GERD (gastroesophageal reflux disease) 11/04/2016: Symptoms are controlled with omeprazole daily.  11. Asthma 11/04/2016: This is stable/controlled with current medication.   She had CPE with me 2015 At [facility] since then, I have tried to update preventive care--such as at one OV I ordered mammogram--she was agreeable--but then she did not go for this---will discuss scheduling another CPE at next OV  Routine follow-up office visit here in 3 months or follow-up sooner if needed.   Signed, Olean Ree As[place], [place], [place]  01/19/2017 2:11 PM

## 2017-01-20 ENCOUNTER — Telehealth: Payer: Self-pay

## 2017-01-20 NOTE — Telephone Encounter (Signed)
Patient called and left a message and states she was told to check her meter when she got home. Patient states her meter is about 30 points off compared to her sons. Pls advise

## 2017-01-20 NOTE — Telephone Encounter (Signed)
I had already called patient to follow-up about this yesterday and already added addendum to my office note with this information. I have already followed up on this.  (Tiffany)--You do not need to do anything additional regarding this.

## 2017-01-26 ENCOUNTER — Ambulatory Visit: Payer: 59 | Admitting: Physician Assistant

## 2017-02-03 ENCOUNTER — Ambulatory Visit (INDEPENDENT_AMBULATORY_CARE_PROVIDER_SITE_OTHER): Payer: 59 | Admitting: Physician Assistant

## 2017-02-03 ENCOUNTER — Encounter: Payer: Self-pay | Admitting: Family Medicine

## 2017-02-03 ENCOUNTER — Encounter: Payer: Self-pay | Admitting: Physician Assistant

## 2017-02-03 VITALS — BP 130/82 | HR 82 | Temp 97.4°F | Resp 18 | Ht 61.0 in | Wt 236.2 lb

## 2017-02-03 DIAGNOSIS — E785 Hyperlipidemia, unspecified: Secondary | ICD-10-CM

## 2017-02-03 DIAGNOSIS — I10 Essential (primary) hypertension: Secondary | ICD-10-CM

## 2017-02-03 DIAGNOSIS — E559 Vitamin D deficiency, unspecified: Secondary | ICD-10-CM | POA: Diagnosis not present

## 2017-02-03 DIAGNOSIS — F419 Anxiety disorder, unspecified: Secondary | ICD-10-CM | POA: Diagnosis not present

## 2017-02-03 DIAGNOSIS — Z1231 Encounter for screening mammogram for malignant neoplasm of breast: Secondary | ICD-10-CM | POA: Diagnosis not present

## 2017-02-03 DIAGNOSIS — Z1159 Encounter for screening for other viral diseases: Secondary | ICD-10-CM | POA: Diagnosis not present

## 2017-02-03 DIAGNOSIS — Z114 Encounter for screening for human immunodeficiency virus [HIV]: Secondary | ICD-10-CM | POA: Diagnosis not present

## 2017-02-03 DIAGNOSIS — E1165 Type 2 diabetes mellitus with hyperglycemia: Secondary | ICD-10-CM

## 2017-02-03 DIAGNOSIS — Z1239 Encounter for other screening for malignant neoplasm of breast: Secondary | ICD-10-CM

## 2017-02-03 DIAGNOSIS — IMO0001 Reserved for inherently not codable concepts without codable children: Secondary | ICD-10-CM

## 2017-02-03 NOTE — Progress Notes (Signed)
[name] MRN: [id number], DOB: 05/31/1960, 56 y.o. Date of Encounter: _0 @  Chief Complaint:  Chief Complaint  Patient presents with  . Follow-up    3 mos    HPI: 56 y.o. year old white female  Presents for follow-up office visit.   She saw me on 09/19/13 as a new patient to establish care. She said that she had been going to [facility] on [facility] in [place]. Said that she had been going there for 30 years. Michela Pitcher that they actually dismissed her (but did not state the reason why).  Michela Pitcher that her last office visit and last labs there were about 3 months prior to her visit here on 09/19/2013. She said that she went there for office visits every 3 months.  However said that she was out of all of her medications except for her diabetes and blood pressure medicines.  Stated that she was seeing no other medical providers except for that family practice. Has seen no gynecologist. Has had no mammogram or colonoscopy or other preventive care.  At that initial visit, she was not fasting so could not check lipid panel. At that visit we just checked CMET and A1c. CMET revealed elevated LFTs. Also showed A1c elevated at 8.9. At that time I said to stop the Crestor and also to find out if she was taking any Tylenol or drinking any alcohol.  At f/u OV 11/05/13  she said that she does not remember getting a message to stop the Crestor. Said that she was still taking Crestor. Said that she does not use any Tylenol and does not drink any type of alcohol. At the time of that lab result also said to increase metformin from 500 twice a day to 1000 mg twice a day. She said that she did make this change. Also said to add Actos 45 mg daily. She says that she did add this medication as well. As it turns out, when we rechecked LFTs on 11/05/13-- they were normal. This was on Crestor and we verified that she was on Crestor at the time of these LFTs were normal.  Therefore  we told her to just go ahead and stay on the Crestor and we will monitor LFTs. Today she reports that she is still on the Crestor.  She says that when she checks her blood sugar she always checks it fasting morning. Says that she checks it about once a week.  She did not bring in her blood sugar log with her today.  She had follow-up office visit with me on 11/05/2013 for complete physical exam.  Today she reports that she is taking the Crestor and has been on this this entire time. She is taking other medications as directed as well. Also verified that she is taking aspirin which was added at prior visit with me.  At 06/11/13 OV she reports that she only occasionally checks BS. Did not bring log.  I reveiwed last labs I did here. 03/11/14 A1C was 7.4---said to add Januvia 63m--she says she did add this and IS taking this daily.   At office visit 09/23/14 I reviewed the fact that at her initial visit with me she had told me that in the past, she had been on Zoloft 200 mg but at the time of that initial visit with me she was off Zoloft completely but was needing to resume it. At that time we started Zoloft at 50 mg with plans  to gradually titrate up the dose. However, thus far the 50 mg dose has been continued. Discussed this with her today and she states that the 50 mg dose seems to be working well for her and she does not feel that she needs to increase the dose. Is controlling her symptoms well as causing no adverse effects.  At visit 09/23/14 she does not bring in any type of blood sugar log or blood sugar reading reports. States that she is taking medications as directed. She has been educated regarding proper diet and exercise but is noncompliant with this.  09/23/2014 lab results showed  --LDL had increased significantly. Past labs that showed LDL 64 and 73 the current LDL at that time was 192. At f/u OV 12/25/14-- she states that "sometimes forgets to take Crestor ". At [place] 12/25/14-- I discussed  the above lab results and the fact that cholesterol is severely elevated without medication but well controlled with medication and the necessity for her to take Crestor on a daily basis. She voices understanding and agrees. ---Also at lab vitamin D was very low and she was told to take prescription strength weekly for 12 weeks then take 4000 units daily. At [place] 12/25/14--States that she is taking this as directed and I reminded her about using the 4000 units daily when she completes the prescription. --Also at lab A1c was 8.0. For her to continue all current medications and also add Glucotrol XL 10 mg daily. 12/25/14-- she states that she is taking this as directed.  At [place] --12/25/2014--- she did bring in paperwork she has been writing sugar readings.  States that she only checks fasting morning readings. She has the following readings: She has 36 readings----none of these are in the 90s. 3 of these are in the 100s. 5 of these are in the 110s. 5 are these are in the 120s.  She has a few readings less than 90--- one reading that's an 24, one that is 69, 1 that is 82, 1 that is 88. She has 4 readings that are higher at 136, 167, 172, 165  At OV 04/02/15: She states that she has stopped Ambien. Says that her family told her that she was doing "crazy things "at night after taking that medicine. States that she does not remember doing and has no recollection of those events. Patient states that she is taking her Crestor now. However she is not fasting today. Says that she drank a "root beer" this morning and had forgotten about needing to be fasting. Also asked about what dose of vitamin D she is currently taking. Reviewed that at lab 09/23/14 she was to do the 50,000 units weekly for 12 weeks then 4000 units daily. She does not seem certain about exactly what she is taking but says that it is prescription. She did not bring in any blood sugars today for me to review. She states that she is taking all medicines  as directed and having no adverse effects other than what is stated about Ambien above.  10/16/2015: Reviewed that at last office visit-- 07/10/15-- A1c was checked and came back 8.8. At that time-- recommended to start Invokana 300 mg daily. Also told patient that if A1c not at goal at recheck with have to add insulin. Told her to be very strict with her carb intake and addition to taking all of the medications. Today she states that she has been taking the Invokana daily. Also reviewed that at the time of her  last lipid panel June 2016 LDL came back 192 and at that time she reported that she had not been taking her Crestor every day. Followed up that issue at other OVs since then. Followed it up again today and she states that she is taking the Crestor every day. She is taking all medications as directed. However did review that her blood pressure is high today. She says that she usually takes her medicines in the morning but because she did not eat much today she was going to wait and take medicines with her dinner tonight. Discussed need to take medications at same time every day. As well when we further discussed whether she was fasting-- she is not. So, cannot check FLP.  01/19/2016: She has no complaints or concerns today. She is taking blood pressure medications as directed. No lightheadedness. Initially she had no complaints but then once I mention her blood pressure being somewhat high again today, as it was at last visit, she says that she has not been getting much sleep and asked if that could be affecting her high blood pressure. Says that the Remeron has no effect. She takes it but doesn't notice any difference. Says that she is getting very little sleep. I asked if she is napping during the day but she says no she does not nap. Says that she checks her blood sugar a couple of times a week fasting morning and gets usually around 130 - 140 - 150. However says that this morning it was  230. She is taking diabetic medicines as directed with no adverse effects. She is fasting today. She is taking cholesterol medication as directed with no adverse effects. Current dose of Zoloft is still working in controlling her anxiety and depression. Causing no adverse effects.  04/29/2016: Says she checks her BS most mornings. Gets ~ 150. She is taking all diabetic meds as directed. No adv effects.  She is taking statin as directed. No myalgias or other adv effects.  She is taking Zoloft daily. This is controlling her anxiety and depression. Is causing no adv effects.   07/28/2016: Reviewed that at lab 04/29/16-- A1c came back at 8.6 and had her add Trulicity. Today she reports that she did add the Trulicity and gives this every Monday. Says that she is having no adverse effects.  Says that she checks fasting blood sugar most mornings and gets around 160. She has no concerns to address today. She is taking all diabetic meds as directed. No adv effects.  She is taking statin as directed. No myalgias or other adv effects.  She is taking Zoloft daily. This is controlling her anxiety and depression. Is causing no adv effects.    11/04/2016: She reports that she checks fasting morning blood sugar about 2-3 times per week. Gets readings of "around 127 or 147 somewhere in that area." Noted at that at the last set of labs I had stated to make sure she was taking her Crestor and vitamin D daily and reviewed this again with her today. She reassures me that she is taking the Crestor (and vitamin D) on a daily basis and not skipping on this. She has no concerns to address today. She is taking all diabetic meds as directed. No adv effects.  She is taking statin as directed. No myalgias or other adv effects.  She is taking Zoloft daily. This is controlling her anxiety and depression. Is causing no adv effects.    12/15/2016: At last office visit  11/04/16 A1c came back at 7.8.  At that time I noted that  she was on maximum dose of 5 different types of diabetic medications.  A1c still not controlled.  Recommended for her to come in for follow-up visit to discuss medication change/insulin. Patient now presents for that follow-up visit.  A/P AT THAT OV-----12/15/2016: Uncontrolled diabetes mellitus type 2 without complications, unspecified whether long term insulin use ([place]) At this time she is to stop all of the following medications:  Invokana, Trulicity, Glucotrol,  Januvia, Actos.  She reports that she had stopped the metformin secondary to nausea and GI upset. Told her that we will restart metformin at low dose. She will take 500 mg once daily for several days. If she is having no nausea or other GI side effects, then she can go up to taking this twice daily.  She will administer the Lantus as follows: She will give 10 units tonight. She will give 15 units tomorrow night (tomorrow will take no oral medication except for metformin 500 mg) Then she will increase Lantus by 1 unit each night until fasting morning sugars are in the 100-150 range.  She is to document fasting morning sugars every morning and bring this to her next OV. Today I gave her a blood sugar log form and showed her how to fill this out--- she is to document the number of units of insulin given each evening and document fasting morning sugars each morning. She will schedule follow-up visit with me in approximately 10 days. I have given her savings card for Lantus and she should have $0 co-pay. If she gets home and has any questions or concerns, she can call me at any time in the interim. Otherwise follow-up visit approximate 10 days.  Note--her son has Type 1 Diabetes and pt has been on Trulicity so she has understanding of adjusting Insulin and she has experience with using pen.  - Insulin Glargine (LANTUS SOLOSTAR) 100 UNIT/ML Solostar Pen; Administer daily as directed----up to 30 units daily  Dispense: 5 pen; Refill:  PRN - metFORMIN (GLUCOPHAGE) 500 MG tablet; Take 1 tablet (500 mg total) by mouth 2 (two) times daily with a meal.  Dispense: 60 tablet; Refill: 3     01/05/2017: Today she reports that she is taking the metformin 1 by mouth twice a day. Says that it is causing a little bit of abdominal discomfort but not much.  Today she does bring her blood sugar log form and has been filling this incorrectly. She did give the 10 units on that first night after last visit and then did increase to the 15 units the following night. She has been increasing by 1 unit each night since then. However her fasting blood sugars are not coming down much.  She is now up to giving 37 units of insulin last night.  Fasting morning blood sugars have been as follows ever since her last visit daily morning sugars have been: 173, 210, 155, 196, 180, 190, 228, 225, 202, 200, 210, 234, 200, 210, 225, 241, 220, 221, 194, 240, 230, 236  A/P AT OV 01/05/2017: Uncontrolled diabetes mellitus type 2 without complications, unspecified whether long term insulin use ([facility])  Continue the metformin 500 mg twice a day. If abdominal/GI side effects worsen then call and notify me. Regarding her insulin dosing she is to do the following: Tonight give 45 units. Then increase by 5 units each night until her fasting blood sugars get down to around 180 Then  increase by 1 unit each evening until fasting sugars get to around 100-150. I discussed this with her and she voices understanding. Skin is that we definitely do not want blood sugar to get too low so deftly do not want to give too much insulin. She voices understanding and agrees.  She is to have follow-up visit in one week. Follow-up sooner if needed. Call if has any questions or concerns about dosing.   01/12/2017: Today she states that she is still tolerating the metformin 500 mg twice a day. Today she states that she did increase the insulin by 5 units at a time some nights and then  by 1 unit at a time some--- as directed at last visit. She does bring in blood sugar log form for me to review. October 1 ----36 units-----240 October 2----37 units------ 230 October 3--- 45 units------ 236 October 4----50 units-------184-- October 5----55 units-------- 176 October 6-----60 units------- 200 October 7------ 61 units-------- 179 October 8------ 62 units-------- 201 October 9--------- 65 units------- blood sugar this morning October 10 was 180  She states that I had given her a savings card to use for her insulin so she has $0 co-pay.   01/19/2017:  Today she brings in blood sugar log form. Has the following information documented. Says that the insulin pen will only go to 80 units--- says it will not doll beyond 80. Therefore has stopped at the 80 units.  October 10----75 units----the following morning blood sugar 191 October 11-----75 units----------------------------------------------- 168 October 12--------------- 75 units-------------------------------------- 176 October 13-----------75 units----------------------------------- 188 October 14----------- 75 units------------------------------ 195 October 15-----------75 units------------------------------186 October 16-------- 80 units-------------------------------------- 188   02/03/2017:  Today she does bring in her blood sugar log form. October 17-----85 units------------------- 187 October 18------ 90 units---------------- 191 October 19----95 units-199 October 20-----100 units------------ 200 October 21 ----110 units ------------- 210   October 22-----115 units------------- 205 October 23-----115 units------------ 189 October 24--------- 120 units------ 157 October 25--------- 120 units------- 149 October 26------------ 120 units------ 199 October 27------------ 120 units-----125 October 28----------- 120 units-------- 104 October 29------------ 120 units---------- 169 October 30------------ 120  units----------- 137 October 31---------- gave 120 units--- morning of November 1--- 157  In addition to the insulin she is taking her metformin and is tolerating this with no GI side effects at current dose. Today she reports that she has been taking blood pressure medications as directed. Having no lightheadedness or other adverse effects. She is taking her Crestor. No myalgias or other adverse effects. She has no specific concerns to address today.    Past Medical History:  Diagnosis Date  . Allergy   . Anxiety 2013  . Asthma 2015  . Depression 2013  . Diabetes mellitus 2004  . GERD (gastroesophageal reflux disease) 2013  . Hyperlipidemia 1992  . Hypertension 1992  . Memory impairment      Home Meds: Outpatient Medications Prior to Visit  Medication Sig Dispense Refill  . aspirin 81 MG tablet Take 1 tablet (81 mg total) by mouth daily. 30 tablet 11  . blood glucose meter kit and supplies KIT Inject 1 each into the skin as directed. Dispense based on patient and insurance preference. Check blood sugar fasting each morning.  ICD E11.65 1 each 0  . Blood Glucose Monitoring Suppl (FREESTYLE LITE) DEVI UAD.E11.65  0  . [facility] D3 2000 units CAPS Take 2 capsules (4,000 Units total) by mouth daily. 30 each 3  . glucose blood test strip Dispense based on patient and insurance preference.  Check blood  sugar fasting each morning.  ICD E11.65 50 each 12  . Insulin Glargine (LANTUS SOLOSTAR) 100 UNIT/ML Solostar Pen Administer daily as directed----up to 30 units daily (Patient taking differently: Inject 65 Units into the skin daily at 10 pm. Patient taking 120 units daily) 5 pen PRN  . Insulin Pen Needle 32G X 4 MM MISC Use with insulin qd 100 each 3  . Lancets MISC Dispense based on patient and insurance preference.  Check blood sugar fasting each morning.  ICD E11.65 100 each 3  . lisinopril-hydrochlorothiazide (PRINZIDE,ZESTORETIC) 20-12.5 MG tablet TAKE 1 TABLET BY MOUTH DAILY. 90 tablet 3    . LORazepam (ATIVAN) 1 MG tablet TAKE 1 TABLET BY MOUTH TWICE A DAY 60 tablet 2  . metFORMIN (GLUCOPHAGE) 500 MG tablet Take 1 tablet (500 mg total) by mouth 2 (two) times daily with a meal. 60 tablet 3  . mirtazapine (REMERON) 30 MG tablet Take 1 tablet (30 mg total) by mouth at bedtime. 30 tablet 5  . omeprazole (PRILOSEC) 20 MG capsule TAKE ONE CAPSULE EVERY DAY 30 capsule 1  . rosuvastatin (CRESTOR) 40 MG tablet TAKE 1 TABLET (40 MG TOTAL) BY MOUTH DAILY. 30 tablet 4   No facility-administered medications prior to visit.     Allergies:  Allergies  Allergen Reactions  . Contrast Media [Iodinated Diagnostic Agents] Anaphylaxis  . Ambien [Zolpidem Tartrate] Other (See Comments)    Family has told her that after taking Ambien, she does "crazy things" that she does not remember doing.   . Codeine Nausea And Vomiting    Social History   Social History  . Marital status: Married    Spouse name: N/A  . Number of children: N/A  . Years of education: N/A   Occupational History  . Not on file.   Social History Main Topics  . Smoking status: Never Smoker  . Smokeless tobacco: Never Used  . Alcohol use No  . Drug use: No  . Sexual activity: Yes   Other Topics Concern  . Not on file   Social History Narrative   Married.    Lives with husband and youngest son, who is 72 y/o.   Has 4 children.   Does not work outside of home.    No formal exercise. Only activity around the house.   Tries to be careful with Diabetic diet.    Family History  Problem Relation Age of Onset  . Cancer Father        Lymphoma, Leukemia  . Depression Father   . Cancer Maternal Grandfather   . Cancer Paternal Grandmother   . Diabetes Paternal Grandfather   . Kidney disease Paternal Grandfather      Review of Systems:  See HPI for pertinent ROS. All other ROS negative.    Physical Exam: Blood pressure 130/82, pulse 82, temperature (!) 97.4 F (36.3 C), temperature source Oral, resp. rate  18, height '5\' 1"'$  (1.549 m), weight 107.1 kg (236 lb 3.2 oz), SpO2 98 %., Body mass index is 44.63 kg/m. General: Obese WF. Appears in no acute distress. Neck: Supple. No thyromegaly. No lymphadenopathy. No carotid bruit.  Lungs: Clear bilaterally to auscultation without wheezes, rales, or rhonchi. Breathing is unlabored. Heart: RRR with S1 S2. No murmurs, rubs, or gallops. Abdomen: Soft, non-tender, non-distended with normoactive bowel sounds. No hepatomegaly. No rebound/guarding. No obvious abdominal masses. Musculoskeletal:  Strength and tone normal for age. Extremities/Skin: Warm and dry.  Neuro: Alert and oriented X 3. Moves all extremities spontaneously.  Gait is normal. CNII-XII grossly in tact. Psych:  Responds to questions appropriately with a normal affect. Diabetic Foot Exam: Inspection is normal.  Sensation is normal and intact.  1+ bilateral posterior tibial pulses and dorsalis pedis pulses.      ASSESSMENT AND PLAN:  56 y.o. year old female with    Type 2 diabetes mellitus with hyperglycemia  On ACE inhibitor On statin On ASA 81 mg daily  Micro albumin was done 11/05/2013, 12/25/2014, 04/29/2016  She reports that she does have annual diabetic eye exam -Diabetic Foot Exam is good  02/03/2017: She will continue the insulin at 120 units and continue to monitor fasting sugars and then adjust accordingly. She will continue current dose of metformin. A1c to monitor.   Hyperlipidemia She is taking Crestor. 11/03/2016--she assures me that she is taking the Crestor daily. She had FLP/LFT at last visit 07/2016 6 can wait to recheck this. 02/03/2017: She is fasting. We'll check lab today to monitor.   Essential hypertension BP was slightly elevated at visit 10/2015 and is slightly elevated again 01/2016. At that visit lisinopril was increased from 10/12.5 up to 20/12.5. Last Potassium was good at 3.9. 04/29/2016: BP at goal. Cont current meds. BMET normal 01/2016.  07/28/2016: Blood  Pressure at goal. Continue current medications. Check lab to monitor. 02/03/2017 blood pressure is at goal. Continue current medications.  Check lab to monitor.  Vitamin D deficiency On labs 11/05/13 vitamin D was low at 23. Currently she is on vitamin D. I told her to start taking vitamin D over-the-counter 2000 units daily. Recheck Vitamin D at lab 09/23/2014--I am indeed very low. She was told to take prescription 50,000 units weekly for 12 weeks and when she completes this she is to take over-the-counter vitamin D 4000 units daily. At [place] 12/25/14 she states that she is taking vitamin D as directed and reminded her to take the over-the-counter supplements when she completes the prescription strength. She voices understanding and agrees. At [place] 04/02/15 she does not seem certain about what dose of vitamin D she is taking currently. We'll recheck vitamin D level now. 04/29/2016: Cont current dose Vit D 07/28/2016: Last vitamin D level was checked remotely ( > 1 year ago). Will recheck vitamin D level today. 11/03/16 she is to continue her vitamin D and make sure to take daily. Just checked level 4/18 6 can wait to recheck lab. 02/03/2017: Reviewed that her last vitamin D level was low when this was checked 07/2016. We'll recheck lab today to monitor.  Asthma, mild intermittent, uncomplicated 65/0/3546: : This is stable and controlled.  Gastroesophageal reflux disease, esophagitis presence not specified 02/03/2017:  This is stable and controlled.  Anxiety She was on Zoloft 200 mg in past, prior to coming to our office.  At [place] 09/19/2013 discussed: Need to start this back at low dose at 50 mg for couple weeks. Then can increase to 100 mg and then slowly will titrate back up as needed.  At [place] 09/23/2014---- reviewed with patient that she has continued on the above medications since then. She states that current medications are controlling her symptoms well. She is not feeling anxious or depressed with using  Zoloft at 50 mg. Wants to continue current dose and current treatment.  02/03/2017:  This remains stable and controlled on current medications.   Depression See # 7 above. 02/03/2017:  This remains stable and controlled on current medications.  Insomnia At visit 04/02/15 she reports that Ambien has been causing  her to do crazy things during the night that she does not recall doing. Therefore she has stopped the Ambien. At visit 04/02/15 prescribed Remeron 30 mg QHS. Told her to follow-up if this causes adverse effects or is not effective. At visit 01/2016 she states that the Remeron is ineffective. At visit changed her to [place].  She had CPE with me 2015 At ROVs since then, I have tried to update preventive care--such as at one OV I ordered mammogram--she was agreeable--but then she did not go for this---will discuss scheduling another CPE at next OV 02/03/2017----reviewed that she does have a lot of preventive care that is over due. Today I specifically did review that last year after placed order for mammogram but she did not go. Scars this with her today. She states that she will go this time for me to go ahead and order mammogram.  I have placed order for mammogram today.  Also today I am checking screening HIV and hepatitis C labs. At next visit I will discuss having her schedule a CPE to update additional preventive care.   Routine follow-up office visit here in 3 months or follow-up sooner if needed.   Signed, [address] [place], [place], [facility] 02/03/2017 8:31 AM

## 2017-02-04 LAB — COMPLETE METABOLIC PANEL WITH GFR
AG RATIO: 1.4 (calc) (ref 1.0–2.5)
ALT: 50 U/L — AB (ref 6–29)
AST: 46 U/L — AB (ref 10–35)
Albumin: 4.2 g/dL (ref 3.6–5.1)
Alkaline phosphatase (APISO): 74 U/L (ref 33–130)
BUN: 13 mg/dL (ref 7–25)
CALCIUM: 9.7 mg/dL (ref 8.6–10.4)
CHLORIDE: 102 mmol/L (ref 98–110)
CO2: 25 mmol/L (ref 20–32)
Creat: 0.7 mg/dL (ref 0.50–1.05)
GFR, EST NON AFRICAN AMERICAN: 97 mL/min/{1.73_m2} (ref >=60)
GFR, Est African American: 112 mL/min/{1.73_m2} (ref >=60)
GLUCOSE: 167 mg/dL — AB (ref 65–99)
Globulin: 2.9 g/dL (calc) (ref 1.9–3.7)
POTASSIUM: 4.4 mmol/L (ref 3.5–5.3)
Sodium: 139 mmol/L (ref 135–146)
Total Bilirubin: 0.8 mg/dL (ref 0.2–1.2)
Total Protein: 7.1 g/dL (ref 6.1–8.1)

## 2017-02-04 LAB — LIPID PANEL
Cholesterol: 191 mg/dL (ref <200)
HDL: 55 mg/dL (ref >50)
LDL Cholesterol (Calc): 104 mg/dL (calc) — ABNORMAL HIGH
NON-HDL CHOLESTEROL (CALC): 136 mg/dL — AB (ref <130)
TRIGLYCERIDES: 204 mg/dL — AB (ref <150)
Total CHOL/HDL Ratio: 3.5 (calc) (ref <5.0)

## 2017-02-04 LAB — HIV ANTIBODY (ROUTINE TESTING W REFLEX): HIV 1&2 Ab, 4th Generation: NONREACTIVE

## 2017-02-04 LAB — HEMOGLOBIN A1C
EAG (MMOL/L): 11.2 (calc)
HEMOGLOBIN A1C: 8.7 %{Hb} — AB (ref <5.7)
MEAN PLASMA GLUCOSE: 203 (calc)

## 2017-02-04 LAB — VITAMIN D 25 HYDROXY (VIT D DEFICIENCY, FRACTURES): VIT D 25 HYDROXY: 31 ng/mL (ref 30–100)

## 2017-02-04 LAB — HEPATITIS C ANTIBODY
HEP C AB: NONREACTIVE
SIGNAL TO CUT-OFF: 0.07 (ref <1.00)

## 2017-02-07 ENCOUNTER — Telehealth: Payer: Self-pay

## 2017-02-07 MED ORDER — BASAGLAR KWIKPEN 100 UNIT/ML ~~LOC~~ SOPN: PEN_INJECTOR | SUBCUTANEOUS | 3 refills | Status: DC

## 2017-02-07 NOTE — Telephone Encounter (Signed)
Fax from [facility] insurance will not cover lantus they will cover basaglar or levemir which would you like to switch to?

## 2017-02-07 NOTE — Telephone Encounter (Signed)
Will switch to [employer]

## 2017-03-02 ENCOUNTER — Ambulatory Visit: Payer: Self-pay

## 2017-03-05 ENCOUNTER — Other Ambulatory Visit: Payer: Self-pay | Admitting: Physician Assistant

## 2017-03-07 NOTE — Telephone Encounter (Signed)
rx called in

## 2017-03-07 NOTE — Telephone Encounter (Signed)
Approved # 60 + 2 

## 2017-03-07 NOTE — Telephone Encounter (Signed)
Last OV 11/01 Last refill 8/23 Ok to refill?

## 2017-03-09 ENCOUNTER — Encounter: Payer: Self-pay | Admitting: Physician Assistant

## 2017-04-01 ENCOUNTER — Ambulatory Visit
Admission: RE | Admit: 2017-04-01 | Discharge: 2017-04-01 | Disposition: A | Discharge status: Home / Self Care | Payer: 59 | Source: Ambulatory Visit | Attending: Physician Assistant | Admitting: Physician Assistant

## 2017-04-01 DIAGNOSIS — Z1231 Encounter for screening mammogram for malignant neoplasm of breast: Secondary | ICD-10-CM | POA: Diagnosis not present

## 2017-04-01 DIAGNOSIS — Z1239 Encounter for other screening for malignant neoplasm of breast: Secondary | ICD-10-CM

## 2017-04-04 ENCOUNTER — Other Ambulatory Visit: Payer: Self-pay | Admitting: Physician Assistant

## 2017-04-04 DIAGNOSIS — R928 Other abnormal and inconclusive findings on diagnostic imaging of breast: Secondary | ICD-10-CM

## 2017-04-13 ENCOUNTER — Ambulatory Visit
Admission: RE | Admit: 2017-04-13 | Discharge: 2017-04-13 | Disposition: A | Discharge status: Home / Self Care | Payer: 59 | Source: Ambulatory Visit | Attending: Physician Assistant | Admitting: Physician Assistant

## 2017-04-13 ENCOUNTER — Ambulatory Visit: Payer: 59

## 2017-04-13 DIAGNOSIS — R928 Other abnormal and inconclusive findings on diagnostic imaging of breast: Secondary | ICD-10-CM | POA: Diagnosis not present

## 2017-04-19 ENCOUNTER — Encounter: Payer: Self-pay | Admitting: Physician Assistant

## 2017-05-06 ENCOUNTER — Other Ambulatory Visit: Payer: Self-pay | Admitting: Physician Assistant

## 2017-05-06 DIAGNOSIS — IMO0001 Reserved for inherently not codable concepts without codable children: Secondary | ICD-10-CM

## 2017-05-06 DIAGNOSIS — E1165 Type 2 diabetes mellitus with hyperglycemia: Principal | ICD-10-CM

## 2017-05-09 ENCOUNTER — Ambulatory Visit: Payer: 59 | Admitting: Physician Assistant

## 2017-05-17 ENCOUNTER — Encounter: Payer: Self-pay | Admitting: Physician Assistant

## 2017-05-19 ENCOUNTER — Encounter: Payer: Self-pay | Admitting: Physician Assistant

## 2017-05-19 ENCOUNTER — Other Ambulatory Visit: Payer: Self-pay

## 2017-05-19 ENCOUNTER — Ambulatory Visit: Payer: 59 | Admitting: Physician Assistant

## 2017-05-19 VITALS — BP 130/84 | HR 77 | Temp 97.6°F | Resp 14 | Wt 243.6 lb

## 2017-05-19 DIAGNOSIS — F419 Anxiety disorder, unspecified: Secondary | ICD-10-CM

## 2017-05-19 DIAGNOSIS — I10 Essential (primary) hypertension: Secondary | ICD-10-CM | POA: Diagnosis not present

## 2017-05-19 DIAGNOSIS — K219 Gastro-esophageal reflux disease without esophagitis: Secondary | ICD-10-CM

## 2017-05-19 DIAGNOSIS — IMO0001 Reserved for inherently not codable concepts without codable children: Secondary | ICD-10-CM

## 2017-05-19 DIAGNOSIS — E785 Hyperlipidemia, unspecified: Secondary | ICD-10-CM

## 2017-05-19 DIAGNOSIS — E559 Vitamin D deficiency, unspecified: Secondary | ICD-10-CM | POA: Diagnosis not present

## 2017-05-19 DIAGNOSIS — E1165 Type 2 diabetes mellitus with hyperglycemia: Secondary | ICD-10-CM | POA: Diagnosis not present

## 2017-05-19 NOTE — Progress Notes (Signed)
Patient ID: JAE BRUCK MRN: [id number], DOB: 05/31/1960, 57 y.o. Date of Encounter: _0 @  Chief Complaint:  Chief Complaint  Patient presents with  . Follow-up    3 mos    HPI: 57 y.o. year old white female  Presents for follow-up office visit.   She saw me on 09/19/13 as a new patient to establish care. She said that she had been going to [facility] on [facility] in [place]. Said that she had been going there for 30 years. Michela Pitcher that they actually dismissed her (but did not state the reason why).  Michela Pitcher that her last office visit and last labs there were about 3 months prior to her visit here on 09/19/2013. She said that she went there for office visits every 3 months.  However said that she was out of all of her medications except for her diabetes and blood pressure medicines.  Stated that she was seeing no other medical providers except for that family practice. Has seen no gynecologist. Has had no mammogram or colonoscopy or other preventive care.  At that initial visit, she was not fasting so could not check lipid panel. At that visit we just checked CMET and A1c. CMET revealed elevated LFTs. Also showed A1c elevated at 8.9. At that time I said to stop the Crestor and also to find out if she was taking any Tylenol or drinking any alcohol.  At f/u OV 11/05/13  she said that she does not remember getting a message to stop the Crestor. Said that she was still taking Crestor. Said that she does not use any Tylenol and does not drink any type of alcohol. At the time of that lab result also said to increase metformin from 500 twice a day to 1000 mg twice a day. She said that she did make this change. Also said to add Actos 45 mg daily. She says that she did add this medication as well. As it turns out, when we rechecked LFTs on 11/05/13-- they were normal. This was on Crestor and we verified that she was on Crestor at the time of these LFTs were normal.  Therefore  we told her to just go ahead and stay on the Crestor and we will monitor LFTs. Today she reports that she is still on the Crestor.  She says that when she checks her blood sugar she always checks it fasting morning. Says that she checks it about once a week.  She did not bring in her blood sugar log with her today.  She had follow-up office visit with me on 11/05/2013 for complete physical exam.  Today she reports that she is taking the Crestor and has been on this this entire time. She is taking other medications as directed as well. Also verified that she is taking aspirin which was added at prior visit with me.  At 06/11/13 OV she reports that she only occasionally checks BS. Did not bring log.  I reveiwed last labs I did here. 03/11/14 A1C was 7.4---said to add Januvia 63m--she says she did add this and IS taking this daily.   At office visit 09/23/14 I reviewed the fact that at her initial visit with me she had told me that in the past, she had been on Zoloft 200 mg but at the time of that initial visit with me she was off Zoloft completely but was needing to resume it. At that time we started Zoloft at 50 mg with plans  to gradually titrate up the dose. However, thus far the 50 mg dose has been continued. Discussed this with her today and she states that the 50 mg dose seems to be working well for her and she does not feel that she needs to increase the dose. Is controlling her symptoms well as causing no adverse effects.  At visit 09/23/14 she does not bring in any type of blood sugar log or blood sugar reading reports. States that she is taking medications as directed. She has been educated regarding proper diet and exercise but is noncompliant with this.  09/23/2014 lab results showed  --LDL had increased significantly. Past labs that showed LDL 64 and 73 the current LDL at that time was 192. At f/u OV 12/25/14-- she states that "sometimes forgets to take Crestor ". At [place] 57/21/16-- I discussed  the above lab results and the fact that cholesterol is severely elevated without medication but well controlled with medication and the necessity for her to take Crestor on a daily basis. She voices understanding and agrees. ---Also at lab vitamin D was very low and she was told to take prescription strength weekly for 12 weeks then take 4000 units daily. At [place] 12/25/14--States that she is taking this as directed and I reminded her about using the 4000 units daily when she completes the prescription. --Also at lab A1c was 8.0. For her to continue all current medications and also add Glucotrol XL 10 mg daily. 12/25/14-- she states that she is taking this as directed.  At [place] --12/25/2014--- she did bring in paperwork she has been writing sugar readings.  States that she only checks fasting morning readings. She has the following readings: She has 36 readings----none of these are in the 90s. 3 of these are in the 100s. 5 of these are in the 110s. 5 are these are in the 120s.  She has a few readings less than 90--- one reading that's an 24, one that is 69, 1 that is 82, 1 that is 88. She has 4 readings that are higher at 136, 167, 172, 165  At OV 04/02/15: She states that she has stopped Ambien. Says that her family told her that she was doing "crazy things "at night after taking that medicine. States that she does not remember doing and has no recollection of those events. Patient states that she is taking her Crestor now. However she is not fasting today. Says that she drank a "root beer" this morning and had forgotten about needing to be fasting. Also asked about what dose of vitamin D she is currently taking. Reviewed that at lab 09/23/14 she was to do the 50,000 units weekly for 12 weeks then 4000 units daily. She does not seem certain about exactly what she is taking but says that it is prescription. She did not bring in any blood sugars today for me to review. She states that she is taking all medicines  as directed and having no adverse effects other than what is stated about Ambien above.  10/16/2015: Reviewed that at last office visit-- 07/10/15-- A1c was checked and came back 8.8. At that time-- recommended to start Invokana 300 mg daily. Also told patient that if A1c not at goal at recheck with have to add insulin. Told her to be very strict with her carb intake and addition to taking all of the medications. Today she states that she has been taking the Invokana daily. Also reviewed that at the time of her  last lipid panel June 2016 LDL came back 192 and at that time she reported that she had not been taking her Crestor every day. Followed up that issue at other OVs since then. Followed it up again today and she states that she is taking the Crestor every day. She is taking all medications as directed. However did review that her blood pressure is high today. She says that she usually takes her medicines in the morning but because she did not eat much today she was going to wait and take medicines with her dinner tonight. Discussed need to take medications at same time every day. As well when we further discussed whether she was fasting-- she is not. So, cannot check FLP.  01/19/2016: She has no complaints or concerns today. She is taking blood pressure medications as directed. No lightheadedness. Initially she had no complaints but then once I mention her blood pressure being somewhat high again today, as it was at last visit, she says that she has not been getting much sleep and asked if that could be affecting her high blood pressure. Says that the Remeron has no effect. She takes it but doesn't notice any difference. Says that she is getting very little sleep. I asked if she is napping during the day but she says no she does not nap. Says that she checks her blood sugar a couple of times a week fasting morning and gets usually around 130 - 140 - 150. However says that this morning it was  230. She is taking diabetic medicines as directed with no adverse effects. She is fasting today. She is taking cholesterol medication as directed with no adverse effects. Current dose of Zoloft is still working in controlling her anxiety and depression. Causing no adverse effects.  04/29/2016: Says she checks her BS most mornings. Gets ~ 150. She is taking all diabetic meds as directed. No adv effects.  She is taking statin as directed. No myalgias or other adv effects.  She is taking Zoloft daily. This is controlling her anxiety and depression. Is causing no adv effects.   07/28/2016: Reviewed that at lab 04/29/16-- A1c came back at 8.6 and had her add Trulicity. Today she reports that she did add the Trulicity and gives this every Monday. Says that she is having no adverse effects.  Says that she checks fasting blood sugar most mornings and gets around 160. She has no concerns to address today. She is taking all diabetic meds as directed. No adv effects.  She is taking statin as directed. No myalgias or other adv effects.  She is taking Zoloft daily. This is controlling her anxiety and depression. Is causing no adv effects.    11/04/2016: She reports that she checks fasting morning blood sugar about 2-3 times per week. Gets readings of "around 127 or 147 somewhere in that area." Noted at that at the last set of labs I had stated to make sure she was taking her Crestor and vitamin D daily and reviewed this again with her today. She reassures me that she is taking the Crestor (and vitamin D) on a daily basis and not skipping on this. She has no concerns to address today. She is taking all diabetic meds as directed. No adv effects.  She is taking statin as directed. No myalgias or other adv effects.  She is taking Zoloft daily. This is controlling her anxiety and depression. Is causing no adv effects.    12/15/2016: At last office visit  11/04/16 A1c came back at 7.8.  At that time I noted that  she was on maximum dose of 5 different types of diabetic medications.  A1c still not controlled.  Recommended for her to come in for follow-up visit to discuss medication change/insulin. Patient now presents for that follow-up visit.  A/P AT THAT OV-----12/15/2016: Uncontrolled diabetes mellitus type 2 without complications, unspecified whether long term insulin use ([place]) At this time she is to stop all of the following medications:  Invokana, Trulicity, Glucotrol,  Januvia, Actos.  She reports that she had stopped the metformin secondary to nausea and GI upset. Told her that we will restart metformin at low dose. She will take 500 mg once daily for several days. If she is having no nausea or other GI side effects, then she can go up to taking this twice daily.  She will administer the Lantus as follows: She will give 10 units tonight. She will give 15 units tomorrow night (tomorrow will take no oral medication except for metformin 500 mg) Then she will increase Lantus by 1 unit each night until fasting morning sugars are in the 100-150 range.  She is to document fasting morning sugars every morning and bring this to her next OV. Today I gave her a blood sugar log form and showed her how to fill this out--- she is to document the number of units of insulin given each evening and document fasting morning sugars each morning. She will schedule follow-up visit with me in approximately 10 days. I have given her savings card for Lantus and she should have $0 co-pay. If she gets home and has any questions or concerns, she can call me at any time in the interim. Otherwise follow-up visit approximate 10 days.  Note--her son has Type 1 Diabetes and pt has been on Trulicity so she has understanding of adjusting Insulin and she has experience with using pen.  - Insulin Glargine (LANTUS SOLOSTAR) 100 UNIT/ML Solostar Pen; Administer daily as directed----up to 30 units daily  Dispense: 5 pen; Refill:  PRN - metFORMIN (GLUCOPHAGE) 500 MG tablet; Take 1 tablet (500 mg total) by mouth 2 (two) times daily with a meal.  Dispense: 60 tablet; Refill: 3     01/05/2017: Today she reports that she is taking the metformin 1 by mouth twice a day. Says that it is causing a little bit of abdominal discomfort but not much.  Today she does bring her blood sugar log form and has been filling this incorrectly. She did give the 10 units on that first night after last visit and then did increase to the 15 units the following night. She has been increasing by 1 unit each night since then. However her fasting blood sugars are not coming down much.  She is now up to giving 37 units of insulin last night.  Fasting morning blood sugars have been as follows ever since her last visit daily morning sugars have been: 173, 210, 155, 196, 180, 190, 228, 225, 202, 200, 210, 234, 200, 210, 225, 241, 220, 221, 194, 240, 230, 236  A/P AT OV 01/05/2017: Uncontrolled diabetes mellitus type 2 without complications, unspecified whether long term insulin use ([facility])  Continue the metformin 500 mg twice a day. If abdominal/GI side effects worsen then call and notify me. Regarding her insulin dosing she is to do the following: Tonight give 45 units. Then increase by 5 units each night until her fasting blood sugars get down to around 180 Then  increase by 1 unit each evening until fasting sugars get to around 100-150. I discussed this with her and she voices understanding. Skin is that we definitely do not want blood sugar to get too low so deftly do not want to give too much insulin. She voices understanding and agrees.  She is to have follow-up visit in one week. Follow-up sooner if needed. Call if has any questions or concerns about dosing.   01/12/2017: Today she states that she is still tolerating the metformin 500 mg twice a day. Today she states that she did increase the insulin by 5 units at a time some nights and then  by 1 unit at a time some--- as directed at last visit. She does bring in blood sugar log form for me to review. October 1 ----36 units-----240 October 2----37 units------ 230 October 3--- 45 units------ 236 October 4----50 units-------184-- October 5----55 units-------- 176 October 6-----60 units------- 200 October 7------ 61 units-------- 179 October 8------ 62 units-------- 201 October 9--------- 65 units------- blood sugar this morning October 10 was 180  She states that I had given her a savings card to use for her insulin so she has $0 co-pay.   01/19/2017:  Today she brings in blood sugar log form. Has the following information documented. Says that the insulin pen will only go to 80 units--- says it will not doll beyond 80. Therefore has stopped at the 80 units.  October 10----75 units----the following morning blood sugar 191 October 11-----75 units----------------------------------------------- 168 October 12--------------- 75 units-------------------------------------- 176 October 13-----------75 units----------------------------------- 188 October 14----------- 75 units------------------------------ 195 October 15-----------75 units------------------------------186 October 16-------- 80 units-------------------------------------- 188   02/03/2017:  Today she does bring in her blood sugar log form. October 17-----85 units------------------- 187 October 18------ 90 units---------------- 191 October 19----95 units-199 October 20-----100 units------------ 200 October 21 ----110 units ------------- 210   October 22-----115 units------------- 205 October 23-----115 units------------ 189 October 24--------- 120 units------ 157 October 25--------- 120 units------- 149 October 26------------ 120 units------ 199 October 27------------ 120 units-----125 October 28----------- 120 units-------- 104 October 29------------ 120 units---------- 169 October 30------------ 120  units----------- 137 October 31---------- gave 120 units--- morning of November 1--- 157  In addition to the insulin she is taking her metformin and is tolerating this with no GI side effects at current dose. Today she reports that she has been taking blood pressure medications as directed. Having no lightheadedness or other adverse effects. She is taking her Crestor. No myalgias or other adverse effects. She has no specific concerns to address today.   05/19/2017: Today she brings in blood sugar log form.  Fasting readings.  States that she has been giving 120 units of insulin once daily. She has readings documented for every morning.  The readings so far for the month of February all range between 151 - 214.  Except there is one reading that was 120. Continues to also take the metformin and is tolerating this with no adverse effects. Today she reports that she has been taking blood pressure medications as directed. Having no lightheadedness or other adverse effects. She is taking her Crestor. No myalgias or other adverse effects. She has no specific concerns to address today.     Past Medical History:  Diagnosis Date  . Allergy   . Anxiety 2013  . Asthma 2015  . Depression 2013  . Diabetes mellitus 2004  . GERD (gastroesophageal reflux disease) 2013  . Hyperlipidemia 1992  . Hypertension 1992  . Memory impairment  Home Meds: Outpatient Medications Prior to Visit  Medication Sig Dispense Refill  . aspirin 81 MG tablet Take 1 tablet (81 mg total) by mouth daily. 30 tablet 11  . blood glucose meter kit and supplies KIT Inject 1 each into the skin as directed. Dispense based on patient and insurance preference. Check blood sugar fasting each morning.  ICD E11.65 1 each 0  . Blood Glucose Monitoring Suppl (FREESTYLE LITE) DEVI UAD.E11.65  0  . [facility] D3 2000 units CAPS Take 2 capsules (4,000 Units total) by mouth daily. 30 each 3  . glucose blood test strip Dispense based on  patient and insurance preference.  Check blood sugar fasting each morning.  ICD E11.65 50 each 12  . Insulin Glargine (BASAGLAR KWIKPEN) 100 UNIT/ML SOPN INJECT 120 UNITS SUBCUTANEOUSLY DAILY 10 pen 3  . Insulin Pen Needle 32G X 4 MM MISC Use with insulin qd 100 each 3  . Lancets MISC Dispense based on patient and insurance preference.  Check blood sugar fasting each morning.  ICD E11.65 100 each 3  . lisinopril-hydrochlorothiazide (PRINZIDE,ZESTORETIC) 20-12.5 MG tablet TAKE 1 TABLET BY MOUTH DAILY. 90 tablet 3  . LORazepam (ATIVAN) 1 MG tablet TAKE 1 TABLET BY MOUTH TWICE A DAY 60 tablet 2  . metFORMIN (GLUCOPHAGE) 500 MG tablet TAKE 1 TABLET (500 MG TOTAL) BY MOUTH 2 (TWO) TIMES DAILY WITH A MEAL. 60 tablet 3  . mirtazapine (REMERON) 30 MG tablet Take 1 tablet (30 mg total) by mouth at bedtime. 30 tablet 5  . rosuvastatin (CRESTOR) 40 MG tablet TAKE 1 TABLET (40 MG TOTAL) BY MOUTH DAILY. 30 tablet 4  . omeprazole (PRILOSEC) 20 MG capsule TAKE ONE CAPSULE EVERY DAY (Patient not taking: Reported on 05/19/2017) 30 capsule 1   No facility-administered medications prior to visit.     Allergies:  Allergies  Allergen Reactions  . Contrast Media [Iodinated Diagnostic Agents] Anaphylaxis  . Ambien [Zolpidem Tartrate] Other (See Comments)    Family has told her that after taking Ambien, she does "crazy things" that she does not remember doing.   . Codeine Nausea And Vomiting    Social History   Socioeconomic History  . Marital status: Married    Spouse name: Not on file  . Number of children: Not on file  . Years of education: Not on file  . Highest education level: Not on file  Social Needs  . Financial resource strain: Not on file  . Food insecurity - worry: Not on file  . Food insecurity - inability: Not on file  . Transportation needs - medical: Not on file  . Transportation needs - non-medical: Not on file  Occupational History  . Not on file  Tobacco Use  . Smoking status: Never  Smoker  . Smokeless tobacco: Never Used  Substance and Sexual Activity  . Alcohol use: No  . Drug use: No  . Sexual activity: Yes  Other Topics Concern  . Not on file  Social History Narrative   Married.    Lives with husband and youngest son, who is 48 y/o.   Has 4 children.   Does not work outside of home.    No formal exercise. Only activity around the house.   Tries to be careful with Diabetic diet.    Family History  Problem Relation Age of Onset  . Cancer Father        Lymphoma, Leukemia  . Depression Father   . Cancer Maternal Grandfather   . Cancer  Paternal Grandmother   . Diabetes Paternal Grandfather   . Kidney disease Paternal Grandfather      Review of Systems:  See HPI for pertinent ROS. All other ROS negative.    Physical Exam: Blood pressure 130/84, pulse 77, temperature 97.6 F (36.4 C), temperature source Oral, resp. rate 14, weight 110.5 kg (243 lb 9.6 oz), SpO2 97 %., Body mass index is 46.03 kg/m. General: Obese WF. Appears in no acute distress. Neck: Supple. No thyromegaly. No lymphadenopathy. No carotid bruit.  Lungs: Clear bilaterally to auscultation without wheezes, rales, or rhonchi. Breathing is unlabored. Heart: RRR with S1 S2. No murmurs, rubs, or gallops. Abdomen: Soft, non-tender, non-distended with normoactive bowel sounds. No hepatomegaly. No rebound/guarding. No obvious abdominal masses. Musculoskeletal:  Strength and tone normal for age. Extremities/Skin: Warm and dry.  Neuro: Alert and oriented X 3. Moves all extremities spontaneously. Gait is normal. CNII-XII grossly in tact. Psych:  Responds to questions appropriately with a normal affect. Diabetic Foot Exam: Inspection is normal.  Sensation is normal and intact.  1+ bilateral posterior tibial pulses and dorsalis pedis pulses.      ASSESSMENT AND PLAN:  57 y.o. year old female with    Type 2 diabetes mellitus with hyperglycemia  On ACE inhibitor On statin On ASA 81 mg  daily  Micro albumin was done 11/05/2013, 12/25/2014, 04/29/2016  She reports that she does have annual diabetic eye exam -Diabetic Foot Exam is good  02/03/2017: She will continue the insulin at 120 units and continue to monitor fasting sugars and then adjust accordingly. She will continue current dose of metformin. A1c to monitor. 05/19/2017:  She is to titrate up insulin.  Discussed that goal fasting sugar needs to be down between 100 - 150.  Continue metformin.  Hyperlipidemia She is taking Crestor. 11/03/2016--she assures me that she is taking the Crestor daily. She had FLP/LFT at last visit 07/2016 6 can wait to recheck this. 02/03/2017: She is fasting. We'll check lab today to monitor.  05/19/2017: She had FLP LFT 02/03/17.  Continue Crestor.  Essential hypertension BP was slightly elevated at visit 10/2015 and is slightly elevated again 01/2016. At that visit lisinopril was increased from 10/12.5 up to 20/12.5. Last Potassium was good at 3.9. 04/29/2016: BP at goal. Cont current meds. BMET normal 01/2016.  07/28/2016: Blood Pressure at goal. Continue current medications. Check lab to monitor. 05/19/2017:  blood pressure is at goal. Continue current medications.  Check lab to monitor.  Vitamin D deficiency On labs 11/05/13 vitamin D was low at 23. Currently she is on vitamin D. I told her to start taking vitamin D over-the-counter 2000 units daily. Recheck Vitamin D at lab 09/23/2014--I am indeed very low. She was told to take prescription 50,000 units weekly for 12 weeks and when she completes this she is to take over-the-counter vitamin D 4000 units daily. At [place] 12/25/14 she states that she is taking vitamin D as directed and reminded her to take the over-the-counter supplements when she completes the prescription strength. She voices understanding and agrees. At [place] 04/02/15 she does not seem certain about what dose of vitamin D she is taking currently. We'll recheck vitamin D level now. 04/29/2016:  Cont current dose Vit D 07/28/2016: Last vitamin D level was checked remotely ( > 1 year ago). Will recheck vitamin D level today. 11/03/16 she is to continue her vitamin D and make sure to take daily. Just checked level 4/18 6 can wait to recheck lab. 02/03/2017: Reviewed  that her last vitamin D level was low when this was checked 07/2016. We'll recheck lab today to monitor. 05/19/2017: Vitamin D level was within normal range at lab 02/03/17.  Continue current dose of vitamin D.  Asthma, mild intermittent, uncomplicated 8/97/9150:  : This is stable and controlled.  Gastroesophageal reflux disease, esophagitis presence not specified 05/19/2017: This is stable and controlled.  Anxiety She was on Zoloft 200 mg in past, prior to coming to our office.  At [place] 09/19/2013 discussed: Need to start this back at low dose at 50 mg for couple weeks. Then can increase to 100 mg and then slowly will titrate back up as needed.  At [place] 09/23/2014---- reviewed with patient that she has continued on the above medications since then. She states that current medications are controlling her symptoms well. She is not feeling anxious or depressed with using Zoloft at 50 mg. Wants to continue current dose and current treatment.  05/19/2017:   This remains stable and controlled on current medications.   Depression See # 7 above. 05/19/2017:   This remains stable and controlled on current medications.  Insomnia At visit 04/02/15 she reports that Ambien has been causing her to do crazy things during the night that she does not recall doing. Therefore she has stopped the Ambien. At visit 04/02/15 prescribed Remeron 30 mg QHS. Told her to follow-up if this causes adverse effects or is not effective. At visit 01/2016 she states that the Remeron is ineffective. At visit changed her to [place].  She had CPE with me 2015 At [facility] since then, I have tried to update preventive care--such as at one OV I ordered mammogram--she was  agreeable--but then she did not go for this---will discuss scheduling another CPE at next OV 02/03/2017----reviewed that she does have a lot of preventive care that is over due. Today I specifically did review that last year after placed order for mammogram but she did not go. Scars this with her today. She states that she will go this time for me to go ahead and order mammogram.  I have placed order for mammogram today.  Also today I am checking screening HIV and hepatitis C labs. At next visit I will discuss having her schedule a CPE to update additional preventive care.   Routine follow-up office visit here in 3 months or follow-up sooner if needed.   Signed, [address] [place], [place], [facility] 05/19/2017 11:47 AM

## 2017-05-20 LAB — HEMOGLOBIN A1C
Hgb A1c MFr Bld: 9.2 % of total Hgb — ABNORMAL HIGH (ref <5.7)
Mean Plasma Glucose: 217 (calc)
eAG (mmol/L): 12 (calc)

## 2017-05-21 ENCOUNTER — Other Ambulatory Visit: Payer: Self-pay | Admitting: Physician Assistant

## 2017-05-23 NOTE — Progress Notes (Signed)
Mailbox not set up/slj

## 2017-06-06 ENCOUNTER — Other Ambulatory Visit: Payer: Self-pay | Admitting: Physician Assistant

## 2017-06-27 ENCOUNTER — Encounter: Payer: Self-pay | Admitting: Physician Assistant

## 2017-06-29 ENCOUNTER — Other Ambulatory Visit: Payer: Self-pay | Admitting: Physician Assistant

## 2017-06-30 ENCOUNTER — Other Ambulatory Visit: Payer: Self-pay | Admitting: Physician Assistant

## 2017-06-30 NOTE — Telephone Encounter (Signed)
Last OV 05/19/2017 Last refill? 03/07/2017 Ok to refill?

## 2017-07-05 ENCOUNTER — Other Ambulatory Visit: Payer: Self-pay | Admitting: Physician Assistant

## 2017-08-18 ENCOUNTER — Ambulatory Visit: Payer: 59 | Admitting: Physician Assistant

## 2017-08-20 ENCOUNTER — Other Ambulatory Visit: Payer: Self-pay | Admitting: Physician Assistant

## 2017-08-22 ENCOUNTER — Encounter: Payer: Self-pay | Admitting: Physician Assistant

## 2017-08-22 ENCOUNTER — Other Ambulatory Visit: Payer: Self-pay | Admitting: Physician Assistant

## 2017-08-22 ENCOUNTER — Other Ambulatory Visit: Payer: Self-pay

## 2017-08-22 ENCOUNTER — Ambulatory Visit: Payer: 59 | Admitting: Physician Assistant

## 2017-08-22 VITALS — BP 116/80 | HR 67 | Temp 97.4°F | Resp 18 | Ht 61.0 in | Wt 248.2 lb

## 2017-08-22 DIAGNOSIS — I10 Essential (primary) hypertension: Secondary | ICD-10-CM | POA: Diagnosis not present

## 2017-08-22 DIAGNOSIS — E559 Vitamin D deficiency, unspecified: Secondary | ICD-10-CM

## 2017-08-22 DIAGNOSIS — E1165 Type 2 diabetes mellitus with hyperglycemia: Secondary | ICD-10-CM

## 2017-08-22 DIAGNOSIS — E785 Hyperlipidemia, unspecified: Secondary | ICD-10-CM | POA: Diagnosis not present

## 2017-08-22 DIAGNOSIS — IMO0001 Reserved for inherently not codable concepts without codable children: Secondary | ICD-10-CM

## 2017-08-22 DIAGNOSIS — F419 Anxiety disorder, unspecified: Secondary | ICD-10-CM

## 2017-08-22 NOTE — Progress Notes (Signed)
[name] MRN: [id number], DOB: Jan 06, 1961, 57 y.o. Date of Encounter: _0 @  Chief Complaint:  Chief Complaint  Patient presents with  . Follow-up    3 mos    HPI: 57 y.o. year old white female  Presents for follow-up office visit.   She saw me on 09/19/13 as a new patient to establish care. She said that she had been going to [facility] on [facility] in [place]. Said that she had been going there for 30 years. Michela Pitcher that they actually dismissed her (but did not state the reason why).  Michela Pitcher that her last office visit and last labs there were about 3 months prior to her visit here on 09/19/2013. She said that she went there for office visits every 3 months.  However said that she was out of all of her medications except for her diabetes and blood pressure medicines.  Stated that she was seeing no other medical providers except for that family practice. Has seen no gynecologist. Has had no mammogram or colonoscopy or other preventive care.  At that initial visit, she was not fasting so could not check lipid panel. At that visit we just checked CMET and A1c. CMET revealed elevated LFTs. Also showed A1c elevated at 8.9. At that time I said to stop the Crestor and also to find out if she was taking any Tylenol or drinking any alcohol.  At f/u OV 11/05/13  she said that she does not remember getting a message to stop the Crestor. Said that she was still taking Crestor. Said that she does not use any Tylenol and does not drink any type of alcohol. At the time of that lab result also said to increase metformin from 500 twice a day to 1000 mg twice a day. She said that she did make this change. Also said to add Actos 45 mg daily. She says that she did add this medication as well. As it turns out, when we rechecked LFTs on 11/05/13-- they were normal. This was on Crestor and we verified that she was on Crestor at the time of these LFTs were normal.  Therefore  we told her to just go ahead and stay on the Crestor and we will monitor LFTs. Today she reports that she is still on the Crestor.  She says that when she checks her blood sugar she always checks it fasting morning. Says that she checks it about once a week.  She did not bring in her blood sugar log with her today.  She had follow-up office visit with me on 11/05/2013 for complete physical exam.  Today she reports that she is taking the Crestor and has been on this this entire time. She is taking other medications as directed as well. Also verified that she is taking aspirin which was added at prior visit with me.  At 06/11/13 OV she reports that she only occasionally checks BS. Did not bring log.  I reveiwed last labs I did here. 03/11/14 A1C was 7.4---said to add Januvia 44m--she says she did add this and IS taking this daily.   At office visit 09/23/14 I reviewed the fact that at her initial visit with me she had told me that in the past, she had been on Zoloft 200 mg but at the time of that initial visit with me she was off Zoloft completely but was needing to resume it. At that time we started Zoloft at 50 mg with plans  to gradually titrate up the dose. However, thus far the 50 mg dose has been continued. Discussed this with her today and she states that the 50 mg dose seems to be working well for her and she does not feel that she needs to increase the dose. Is controlling her symptoms well as causing no adverse effects.  At visit 09/23/14 she does not bring in any type of blood sugar log or blood sugar reading reports. States that she is taking medications as directed. She has been educated regarding proper diet and exercise but is noncompliant with this.  09/23/2014 lab results showed  --LDL had increased significantly. Past labs that showed LDL 64 and 73 the current LDL at that time was 192. At f/u OV 12/25/14-- she states that "sometimes forgets to take Crestor ". At [place] 12/25/14-- I discussed  the above lab results and the fact that cholesterol is severely elevated without medication but well controlled with medication and the necessity for her to take Crestor on a daily basis. She voices understanding and agrees. ---Also at lab vitamin D was very low and she was told to take prescription strength weekly for 12 weeks then take 4000 units daily. At [place] 12/25/14--States that she is taking this as directed and I reminded her about using the 4000 units daily when she completes the prescription. --Also at lab A1c was 8.0. For her to continue all current medications and also add Glucotrol XL 10 mg daily. 12/25/14-- she states that she is taking this as directed.  At [place] --12/25/2014--- she did bring in paperwork she has been writing sugar readings.  States that she only checks fasting morning readings. She has the following readings: She has 36 readings----none of these are in the 90s. 3 of these are in the 100s. 5 of these are in the 110s. 5 are these are in the 120s.  She has a few readings less than 90--- one reading that's an 24, one that is 69, 1 that is 82, 1 that is 88. She has 4 readings that are higher at 136, 167, 172, 165  At OV 04/02/15: She states that she has stopped Ambien. Says that her family told her that she was doing "crazy things "at night after taking that medicine. States that she does not remember doing and has no recollection of those events. Patient states that she is taking her Crestor now. However she is not fasting today. Says that she drank a "root beer" this morning and had forgotten about needing to be fasting. Also asked about what dose of vitamin D she is currently taking. Reviewed that at lab 09/23/14 she was to do the 50,000 units weekly for 12 weeks then 4000 units daily. She does not seem certain about exactly what she is taking but says that it is prescription. She did not bring in any blood sugars today for me to review. She states that she is taking all medicines  as directed and having no adverse effects other than what is stated about Ambien above.  10/16/2015: Reviewed that at last office visit-- 07/10/15-- A1c was checked and came back 8.8. At that time-- recommended to start Invokana 300 mg daily. Also told patient that if A1c not at goal at recheck with have to add insulin. Told her to be very strict with her carb intake and addition to taking all of the medications. Today she states that she has been taking the Invokana daily. Also reviewed that at the time of her  last lipid panel June 2016 LDL came back 192 and at that time she reported that she had not been taking her Crestor every day. Followed up that issue at other OVs since then. Followed it up again today and she states that she is taking the Crestor every day. She is taking all medications as directed. However did review that her blood pressure is high today. She says that she usually takes her medicines in the morning but because she did not eat much today she was going to wait and take medicines with her dinner tonight. Discussed need to take medications at same time every day. As well when we further discussed whether she was fasting-- she is not. So, cannot check FLP.  01/19/2016: She has no complaints or concerns today. She is taking blood pressure medications as directed. No lightheadedness. Initially she had no complaints but then once I mention her blood pressure being somewhat high again today, as it was at last visit, she says that she has not been getting much sleep and asked if that could be affecting her high blood pressure. Says that the Remeron has no effect. She takes it but doesn't notice any difference. Says that she is getting very little sleep. I asked if she is napping during the day but she says no she does not nap. Says that she checks her blood sugar a couple of times a week fasting morning and gets usually around 130 - 140 - 150. However says that this morning it was  230. She is taking diabetic medicines as directed with no adverse effects. She is fasting today. She is taking cholesterol medication as directed with no adverse effects. Current dose of Zoloft is still working in controlling her anxiety and depression. Causing no adverse effects.  04/29/2016: Says she checks her BS most mornings. Gets ~ 150. She is taking all diabetic meds as directed. No adv effects.  She is taking statin as directed. No myalgias or other adv effects.  She is taking Zoloft daily. This is controlling her anxiety and depression. Is causing no adv effects.   07/28/2016: Reviewed that at lab 04/29/16-- A1c came back at 8.6 and had her add Trulicity. Today she reports that she did add the Trulicity and gives this every Monday. Says that she is having no adverse effects.  Says that she checks fasting blood sugar most mornings and gets around 160. She has no concerns to address today. She is taking all diabetic meds as directed. No adv effects.  She is taking statin as directed. No myalgias or other adv effects.  She is taking Zoloft daily. This is controlling her anxiety and depression. Is causing no adv effects.    11/04/2016: She reports that she checks fasting morning blood sugar about 2-3 times per week. Gets readings of "around 127 or 147 somewhere in that area." Noted at that at the last set of labs I had stated to make sure she was taking her Crestor and vitamin D daily and reviewed this again with her today. She reassures me that she is taking the Crestor (and vitamin D) on a daily basis and not skipping on this. She has no concerns to address today. She is taking all diabetic meds as directed. No adv effects.  She is taking statin as directed. No myalgias or other adv effects.  She is taking Zoloft daily. This is controlling her anxiety and depression. Is causing no adv effects.    12/15/2016: At last office visit  11/04/16 A1c came back at 7.8.  At that time I noted that  she was on maximum dose of 5 different types of diabetic medications.  A1c still not controlled.  Recommended for her to come in for follow-up visit to discuss medication change/insulin. Patient now presents for that follow-up visit.  A/P AT THAT OV-----12/15/2016: Uncontrolled diabetes mellitus type 2 without complications, unspecified whether long term insulin use ([place]) At this time she is to stop all of the following medications:  Invokana, Trulicity, Glucotrol,  Januvia, Actos.  She reports that she had stopped the metformin secondary to nausea and GI upset. Told her that we will restart metformin at low dose. She will take 500 mg once daily for several days. If she is having no nausea or other GI side effects, then she can go up to taking this twice daily.  She will administer the Lantus as follows: She will give 10 units tonight. She will give 15 units tomorrow night (tomorrow will take no oral medication except for metformin 500 mg) Then she will increase Lantus by 1 unit each night until fasting morning sugars are in the 100-150 range.  She is to document fasting morning sugars every morning and bring this to her next OV. Today I gave her a blood sugar log form and showed her how to fill this out--- she is to document the number of units of insulin given each evening and document fasting morning sugars each morning. She will schedule follow-up visit with me in approximately 10 days. I have given her savings card for Lantus and she should have $0 co-pay. If she gets home and has any questions or concerns, she can call me at any time in the interim. Otherwise follow-up visit approximate 10 days.  Note--her son has Type 1 Diabetes and pt has been on Trulicity so she has understanding of adjusting Insulin and she has experience with using pen.  - Insulin Glargine (LANTUS SOLOSTAR) 100 UNIT/ML Solostar Pen; Administer daily as directed----up to 30 units daily  Dispense: 5 pen; Refill:  PRN - metFORMIN (GLUCOPHAGE) 500 MG tablet; Take 1 tablet (500 mg total) by mouth 2 (two) times daily with a meal.  Dispense: 60 tablet; Refill: 3     01/05/2017: Today she reports that she is taking the metformin 1 by mouth twice a day. Says that it is causing a little bit of abdominal discomfort but not much.  Today she does bring her blood sugar log form and has been filling this incorrectly. She did give the 10 units on that first night after last visit and then did increase to the 15 units the following night. She has been increasing by 1 unit each night since then. However her fasting blood sugars are not coming down much.  She is now up to giving 37 units of insulin last night.  Fasting morning blood sugars have been as follows ever since her last visit daily morning sugars have been: 173, 210, 155, 196, 180, 190, 228, 225, 202, 200, 210, 234, 200, 210, 225, 241, 220, 221, 194, 240, 230, 236  A/P AT OV 01/05/2017: Uncontrolled diabetes mellitus type 2 without complications, unspecified whether long term insulin use ([facility])  Continue the metformin 500 mg twice a day. If abdominal/GI side effects worsen then call and notify me. Regarding her insulin dosing she is to do the following: Tonight give 45 units. Then increase by 5 units each night until her fasting blood sugars get down to around 180 Then  increase by 1 unit each evening until fasting sugars get to around 100-150. I discussed this with her and she voices understanding. Skin is that we definitely do not want blood sugar to get too low so deftly do not want to give too much insulin. She voices understanding and agrees.  She is to have follow-up visit in one week. Follow-up sooner if needed. Call if has any questions or concerns about dosing.   01/12/2017: Today she states that she is still tolerating the metformin 500 mg twice a day. Today she states that she did increase the insulin by 5 units at a time some nights and then  by 1 unit at a time some--- as directed at last visit. She does bring in blood sugar log form for me to review. October 1 ----36 units-----240 October 2----37 units------ 230 October 3--- 45 units------ 236 October 4----50 units-------184-- October 5----55 units-------- 176 October 6-----60 units------- 200 October 7------ 61 units-------- 179 October 8------ 62 units-------- 201 October 9--------- 65 units------- blood sugar this morning October 10 was 180  She states that I had given her a savings card to use for her insulin so she has $0 co-pay.   01/19/2017:  Today she brings in blood sugar log form. Has the following information documented. Says that the insulin pen will only go to 80 units--- says it will not doll beyond 80. Therefore has stopped at the 80 units.  October 10----75 units----the following morning blood sugar 191 October 11-----75 units----------------------------------------------- 168 October 12--------------- 75 units-------------------------------------- 176 October 13-----------75 units----------------------------------- 188 October 14----------- 75 units------------------------------ 195 October 15-----------75 units------------------------------186 October 16-------- 80 units-------------------------------------- 188   02/03/2017:  Today she does bring in her blood sugar log form. October 17-----85 units------------------- 187 October 18------ 90 units---------------- 191 October 19----95 units-199 October 20-----100 units------------ 200 October 21 ----110 units ------------- 210   October 22-----115 units------------- 205 October 23-----115 units------------ 189 October 24--------- 120 units------ 157 October 25--------- 120 units------- 149 October 26------------ 120 units------ 199 October 27------------ 120 units-----125 October 28----------- 120 units-------- 104 October 29------------ 120 units---------- 169 October 30------------ 120  units----------- 137 October 31---------- gave 120 units--- morning of November 1--- 157  In addition to the insulin she is taking her metformin and is tolerating this with no GI side effects at current dose. Today she reports that she has been taking blood pressure medications as directed. Having no lightheadedness or other adverse effects. She is taking her Crestor. No myalgias or other adverse effects. She has no specific concerns to address today.   05/19/2017: Today she brings in blood sugar log form.  Fasting readings.  States that she has been giving 120 units of insulin once daily. She has readings documented for every morning.  The readings so far for the month of February all range between 151 - 214.  Except there is one reading that was 120. Continues to also take the metformin and is tolerating this with no adverse effects. Today she reports that she has been taking blood pressure medications as directed. Having no lightheadedness or other adverse effects. She is taking her Crestor. No myalgias or other adverse effects. She has no specific concerns to address today.   08/22/2017:  Today she does bring in blood sugar log form for me.   Reports that she currently is giving insulin at 165 units once daily.   She has daily blood sugars documented.   Blood sugar readings dated May 1 through May 18 are: 91, 101, 127, 137, 114, 109,  107, 107, 105, 102, 109, 105, 160, 75, 76, 91, 149  She does continue to take the metformin and is having no adverse effects with this. She is taking blood pressure medications as directed.  Having no lightheadedness or other adverse effects. She is taking Crestor daily and this is causing no myalgias or other adverse effects. She has no specific concerns to address today.    Past Medical History:  Diagnosis Date  . Allergy   . Anxiety 2013  . Asthma 2015  . Depression 2013  . Diabetes mellitus 2004  . GERD (gastroesophageal reflux disease) 2013    . Hyperlipidemia 1992  . Hypertension 1992  . Memory impairment      Home Meds: Outpatient Medications Prior to Visit  Medication Sig Dispense Refill  . aspirin 81 MG tablet Take 1 tablet (81 mg total) by mouth daily. 30 tablet 11  . blood glucose meter kit and supplies KIT Inject 1 each into the skin as directed. Dispense based on patient and insurance preference. Check blood sugar fasting each morning.  ICD E11.65 1 each 0  . Blood Glucose Monitoring Suppl (FREESTYLE LITE) DEVI UAD.E11.65  0  . [facility] D3 2000 units CAPS Take 2 capsules (4,000 Units total) by mouth daily. 30 each 3  . glucose blood test strip Dispense based on patient and insurance preference.  Check blood sugar fasting each morning.  ICD E11.65 50 each 12  . Insulin Glargine (BASAGLAR KWIKPEN) 100 UNIT/ML SOPN INJECT 120 UNITS UNDER THE SKIN EVERY DAY 5 pen 3  . Insulin Pen Needle 32G X 4 MM MISC Use with insulin qd 100 each 3  . Lancets MISC Dispense based on patient and insurance preference.  Check blood sugar fasting each morning.  ICD E11.65 100 each 3  . lisinopril-hydrochlorothiazide (PRINZIDE,ZESTORETIC) 20-12.5 MG tablet TAKE 1 TABLET BY MOUTH DAILY. 90 tablet 3  . LORazepam (ATIVAN) 1 MG tablet TAKE 1 TABLET BY MOUTH TWICE A DAY 60 tablet 2  . metFORMIN (GLUCOPHAGE) 500 MG tablet TAKE 1 TABLET (500 MG TOTAL) BY MOUTH 2 (TWO) TIMES DAILY WITH A MEAL. 60 tablet 3  . mirtazapine (REMERON) 30 MG tablet TAKE 1 TABLET AT BEDTIME 30 tablet 5  . rosuvastatin (CRESTOR) 40 MG tablet TAKE 1 TABLET (40 MG TOTAL) BY MOUTH DAILY. 30 tablet 4   No facility-administered medications prior to visit.     Allergies:  Allergies  Allergen Reactions  . Contrast Media [Iodinated Diagnostic Agents] Anaphylaxis  . Ambien [Zolpidem Tartrate] Other (See Comments)    Family has told her that after taking Ambien, she does "crazy things" that she does not remember doing.   . Codeine Nausea And Vomiting    Social History    Socioeconomic History  . Marital status: Married    Spouse name: Not on file  . Number of children: Not on file  . Years of education: Not on file  . Highest education level: Not on file  Occupational History  . Not on file  Social Needs  . Financial resource strain: Not on file  . Food insecurity:    Worry: Not on file    Inability: Not on file  . Transportation needs:    Medical: Not on file    Non-medical: Not on file  Tobacco Use  . Smoking status: Never Smoker  . Smokeless tobacco: Never Used  Substance and Sexual Activity  . Alcohol use: No  . Drug use: No  . Sexual activity: Yes  Lifestyle  .  Physical activity:    Days per week: Not on file    Minutes per session: Not on file  . Stress: Not on file  Relationships  . Social connections:    Talks on phone: Not on file    Gets together: Not on file    Attends religious service: Not on file    Active member of club or organization: Not on file    Attends meetings of clubs or organizations: Not on file    Relationship status: Not on file  . Intimate partner violence:    Fear of current or ex partner: Not on file    Emotionally abused: Not on file    Physically abused: Not on file    Forced sexual activity: Not on file  Other Topics Concern  . Not on file  Social History Narrative   Married.    Lives with husband and youngest son, who is 23 y/o.   Has 4 children.   Does not work outside of home.    No formal exercise. Only activity around the house.   Tries to be careful with Diabetic diet.    Family History  Problem Relation Age of Onset  . Cancer Father        Lymphoma, Leukemia  . Depression Father   . Cancer Maternal Grandfather   . Cancer Paternal Grandmother   . Diabetes Paternal Grandfather   . Kidney disease Paternal Grandfather      Review of Systems:  See HPI for pertinent ROS. All other ROS negative.    Physical Exam: Blood pressure 116/80, pulse 67, temperature (!) 97.4 F (36.3 C),  temperature source Oral, resp. rate 18, height '5\' 1"'$  (1.549 m), weight 112.6 kg (248 lb 3.2 oz), SpO2 96 %., Body mass index is 46.9 kg/m. General: Obese WF. Appears in no acute distress. Neck: Supple. No thyromegaly. No lymphadenopathy. Lungs: Clear bilaterally to auscultation without wheezes, rales, or rhonchi. Breathing is unlabored. Heart: RRR with S1 S2. No murmurs, rubs, or gallops. Abdomen: Soft, non-tender, non-distended with normoactive bowel sounds. No hepatomegaly. No rebound/guarding. No obvious abdominal masses. Musculoskeletal:  Strength and tone normal for age. Extremities/Skin: Warm and dry.  No LE edema.  Neuro: Alert and oriented X 3. Moves all extremities spontaneously. Gait is normal. CNII-XII grossly in tact. Psych:  Responds to questions appropriately with a normal affect.   Diabetic foot exam: Inspection is normal bilaterally.  Sensation is intact throughout bilaterally.  DP and posterior tibial pulses are 1+ bilaterally.  ASSESSMENT AND PLAN:  57 y.o. year old female with    Type 2 diabetes mellitus with hyperglycemia  On ACE inhibitor On statin On ASA 81 mg daily  Micro albumin was done 11/05/2013, 12/25/2014, 04/29/2016, 08/22/2017  She reports that she does have annual diabetic eye exam -Diabetic Foot Exam is good  02/03/2017: She will continue the insulin at 120 units and continue to monitor fasting sugars and then adjust accordingly. She will continue current dose of metformin. A1c to monitor. 05/19/2017:  She is to titrate up insulin.  Discussed that goal fasting sugar needs to be down between 100 - 150.  Continue metformin. 08/22/2017: She reports that she is now giving insulin at 165 units daily.  She does bring in blood sugar log form today which looks good.  See HPI for details.  Check A1c today to make sure this is consistent with her home readings.  Also due to check microalbumin.  Continue metformin and insulin.  Hyperlipidemia She  is taking  Crestor. 11/03/2016--she assures me that she is taking the Crestor daily. She had FLP/LFT at last visit 07/2016 6 can wait to recheck this. 02/03/2017: She is fasting. We'll check lab today to monitor.  05/19/2017: She had FLP LFT 02/03/17.  Continue Crestor. 08/22/2017: She is fasting today.  Recheck FLP/LFT.  On Crestor.  Essential hypertension BP was slightly elevated at visit 10/2015 and is slightly elevated again 01/2016. At that visit lisinopril was increased from 10/12.5 up to 20/12.5. Last Potassium was good at 3.9. 04/29/2016: BP at goal. Cont current meds. BMET normal 01/2016.  07/28/2016: Blood Pressure at goal. Continue current medications. Check lab to monitor. 05/19/2017:  blood pressure is at goal. Continue current medications.  Check lab to monitor. 08/22/2017: Blood Pressure is at goal.  On ACE inhibitor.  Check labs to monitor.  Vitamin D deficiency On labs 11/05/13 vitamin D was low at 23. Currently she is on vitamin D. I told her to start taking vitamin D over-the-counter 2000 units daily. Recheck Vitamin D at lab 09/23/2014--I am indeed very low. She was told to take prescription 50,000 units weekly for 12 weeks and when she completes this she is to take over-the-counter vitamin D 4000 units daily. At [place] 12/25/14 she states that she is taking vitamin D as directed and reminded her to take the over-the-counter supplements when she completes the prescription strength. She voices understanding and agrees. At [place] 04/02/15 she does not seem certain about what dose of vitamin D she is taking currently. We'll recheck vitamin D level now. 04/29/2016: Cont current dose Vit D 07/28/2016: Last vitamin D level was checked remotely ( > 1 year ago). Will recheck vitamin D level today. 11/03/16 she is to continue her vitamin D and make sure to take daily. Just checked level 4/18 6 can wait to recheck lab. 02/03/2017: Reviewed that her last vitamin D level was low when this was checked 07/2016. We'll recheck lab  today to monitor. 05/19/2017: Vitamin D level was within normal range at lab 02/03/17.  Continue current dose of vitamin D. 08/22/2017: Lab 02/2017 vitamin D was at 57.  Continue current dose vitamin D.  Asthma, mild intermittent, uncomplicated 10/14/1973:  : This is stable and controlled.  Gastroesophageal reflux disease, esophagitis presence not specified 08/22/2017: This is stable and controlled.  Anxiety She was on Zoloft 200 mg in past, prior to coming to our office.  At [place] 09/19/2013 discussed: Need to start this back at low dose at 50 mg for couple weeks. Then can increase to 100 mg and then slowly will titrate back up as needed.  At [place] 09/23/2014---- reviewed with patient that she has continued on the above medications since then. She states that current medications are controlling her symptoms well. She is not feeling anxious or depressed with using Zoloft at 50 mg. Wants to continue current dose and current treatment.  08/22/2017:   This remains stable and controlled on current medications.   Depression See # 7 above. 08/22/2017:   This remains stable and controlled on current medications.  Insomnia At visit 04/02/15 she reports that Ambien has been causing her to do crazy things during the night that she does not recall doing. Therefore she has stopped the Ambien. At visit 04/02/15 prescribed Remeron 30 mg QHS. Told her to follow-up if this causes adverse effects or is not effective. At visit 01/2016 she states that the Remeron is ineffective. At visit changed her to [place].  She had CPE with  me 2015 At [facility] since then, I have tried to update preventive care--such as at one OV I ordered mammogram--she was agreeable--but then she did not go for this---will discuss scheduling another CPE at next OV 02/03/2017----reviewed that she does have a lot of preventive care that is over due. Today I specifically did review that last year after placed order for mammogram but she did not go. Scars  this with her today. She states that she will go this time for me to go ahead and order mammogram.  I have placed order for mammogram today.  Also today I am checking screening HIV and hepatitis C labs. At next visit I will discuss having her schedule a CPE to update additional preventive care.   Routine follow-up office visit here in 3 months or follow-up sooner if needed.   Signed, [address] [place], [place], [facility] 08/22/2017 10:28 AM

## 2017-08-23 LAB — COMPLETE METABOLIC PANEL WITH GFR
AG RATIO: 1.4 (calc) (ref 1.0–2.5)
ALBUMIN MSPROF: 3.8 g/dL (ref 3.6–5.1)
ALKALINE PHOSPHATASE (APISO): 66 U/L (ref 33–130)
ALT: 23 U/L (ref 6–29)
AST: 23 U/L (ref 10–35)
BUN: 9 mg/dL (ref 7–25)
CO2: 27 mmol/L (ref 20–32)
Calcium: 9.3 mg/dL (ref 8.6–10.4)
Chloride: 107 mmol/L (ref 98–110)
Creat: 0.64 mg/dL (ref 0.50–1.05)
GFR, EST AFRICAN AMERICAN: 115 mL/min/{1.73_m2} (ref >=60)
GFR, Est Non African American: 99 mL/min/{1.73_m2} (ref >=60)
GLOBULIN: 2.8 g/dL (ref 1.9–3.7)
Glucose, Bld: 123 mg/dL — ABNORMAL HIGH (ref 65–99)
Potassium: 4.3 mmol/L (ref 3.5–5.3)
SODIUM: 142 mmol/L (ref 135–146)
Total Bilirubin: 0.4 mg/dL (ref 0.2–1.2)
Total Protein: 6.6 g/dL (ref 6.1–8.1)

## 2017-08-23 LAB — LIPID PANEL
CHOLESTEROL: 187 mg/dL (ref <200)
HDL: 51 mg/dL (ref >50)
LDL Cholesterol (Calc): 108 mg/dL (calc) — ABNORMAL HIGH
Non-HDL Cholesterol (Calc): 136 mg/dL (calc) — ABNORMAL HIGH (ref <130)
Total CHOL/HDL Ratio: 3.7 (calc) (ref <5.0)
Triglycerides: 160 mg/dL — ABNORMAL HIGH (ref <150)

## 2017-08-23 LAB — HEMOGLOBIN A1C
EAG (MMOL/L): 10.1 (calc)
Hgb A1c MFr Bld: 8 % of total Hgb — ABNORMAL HIGH (ref <5.7)
MEAN PLASMA GLUCOSE: 183 (calc)

## 2017-08-23 LAB — MICROALBUMIN, URINE: MICROALB UR: 1.2 mg/dL

## 2017-08-31 ENCOUNTER — Other Ambulatory Visit: Payer: Self-pay | Admitting: Physician Assistant

## 2017-08-31 DIAGNOSIS — E1165 Type 2 diabetes mellitus with hyperglycemia: Principal | ICD-10-CM

## 2017-08-31 DIAGNOSIS — IMO0001 Reserved for inherently not codable concepts without codable children: Secondary | ICD-10-CM

## 2017-09-01 ENCOUNTER — Other Ambulatory Visit: Payer: Self-pay

## 2017-09-01 MED ORDER — DULAGLUTIDE 1.5 MG/0.5ML ~~LOC~~ SOAJ: SUBCUTANEOUS | 3 refills | Status: DC

## 2017-09-06 ENCOUNTER — Telehealth: Payer: Self-pay

## 2017-09-06 NOTE — Telephone Encounter (Signed)
Patient called and states when she went to pick up her medication with the coupon the cost was going  be $150 and she can not afford that monthly. Patient was advised to call her insurance and see what they will cover that will be affordable and give our office a call back with that information.Patient verbalizes understanding

## 2017-09-15 ENCOUNTER — Encounter: Payer: Self-pay | Admitting: Physician Assistant

## 2017-09-28 ENCOUNTER — Other Ambulatory Visit: Payer: Self-pay

## 2017-09-28 MED ORDER — BASAGLAR KWIKPEN 100 UNIT/ML ~~LOC~~ SOPN: PEN_INJECTOR | SUBCUTANEOUS | 3 refills | Status: DC

## 2017-10-02 ENCOUNTER — Other Ambulatory Visit: Payer: Self-pay | Admitting: Physician Assistant

## 2017-10-04 ENCOUNTER — Other Ambulatory Visit: Payer: Self-pay | Admitting: Physician Assistant

## 2017-10-05 NOTE — Telephone Encounter (Signed)
Last OV 08/22/2017 Last refill 06/30/2017 Ok to refill?

## 2017-10-20 ENCOUNTER — Other Ambulatory Visit: Payer: Self-pay

## 2017-10-20 MED ORDER — BASAGLAR KWIKPEN 100 UNIT/ML ~~LOC~~ SOPN: PEN_INJECTOR | SUBCUTANEOUS | 3 refills | Status: DC

## 2017-10-24 ENCOUNTER — Encounter: Payer: Self-pay | Admitting: Physician Assistant

## 2017-11-23 ENCOUNTER — Encounter: Payer: Self-pay | Admitting: Physician Assistant

## 2017-11-23 ENCOUNTER — Ambulatory Visit: Payer: 59 | Admitting: Physician Assistant

## 2017-11-23 ENCOUNTER — Other Ambulatory Visit: Payer: Self-pay

## 2017-11-23 VITALS — BP 144/90 | HR 75 | Temp 97.7°F | Resp 14 | Ht 63.0 in | Wt 252.6 lb

## 2017-11-23 DIAGNOSIS — IMO0001 Reserved for inherently not codable concepts without codable children: Secondary | ICD-10-CM

## 2017-11-23 DIAGNOSIS — E785 Hyperlipidemia, unspecified: Secondary | ICD-10-CM

## 2017-11-23 DIAGNOSIS — K219 Gastro-esophageal reflux disease without esophagitis: Secondary | ICD-10-CM

## 2017-11-23 DIAGNOSIS — G4700 Insomnia, unspecified: Secondary | ICD-10-CM

## 2017-11-23 DIAGNOSIS — E1165 Type 2 diabetes mellitus with hyperglycemia: Secondary | ICD-10-CM

## 2017-11-23 DIAGNOSIS — I10 Essential (primary) hypertension: Secondary | ICD-10-CM | POA: Diagnosis not present

## 2017-11-23 DIAGNOSIS — E559 Vitamin D deficiency, unspecified: Secondary | ICD-10-CM

## 2017-11-23 DIAGNOSIS — F419 Anxiety disorder, unspecified: Secondary | ICD-10-CM

## 2017-11-23 NOTE — Progress Notes (Signed)
[name] MRN: [id number], DOB: Jan 06, 1961, 57 y.o. Date of Encounter: _0 @  Chief Complaint:  Chief Complaint  Patient presents with  . Follow-up    3 mos    HPI: 57 y.o. year old white female  Presents for follow-up office visit.   She saw me on 09/19/13 as a new patient to establish care. She said that she had been going to [facility] on [facility] in [place]. Said that she had been going there for 30 years. Michela Pitcher that they actually dismissed her (but did not state the reason why).  Michela Pitcher that her last office visit and last labs there were about 3 months prior to her visit here on 09/19/2013. She said that she went there for office visits every 3 months.  However said that she was out of all of her medications except for her diabetes and blood pressure medicines.  Stated that she was seeing no other medical providers except for that family practice. Has seen no gynecologist. Has had no mammogram or colonoscopy or other preventive care.  At that initial visit, she was not fasting so could not check lipid panel. At that visit we just checked CMET and A1c. CMET revealed elevated LFTs. Also showed A1c elevated at 8.9. At that time I said to stop the Crestor and also to find out if she was taking any Tylenol or drinking any alcohol.  At f/u OV 11/05/13  she said that she does not remember getting a message to stop the Crestor. Said that she was still taking Crestor. Said that she does not use any Tylenol and does not drink any type of alcohol. At the time of that lab result also said to increase metformin from 500 twice a day to 1000 mg twice a day. She said that she did make this change. Also said to add Actos 45 mg daily. She says that she did add this medication as well. As it turns out, when we rechecked LFTs on 11/05/13-- they were normal. This was on Crestor and we verified that she was on Crestor at the time of these LFTs were normal.  Therefore  we told her to just go ahead and stay on the Crestor and we will monitor LFTs. Today she reports that she is still on the Crestor.  She says that when she checks her blood sugar she always checks it fasting morning. Says that she checks it about once a week.  She did not bring in her blood sugar log with her today.  She had follow-up office visit with me on 11/05/2013 for complete physical exam.  Today she reports that she is taking the Crestor and has been on this this entire time. She is taking other medications as directed as well. Also verified that she is taking aspirin which was added at prior visit with me.  At 06/11/13 OV she reports that she only occasionally checks BS. Did not bring log.  I reveiwed last labs I did here. 03/11/14 A1C was 7.4---said to add Januvia 44m--she says she did add this and IS taking this daily.   At office visit 09/23/14 I reviewed the fact that at her initial visit with me she had told me that in the past, she had been on Zoloft 200 mg but at the time of that initial visit with me she was off Zoloft completely but was needing to resume it. At that time we started Zoloft at 50 mg with plans  to gradually titrate up the dose. However, thus far the 50 mg dose has been continued. Discussed this with her today and she states that the 50 mg dose seems to be working well for her and she does not feel that she needs to increase the dose. Is controlling her symptoms well as causing no adverse effects.  At visit 09/23/14 she does not bring in any type of blood sugar log or blood sugar reading reports. States that she is taking medications as directed. She has been educated regarding proper diet and exercise but is noncompliant with this.  09/23/2014 lab results showed  --LDL had increased significantly. Past labs that showed LDL 64 and 73 the current LDL at that time was 192. At f/u OV 12/25/14-- she states that "sometimes forgets to take Crestor ". At [place] 12/25/14-- I discussed  the above lab results and the fact that cholesterol is severely elevated without medication but well controlled with medication and the necessity for her to take Crestor on a daily basis. She voices understanding and agrees. ---Also at lab vitamin D was very low and she was told to take prescription strength weekly for 12 weeks then take 4000 units daily. At [place] 12/25/14--States that she is taking this as directed and I reminded her about using the 4000 units daily when she completes the prescription. --Also at lab A1c was 8.0. For her to continue all current medications and also add Glucotrol XL 10 mg daily. 12/25/14-- she states that she is taking this as directed.  At [place] --12/25/2014--- she did bring in paperwork she has been writing sugar readings.  States that she only checks fasting morning readings. She has the following readings: She has 36 readings----none of these are in the 90s. 3 of these are in the 100s. 5 of these are in the 110s. 5 are these are in the 120s.  She has a few readings less than 90--- one reading that's an 24, one that is 69, 1 that is 82, 1 that is 88. She has 4 readings that are higher at 136, 167, 172, 165  At OV 04/02/15: She states that she has stopped Ambien. Says that her family told her that she was doing "crazy things "at night after taking that medicine. States that she does not remember doing and has no recollection of those events. Patient states that she is taking her Crestor now. However she is not fasting today. Says that she drank a "root beer" this morning and had forgotten about needing to be fasting. Also asked about what dose of vitamin D she is currently taking. Reviewed that at lab 09/23/14 she was to do the 50,000 units weekly for 12 weeks then 4000 units daily. She does not seem certain about exactly what she is taking but says that it is prescription. She did not bring in any blood sugars today for me to review. She states that she is taking all medicines  as directed and having no adverse effects other than what is stated about Ambien above.  10/16/2015: Reviewed that at last office visit-- 07/10/15-- A1c was checked and came back 8.8. At that time-- recommended to start Invokana 300 mg daily. Also told patient that if A1c not at goal at recheck with have to add insulin. Told her to be very strict with her carb intake and addition to taking all of the medications. Today she states that she has been taking the Invokana daily. Also reviewed that at the time of her  last lipid panel June 2016 LDL came back 192 and at that time she reported that she had not been taking her Crestor every day. Followed up that issue at other OVs since then. Followed it up again today and she states that she is taking the Crestor every day. She is taking all medications as directed. However did review that her blood pressure is high today. She says that she usually takes her medicines in the morning but because she did not eat much today she was going to wait and take medicines with her dinner tonight. Discussed need to take medications at same time every day. As well when we further discussed whether she was fasting-- she is not. So, cannot check FLP.  01/19/2016: She has no complaints or concerns today. She is taking blood pressure medications as directed. No lightheadedness. Initially she had no complaints but then once I mention her blood pressure being somewhat high again today, as it was at last visit, she says that she has not been getting much sleep and asked if that could be affecting her high blood pressure. Says that the Remeron has no effect. She takes it but doesn't notice any difference. Says that she is getting very little sleep. I asked if she is napping during the day but she says no she does not nap. Says that she checks her blood sugar a couple of times a week fasting morning and gets usually around 130 - 140 - 150. However says that this morning it was  230. She is taking diabetic medicines as directed with no adverse effects. She is fasting today. She is taking cholesterol medication as directed with no adverse effects. Current dose of Zoloft is still working in controlling her anxiety and depression. Causing no adverse effects.  04/29/2016: Says she checks her BS most mornings. Gets ~ 150. She is taking all diabetic meds as directed. No adv effects.  She is taking statin as directed. No myalgias or other adv effects.  She is taking Zoloft daily. This is controlling her anxiety and depression. Is causing no adv effects.   07/28/2016: Reviewed that at lab 04/29/16-- A1c came back at 8.6 and had her add Trulicity. Today she reports that she did add the Trulicity and gives this every Monday. Says that she is having no adverse effects.  Says that she checks fasting blood sugar most mornings and gets around 160. She has no concerns to address today. She is taking all diabetic meds as directed. No adv effects.  She is taking statin as directed. No myalgias or other adv effects.  She is taking Zoloft daily. This is controlling her anxiety and depression. Is causing no adv effects.    11/04/2016: She reports that she checks fasting morning blood sugar about 2-3 times per week. Gets readings of "around 127 or 147 somewhere in that area." Noted at that at the last set of labs I had stated to make sure she was taking her Crestor and vitamin D daily and reviewed this again with her today. She reassures me that she is taking the Crestor (and vitamin D) on a daily basis and not skipping on this. She has no concerns to address today. She is taking all diabetic meds as directed. No adv effects.  She is taking statin as directed. No myalgias or other adv effects.  She is taking Zoloft daily. This is controlling her anxiety and depression. Is causing no adv effects.    12/15/2016: At last office visit  11/04/16 A1c came back at 7.8.  At that time I noted that  she was on maximum dose of 5 different types of diabetic medications.  A1c still not controlled.  Recommended for her to come in for follow-up visit to discuss medication change/insulin. Patient now presents for that follow-up visit.  A/P AT THAT OV-----12/15/2016: Uncontrolled diabetes mellitus type 2 without complications, unspecified whether long term insulin use ([place]) At this time she is to stop all of the following medications:  Invokana, Trulicity, Glucotrol,  Januvia, Actos.  She reports that she had stopped the metformin secondary to nausea and GI upset. Told her that we will restart metformin at low dose. She will take 500 mg once daily for several days. If she is having no nausea or other GI side effects, then she can go up to taking this twice daily.  She will administer the Lantus as follows: She will give 10 units tonight. She will give 15 units tomorrow night (tomorrow will take no oral medication except for metformin 500 mg) Then she will increase Lantus by 1 unit each night until fasting morning sugars are in the 100-150 range.  She is to document fasting morning sugars every morning and bring this to her next OV. Today I gave her a blood sugar log form and showed her how to fill this out--- she is to document the number of units of insulin given each evening and document fasting morning sugars each morning. She will schedule follow-up visit with me in approximately 10 days. I have given her savings card for Lantus and she should have $0 co-pay. If she gets home and has any questions or concerns, she can call me at any time in the interim. Otherwise follow-up visit approximate 10 days.  Note--her son has Type 1 Diabetes and pt has been on Trulicity so she has understanding of adjusting Insulin and she has experience with using pen.  - Insulin Glargine (LANTUS SOLOSTAR) 100 UNIT/ML Solostar Pen; Administer daily as directed----up to 30 units daily  Dispense: 5 pen; Refill:  PRN - metFORMIN (GLUCOPHAGE) 500 MG tablet; Take 1 tablet (500 mg total) by mouth 2 (two) times daily with a meal.  Dispense: 60 tablet; Refill: 3     01/05/2017: Today she reports that she is taking the metformin 1 by mouth twice a day. Says that it is causing a little bit of abdominal discomfort but not much.  Today she does bring her blood sugar log form and has been filling this incorrectly. She did give the 10 units on that first night after last visit and then did increase to the 15 units the following night. She has been increasing by 1 unit each night since then. However her fasting blood sugars are not coming down much.  She is now up to giving 37 units of insulin last night.  Fasting morning blood sugars have been as follows ever since her last visit daily morning sugars have been: 173, 210, 155, 196, 180, 190, 228, 225, 202, 200, 210, 234, 200, 210, 225, 241, 220, 221, 194, 240, 230, 236  A/P AT OV 01/05/2017: Uncontrolled diabetes mellitus type 2 without complications, unspecified whether long term insulin use ([facility])  Continue the metformin 500 mg twice a day. If abdominal/GI side effects worsen then call and notify me. Regarding her insulin dosing she is to do the following: Tonight give 45 units. Then increase by 5 units each night until her fasting blood sugars get down to around 180 Then  increase by 1 unit each evening until fasting sugars get to around 100-150. I discussed this with her and she voices understanding. Skin is that we definitely do not want blood sugar to get too low so deftly do not want to give too much insulin. She voices understanding and agrees.  She is to have follow-up visit in one week. Follow-up sooner if needed. Call if has any questions or concerns about dosing.   01/12/2017: Today she states that she is still tolerating the metformin 500 mg twice a day. Today she states that she did increase the insulin by 5 units at a time some nights and then  by 1 unit at a time some--- as directed at last visit. She does bring in blood sugar log form for me to review. October 1 ----36 units-----240 October 2----37 units------ 230 October 3--- 45 units------ 236 October 4----50 units-------184-- October 5----55 units-------- 176 October 6-----60 units------- 200 October 7------ 61 units-------- 179 October 8------ 62 units-------- 201 October 9--------- 65 units------- blood sugar this morning October 10 was 180  She states that I had given her a savings card to use for her insulin so she has $0 co-pay.   01/19/2017:  Today she brings in blood sugar log form. Has the following information documented. Says that the insulin pen will only go to 80 units--- says it will not doll beyond 80. Therefore has stopped at the 80 units.  October 10----75 units----the following morning blood sugar 191 October 11-----75 units----------------------------------------------- 168 October 12--------------- 75 units-------------------------------------- 176 October 13-----------75 units----------------------------------- 188 October 14----------- 75 units------------------------------ 195 October 15-----------75 units------------------------------186 October 16-------- 80 units-------------------------------------- 188   02/03/2017:  Today she does bring in her blood sugar log form. October 17-----85 units------------------- 187 October 18------ 90 units---------------- 191 October 19----95 units-199 October 20-----100 units------------ 200 October 21 ----110 units ------------- 210   October 22-----115 units------------- 205 October 23-----115 units------------ 189 October 24--------- 120 units------ 157 October 25--------- 120 units------- 149 October 26------------ 120 units------ 199 October 27------------ 120 units-----125 October 28----------- 120 units-------- 104 October 29------------ 120 units---------- 169 October 30------------ 120  units----------- 137 October 31---------- gave 120 units--- morning of November 1--- 157  In addition to the insulin she is taking her metformin and is tolerating this with no GI side effects at current dose. Today she reports that she has been taking blood pressure medications as directed. Having no lightheadedness or other adverse effects. She is taking her Crestor. No myalgias or other adverse effects. She has no specific concerns to address today.   05/19/2017: Today she brings in blood sugar log form.  Fasting readings.  States that she has been giving 120 units of insulin once daily. She has readings documented for every morning.  The readings so far for the month of February all range between 151 - 214.  Except there is one reading that was 120. Continues to also take the metformin and is tolerating this with no adverse effects. Today she reports that she has been taking blood pressure medications as directed. Having no lightheadedness or other adverse effects. She is taking her Crestor. No myalgias or other adverse effects. She has no specific concerns to address today.   08/22/2017:  Today she does bring in blood sugar log form for me.   Reports that she currently is giving insulin at 165 units once daily.   She has daily blood sugars documented.   Blood sugar readings dated May 1 through May 18 are: 91, 101, 127, 137, 114, 109,  107, 107, 105, 102, 109, 105, 160, 75, 76, 91, 149  She does continue to take the metformin and is having no adverse effects with this. She is taking blood pressure medications as directed.  Having no lightheadedness or other adverse effects. She is taking Crestor daily and this is causing no myalgias or other adverse effects. She has no specific concerns to address today.    11/23/2017: I reviewed that at her last visit/last lab I had recommended to add Trulicity.  Reviewed that her fasting blood sugars were good but her A1c was elevated so is trying  to control postprandial sugars by adding Trulicity.  However, today patient states "insurance did not cover the Trulicity ".  Did not add Trulicity. She is taking all other medications as directed. She brings in blood sugar readings that are all fastings.  She has some documented for daily ever since last visit.  All blood sugar readings are good. For the month of August but fasting sugars are: 82, 144, 201, 126, 146, 128, 124, 133, 85, 105, 156, 137, 156, 166, 145, 178, 154, 137, 108, 113, 122.  I also reviewed the blood pressure reading is slightly high today at 144/90.  However reviewed that at last visit it was on the low end at 116/80.  She states that she has not yet taken her blood pressure pill yet this morning.  She is taking metformin and insulin as directed. She is taking the lisinopril HCT  daily.  Having no lightheadedness or other adverse effects. She is taking Crestor daily.  Having no myalgias or other adverse effects.     Past Medical History:  Diagnosis Date  . Allergy   . Anxiety 2013  . Asthma 2015  . Depression 2013  . Diabetes mellitus 2004  . GERD (gastroesophageal reflux disease) 2013  . Hyperlipidemia 1992  . Hypertension 1992  . Memory impairment      Home Meds: Outpatient Medications Prior to Visit  Medication Sig Dispense Refill  . aspirin 81 MG tablet Take 1 tablet (81 mg total) by mouth daily. 30 tablet 11  . blood glucose meter kit and supplies KIT Inject 1 each into the skin as directed. Dispense based on patient and insurance preference. Check blood sugar fasting each morning.  ICD E11.65 1 each 0  . Blood Glucose Monitoring Suppl (FREESTYLE LITE) DEVI UAD.E11.65  0  . [facility] D3 2000 units CAPS Take 2 capsules (4,000 Units total) by mouth daily. 30 each 3  . glucose blood test strip Dispense based on patient and insurance preference.  Check blood sugar fasting each morning.  ICD E11.65 50 each 12  . Insulin Glargine (BASAGLAR KWIKPEN) 100 UNIT/ML SOPN  Inject up to 200 units subcutaneous daily 10 pen 3  . Insulin Pen Needle 32G X 4 MM MISC Use with insulin qd 100 each 3  . Lancets MISC Dispense based on patient and insurance preference.  Check blood sugar fasting each morning.  ICD E11.65 100 each 3  . lisinopril-hydrochlorothiazide (PRINZIDE,ZESTORETIC) 20-12.5 MG tablet TAKE 1 TABLET BY MOUTH DAILY. 90 tablet 3  . LORazepam (ATIVAN) 1 MG tablet TAKE 1 TABLET BY MOUTH TWICE A DAY 60 tablet 2  . metFORMIN (GLUCOPHAGE) 500 MG tablet TAKE 1 TABLET (500 MG TOTAL) BY MOUTH 2 (TWO) TIMES DAILY WITH A MEAL. 60 tablet 3  . mirtazapine (REMERON) 30 MG tablet TAKE 1 TABLET AT BEDTIME 30 tablet 5  . rosuvastatin (CRESTOR) 40 MG tablet TAKE 1 TABLET (40 MG  TOTAL) BY MOUTH DAILY. 30 tablet 4  . Dulaglutide (TRULICITY) 1.5 VQ/2.5ZD SOPN Inject into skin subcutaneous weekly (Patient not taking: Reported on 11/23/2017) 4 pen 3   No facility-administered medications prior to visit.     Allergies:  Allergies  Allergen Reactions  . Contrast Media [Iodinated Diagnostic Agents] Anaphylaxis  . Ambien [Zolpidem Tartrate] Other (See Comments)    Family has told her that after taking Ambien, she does "crazy things" that she does not remember doing.   . Codeine Nausea And Vomiting    Social History   Socioeconomic History  . Marital status: Married    Spouse name: Not on file  . Number of children: Not on file  . Years of education: Not on file  . Highest education level: Not on file  Occupational History  . Not on file  Social Needs  . Financial resource strain: Not on file  . Food insecurity:    Worry: Not on file    Inability: Not on file  . Transportation needs:    Medical: Not on file    Non-medical: Not on file  Tobacco Use  . Smoking status: Never Smoker  . Smokeless tobacco: Never Used  Substance and Sexual Activity  . Alcohol use: No  . Drug use: No  . Sexual activity: Yes  Lifestyle  . Physical activity:    Days per week: Not on  file    Minutes per session: Not on file  . Stress: Not on file  Relationships  . Social connections:    Talks on phone: Not on file    Gets together: Not on file    Attends religious service: Not on file    Active member of club or organization: Not on file    Attends meetings of clubs or organizations: Not on file    Relationship status: Not on file  . Intimate partner violence:    Fear of current or ex partner: Not on file    Emotionally abused: Not on file    Physically abused: Not on file    Forced sexual activity: Not on file  Other Topics Concern  . Not on file  Social History Narrative   Married.    Lives with husband and youngest son, who is 56 y/o.   Has 4 children.   Does not work outside of home.    No formal exercise. Only activity around the house.   Tries to be careful with Diabetic diet.    Family History  Problem Relation Age of Onset  . Cancer Father        Lymphoma, Leukemia  . Depression Father   . Cancer Maternal Grandfather   . Cancer Paternal Grandmother   . Diabetes Paternal Grandfather   . Kidney disease Paternal Grandfather      Review of Systems:  See HPI for pertinent ROS. All other ROS negative.    Physical Exam: Blood pressure (!) 144/90, pulse 75, temperature 97.7 F (36.5 C), temperature source Oral, resp. rate 14, height _0  (1.6 m), weight 114.6 kg, SpO2 94 %., Body mass index is 44.75 kg/m. General: Obese WF. Appears in no acute distress. Neck: Supple. No thyromegaly. No lymphadenopathy. No carotid bruits. Lungs: Clear bilaterally to auscultation without wheezes, rales, or rhonchi. Breathing is unlabored. Heart: RRR with S1 S2. No murmurs, rubs, or gallops. Abdomen: Soft, non-tender, non-distended with normoactive bowel sounds. No hepatomegaly. No rebound/guarding. No obvious abdominal masses. Musculoskeletal:  Strength and tone normal for age. Extremities/Skin:  Warm and dry.  No edema.  Neuro: Alert and oriented X 3. Moves all  extremities spontaneously. Gait is normal. CNII-XII grossly in tact. Psych:  Responds to questions appropriately with a normal affect.   Diabetic foot exam: Inspection is normal bilaterally.  Sensation is intact throughout bilaterally.  DP and posterior tibial pulses are 1+ bilaterally.  ASSESSMENT AND PLAN:  57 y.o. year old female with    Type 2 diabetes mellitus with hyperglycemia  On ACE inhibitor On statin On ASA 81 mg daily  Micro albumin was done 11/05/2013, 12/25/2014, 04/29/2016, 08/22/2017  She reports that she does have annual diabetic eye exam -Diabetic Foot Exam is good  02/03/2017: She will continue the insulin at 120 units and continue to monitor fasting sugars and then adjust accordingly. She will continue current dose of metformin. A1c to monitor. 05/19/2017:  She is to titrate up insulin.  Discussed that goal fasting sugar needs to be down between 100 - 150.  Continue metformin. 08/22/2017: She reports that she is now giving insulin at 165 units daily.  She does bring in blood sugar log form today which looks good.  See HPI for details.  Check A1c today to make sure this is consistent with her home readings.  Also due to check microalbumin.  Continue metformin and insulin. 11/23/2017: Reviewed her labs from 08/22/2017 I had recommended to add Trulicity.  Was having good fasting blood sugars but elevated A1c indicating elevated postprandial readings trying to control postprandial blood sugar with Trulicity.  However today she reports that "insurance would not cover Trulicity so did not add that ".  I will have staff look into seeing if her insurance does cover another medication in that class.  Also will recheck A1c today to monitor.  Hyperlipidemia She is taking Crestor. 11/03/2016--she assures me that she is taking the Crestor daily. She had FLP/LFT at last visit 07/2016 6 can wait to recheck this. 02/03/2017: She is fasting. We'll check lab today to monitor.  05/19/2017: She had FLP  LFT 02/03/17.  Continue Crestor. 08/22/2017: She is fasting today.  Recheck FLP/LFT.  On Crestor. 11/23/2017: She had FLP/LFT 08/22/2017.  Continue Crestor.  Essential hypertension BP was slightly elevated at visit 10/2015 and is slightly elevated again 01/2016. At that visit lisinopril was increased from 10/12.5 up to 20/12.5. Last Potassium was good at 3.9. 04/29/2016: BP at goal. Cont current meds. BMET normal 01/2016.  07/28/2016: Blood Pressure at goal. Continue current medications. Check lab to monitor. 05/19/2017:  blood pressure is at goal. Continue current medications.  Check lab to monitor. 08/22/2017: Blood Pressure is at goal.  On ACE inhibitor.  Check labs to monitor. 11/23/2017: Pressure borderline high today.  However was low at last visit.  Says that she has not had her medication yet this morning.  Continue current treatment.  On ACE inhibitor.  Vitamin D deficiency On labs 11/05/13 vitamin D was low at 23. Currently she is on vitamin D. I told her to start taking vitamin D over-the-counter 2000 units daily. Recheck Vitamin D at lab 09/23/2014--I am indeed very low. She was told to take prescription 50,000 units weekly for 12 weeks and when she completes this she is to take over-the-counter vitamin D 4000 units daily. At [place] 12/25/14 she states that she is taking vitamin D as directed and reminded her to take the over-the-counter supplements when she completes the prescription strength. She voices understanding and agrees. At [place] 04/02/15 she does not seem certain about what  dose of vitamin D she is taking currently. We'll recheck vitamin D level now. 04/29/2016: Cont current dose Vit D 07/28/2016: Last vitamin D level was checked remotely ( > 1 year ago). Will recheck vitamin D level today. 11/03/16 she is to continue her vitamin D and make sure to take daily. Just checked level 4/18 6 can wait to recheck lab. 02/03/2017: Reviewed that her last vitamin D level was low when this was checked 07/2016.  We'll recheck lab today to monitor. 05/19/2017: Vitamin D level was within normal range at lab 02/03/17.  Continue current dose of vitamin D. 08/22/2017: Lab 02/2017 vitamin D was at 34.  Continue current dose vitamin D. 11/23/2017: Continue current dose vitamin D.   Asthma, mild intermittent, uncomplicated 04/23/4172:  This is stable and controlled.  Gastroesophageal reflux disease, esophagitis presence not specified 11/23/2017:  This is stable and controlled.  Anxiety She was on Zoloft 200 mg in past, prior to coming to our office.  At [place] 09/19/2013 discussed: Need to start this back at low dose at 50 mg for couple weeks. Then can increase to 100 mg and then slowly will titrate back up as needed.  At [place] 09/23/2014---- reviewed with patient that she has continued on the above medications since then. She states that current medications are controlling her symptoms well. She is not feeling anxious or depressed with using Zoloft at 50 mg. Wants to continue current dose and current treatment.  08/22/2017:   This remains stable and controlled on current medications.   Depression See # 7 above. 08/22/2017:   This remains stable and controlled on current medications.  Insomnia At visit 04/02/15 she reports that Ambien has been causing her to do crazy things during the night that she does not recall doing. Therefore she has stopped the Ambien. At visit 04/02/15 prescribed Remeron 30 mg QHS. Told her to follow-up if this causes adverse effects or is not effective. At visit 01/2016 she states that the Remeron is ineffective. At visit changed her to [place].  She had CPE with me 2015 At ROVs since then, I have tried to update preventive care--such as at one OV I ordered mammogram--she was agreeable--but then she did not go for this---will discuss scheduling another CPE at next OV 02/03/2017----reviewed that she does have a lot of preventive care that is over due. Today I specifically did review that last  year after placed order for mammogram but she did not go. Scars this with her today. She states that she will go this time for me to go ahead and order mammogram.  I have placed order for mammogram today.  Also today I am checking screening HIV and hepatitis C labs. At next visit I will discuss having her schedule a CPE to update additional preventive care.   Routine follow-up office visit here in 3 months or follow-up sooner if needed.   [address] [place], [place], [facility] 11/23/2017 8:33 AM

## 2017-11-24 LAB — HEMOGLOBIN A1C
Hgb A1c MFr Bld: 8.1 % of total Hgb — ABNORMAL HIGH (ref <5.7)
Mean Plasma Glucose: 186 (calc)
eAG (mmol/L): 10.3 (calc)

## 2017-11-30 ENCOUNTER — Other Ambulatory Visit: Payer: Self-pay | Admitting: Physician Assistant

## 2017-12-09 ENCOUNTER — Telehealth: Payer: Self-pay

## 2017-12-09 NOTE — Telephone Encounter (Signed)
Patient called and states her insurance will cover Jardiance. Would you like her to try this?

## 2017-12-14 NOTE — Telephone Encounter (Signed)
Call placed to patient insurance spoke with Westley Hummer in the Prior Authorization department  and she  Stated Bydureon  51.49 for 1 pen   Byetta  and Victoza 2 pack $75 are all  covered

## 2017-12-14 NOTE — Telephone Encounter (Signed)
I reviewed chart.  At time of 08/22/2017 labs-- I had recommended to add Trulicity but that was not covered by her insurance -- so I then planned to see if her insurance would cover another medication in that class.  Need to find out if insurance will cover another medication in that same class as Trulicity.  Would prefer that class versus Jardiance.

## 2017-12-15 ENCOUNTER — Other Ambulatory Visit: Payer: Self-pay | Admitting: Physician Assistant

## 2017-12-17 ENCOUNTER — Other Ambulatory Visit: Payer: Self-pay | Admitting: Physician Assistant

## 2017-12-17 DIAGNOSIS — E1165 Type 2 diabetes mellitus with hyperglycemia: Principal | ICD-10-CM

## 2017-12-17 DIAGNOSIS — IMO0001 Reserved for inherently not codable concepts without codable children: Secondary | ICD-10-CM

## 2017-12-23 ENCOUNTER — Encounter: Payer: Self-pay | Admitting: Physician Assistant

## 2017-12-23 DIAGNOSIS — Z23 Encounter for immunization: Secondary | ICD-10-CM | POA: Diagnosis not present

## 2017-12-23 NOTE — Telephone Encounter (Signed)
Patient  insurance will cover  Victoza and [employer] ElectricByette

## 2017-12-26 NOTE — Telephone Encounter (Signed)
Victoza------ start 0.6 mg subcu daily for 1 week then increase to 1.2 mg subcu daily for 1 week and then increase to 1.8 mg subcu daily.  Dispense # QS for 1 month supply with 5 refills.

## 2017-12-29 ENCOUNTER — Other Ambulatory Visit: Payer: Self-pay | Admitting: Physician Assistant

## 2017-12-29 DIAGNOSIS — I10 Essential (primary) hypertension: Secondary | ICD-10-CM

## 2018-01-03 NOTE — Telephone Encounter (Signed)
Call placed to patient she was not available by phone and does not have a voicemail.I will try again later

## 2018-01-05 ENCOUNTER — Other Ambulatory Visit: Payer: Self-pay | Admitting: Physician Assistant

## 2018-01-05 NOTE — Telephone Encounter (Signed)
Last OV 11/23/2017 Last refill 10/05/2017 Ok to refill?

## 2018-01-08 ENCOUNTER — Other Ambulatory Visit: Payer: Self-pay | Admitting: Physician Assistant

## 2018-01-26 MED ORDER — LIRAGLUTIDE 18 MG/3ML ~~LOC~~ SOPN: PEN_INJECTOR | SUBCUTANEOUS | 5 refills | Status: DC

## 2018-01-26 NOTE — Telephone Encounter (Signed)
Call was placed to patient on 10/23 and no answer I have sent the rx to patient local pharmacy as well as sent her a message on My Chart

## 2018-01-31 ENCOUNTER — Other Ambulatory Visit: Payer: Self-pay | Admitting: Physician Assistant

## 2018-02-04 ENCOUNTER — Other Ambulatory Visit: Payer: Self-pay | Admitting: Physician Assistant

## 2018-02-27 ENCOUNTER — Ambulatory Visit: Payer: 59 | Admitting: Physician Assistant

## 2018-02-28 ENCOUNTER — Ambulatory Visit: Payer: 59 | Admitting: Family Medicine

## 2018-02-28 ENCOUNTER — Encounter: Payer: Self-pay | Admitting: Family Medicine

## 2018-02-28 VITALS — BP 122/82 | HR 67 | Temp 97.6°F | Resp 16 | Ht 63.0 in | Wt 257.2 lb

## 2018-02-28 DIAGNOSIS — E785 Hyperlipidemia, unspecified: Secondary | ICD-10-CM

## 2018-02-28 DIAGNOSIS — I10 Essential (primary) hypertension: Secondary | ICD-10-CM | POA: Diagnosis not present

## 2018-02-28 DIAGNOSIS — E1165 Type 2 diabetes mellitus with hyperglycemia: Secondary | ICD-10-CM

## 2018-02-28 DIAGNOSIS — IMO0001 Reserved for inherently not codable concepts without codable children: Secondary | ICD-10-CM

## 2018-02-28 DIAGNOSIS — Z794 Long term (current) use of insulin: Secondary | ICD-10-CM | POA: Diagnosis not present

## 2018-02-28 DIAGNOSIS — Z6841 Body Mass Index (BMI) 40.0 and over, adult: Secondary | ICD-10-CM

## 2018-02-28 NOTE — Patient Instructions (Signed)
Adjust your insulin based on your fasting sugars  If under 80 in the morning, decrease your dose by 5 units - and keep that dose for the next 3-7 days before adjusting again.  Recheck in 1 months with meter.

## 2018-02-28 NOTE — Progress Notes (Signed)
[name], female    DOB: [birth year], 57 y.o.   MRN: [id number]  PCP: Orlena Sheldon, PA-C  Chief Complaint  Patient presents with  . Diabetes    Patient is fasting today  . Hypertension    Subjective:   Alicia Pope is a 57 y.o. female, presents to clinic with CC of DM and HTN, here for routine follow up. Meds were recently changed for DM.  Her PCP about a month ago dc'd jardiance because it was not covered by insurance and started titration of victoza.  Pt has been doing injections, is at 1.8 mg daily, and has no side effects.  She has difficulty taking metformin 500 mg twice a day it causes stomach pain, cramping and constant diarrhea.  She is compliant with Basaglar insulin injection 200 units at 5 PM.  She does bring in with her a log of blood sugars and has some lows that make her feel symptomatic when she wakes up - sx of jitteriness, shakey, weak.  Morning fasting sugars in 60-70's are usually symptomatic.  Her fasting sugars over the past two months have lowered and are usually 70-120. She is concerned with cancer warning on medicine on victoza - family hx of grandmothers with colon cancer, breast cancer and father with leukemia.  No family hx of thyroid disease. Here PCP is unfortunately no longer with the practice, patient will be transferring to a different PCP in our same practice.  Patient states that she has never been to endocrinology. Scrolling through the history of medications on the chart patient has been on higher doses of metformin in the past, has been on Trulicity, Januvia, glipizide, Actos, Invokana, Lantus, Basaglar.  Currently on Victoza 1.8 daily, Metformin 500 mg BID, and Basaglar 200 units daily at 5pm.    She denies being on mealtime insuline or sliding scale.   When she was increasing basaglar dose with PCP they adjusted by 5 units for several days at a time.  She has not tried to lower dose at all with some of the low blood sugar  readings w/sx in the mornings.  Pt is taking statin and baby aspirin, eye exam per chart is overdue, will be due for foot exam per EMR (will need to check through past OV records because with her PCP they are often done and documented in note, but not checked off on HM tabs).  HTN -  BP well controlled, states she is compliant with the lisinopril hydrochlorothiazide, no side effects  Hyperlipidemia -on Crestor 40 mg, tolerating medication without any side effects, states she is compliant with this.  History of insomnia and anxiety, has been prescribed Ativan and Remeron, she states Remeron does not help that much with sleep she does use Ativan sparingly, usually for sleep or for panic attacks  Obesity -patient notes that she has been gaining weight gradually as she has adjusted diabetes medications over the past year.  Has gained almost 20 pounds since last November.  Wt Readings from Last 5 Encounters:  02/28/18 257 lb 4 oz (116.7 kg)  11/23/17 252 lb 9.6 oz (114.6 kg)  08/22/17 248 lb 3.2 oz (112.6 kg)  05/19/17 243 lb 9.6 oz (110.5 kg)  02/03/17 236 lb 3.2 oz (107.1 kg)     Patient Active Problem List   Diagnosis Date Noted  . Diabetes mellitus type 2, uncontrolled, without complications ([place]) 84/53/6468  . Insomnia 04/02/2015  . Obesity 09/23/2014  . Vitamin D  deficiency 09/23/2014  . Screening for colorectal cancer 09/23/2014  . Asthma   . Anxiety   . Depression   . Diabetes ([place])   . GERD (gastroesophageal reflux disease)   . Hypertension   . Hyperlipidemia     Current Meds  Medication Sig  . aspirin 81 MG tablet Take 1 tablet (81 mg total) by mouth daily.  . blood glucose meter kit and supplies KIT Inject 1 each into the skin as directed. Dispense based on patient and insurance preference. Check blood sugar fasting each morning.  ICD E11.65  . Blood Glucose Monitoring Suppl (FREESTYLE LITE) DEVI UAD.E11.65  . [employer] D3 2000 units CAPS Take 2 capsules (4,000 Units total)  by mouth daily.  . Dulaglutide (TRULICITY) 1.5 KT/6.2BW SOPN Inject into skin subcutaneous weekly  . glucose blood test strip Dispense based on patient and insurance preference.  Check blood sugar fasting each morning.  ICD E11.65  . Insulin Glargine (BASAGLAR KWIKPEN) 100 UNIT/ML SOPN INJECT UP TO 200 UNITS SUBCUTANEOUS DAILY  . Insulin Glargine (BASAGLAR KWIKPEN) 100 UNIT/ML SOPN INJECT UP TO 200 UNITS SUBCUTANEOUS DAILY  . Insulin Pen Needle (BD PEN NEEDLE NANO U/F) 32G X 4 MM MISC USE AS DIRECTED WITH INSULIN EVERY DAY  . Lancets MISC Dispense based on patient and insurance preference.  Check blood sugar fasting each morning.  ICD E11.65  . liraglutide (VICTOZA) 18 MG/3ML SOPN Start 0.13m subcutaneous daily for 1 week then increase to 1.29msubcutaneous daily for 1 week, then increase to 1.81m80mubcutaneous daily  . lisinopril-hydrochlorothiazide (PRINZIDE,ZESTORETIC) 20-12.5 MG tablet TAKE 1 TABLET BY MOUTH DAILY.  . LMarland KitchenRazepam (ATIVAN) 1 MG tablet TAKE 1 TABLET BY MOUTH TWICE A DAY  . metFORMIN (GLUCOPHAGE) 500 MG tablet TAKE 1 TABLET (500 MG TOTAL) BY MOUTH 2 (TWO) TIMES DAILY WITH A MEAL.  . mirtazapine (REMERON) 30 MG tablet TAKE 1 TABLET AT BEDTIME  . rosuvastatin (CRESTOR) 40 MG tablet TAKE 1 TABLET (40 MG TOTAL) BY MOUTH DAILY.     Review of Systems  Constitutional: Negative.   HENT: Negative.   Eyes: Negative.   Respiratory: Negative.   Cardiovascular: Negative.   Gastrointestinal: Negative.   Endocrine: Negative.   Genitourinary: Negative.   Musculoskeletal: Negative.   Skin: Negative.   Allergic/Immunologic: Negative.   Neurological: Negative.   Hematological: Negative.   Psychiatric/Behavioral: Negative.   All other systems reviewed and are negative.      Objective:    Vitals:   02/28/18 0808  BP: 122/82  Pulse: 67  Resp: 16  Temp: 97.6 F (36.4 C)  SpO2: 96%  Weight: 257 lb 4 oz (116.7 kg)  Height: 5' 3" (1.6 m)      Physical Exam  Constitutional: She  appears well-developed and well-nourished. No distress.  Obese female, well-appearing  HENT:  Head: Normocephalic and atraumatic.  Nose: Nose normal.  Mouth/Throat: Oropharynx is clear and moist.  Eyes: Pupils are equal, round, and reactive to light. Conjunctivae are normal. Right eye exhibits no discharge. Left eye exhibits no discharge. No scleral icterus.  Neck: No tracheal deviation present.  Cardiovascular: Normal rate, regular rhythm, normal heart sounds and intact distal pulses. Exam reveals no gallop and no friction rub.  No murmur heard. Pulmonary/Chest: Effort normal and breath sounds normal. No stridor. No respiratory distress. She has no wheezes. She has no rales. She exhibits no tenderness.  Abdominal: Soft. Bowel sounds are normal. She exhibits no distension. There is no tenderness.  Musculoskeletal: Normal range of motion.  Neurological: She is alert. She exhibits normal muscle tone.  Skin: Skin is warm and dry. No rash noted. She is not diaphoretic.  Psychiatric: She has a normal mood and affect. Her behavior is normal.  Nursing note and vitals reviewed.         Assessment & Plan:   57 year old female here for recheck of uncontrolled insulin dependent diabetes with recent med changes, also to recheck hyperlipidemia, obesity and hypertension   Problem List Items Addressed This Visit      Cardiovascular and Mediastinum   Hypertension    Blood pressure at goal today, well controlled, no side effects of medication, recheck renal function, continue current treatment 3 Month follow-up        Endocrine   Diabetes mellitus type 2, uncontrolled, without complications ([place])    Patient is not having side effects of Victoza, severe diarrhea with metformin, will change to extended release, she reports hypoglycemia in the mornings several times over the past month, is currently on Basaglar 200 units at night, will titrate with 5 unit increments of decrease with hypoglycemic  episodes, dose change only 1 time in 3 to 7 days before adjusting dose again.  Recheck patient in 1 month, instructed to bring glucometer with her  Do feel with uncontrolled diabetes with the patient's high dose of basal insulin, weight gain, and other chronic conditions she would likely benefit from diabetes referral.  I have not seen this patient before, and reviewing records she has tried almost every class of medications, and I am commented that she would benefit from specialist evaluation and from diabetes and nutritional referral      Relevant Medications   metFORMIN (GLUCOPHAGE-XR) 750 MG 24 hr tablet     Other   Hyperlipidemia    Compliant with medications, no side effects, no myalgias, recheck liver function and lipids Encouraged low-fat diet, calorie reduction, exercise as able to improve cholesterol      Relevant Orders   COMPLETE METABOLIC PANEL WITH GFR (Completed)   Lipid panel (Completed)   Referral to Nutrition and Diabetes Services   Obesity   Relevant Medications   metFORMIN (GLUCOPHAGE-XR) 750 MG 24 hr tablet   Other Relevant Orders   Ambulatory referral to Endocrinology   Referral to Nutrition and Diabetes Services    Other Visit Diagnoses    Uncontrolled type 2 diabetes mellitus without complication, with long-term current use of insulin ([place])    -  Primary   Relevant Medications   metFORMIN (GLUCOPHAGE-XR) 750 MG 24 hr tablet   Other Relevant Orders   COMPLETE METABOLIC PANEL WITH GFR (Completed)   Hemoglobin A1c (Completed)   Ambulatory referral to Endocrinology   Referral to Nutrition and Diabetes Services      Recheck in 1 month for diabetes medication changes, however may cancel if she establishes with endocrinology first  Routine recheck in 3 months for other chronic conditions.   Delsa Grana, PA-C 02/28/18 8:20 AM

## 2018-03-01 LAB — COMPLETE METABOLIC PANEL WITH GFR
AG RATIO: 1.7 (calc) (ref 1.0–2.5)
ALT: 31 U/L — AB (ref 6–29)
AST: 27 U/L (ref 10–35)
Albumin: 4.3 g/dL (ref 3.6–5.1)
Alkaline phosphatase (APISO): 61 U/L (ref 33–130)
BILIRUBIN TOTAL: 0.5 mg/dL (ref 0.2–1.2)
BUN: 8 mg/dL (ref 7–25)
CO2: 26 mmol/L (ref 20–32)
Calcium: 9.4 mg/dL (ref 8.6–10.4)
Chloride: 106 mmol/L (ref 98–110)
Creat: 0.51 mg/dL (ref 0.50–1.05)
GFR, EST AFRICAN AMERICAN: 124 mL/min/{1.73_m2} (ref >=60)
GFR, Est Non African American: 107 mL/min/{1.73_m2} (ref >=60)
Globulin: 2.5 g/dL (calc) (ref 1.9–3.7)
Glucose, Bld: 78 mg/dL (ref 65–99)
POTASSIUM: 3.7 mmol/L (ref 3.5–5.3)
Sodium: 141 mmol/L (ref 135–146)
Total Protein: 6.8 g/dL (ref 6.1–8.1)

## 2018-03-01 LAB — HEMOGLOBIN A1C
Hgb A1c MFr Bld: 7.7 % of total Hgb — ABNORMAL HIGH (ref <5.7)
MEAN PLASMA GLUCOSE: 174 (calc)
eAG (mmol/L): 9.7 (calc)

## 2018-03-01 LAB — LIPID PANEL
Cholesterol: 199 mg/dL (ref <200)
HDL: 60 mg/dL (ref >50)
LDL Cholesterol (Calc): 110 mg/dL (calc) — ABNORMAL HIGH
Non-HDL Cholesterol (Calc): 139 mg/dL (calc) — ABNORMAL HIGH (ref <130)
TRIGLYCERIDES: 176 mg/dL — AB (ref <150)
Total CHOL/HDL Ratio: 3.3 (calc) (ref <5.0)

## 2018-03-01 MED ORDER — METFORMIN HCL ER 750 MG PO TB24: 750.0000 mg | ORAL_TABLET | Freq: Every day | ORAL | 1 refills | Status: DC

## 2018-03-01 NOTE — Assessment & Plan Note (Signed)
Patient is not having side effects of Victoza, severe diarrhea with metformin, will change to extended release, she reports hypoglycemia in the mornings several times over the past month, is currently on Basaglar 200 units at night, will titrate with 5 unit increments of decrease with hypoglycemic episodes, dose change only 1 time in 3 to 7 days before adjusting dose again.  Recheck patient in 1 month, instructed to bring glucometer with her  Do feel with uncontrolled diabetes with the patient's high dose of basal insulin, weight gain, and other chronic conditions she would likely benefit from diabetes referral.  I have not seen this patient before, and reviewing records she has tried almost every class of medications, and I am commented that she would benefit from specialist evaluation and from diabetes and nutritional referral

## 2018-03-01 NOTE — Assessment & Plan Note (Signed)
Compliant with medications, no side effects, no myalgias, recheck liver function and lipids Encouraged low-fat diet, calorie reduction, exercise as able to improve cholesterol

## 2018-03-01 NOTE — Assessment & Plan Note (Signed)
Blood pressure at goal today, well controlled, no side effects of medication, recheck renal function, continue current treatment 3 Month follow-up

## 2018-03-15 ENCOUNTER — Encounter: Payer: 59 | Attending: Family Medicine | Admitting: *Deleted

## 2018-03-15 ENCOUNTER — Encounter: Payer: Self-pay | Admitting: Endocrinology

## 2018-03-15 ENCOUNTER — Ambulatory Visit (INDEPENDENT_AMBULATORY_CARE_PROVIDER_SITE_OTHER): Payer: 59 | Admitting: Endocrinology

## 2018-03-15 VITALS — BP 132/96 | HR 69 | Ht 63.0 in | Wt 249.2 lb

## 2018-03-15 DIAGNOSIS — Z794 Long term (current) use of insulin: Secondary | ICD-10-CM | POA: Diagnosis not present

## 2018-03-15 DIAGNOSIS — Z713 Dietary counseling and surveillance: Secondary | ICD-10-CM | POA: Insufficient documentation

## 2018-03-15 DIAGNOSIS — IMO0001 Reserved for inherently not codable concepts without codable children: Secondary | ICD-10-CM

## 2018-03-15 DIAGNOSIS — E1165 Type 2 diabetes mellitus with hyperglycemia: Secondary | ICD-10-CM

## 2018-03-15 DIAGNOSIS — Z6841 Body Mass Index (BMI) 40.0 and over, adult: Secondary | ICD-10-CM | POA: Insufficient documentation

## 2018-03-15 DIAGNOSIS — E785 Hyperlipidemia, unspecified: Secondary | ICD-10-CM | POA: Insufficient documentation

## 2018-03-15 MED ORDER — METFORMIN HCL ER 500 MG PO TB24: 500.0000 mg | ORAL_TABLET | Freq: Every day | ORAL | 3 refills | Status: DC

## 2018-03-15 MED ORDER — CANAGLIFLOZIN 300 MG PO TABS: 300.0000 mg | ORAL_TABLET | Freq: Every day | ORAL | 11 refills | Status: DC

## 2018-03-15 MED ORDER — BASAGLAR KWIKPEN 100 UNIT/ML ~~LOC~~ SOPN: 150.0000 [IU] | PEN_INJECTOR | SUBCUTANEOUS | 3 refills | Status: DC

## 2018-03-15 NOTE — Progress Notes (Signed)
Diabetes Self-Management Education  Visit Type: First/Initial  Appt. Start Time: 0800 Appt. End Time: 0930  03/15/2018  [name], identified by name and date of birth, is a 57 y.o. female with a diagnosis of Diabetes: Type 2. Patient here with her son, Alicia Pope, who has had type 1 diabetes since he was a child and wears an insulin pump. Patient states she sleeps from 1 AM to 11 AM or there about's. She eats twice a day with occasional snacks. She was instructed by her PCP to reduce her Basaglar gradually every 3 days until she stops having hypoglycemia in the mornings, she has reduced from 200 down to 180 units so far but is still having hypoglycemia. She has an appointment after this one with a new endocrinologist for her, Dr. Everardo AllEllison. I assured her he would address this for her.   ASSESSMENT  There were no vitals taken for this visit. There is no height or weight on file to calculate BMI.  Diabetes Self-Management Education - 03/15/18 0808      Visit Information   Visit Type  First/Initial      Initial Visit   Diabetes Type  Type 2    Are you currently following a meal plan?  No    Are you taking your medications as prescribed?  Yes    Date Diagnosed  2004      Psychosocial Assessment   Patient Belief/Attitude about Diabetes  Motivated to manage diabetes    Self-management support  Family    Other persons present  Patient;Family Member    Patient Concerns  Nutrition/Meal planning;Glycemic Control;Weight Control    Special Needs  None    Learning Readiness  Contemplating    How often do you need to have someone help you when you read instructions, pamphlets, or other written materials from your doctor or pharmacy?  1 - Never    What is the last grade level you completed in school?  11th      Pre-Education Assessment   Patient understands the diabetes disease and treatment process.  Needs Review    Patient understands incorporating nutritional management into lifestyle.   Needs Instruction    Patient undertands incorporating physical activity into lifestyle.  Needs Instruction    Patient understands using medications safely.  Needs Review    Patient understands monitoring blood glucose, interpreting and using results  Needs Review    Patient understands prevention, detection, and treatment of acute complications.  Needs Review    Patient understands prevention, detection, and treatment of chronic complications.  Needs Review    Patient understands how to develop strategies to address psychosocial issues.  Needs Review      Complications   How often do you check your blood sugar?  1-2 times/day    Fasting Blood glucose range (mg/dL)  40-981;<19;[id number];<70;130-179    Postprandial Blood glucose range (mg/dL)  56-213;[id number];130-179    Number of hypoglycemic episodes per month  4    Can you tell when your blood sugar is low?  Yes    Have you had a dilated eye exam in the past 12 months?  No    Have you had a dental exam in the past 12 months?  No    Are you checking your feet?  Yes    How many days per week are you checking your feet?  7      Dietary Intake   Breakfast  skips    Lunch  sandwich with chips  Snack (afternoon)  yogurt and fresh strawberries,     Dinner  chicken either fried or baked, starch and vegetable, occasionally bread    Snack (evening)  fresh fruit or chips usually, sometimes none    Beverage(s)  Diet gingerale, Propel water      Exercise   Exercise Type  ADL's   bad hip   How many days per week to you exercise?  0      Patient Education   Previous Diabetes Education  No    Disease state   Factors that contribute to the development of diabetes    Nutrition management   Role of diet in the treatment of diabetes and the relationship between the three main macronutrients and blood glucose level;Carbohydrate counting;Food label reading, portion sizes and measuring food.;Reviewed blood glucose goals for pre and post meals and how to evaluate the  patients' food intake on their blood glucose level.    Physical activity and exercise   Role of exercise on diabetes management, blood pressure control and cardiac health.    Medications  Reviewed patients medication for diabetes, action, purpose, timing of dose and side effects.    Monitoring  Identified appropriate SMBG and/or A1C goals.    Psychosocial adjustment  Role of stress on diabetes      Individualized Goals (developed by patient)   Nutrition  Follow meal plan discussed    Physical Activity  Exercise 3-5 times per week    Medications  take my medication as prescribed    Monitoring   test blood glucose pre and post meals as discussed      Post-Education Assessment   Patient understands the diabetes disease and treatment process.  Demonstrates understanding / competency    Patient understands incorporating nutritional management into lifestyle.  Demonstrates understanding / competency    Patient undertands incorporating physical activity into lifestyle.  Demonstrates understanding / competency    Patient understands using medications safely.  Demonstrates understanding / competency    Patient understands monitoring blood glucose, interpreting and using results  Demonstrates understanding / competency    Patient understands prevention, detection, and treatment of acute complications.  Demonstrates understanding / competency    Patient understands prevention, detection, and treatment of chronic complications.  Demonstrates understanding / competency    Patient understands how to develop strategies to address psychosocial issues.  Demonstrates understanding / competency    Patient understands how to develop strategies to promote health/change behavior.  Demonstrates understanding / competency      Outcomes   Expected Outcomes  Demonstrated interest in learning. Expect positive outcomes    Future DMSE  PRN    Program Status  Completed       Individualized Plan for Diabetes  Self-Management Training:   Learning Objective:  Patient will have a greater understanding of diabetes self-management. Patient education plan is to attend individual and/or group sessions per assessed needs and concerns.   Plan:   Patient Instructions  Plan:  Aim for 3 Carb Choices per meal (45 grams) +/- 1 either way  Include 3 meals a day Aim for 0-1 Carbs per snack if hungry  Include protein in moderation with your meals and snacks Consider reading food labels for Total Carbohydrate of foods Consider  increasing your activity level with Arm Chair Exercises for 5 minutes twice daily and increase as tolerated Consider checking BG at alternate times per day  Continue taking medication as directed by MD  Expected Outcomes:  Demonstrated interest in learning. Expect  positive outcomes  Education material provided: Food label handouts, A1C conversion sheet, Meal plan card and Carbohydrate counting sheet Arm Chair Exericises  If problems or questions, patient to contact team via:  Phone  Future DSME appointment: PRN

## 2018-03-15 NOTE — Progress Notes (Signed)
Subjective:    [name], female    DOB: Feb 14, 1961, 57 y.o.   MRN: [id number]  HPI pt is referred by Dena Billet, PA, for diabetes.  Pt states DM was dx'ed in 2004 (she had GDM in 1988); she has mild if any neuropathy of the lower extremities; she is unaware of any associated chronic complications; she has been on insulin since 2018; pt says her diet is fair, and exercise is poor; she has never had pancreatitis, pancreatic surgery, severe hypoglycemia or DKA.  She takes basaglar, 180 units qd, metformin, and victoza.  Meter is downloaded today, and the printout is scanned into the record.  cbg varies from 55-149.  There is no trend throughout the day.   Past Medical History:  Diagnosis Date  . Allergy   . Anxiety 2013  . Asthma 2015  . Depression 2013  . Diabetes mellitus 2004  . GERD (gastroesophageal reflux disease) 2013  . Hyperlipidemia 1992  . Hypertension 1992  . Memory impairment     Past Surgical History:  Procedure Laterality Date  . ANTERIOR CRUCIATE LIGAMENT REPAIR  1997  . APPENDECTOMY  1987  . CESAREAN SECTION     [id number]  . CHOLECYSTECTOMY  1987  . uterine ablation      Social History   Socioeconomic History  . Marital status: Married    Spouse name: Not on file  . Number of children: Not on file  . Years of education: Not on file  . Highest education level: Not on file  Occupational History  . Not on file  Social Needs  . Financial resource strain: Not on file  . Food insecurity:    Worry: Not on file    Inability: Not on file  . Transportation needs:    Medical: Not on file    Non-medical: Not on file  Tobacco Use  . Smoking status: Never Smoker  . Smokeless tobacco: Never Used  Substance and Sexual Activity  . Alcohol use: No  . Drug use: No  . Sexual activity: Yes  Lifestyle  . Physical activity:    Days per week: Not on file    Minutes per session: Not on file  . Stress: Not on file  Relationships  . Social  connections:    Talks on phone: Not on file    Gets together: Not on file    Attends religious service: Not on file    Active member of club or organization: Not on file    Attends meetings of clubs or organizations: Not on file    Relationship status: Not on file  . Intimate partner violence:    Fear of current or ex partner: Not on file    Emotionally abused: Not on file    Physically abused: Not on file    Forced sexual activity: Not on file  Other Topics Concern  . Not on file  Social History Narrative   Married.    Lives with husband and youngest son, who is 35 y/o.   Has 4 children.   Does not work outside of home.    No formal exercise. Only activity around the house.   Tries to be careful with Diabetic diet.    Current Outpatient Medications on File Prior to Visit  Medication Sig Dispense Refill  . aspirin 81 MG tablet Take 1 tablet (81 mg total) by mouth daily. 30 tablet 11  . blood glucose meter kit and supplies KIT Inject 1  each into the skin as directed. Dispense based on patient and insurance preference. Check blood sugar fasting each morning.  ICD E11.65 1 each 0  . Blood Glucose Monitoring Suppl (FREESTYLE LITE) DEVI UAD.E11.65  0  . [facility] D3 2000 units CAPS Take 2 capsules (4,000 Units total) by mouth daily. 30 each 3  . glucose blood test strip Dispense based on patient and insurance preference.  Check blood sugar fasting each morning.  ICD E11.65 50 each 12  . Lancets MISC Dispense based on patient and insurance preference.  Check blood sugar fasting each morning.  ICD E11.65 100 each 3  . liraglutide (VICTOZA) 18 MG/3ML SOPN Start 0.'6mg'$  subcutaneous daily for 1 week then increase to 1.'2mg'$  subcutaneous daily for 1 week, then increase to 1.'8mg'$  subcutaneous daily 5 pen 5  . lisinopril-hydrochlorothiazide (PRINZIDE,ZESTORETIC) 20-12.5 MG tablet TAKE 1 TABLET BY MOUTH DAILY. 90 tablet 3  . LORazepam (ATIVAN) 1 MG tablet TAKE 1 TABLET BY MOUTH TWICE A DAY 60 tablet 2  .  mirtazapine (REMERON) 30 MG tablet TAKE 1 TABLET AT BEDTIME 90 tablet 0  . rosuvastatin (CRESTOR) 40 MG tablet TAKE 1 TABLET (40 MG TOTAL) BY MOUTH DAILY. 30 tablet 4  . Insulin Glargine (BASAGLAR KWIKPEN) 100 UNIT/ML SOPN INJECT UP TO 200 UNITS SUBCUTANEOUS DAILY 15 mL 1   No current facility-administered medications on file prior to visit.     Allergies  Allergen Reactions  . Contrast Media [Iodinated Diagnostic Agents] Anaphylaxis  . Ambien [Zolpidem Tartrate] Other (See Comments)    Family has told her that after taking Ambien, she does "crazy things" that she does not remember doing.   . Codeine Nausea And Vomiting    Family History  Problem Relation Age of Onset  . Cancer Father        Lymphoma, Leukemia  . Depression Father   . Cancer Maternal Grandfather   . Cancer Paternal Grandmother   . Diabetes Paternal Grandfather   . Kidney disease Paternal Grandfather     BP (!) 132/96 (BP Location: Left Arm, Patient Position: Sitting, Cuff Size: Normal)   Pulse 69   Ht '5\' 3"'$  (1.6 m)   Wt 249 lb 3.2 oz (113 kg)   SpO2 96%   BMI 44.14 kg/m     Review of Systems denies blurry vision, headache, chest pain, sob, urinary frequency, muscle cramps, excessive diaphoresis, memory loss, depression, cold intolerance, rhinorrhea, and easy bruising.  She has weight gain and nausea.     Objective:   Physical Exam VS: see vs page GEN: no distress HEAD: head: no deformity eyes: no periorbital swelling, no proptosis external nose and ears are normal mouth: no lesion seen NECK: supple, thyroid is not enlarged CHEST WALL: no deformity LUNGS: clear to auscultation CV: reg rate and rhythm, no murmur ABD: abdomen is soft, nontender.  no hepatosplenomegaly.  not distended.  no hernia MUSCULOSKELETAL: muscle bulk and strength are grossly normal.  no obvious joint swelling.  gait is normal and steady EXTEMITIES: no deformity.  no ulcer on the feet.  feet are of normal color and temp.   Trace bilat leg edema PULSES: dorsalis pedis intact bilat.  no carotid bruit NEURO:  cn 2-12 grossly intact.   readily moves all 4's.  sensation is intact to touch on the feet SKIN:  Normal texture and temperature.  No rash or suspicious lesion is visible.   NODES:  None palpable at the neck PSYCH: alert, well-oriented.  Does not appear anxious nor depressed.  Lab Results  Component Value Date   HGBA1C 7.7 (H) 02/28/2018   Lab Results  Component Value Date   CREATININE 0.51 02/28/2018   BUN 8 02/28/2018   NA 141 02/28/2018   K 3.7 02/28/2018   CL 106 02/28/2018   CO2 26 02/28/2018   I have reviewed outside records, and summarized: Pt was noted to have elevated a1c, and referred here.  She was also noted to have HTN and dyslipidemia.       Assessment & Plan:  Insulin-requiring type 2 DM: he needs increased rx Obesity: new to me  Patient Instructions  good diet and exercise significantly improve the control of your diabetes.  please let me know if you wish to be referred to a dietician.  high blood sugar is very risky to your health.  you should see an eye doctor and dentist every year.  It is very important to get all recommended vaccinations.  Controlling your blood pressure and cholesterol drastically reduces the damage diabetes does to your body.  Those who smoke should quit.  Please discuss these with your doctor.  check your blood sugar twice a day.  vary the time of day when you check, between before the 3 meals, and at bedtime.  also check if you have symptoms of your blood sugar being too high or too low.  please keep a record of the readings and bring it to your next appointment here (or you can bring the meter itself).  You can write it on any piece of paper.  please call us sooner if your blood sugar goes below 70, or if you have a lot of readings over 200. On this type of insulin schedule, you should eat meals on a regular schedule.  If a meal is missed or significantly  delayed, your blood sugar could go low. We will need to take this complex situation in stages. I have sent a prescription to your pharmacy, to add "invokana."  Please also reduce the insulin to 150 units daily.  Please come back for a follow-up appointment in 2 months.     Bariatric Surgery You have so much to gain by losing weight.  You may have already tried every diet and exercise plan imaginable.  And, you may have sought advice from your family physician, too.   Sometimes, in spite of such diligent efforts, you may not be able to achieve long-term results by yourself.  In cases of severe obesity, bariatric or weight loss surgery is a proven method of achieving long-term weight control.  Our Services Our bariatric surgery programs offer our patients new hope and long-term weight-loss solution.  Since introducing our services in 2003, we have conducted more than 2,400 successful procedures.  Our program is designated as a Programmer, multimedia by the Metabolic and Bariatric Surgery Accreditation and Quality Improvement Program (MBSAQIP), a IT trainer that sets rigorous patient safety and outcome standards.  Our program is also designated as a Ecologist by [employer].   Our exceptional weight-loss surgery team specializes in diagnosis, treatment, follow-up care, and ongoing support for our patients with severe weight loss challenges.  We currently offer laparoscopic sleeve gastrectomy, gastric bypass, and adjustable gastric band (LAP-BAND).    Attend our [place] Choosing to undergo a bariatric procedure is a big decision, and one that should not be taken lightly.  You now have two options in how you learn about weight-loss surgery - in person or online.  Our  objective is to ensure you have all of the information that you need to evaluate the advantages and obligations of this life changing procedure.  Please note that you are not alone in this  process, and our experienced team is ready to assist and answer all of your questions.  There are several ways to register for a seminar (either on-line or in person): 1)  Call [phone] 2) Go on-line to [facility] and register for either type of seminar.  [email or web address]

## 2018-03-15 NOTE — Patient Instructions (Signed)
Plan:  Aim for 3 Carb Choices per meal (45 grams) +/- 1 either way  Include 3 meals a day Aim for 0-1 Carbs per snack if hungry  Include protein in moderation with your meals and snacks Consider reading food labels for Total Carbohydrate of foods Consider  increasing your activity level with Arm Chair Exercises for 5 minutes twice daily and increase as tolerated Consider checking BG at alternate times per day  Continue taking medication as directed by MD

## 2018-03-15 NOTE — Patient Instructions (Addendum)
good diet and exercise significantly improve the control of your diabetes.  please let me know if you wish to be referred to a dietician.  high blood sugar is very risky to your health.  you should see an eye doctor and dentist every year.  It is very important to get all recommended vaccinations.  Controlling your blood pressure and cholesterol drastically reduces the damage diabetes does to your body.  Those who smoke should quit.  Please discuss these with your doctor.  check your blood sugar twice a day.  vary the time of day when you check, between before the 3 meals, and at bedtime.  also check if you have symptoms of your blood sugar being too high or too low.  please keep a record of the readings and bring it to your next appointment here (or you can bring the meter itself).  You can write it on any piece of paper.  please call us sooner if your blood sugar goes below 70, or if you have a lot of readings over 200. On this type of insulin schedule, you should eat meals on a regular schedule.  If a meal is missed or significantly delayed, your blood sugar could go low. We will need to take this complex situation in stages. I have sent a prescription to your pharmacy, to add "invokana."  Please also reduce the insulin to 150 units daily.  Please come back for a follow-up appointment in 2 months.     Bariatric Surgery You have so much to gain by losing weight.  You may have already tried every diet and exercise plan imaginable.  And, you may have sought advice from your family physician, too.   Sometimes, in spite of such diligent efforts, you may not be able to achieve long-term results by yourself.  In cases of severe obesity, bariatric or weight loss surgery is a proven method of achieving long-term weight control.  Our Services Our bariatric surgery programs offer our patients new hope and long-term weight-loss solution.  Since introducing our services in 2003, we have conducted more than  2,400 successful procedures.  Our program is designated as a Investment banker, corporateComprehensive Center by the Metabolic and Bariatric Surgery Accreditation and Quality Improvement Program (MBSAQIP), a Child psychotherapistnational accrediting body that sets rigorous patient safety and outcome standards.  Our program is also designated as a Engineer, manufacturing systemsCenter of Excellence by [employer] insurance companies.   Our exceptional weight-loss surgery team specializes in diagnosis, treatment, follow-up care, and ongoing support for our patients with severe weight loss challenges.  We currently offer laparoscopic sleeve gastrectomy, gastric bypass, and adjustable gastric band (LAP-BAND).    Attend our Bariatrics Seminar Choosing to undergo a bariatric procedure is a big decision, and one that should not be taken lightly.  You now have two options in how you learn about weight-loss surgery - in person or online.  Our objective is to ensure you have all of the information that you need to evaluate the advantages and obligations of this life changing procedure.  Please note that you are not alone in this process, and our experienced team is ready to assist and answer all of your questions.  There are several ways to register for a seminar (either on-line or in person): 1)  Call [phone] 2) Go on-line to [facility] and register for either type of seminar.  [email or web address]

## 2018-03-20 ENCOUNTER — Other Ambulatory Visit: Payer: Self-pay | Admitting: Family Medicine

## 2018-03-31 ENCOUNTER — Ambulatory Visit: Payer: 59 | Admitting: Family Medicine

## 2018-04-10 ENCOUNTER — Other Ambulatory Visit: Payer: Self-pay | Admitting: Family Medicine

## 2018-04-17 ENCOUNTER — Other Ambulatory Visit: Payer: Self-pay | Admitting: *Deleted

## 2018-04-17 NOTE — Telephone Encounter (Signed)
Received fax requesting refill on Ativan.   Ok to refill??  Last office visit 02/28/2018.  Last refill 01/05/2018, #2 refills.

## 2018-04-18 MED ORDER — LORAZEPAM 1 MG PO TABS: 1.0000 mg | ORAL_TABLET | Freq: Two times a day (BID) | ORAL | 0 refills | Status: DC

## 2018-04-18 NOTE — Telephone Encounter (Signed)
Put one Rx though w/o refills.  Need to review chart and hx, since pts PCP left and she has switched to my panel.

## 2018-04-19 ENCOUNTER — Encounter: Payer: Self-pay | Admitting: Family Medicine

## 2018-04-19 ENCOUNTER — Ambulatory Visit: Payer: 59 | Admitting: Family Medicine

## 2018-04-19 VITALS — BP 130/86 | HR 62 | Temp 97.6°F | Resp 15 | Ht 63.0 in | Wt 244.2 lb

## 2018-04-19 DIAGNOSIS — R1901 Right upper quadrant abdominal swelling, mass and lump: Secondary | ICD-10-CM

## 2018-04-19 DIAGNOSIS — E1165 Type 2 diabetes mellitus with hyperglycemia: Secondary | ICD-10-CM

## 2018-04-19 DIAGNOSIS — M545 Low back pain, unspecified: Secondary | ICD-10-CM

## 2018-04-19 DIAGNOSIS — M25551 Pain in right hip: Secondary | ICD-10-CM

## 2018-04-19 DIAGNOSIS — IMO0001 Reserved for inherently not codable concepts without codable children: Secondary | ICD-10-CM

## 2018-04-19 DIAGNOSIS — R112 Nausea with vomiting, unspecified: Secondary | ICD-10-CM

## 2018-04-19 DIAGNOSIS — R935 Abnormal findings on diagnostic imaging of other abdominal regions, including retroperitoneum: Secondary | ICD-10-CM

## 2018-04-19 DIAGNOSIS — K219 Gastro-esophageal reflux disease without esophagitis: Secondary | ICD-10-CM

## 2018-04-19 DIAGNOSIS — G8929 Other chronic pain: Secondary | ICD-10-CM

## 2018-04-19 DIAGNOSIS — Z794 Long term (current) use of insulin: Secondary | ICD-10-CM

## 2018-04-19 MED ORDER — METOCLOPRAMIDE HCL 5 MG PO TABS: 5.0000 mg | ORAL_TABLET | Freq: Four times a day (QID) | ORAL | 0 refills | Status: DC

## 2018-04-19 MED ORDER — PANTOPRAZOLE SODIUM 40 MG PO TBEC: 40.0000 mg | DELAYED_RELEASE_TABLET | Freq: Every day | ORAL | 3 refills | Status: DC

## 2018-04-19 NOTE — Patient Instructions (Signed)
Stop omeprazole and start taking protonix once at night on an empty stomach (no eating 2 hours before taking)  Use reglan as needed to treat nausea - if you have any agitation with it, take a benadryl  Check your metformin medicine at home and see if it is XR or 24 hour and let me know.     I want to see if 24 hour metformin helps stomach and nausea symptoms, if you are already on 24 hour or XR form, want you to hold it for the next week and see how your nausea is  Call Dr. Everardo AllEllison and let him know all your sx and meds you've changed    IF you have low blood sugars, decreased your insulin by 5 units and see how your sugars do for the next week (before adjusting)  Of course if endocrinology wants to see you - you need to go see them and I would defer all of this to them    [facility] as soon as you can.

## 2018-04-19 NOTE — Progress Notes (Signed)
Patient ID: NYSA SARIN, female    DOB: 06-18-1960, 58 y.o.   MRN: [id number]  PCP: Delsa Grana, PA-C  Chief Complaint  Patient presents with  . Diabetes    Patient in today in for 1 month follow up on DM. Currently sees Dr. Loanne Drilling at Endocrinology. Has self discontinued Invokana due to recurrent yeast infections while on the medication    Subjective:   Alicia Pope is a 58 y.o. female, presents to clinic with CC of follow up for DM, obesity, and N/V.  She was referred to endocrinology and also nutrition and diabetes education/counseling.  She saw Dr. Loanne Drilling endocrinologist on March 15, 2018, I had referred her due to very high basal insulin use for patient is new to me, he did adjust her basal glargine to take 150 units daily she switched taking it from nighttime to in the morning, he did have extended release metformin and added Invokana.  She was instructed to work on diet exercise, go to nutritional referral, he wanted her to start getting into weight loss surgery options, she was to record her blood sugars 2 times a day and return for follow-up appointment in 2 months.  He she was also instructed to notify them if she has blood sugar readings under 70 or over 200.  She stopped taking Invokana because it was giving her yeast infections and urinary symptoms which she was able to successfully treat with Monistat over-the-counter.  Has not contacted the endocrinologist to report any of this.  Her blood sugars have been low the highest she has had is been about 80 and she is had multiple times when it was 60 years she felt very ill.  Not contacted the endocrinologist about this, and has not adjusted any of her other medications.  She believes she is taking 500 mg metformin twice a day and she is doing the Victoza injections, currently dose of 1.8 mg.  Today she complains of persistent nausea for several weeks and months.  She believes it is from the metformin.  She is not sure  if she has changed from regular metformin to extended release although I have prescribed it and Dr. Loanne Drilling has prescribed it.  She reports persistent nauseated every day, and frequent vomiting episodes, and the past week she has thrown up 4 times, oftentimes is food sometimes emesis is more better or yellow.    She reports that she ate an orange one day and then two days later threw up pieces of orange.  She has decreased appetite, denies abdominal pain although she does have a little indigestion and acid reflux.  She is taking over-the-counter omeprazole in the morning with all of her morning medications.  Denies any bowel changes such as constipation or diarrhea she reports that when her blood sugars are very low sometimes her stools will become loose but otherwise her bowels have been normal. She denies dysphasia. She is currently not having any urinary symptoms including she denies dysuria, hematuria, urinary frequency urgency or nocturia.  She denies any vaginal discharge itching She denies any fever chills sweats rash sick contacts headaches, weakness, near-syncope  This am fasting 60-80 after decreasing to 150 unit insulin in am  She has lost weight:  Wt Readings from Last 5 Encounters:  04/19/18 244 lb 4 oz (110.8 kg)  03/15/18 249 lb 3.2 oz (113 kg)  02/28/18 257 lb 4 oz (116.7 kg)  11/23/17 252 lb 9.6 oz (114.6 kg)  08/22/17 248 lb  3.2 oz (112.6 kg)   Has history of 4 cesarean sections, cholecystectomy and appendectomy.    She reports that she did go to the nutritionist and they mostly adjusted and discussed monitoring her carb intake they did not talk about calories or any other dietary changes.  She is not monitoring her calories per day.    She is not exercising because of several decades of right hip pain and right low back pain she states that when she was 58 years old she had arthritis about in her hips and she continues to have pain, currently it is low right back radiating  around her hip gets worse with weightbearing and with periods of immobility.  He is not seen a specialist or done any images in the past 5 to 10 years or so.  Other the endocrinologist told her to go to bariatric weight loss surgery information session she has not gone yet.    Patient Active Problem List   Diagnosis Date Noted  . Diabetes mellitus type 2, uncontrolled, without complications ([place]) 59/74/1638  . Insomnia 04/02/2015  . Obesity 09/23/2014  . Vitamin D deficiency 09/23/2014  . Screening for colorectal cancer 09/23/2014  . Asthma   . Anxiety   . Depression   . Diabetes ([place])   . GERD (gastroesophageal reflux disease)   . Hypertension   . Hyperlipidemia      Prior to Admission medications   Medication Sig Start Date End Date Taking? Authorizing Provider  aspirin 81 MG tablet Take 1 tablet (81 mg total) by mouth daily. 11/05/13  Yes Dena Billet B, PA-C  blood glucose meter kit and supplies KIT Inject 1 each into the skin as directed. Dispense based on patient and insurance preference. Check blood sugar fasting each morning.  ICD E11.65 09/24/14  Yes Dena Billet B, PA-C  Blood Glucose Monitoring Suppl (FREESTYLE LITE) DEVI UAD.E11.65 09/24/14  Yes [provider]  [facility] D3 2000 units CAPS Take 2 capsules (4,000 Units total) by mouth daily. 07/29/16  Yes Dena Billet B, PA-C  glucose blood test strip Dispense based on patient and insurance preference.  Check blood sugar fasting each morning.  ICD E11.65 09/24/14  Yes Orlena Sheldon, PA-C  Insulin Glargine (BASAGLAR KWIKPEN) 100 UNIT/ML SOPN Inject 1.5 mLs (150 Units total) into the skin every morning. 03/15/18  Yes Renato Shin, MD  Insulin Glargine [facility] KWIKPEN) 100 UNIT/ML SOPN INJECT UP TO 200 UNITS SUBCUTANEOUS DAILY 03/20/18  Yes Delsa Grana, PA-C  Lancets MISC Dispense based on patient and insurance preference.  Check blood sugar fasting each morning.  ICD E11.65 09/24/14  Yes Dena Billet B, PA-C  liraglutide  (VICTOZA) 18 MG/3ML SOPN Start 0.86m subcutaneous daily for 1 week then increase to 1.273msubcutaneous daily for 1 week, then increase to 1.60m63mubcutaneous daily 01/26/18  Yes PicSusy FrizzleD  lisinopril-hydrochlorothiazide (PRINZIDE,ZESTORETIC) 20-12.5 MG tablet TAKE 1 TABLET BY MOUTH DAILY. 12/29/17  Yes DixDena Billet PA-C  LORazepam (ATIVAN) 1 MG tablet Take 1 tablet (1 mg total) by mouth 2 (two) times daily. 04/18/18  Yes TapDelsa GranaA-C  metFORMIN (GLUCOPHAGE-XR) 500 MG 24 hr tablet Take 1 tablet (500 mg total) by mouth daily with breakfast. 03/15/18  Yes EllRenato ShinD  mirtazapine (REMERON) 30 MG tablet TAKE 1 TABLET AT BEDTIME 02/01/18  Yes PicSusy FrizzleD  rosuvastatin (CRESTOR) 40 MG tablet TAKE 1 TABLET (40 MG TOTAL) BY MOUTH DAILY. 12/01/17  Yes DixOrlena SheldonA-C  canagliflozin ([facility]  300 MG TABS tablet Take 1 tablet (300 mg total) by mouth daily before breakfast. Patient not taking: Reported on 04/19/2018 03/15/18   Renato Shin, MD     Allergies  Allergen Reactions  . Contrast Media [Iodinated Diagnostic Agents] Anaphylaxis  . Ambien [Zolpidem Tartrate] Other (See Comments)    Family has told her that after taking Ambien, she does "crazy things" that she does not remember doing.   . Codeine Nausea And Vomiting     Family History  Problem Relation Age of Onset  . Cancer Father        Lymphoma, Leukemia  . Depression Father   . Cancer Maternal Grandfather   . Cancer Paternal Grandmother   . Diabetes Paternal Grandfather   . Kidney disease Paternal Grandfather      Social History   Socioeconomic History  . Marital status: Married    Spouse name: Not on file  . Number of children: Not on file  . Years of education: Not on file  . Highest education level: Not on file  Occupational History  . Not on file  Social Needs  . Financial resource strain: Not on file  . Food insecurity:    Worry: Not on file    Inability: Not on file  .  Transportation needs:    Medical: Not on file    Non-medical: Not on file  Tobacco Use  . Smoking status: Never Smoker  . Smokeless tobacco: Never Used  Substance and Sexual Activity  . Alcohol use: No  . Drug use: No  . Sexual activity: Yes  Lifestyle  . Physical activity:    Days per week: Not on file    Minutes per session: Not on file  . Stress: Not on file  Relationships  . Social connections:    Talks on phone: Not on file    Gets together: Not on file    Attends religious service: Not on file    Active member of club or organization: Not on file    Attends meetings of clubs or organizations: Not on file    Relationship status: Not on file  . Intimate partner violence:    Fear of current or ex partner: Not on file    Emotionally abused: Not on file    Physically abused: Not on file    Forced sexual activity: Not on file  Other Topics Concern  . Not on file  Social History Narrative   Married.    Lives with husband and youngest son, who is 29 y/o.   Has 4 children.   Does not work outside of home.    No formal exercise. Only activity around the house.   Tries to be careful with Diabetic diet.     Review of Systems  Constitutional: Negative.   HENT: Negative.   Eyes: Negative.   Respiratory: Negative.   Cardiovascular: Negative.   Gastrointestinal: Negative.   Endocrine: Negative.   Genitourinary: Negative.   Musculoskeletal: Negative.   Skin: Negative.   Allergic/Immunologic: Negative.   Neurological: Negative.   Hematological: Negative.   Psychiatric/Behavioral: Negative.   All other systems reviewed and are negative.      Objective:    Vitals:   04/19/18 0948  BP: 130/86  Pulse: 62  Resp: 15  Temp: 97.6 F (36.4 C)  TempSrc: Oral  SpO2: 96%  Weight: 244 lb 4 oz (110.8 kg)  Height: _0  (1.6 m)      Physical Exam Vitals signs and  nursing note reviewed.  Constitutional:      General: She is not in acute distress.    Appearance:  Normal appearance. She is well-developed. She is obese. She is not ill-appearing, toxic-appearing or diaphoretic.  HENT:     Head: Normocephalic and atraumatic.     Right Ear: External ear normal.     Left Ear: External ear normal.     Nose: Nose normal.     Mouth/Throat:     Mouth: Mucous membranes are dry.     Pharynx: Uvula midline.  Eyes:     General: Lids are normal.     Conjunctiva/sclera: Conjunctivae normal.     Pupils: Pupils are equal, round, and reactive to light.  Neck:     Musculoskeletal: Normal range of motion and neck supple.     Trachea: Phonation normal. No tracheal deviation.  Cardiovascular:     Rate and Rhythm: Normal rate and regular rhythm.     Pulses: Normal pulses.          Radial pulses are 2+ on the right side and 2+ on the left side.       Posterior tibial pulses are 2+ on the right side and 2+ on the left side.     Heart sounds: Normal heart sounds. No murmur. No friction rub. No gallop.   Pulmonary:     Effort: Pulmonary effort is normal. No respiratory distress.     Breath sounds: Normal breath sounds. No stridor. No wheezing, rhonchi or rales.  Chest:     Chest wall: No tenderness.  Abdominal:     General: Abdomen is protuberant. Bowel sounds are normal. There is no distension.     Palpations: Abdomen is soft. There is no mass or pulsatile mass.     Tenderness: There is abdominal tenderness (mild ttp, no guarding, no rebound) in the epigastric area. There is no right CVA tenderness, left CVA tenderness, guarding or rebound. Negative signs include Murphy's sign.  Musculoskeletal:        General: No deformity.     Right hip: She exhibits decreased range of motion. She exhibits normal strength, no tenderness, no bony tenderness and no crepitus.     Lumbar back: She exhibits tenderness and bony tenderness. She exhibits normal range of motion and normal pulse.       Back:  Lymphadenopathy:     Cervical: No cervical adenopathy.  Skin:    General: Skin  is warm and dry.     Capillary Refill: Capillary refill takes less than 2 seconds.     Coloration: Skin is not pale.     Findings: No rash.  Neurological:     Mental Status: She is alert and oriented to person, place, and time.     Sensory: Sensation is intact.     Motor: No weakness or abnormal muscle tone.     Gait: Gait is intact. Gait normal.  Psychiatric:        Speech: Speech normal.        Behavior: Behavior normal.           Assessment & Plan:      ICD-10-CM   1. Non-intractable vomiting with nausea, unspecified vomiting type R11.2 pantoprazole (PROTONIX) 40 MG tablet    DG Abd 1 View    DISCONTINUED: metoCLOPramide (REGLAN) 5 MG tablet   wide Ddx - see below  2. Chronic right-sided low back pain without sciatica M54.5 DG HIP UNILAT WITH PELVIS 2-3 VIEWS RIGHT  G89.29 DG Lumbar Spine Complete   chronic - eval with plain films  3. Chronic right hip pain M25.551 DG HIP UNILAT WITH PELVIS 2-3 VIEWS RIGHT   G89.29 DG Lumbar Spine Complete   she reports hx of OA, plain films to start - would like to start addressing barriers to weight loss/exercise.  4. Uncontrolled type 2 diabetes mellitus without complication, with long-term current use of insulin ([place]) E11.65    Z79.4    follow up with endocrinologist with all med changes, SE, hypoglycemia, insulin dose adjustments and defer to his recommendations.  5. Gastroesophageal reflux disease, esophagitis presence not specified K21.9 pantoprazole (PROTONIX) 40 MG tablet    Patient with insulin-dependent diabetes, has been referred to specialist but she presents here for follow-up after not tolerating medicines and having nausea and vomiting decreased appetite and weight loss.  She is currently doing 150 units of Basaglar daily, doing Victoza injection daily and metformin although she is not quite sure what kind or dose of metformin she is taking.  She has discontinued Invokana herself because of yeast infections.    Patient  endorses having low blood sugar readings ranging from 60-80, though I have encouraged her to direct her diabetes management questions to the specialist we did discuss decreasing insulin dose by 5 units for the next week to prevent any hypoglycemia.  She is going to go home and check which kind of metformin she has, if she does not have extended release I would like her to try the extended release formula to see if that improves her abdominal symptoms.  Her abd symptoms are concerning with persistant N, frequent vomiting, and even vomiting up stomach contents from 2 days prior -  I am not sure if it is from medication? Med SE? (very delayed gastric emptying from Victoza?) GERD vs gastroparesis, or other GI pathology.    Plan for N/V  - adjust metformin from regular to extended release or if she is already on extended release 2 hold metformin for the next week to see if it improves her symptoms  - stop omeprazole and start protonix 40 mg PO q bedtime, no eating several hours before  - had plan to Rx reglan -but I did review her chart and the only EKGs I can find from 2013 2014 showed prolonged QTC, I have reviewed her medications, I have called the pharmacist and there are no current medications which interact to prolong QTC however at this time going to cancel the Reglan prescription have the patient work on these other med adjustments and follow-up and see if her symptoms have improved at all.  If they have not I will obtain an EKG to recheck QTc.  She may need to hold or decrease dose on Victoza she still not improving try Reglan or refer to GI    Delsa Grana, PA-C 04/19/18 10:10 AM

## 2018-04-20 ENCOUNTER — Telehealth: Payer: Self-pay | Admitting: Family Medicine

## 2018-04-20 NOTE — Telephone Encounter (Signed)
Spoke with patient and informed her Danelle Berry, PA-C recommendations. Patient verbalized understanding .

## 2018-04-20 NOTE — Telephone Encounter (Signed)
I would have her take metformin once a day and see if her abdominal sx ease up.  The victoza injections can cause the stomach to have huge delay emptying and we may need to try adjust that.  I would really like her to discuss the symptoms with her new endocrinologist because all these meds are now under their management and it would be better for them to adjust and maintain control of it all.    If the N/V is still a problem after endocrine weighs in, then we will need to see her and work it up as GI issues.  (basically if she can get in with endocrine ASAP for the follow up, she should do that and not f/up with me)

## 2018-04-20 NOTE — Telephone Encounter (Signed)
Patient called in to let us know what the strength and dose of metformin that she is taking. States that she is taking Metformin ER 500 MG.

## 2018-04-25 ENCOUNTER — Other Ambulatory Visit: Payer: Self-pay | Admitting: *Deleted

## 2018-04-25 MED ORDER — ROSUVASTATIN CALCIUM 40 MG PO TABS: ORAL_TABLET | ORAL | 4 refills | Status: DC

## 2018-05-01 ENCOUNTER — Ambulatory Visit
Admission: RE | Admit: 2018-05-01 | Discharge: 2018-05-01 | Disposition: A | Discharge status: Home / Self Care | Payer: 59 | Source: Ambulatory Visit | Attending: Family Medicine | Admitting: Family Medicine

## 2018-05-01 DIAGNOSIS — M545 Low back pain: Secondary | ICD-10-CM | POA: Diagnosis not present

## 2018-05-01 DIAGNOSIS — R112 Nausea with vomiting, unspecified: Secondary | ICD-10-CM | POA: Diagnosis not present

## 2018-05-01 DIAGNOSIS — M25551 Pain in right hip: Secondary | ICD-10-CM | POA: Diagnosis not present

## 2018-05-02 NOTE — Addendum Note (Signed)
Addended by: Danelle Berry on: 05/02/2018 05:05 PM   Modules accepted: Orders

## 2018-05-03 ENCOUNTER — Encounter: Payer: Self-pay | Admitting: Family Medicine

## 2018-05-03 ENCOUNTER — Ambulatory Visit: Payer: 59 | Admitting: Family Medicine

## 2018-05-03 VITALS — BP 130/84 | HR 67 | Temp 97.6°F | Resp 16 | Ht 63.0 in | Wt 245.1 lb

## 2018-05-03 DIAGNOSIS — M545 Low back pain, unspecified: Secondary | ICD-10-CM

## 2018-05-03 DIAGNOSIS — R112 Nausea with vomiting, unspecified: Secondary | ICD-10-CM

## 2018-05-03 DIAGNOSIS — Z794 Long term (current) use of insulin: Secondary | ICD-10-CM

## 2018-05-03 DIAGNOSIS — E1165 Type 2 diabetes mellitus with hyperglycemia: Secondary | ICD-10-CM | POA: Diagnosis not present

## 2018-05-03 DIAGNOSIS — IMO0001 Reserved for inherently not codable concepts without codable children: Secondary | ICD-10-CM

## 2018-05-03 MED ORDER — NAPROXEN SODIUM 220 MG PO TABS: 220.0000 mg | ORAL_TABLET | Freq: Two times a day (BID) | ORAL | 0 refills | Status: DC | PRN

## 2018-05-03 MED ORDER — PREDNISONE 20 MG PO TABS: 40.0000 mg | ORAL_TABLET | Freq: Every day | ORAL | 0 refills | Status: AC

## 2018-05-03 MED ORDER — TIZANIDINE HCL 2 MG PO CAPS: 2.0000 mg | ORAL_CAPSULE | Freq: Three times a day (TID) | ORAL | 1 refills | Status: DC | PRN

## 2018-05-03 NOTE — Progress Notes (Signed)
Great - thanks

## 2018-05-03 NOTE — Progress Notes (Signed)
Patient ID: [name], female    DOB: [birth year], 58 y.o.   MRN: [id number]  PCP: Delsa Grana, PA-C  Chief Complaint  Patient presents with  . Nausea    Patient in for a 2 week follow up. States still having some nausea since discontinuing metformin    Subjective:   Alicia Pope is a 58 y.o. female, presents to clinic with CC of still severe back pain and follow up on N/V, a little improved, but continued N/V- nausea still almost daily and vomiting in the last week 6x. N/V/abd-  Nausea better with holding metformin, still having nausea daily and sometimes severe, she describes it worse in am upon waking up, some epigastric abd cramping associated no radiation, no still some indigestion but improving-  protonix seems to be helping a little bit still some reflux 6 x vomiting in last couple days - stomach contents, no diarrhea  She was instructed to f/up with endocrinology regarding sx and meds, she did not and says her husband will "not allow her" to go back to Dr. Loanne Drilling again.  I had asked why this may be explaining how she needs management from a specialist given the extent of her disease and high doses of insulin.  Tried to ask if it was financial?  She mentioned that her husband "did not go for that" referring to weightloss program and bariatric surgery.  Explained to her that this is a possible option down the road that would improve her overall health she should continue to go back to Dr. Loanne Drilling should do nutritional counseling, but explain to Loanne Drilling that she may not be interested in major surgery at this time.  Pt said the co-pay was "$40 - not that much." She denies urinary sx, fever, chills, sweats, weakness.  She thinks shes lost a little more weight.   Her sugars have been in the 70's, does not feel sx of hypoglycemia over the past two weeks, she decreased basaglar to 150 units daily in am.  Still taking victoza 1.8 mg SQ daily.  Not taking metformin. KUB just  completed, reviewed, normal gas/bowel pattern, RUQ shadow   Right low back and buttock pain continues, severe, shooting, sharp, rated 10/10 constant - worse when standing and laying, feels better with sitting.  She can only walk short distances less than 30-50 yards and pain is too severe and she has to rest.  Standing to cook does same after a few minutes.  No radiation to leg, no weakness in leg, no urinary incontinence, saddle anesthesia.  Xrays of right hip, lumbar spine were just done this week, reviewed and are normal.        Patient Active Problem List   Diagnosis Date Noted  . Diabetes mellitus type 2, uncontrolled, without complications ([place]) 37/62/8315  . Insomnia 04/02/2015  . Obesity 09/23/2014  . Vitamin D deficiency 09/23/2014  . Screening for colorectal cancer 09/23/2014  . Asthma   . Anxiety   . Depression   . Diabetes ([place])   . GERD (gastroesophageal reflux disease)   . Hypertension   . Hyperlipidemia      Prior to Admission medications   Medication Sig Start Date End Date Taking? Authorizing Provider  aspirin 81 MG tablet Take 1 tablet (81 mg total) by mouth daily. 11/05/13  Yes Dena Billet B, PA-C  blood glucose meter kit and supplies KIT Inject 1 each into the skin as directed. Dispense based on patient and insurance preference. Check blood  sugar fasting each morning.  ICD E11.65 09/24/14  Yes Dena Billet B, PA-C  Blood Glucose Monitoring Suppl (FREESTYLE LITE) DEVI UAD.E11.65 09/24/14  Yes [provider]  [facility] D3 2000 units CAPS Take 2 capsules (4,000 Units total) by mouth daily. 07/29/16  Yes Dena Billet B, PA-C  glucose blood test strip Dispense based on patient and insurance preference.  Check blood sugar fasting each morning.  ICD E11.65 09/24/14  Yes Orlena Sheldon, PA-C  Insulin Glargine (BASAGLAR KWIKPEN) 100 UNIT/ML SOPN Inject 1.5 mLs (150 Units total) into the skin every morning. 03/15/18  Yes Renato Shin, MD  Insulin Glargine [facility] KWIKPEN)  100 UNIT/ML SOPN INJECT UP TO 200 UNITS SUBCUTANEOUS DAILY 03/20/18  Yes Delsa Grana, PA-C  Lancets MISC Dispense based on patient and insurance preference.  Check blood sugar fasting each morning.  ICD E11.65 09/24/14  Yes Dena Billet B, PA-C  liraglutide (VICTOZA) 18 MG/3ML SOPN Start 0.'6mg'$  subcutaneous daily for 1 week then increase to 1.'2mg'$  subcutaneous daily for 1 week, then increase to 1.'8mg'$  subcutaneous daily 01/26/18  Yes Susy Frizzle, MD  lisinopril-hydrochlorothiazide (PRINZIDE,ZESTORETIC) 20-12.5 MG tablet TAKE 1 TABLET BY MOUTH DAILY. 12/29/17  Yes Dena Billet B, PA-C  LORazepam (ATIVAN) 1 MG tablet Take 1 tablet (1 mg total) by mouth 2 (two) times daily. 04/18/18  Yes Delsa Grana, PA-C  mirtazapine (REMERON) 30 MG tablet TAKE 1 TABLET AT BEDTIME 02/01/18  Yes Susy Frizzle, MD  pantoprazole (PROTONIX) 40 MG tablet Take 1 tablet (40 mg total) by mouth daily. 04/19/18  Yes Delsa Grana, PA-C  rosuvastatin (CRESTOR) 40 MG tablet TAKE 1 TABLET (40 MG TOTAL) BY MOUTH DAILY. 04/25/18  Yes Delsa Grana, PA-C  metFORMIN (GLUCOPHAGE-XR) 500 MG 24 hr tablet Take 1 tablet (500 mg total) by mouth daily with breakfast. Patient not taking: Reported on 05/03/2018 03/15/18   Renato Shin, MD     Allergies  Allergen Reactions  . Contrast Media [Iodinated Diagnostic Agents] Anaphylaxis  . Ambien [Zolpidem Tartrate] Other (See Comments)    Family has told her that after taking Ambien, she does "crazy things" that she does not remember doing.   . Codeine Nausea And Vomiting     Family History  Problem Relation Age of Onset  . Cancer Father        Lymphoma, Leukemia  . Depression Father   . Cancer Maternal Grandfather   . Cancer Paternal Grandmother   . Diabetes Paternal Grandfather   . Kidney disease Paternal Grandfather      Social History   Socioeconomic History  . Marital status: Married    Spouse name: Not on file  . Number of children: Not on file  . Years of education:  Not on file  . Highest education level: Not on file  Occupational History  . Not on file  Social Needs  . Financial resource strain: Not on file  . Food insecurity:    Worry: Not on file    Inability: Not on file  . Transportation needs:    Medical: Not on file    Non-medical: Not on file  Tobacco Use  . Smoking status: Never Smoker  . Smokeless tobacco: Never Used  Substance and Sexual Activity  . Alcohol use: No  . Drug use: No  . Sexual activity: Yes  Lifestyle  . Physical activity:    Days per week: Not on file    Minutes per session: Not on file  . Stress: Not on file  Relationships  .  Social connections:    Talks on phone: Not on file    Gets together: Not on file    Attends religious service: Not on file    Active member of club or organization: Not on file    Attends meetings of clubs or organizations: Not on file    Relationship status: Not on file  . Intimate partner violence:    Fear of current or ex partner: Not on file    Emotionally abused: Not on file    Physically abused: Not on file    Forced sexual activity: Not on file  Other Topics Concern  . Not on file  Social History Narrative   Married.    Lives with husband and youngest son, who is 29 y/o.   Has 4 children.   Does not work outside of home.    No formal exercise. Only activity around the house.   Tries to be careful with Diabetic diet.     Review of Systems  Constitutional: Negative.   HENT: Negative.   Eyes: Negative.   Respiratory: Negative.   Cardiovascular: Negative.   Gastrointestinal: Negative.   Endocrine: Negative.   Genitourinary: Negative.   Musculoskeletal: Negative.   Skin: Negative.   Allergic/Immunologic: Negative.   Neurological: Negative.   Hematological: Negative.   Psychiatric/Behavioral: Negative.   All other systems reviewed and are negative.      Objective:    Vitals:   05/03/18 1052  BP: 130/84  Pulse: 67  Resp: 16  Temp: 97.6 F (36.4 C)    TempSrc: Oral  SpO2: 98%  Weight: 245 lb 2 oz (111.2 kg)  Height: '5\' 3"'$  (1.6 m)     Physical Exam Vitals signs and nursing note reviewed.  Constitutional:      General: She is not in acute distress.    Appearance: She is well-developed. She is obese. She is not ill-appearing, toxic-appearing or diaphoretic.  HENT:     Head: Normocephalic and atraumatic.     Right Ear: External ear normal.     Left Ear: External ear normal.     Nose: Nose normal.     Mouth/Throat:     Mouth: Mucous membranes are moist.     Pharynx: Oropharynx is clear.  Eyes:     General: No scleral icterus.       Right eye: No discharge.        Left eye: No discharge.     Conjunctiva/sclera: Conjunctivae normal.  Neck:     Trachea: No tracheal deviation.  Cardiovascular:     Rate and Rhythm: Normal rate and regular rhythm.     Pulses: Normal pulses.     Heart sounds: No murmur. No friction rub. No gallop.   Pulmonary:     Effort: Pulmonary effort is normal. No respiratory distress.     Breath sounds: Normal breath sounds. No stridor.  Abdominal:     General: Abdomen is protuberant. Bowel sounds are normal. There is no distension or abdominal bruit.     Palpations: Abdomen is soft. There is no pulsatile mass.     Tenderness: There is no abdominal tenderness. There is no right CVA tenderness, left CVA tenderness, guarding or rebound. Negative signs include Murphy's sign and McBurney's sign.  Musculoskeletal:     Right hip: Normal.     Cervical back: Normal.     Thoracic back: Normal.     Lumbar back: She exhibits tenderness. She exhibits normal range of motion, no bony tenderness, no  swelling, no edema and no spasm.       Back:     Comments: Neg SLR b/l ttp to right lumbar paraspinal muscles and to right SI joint area   Skin:    General: Skin is warm and dry.     Findings: No rash.  Neurological:     Mental Status: She is alert.     Sensory: No sensory deficit.     Motor: No weakness or abnormal  muscle tone.     Coordination: Coordination normal.     Gait: Gait normal.  Psychiatric:        Behavior: Behavior normal.           Assessment & Plan:   Pt returns for recheck of several acute complaints - she has still not followed instructions to return to endocrinology with blood sugar and medication concerns and possible SE.    ICD-10-CM   1. Right low back pain, unspecified chronicity, unspecified whether sciatica present M54.5 Ambulatory referral to Physical Therapy  2. Uncontrolled type 2 diabetes mellitus without complication, with long-term current use of insulin (HCC) E11.65    Z79.4   3. Non-intractable vomiting with nausea, unspecified vomiting type R11.2     Right lumbar paraspinal and right SI pain with some radicular symptoms only radiating locally to the right buttocks no sciatica will try short burst of steroids and afterwards NSAIDs as tolerated.  Xrays unremarkable reviewed again with pt in exam room - refer to PT if no improvement, patient may need referral to specialist or MRI of lumbar spine.  Blood sugar issues medications she has been again instructed to go back to Dr. Loanne Drilling to address  She continues to have nausea and vomiting although it has improved a little bit with Protonix and with holding metformin.  I am concerned that Victoza may be part of it delaying gastric emptying again I would like the endocrinologist to evaluate and manage this, and I do not want to change any other diabetes medications at this time.  She says her husband will not "allow her" to go back to the endocrinologist and have tried to figure out what this exactly means and I feel that it may be because he does not want her to have surgery or weight loss surgery.  Told the patient to explained to the specialist that she currently does not want a look into bariatric surgery but that I need her to see the endocrinologist for managing her DM.    Patient will encouraged to continue treatment  for possible GERD or gastritis causing some nausea, any Protonix and food and lifestyle changes for treatment of GERD follow-up if no improvement.  If we can address possible side effects of diabetes medications, treat any possible GERD or peptic ulcer disease, and patient symptoms do not improve she may need GI referral as well.    If she need further nausea meds, currently she says she doesn't, would want repeat EKG to recheck QTc.  There was abnormality noted on plain films I did discuss this with her, renal ultrasound was recommended has already been ordered, plan depending on results.      Delsa Grana, PA-C 05/03/18 11:01 AM

## 2018-05-09 ENCOUNTER — Ambulatory Visit
Admission: RE | Admit: 2018-05-09 | Discharge: 2018-05-09 | Disposition: A | Discharge status: Home / Self Care | Payer: 59 | Source: Ambulatory Visit | Attending: Family Medicine | Admitting: Family Medicine

## 2018-05-09 DIAGNOSIS — R1901 Right upper quadrant abdominal swelling, mass and lump: Secondary | ICD-10-CM | POA: Diagnosis not present

## 2018-05-15 ENCOUNTER — Ambulatory Visit: Payer: 59 | Attending: Family Medicine | Admitting: Physical Therapy

## 2018-05-15 ENCOUNTER — Telehealth: Payer: Self-pay | Admitting: Physical Therapy

## 2018-05-15 NOTE — Telephone Encounter (Signed)
Pt no show for first PT appointment today. They where contacted and informed of this. They where given number to call front office to reschedule. Ivery QualeBrian Tyriq Moragne, PT, DPT 05/15/18 3:13 PM

## 2018-05-16 ENCOUNTER — Ambulatory Visit: Payer: 59 | Admitting: Endocrinology

## 2018-05-20 ENCOUNTER — Other Ambulatory Visit: Payer: Self-pay | Admitting: Family Medicine

## 2018-05-24 NOTE — Telephone Encounter (Signed)
Left message return call

## 2018-05-24 NOTE — Telephone Encounter (Signed)
Please call and tell the pt that I have put in one refill on her Ativan - I do see that she has been on it for a long time, at the same dose.  It is my policy not to prescribe chronic benzo's and she will need to establish with psychiatry to address (or other specialist depending on why she's on it).  It is not first line for anxiety disorders, and it is habit forming, sedating and can cause seizures if she withdrawals from it.  So we would like to help her address the dx of why shes on it - something is undercontrolled.   She can, of course, switch to one of the MD's here or elsewhere because they may have different prescribing policies than I do.    (handicap form up front for her)  Can you also ask her if she has gone back to endocrinology - or if she's called them to ask about the daily N/V and her DM meds?  If the med is for anxiety can you please put in a referral to psychiatry for anxiety disorder, unspecified, and for high risk medication use (benzo's - if they have that dx).  thanks

## 2018-05-26 NOTE — Telephone Encounter (Signed)
Mychart message sent to patient requesting that she return my call.

## 2018-05-27 ENCOUNTER — Other Ambulatory Visit: Payer: Self-pay | Admitting: Family Medicine

## 2018-05-29 NOTE — Telephone Encounter (Signed)
Last filled: 05/03/2018 Last office visit: 05/03/2018

## 2018-06-05 ENCOUNTER — Encounter: Payer: Self-pay | Admitting: Endocrinology

## 2018-06-05 ENCOUNTER — Ambulatory Visit (INDEPENDENT_AMBULATORY_CARE_PROVIDER_SITE_OTHER): Payer: 59 | Admitting: Endocrinology

## 2018-06-05 VITALS — BP 90/50 | HR 69 | Ht 63.0 in | Wt 243.8 lb

## 2018-06-05 DIAGNOSIS — E1165 Type 2 diabetes mellitus with hyperglycemia: Secondary | ICD-10-CM | POA: Diagnosis not present

## 2018-06-05 DIAGNOSIS — IMO0001 Reserved for inherently not codable concepts without codable children: Secondary | ICD-10-CM

## 2018-06-05 LAB — POCT GLYCOSYLATED HEMOGLOBIN (HGB A1C): Hemoglobin A1C: 5.7 % — AB (ref 4.0–5.6)

## 2018-06-05 MED ORDER — BASAGLAR KWIKPEN 100 UNIT/ML ~~LOC~~ SOPN: 140.0000 [IU] | PEN_INJECTOR | SUBCUTANEOUS | 3 refills | Status: DC

## 2018-06-05 MED ORDER — LIRAGLUTIDE 18 MG/3ML ~~LOC~~ SOPN: 1.8000 mg | PEN_INJECTOR | Freq: Every day | SUBCUTANEOUS | 11 refills | Status: DC

## 2018-06-05 NOTE — Progress Notes (Signed)
Subjective:    [name], female    DOB: 01-09-1961, 58 y.o.   MRN: [id number]  HPI Pt returns for f/u of diabetes mellitus: DM type: Insulin-requiring type 2.  Dx'ed: 8416 Complications: none Therapy: insulin since 2018 GDM: 1988 DKA: never Severe hypoglycemia: never Pancreatitis: never Pancreatic imaging: normal on 2013 CT Other: she takes qd insulin, at least for now; she did not tolerate Invokana (vaginitis) Interval history: Meter is downloaded today, and the printout is scanned into the record.  cbg's vary from 70-140 fasting.  She does not check later in the day.   Past Medical History:  Diagnosis Date  . Allergy   . Anxiety 2013  . Asthma 2015  . Asthma   . Depression 2013  . Diabetes mellitus 2004  . GERD (gastroesophageal reflux disease) 2013  . Hyperlipidemia 1992  . Hypertension 1992  . Memory impairment     Past Surgical History:  Procedure Laterality Date  . ANTERIOR CRUCIATE LIGAMENT REPAIR  1997  . APPENDECTOMY  1987  . CESAREAN SECTION     [id number]  . CHOLECYSTECTOMY  1987  . uterine ablation      Social History   Socioeconomic History  . Marital status: Married    Spouse name: Not on file  . Number of children: Not on file  . Years of education: Not on file  . Highest education level: Not on file  Occupational History  . Not on file  Social Needs  . Financial resource strain: Not on file  . Food insecurity:    Worry: Not on file    Inability: Not on file  . Transportation needs:    Medical: Not on file    Non-medical: Not on file  Tobacco Use  . Smoking status: Never Smoker  . Smokeless tobacco: Never Used  Substance and Sexual Activity  . Alcohol use: No  . Drug use: No  . Sexual activity: Yes  Lifestyle  . Physical activity:    Days per week: Not on file    Minutes per session: Not on file  . Stress: Not on file  Relationships  . Social connections:    Talks on phone: Not on file    Gets together: Not  on file    Attends religious service: Not on file    Active member of club or organization: Not on file    Attends meetings of clubs or organizations: Not on file    Relationship status: Not on file  . Intimate partner violence:    Fear of current or ex partner: Not on file    Emotionally abused: Not on file    Physically abused: Not on file    Forced sexual activity: Not on file  Other Topics Concern  . Not on file  Social History Narrative   Married.    Lives with husband and youngest son, who is 93 y/o.   Has 4 children.   Does not work outside of home.    No formal exercise. Only activity around the house.   Tries to be careful with Diabetic diet.    Current Outpatient Medications on File Prior to Visit  Medication Sig Dispense Refill  . aspirin 81 MG tablet Take 1 tablet (81 mg total) by mouth daily. 30 tablet 11  . blood glucose meter kit and supplies KIT Inject 1 each into the skin as directed. Dispense based on patient and insurance preference. Check blood sugar fasting each morning.  ICD E11.65 1 each 0  . Blood Glucose Monitoring Suppl (FREESTYLE LITE) DEVI UAD.E11.65  0  . [facility] D3 2000 units CAPS Take 2 capsules (4,000 Units total) by mouth daily. 30 each 3  . glucose blood test strip Dispense based on patient and insurance preference.  Check blood sugar fasting each morning.  ICD E11.65 50 each 12  . Lancets MISC Dispense based on patient and insurance preference.  Check blood sugar fasting each morning.  ICD E11.65 100 each 3  . lisinopril-hydrochlorothiazide (PRINZIDE,ZESTORETIC) 20-12.5 MG tablet TAKE 1 TABLET BY MOUTH DAILY. 90 tablet 3  . LORazepam (ATIVAN) 1 MG tablet Take 1 tablet (1 mg total) by mouth 2 (two) times daily as needed for anxiety. Gradually taper down dose as tolerated 60 tablet 0  . metFORMIN (GLUCOPHAGE-XR) 500 MG 24 hr tablet Take 1 tablet (500 mg total) by mouth daily with breakfast. 90 tablet 3  . mirtazapine (REMERON) 30 MG tablet TAKE 1 TABLET AT  BEDTIME 90 tablet 0  . pantoprazole (PROTONIX) 40 MG tablet Take 1 tablet (40 mg total) by mouth daily. 30 tablet 3  . rosuvastatin (CRESTOR) 40 MG tablet TAKE 1 TABLET (40 MG TOTAL) BY MOUTH DAILY. 30 tablet 4  . tizanidine (ZANAFLEX) 2 MG capsule Take 1 capsule (2 mg total) by mouth 3 (three) times daily as needed for muscle spasms. 90 capsule 1   No current facility-administered medications on file prior to visit.     Allergies  Allergen Reactions  . Contrast Media [Iodinated Diagnostic Agents] Anaphylaxis  . Ambien [Zolpidem Tartrate] Other (See Comments)    Family has told her that after taking Ambien, she does "crazy things" that she does not remember doing.   . Codeine Nausea And Vomiting    Family History  Problem Relation Age of Onset  . Cancer Father        Lymphoma, Leukemia  . Depression Father   . Cancer Maternal Grandfather   . Cancer Paternal Grandmother   . Diabetes Paternal Grandfather   . Kidney disease Paternal Grandfather     BP (!) 90/50 (BP Location: Right Arm, Patient Position: Sitting, Cuff Size: Large)   Pulse 69   Ht '5\' 3"'$  (1.6 m)   Wt 243 lb 12.8 oz (110.6 kg)   SpO2 95%   BMI 43.19 kg/m   Review of Systems She denies hypoglycemia    Objective:   Physical Exam VITAL SIGNS:  See vs page GENERAL: no distress Pulses: dorsalis pedis intact bilat.   MSK: no deformity of the feet CV: trace bilat leg edema Skin:  no ulcer on the feet.  normal color and temp on the feet. Neuro: sensation is intact to touch on the feet  Lab Results  Component Value Date   HGBA1C 5.7 (A) 06/05/2018       Assessment & Plan:  Insulin-requiring type 2 DM: overcontrolled.   Patient Instructions  Your blood pressure is slightly low today.  Please see your primary care provider soon, to have it rechecked check your blood sugar twice a day.  vary the time of day when you check, between before the 3 meals, and at bedtime.  also check if you have symptoms of your  blood sugar being too high or too low.  please keep a record of the readings and bring it to your next appointment here (or you can bring the meter itself).  You can write it on any piece of paper.  please call us sooner if  your blood sugar goes below 70, or if you have a lot of readings over 200. On this type of insulin schedule, you should eat meals on a regular schedule.  If a meal is missed or significantly delayed, your blood sugar could go low. Please reduce the insulin to 140 units daily, and: Please continue the same other diabetes medications.  Please come back for a follow-up appointment in 2-3 months.

## 2018-06-05 NOTE — Patient Instructions (Addendum)
Your blood pressure is slightly low today.  Please see your primary care provider soon, to have it rechecked check your blood sugar twice a day.  vary the time of day when you check, between before the 3 meals, and at bedtime.  also check if you have symptoms of your blood sugar being too high or too low.  please keep a record of the readings and bring it to your next appointment here (or you can bring the meter itself).  You can write it on any piece of paper.  please call us sooner if your blood sugar goes below 70, or if you have a lot of readings over 200. On this type of insulin schedule, you should eat meals on a regular schedule.  If a meal is missed or significantly delayed, your blood sugar could go low. Please reduce the insulin to 140 units daily, and: Please continue the same other diabetes medications.  Please come back for a follow-up appointment in 2-3 months.

## 2018-06-05 NOTE — Telephone Encounter (Signed)
Left message return call

## 2018-06-06 ENCOUNTER — Encounter: Payer: Self-pay | Admitting: Family Medicine

## 2018-06-06 ENCOUNTER — Ambulatory Visit: Payer: 59 | Admitting: Family Medicine

## 2018-06-06 VITALS — BP 100/62 | HR 69 | Temp 97.6°F | Resp 15 | Ht 63.0 in | Wt 245.4 lb

## 2018-06-06 DIAGNOSIS — I10 Essential (primary) hypertension: Secondary | ICD-10-CM | POA: Diagnosis not present

## 2018-06-06 DIAGNOSIS — E119 Type 2 diabetes mellitus without complications: Secondary | ICD-10-CM | POA: Diagnosis not present

## 2018-06-06 DIAGNOSIS — IMO0001 Reserved for inherently not codable concepts without codable children: Secondary | ICD-10-CM

## 2018-06-06 DIAGNOSIS — E1165 Type 2 diabetes mellitus with hyperglycemia: Secondary | ICD-10-CM | POA: Diagnosis not present

## 2018-06-06 DIAGNOSIS — E785 Hyperlipidemia, unspecified: Secondary | ICD-10-CM | POA: Diagnosis not present

## 2018-06-06 DIAGNOSIS — F419 Anxiety disorder, unspecified: Secondary | ICD-10-CM

## 2018-06-06 DIAGNOSIS — Z794 Long term (current) use of insulin: Secondary | ICD-10-CM

## 2018-06-06 NOTE — Patient Instructions (Addendum)
We will decrease your BP meds, monitor your blood pressure, call me if lower than 100/60  Once I get your labs I will send through a new BP pill and you can stop the combo pill.  Keep monitoring your blood sugar and if it goes below 70, or below 80 with symptoms, I would decrease your insulin dose by 5 units to see how it responds.  Must wait 3-7 days between adjustments since it is a long acting medicine - see handout for low blood sugar.  3-4 months follow up
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
## 2018-06-06 NOTE — Telephone Encounter (Signed)
Spoke with patient and informed her as listed below. Patient discussed with Sheliah Mends at office visit today.

## 2018-06-07 LAB — LIPID PANEL
Cholesterol: 154 mg/dL (ref <200)
HDL: 54 mg/dL (ref >=50)
LDL Cholesterol (Calc): 76 mg/dL (calc)
Non-HDL Cholesterol (Calc): 100 mg/dL (calc) (ref <130)
Total CHOL/HDL Ratio: 2.9 (calc) (ref <5.0)
Triglycerides: 144 mg/dL (ref <150)

## 2018-06-07 LAB — COMPLETE METABOLIC PANEL WITH GFR
AG Ratio: 1.5 (calc) (ref 1.0–2.5)
ALBUMIN MSPROF: 4.3 g/dL (ref 3.6–5.1)
ALT: 18 U/L (ref 6–29)
AST: 20 U/L (ref 10–35)
Alkaline phosphatase (APISO): 49 U/L (ref 37–153)
BUN: 12 mg/dL (ref 7–25)
CO2: 27 mmol/L (ref 20–32)
Calcium: 9.6 mg/dL (ref 8.6–10.4)
Chloride: 104 mmol/L (ref 98–110)
Creat: 0.8 mg/dL (ref 0.50–1.05)
GFR, Est African American: 95 mL/min/{1.73_m2} (ref >=60)
GFR, Est Non African American: 82 mL/min/{1.73_m2} (ref >=60)
Globulin: 2.9 g/dL (calc) (ref 1.9–3.7)
Glucose, Bld: 85 mg/dL (ref 65–99)
Potassium: 4.3 mmol/L (ref 3.5–5.3)
Sodium: 141 mmol/L (ref 135–146)
Total Bilirubin: 0.7 mg/dL (ref 0.2–1.2)
Total Protein: 7.2 g/dL (ref 6.1–8.1)

## 2018-06-07 MED ORDER — LISINOPRIL 10 MG PO TABS: 20.0000 mg | ORAL_TABLET | Freq: Every day | ORAL | 0 refills | Status: DC

## 2018-06-08 ENCOUNTER — Other Ambulatory Visit: Payer: Self-pay

## 2018-06-08 MED ORDER — TIZANIDINE HCL 2 MG PO CAPS: 2.0000 mg | ORAL_CAPSULE | Freq: Three times a day (TID) | ORAL | 1 refills | Status: DC | PRN
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
## 2018-06-09 NOTE — Assessment & Plan Note (Signed)
Managed by endocrinology - A1C much improved! Some low readings with fasting CBG Patient given a handout on low blood sugar, with fasting CBG under 70 I did instruct patient to decrease her insulin by 5 units and monitor for the next 3 to 7 days.  She will need to contact endocrinology about this

## 2018-06-09 NOTE — Assessment & Plan Note (Signed)
BP soft today, she has been losing weight, having some possible symptoms may be from low blood sugar or from soft BP, she is taking medicine as prescribed, no other side effects noted, will check renal function and will decrease her medication dose and monitor closely that she stays at goal Today 100/62

## 2018-06-09 NOTE — Assessment & Plan Note (Signed)
Patient compliant with medications, no side effects, no myalgias or fatigue She is losing weight, joint and back pain are starting to improve she was encouraged to add exercise to healthy diet  Recheck CMP and fasting lipid panel today

## 2018-06-14 ENCOUNTER — Telehealth: Payer: Self-pay | Admitting: Endocrinology

## 2018-06-14 NOTE — Telephone Encounter (Signed)
Al in that class are expensive.  Which does ins prefer?

## 2018-06-14 NOTE — Telephone Encounter (Signed)
Called pt and made her aware. States she will discuss with her insurance company to discuss further.

## 2018-06-14 NOTE — Telephone Encounter (Signed)
Please advise 

## 2018-06-14 NOTE — Telephone Encounter (Signed)
°  Patient stated that she went to get a refill on her Victoza and that it would cost her to much. She wanted to see if there was an alternative that Dr Everardo All would prescribe for her.  Please advise

## 2018-06-21 ENCOUNTER — Other Ambulatory Visit: Payer: Self-pay | Admitting: Family Medicine

## 2018-06-22 NOTE — Telephone Encounter (Signed)
I have tried to call the pt today - do mind trying to call and reach her and grab me - I want to talk to her over the phone and see how she is doing.  Thanks

## 2018-06-22 NOTE — Telephone Encounter (Signed)
I tried calling pt and it sounds like her phone has been disconnected. Will try back later.

## 2018-06-22 NOTE — Telephone Encounter (Signed)
Requested Prescriptions   Pending Prescriptions Disp Refills  . LORazepam (ATIVAN) 1 MG tablet [Pharmacy Med Name: LORAZEPAM 1 MG TABLET] 30 tablet 0    Sig: TAKE 1 TABLET BY MOUTH 2 TIMES DAILY AS NEEDED FOR ANXIETY. GRADUALLY TAPER DOWN DOSE AS TOLERATED   Pt was last seen on 05/03/2018 for back pain and nausea/vomiting.

## 2018-06-27 NOTE — Telephone Encounter (Signed)
Have we tried Ms. Bentham again?   Please document each time you try and do not have success reaching pt.  Can also reach out to other phone # or emergency contacts on chart.

## 2018-07-03 ENCOUNTER — Other Ambulatory Visit: Payer: Self-pay | Admitting: Family Medicine

## 2018-07-15 ENCOUNTER — Other Ambulatory Visit: Payer: Self-pay | Admitting: Family Medicine

## 2018-07-15 DIAGNOSIS — I10 Essential (primary) hypertension: Secondary | ICD-10-CM

## 2018-07-19 ENCOUNTER — Other Ambulatory Visit: Payer: Self-pay | Admitting: Family Medicine

## 2018-07-27 ENCOUNTER — Other Ambulatory Visit: Payer: Self-pay | Admitting: Family Medicine

## 2018-07-28 NOTE — Telephone Encounter (Signed)
Requested Prescriptions   Pending Prescriptions Disp Refills  . LORazepam (ATIVAN) 1 MG tablet [Pharmacy Med Name: LORAZEPAM 1 MG TABLET] 60 tablet 0    Sig: TAKE 1/2 TO 1 TABLET BY MOUTH TWICE A DAY AS NEEDED FOR ANXIETY   Last OV 06/06/18 Last filled 06/23/2018

## 2018-08-24 ENCOUNTER — Other Ambulatory Visit: Payer: Self-pay | Admitting: Family Medicine

## 2018-08-24 DIAGNOSIS — I10 Essential (primary) hypertension: Secondary | ICD-10-CM

## 2018-08-29 ENCOUNTER — Ambulatory Visit
Admission: RE | Admit: 2018-08-29 | Discharge: 2018-08-29 | Disposition: A | Discharge status: Home / Self Care | Payer: 59 | Source: Ambulatory Visit | Attending: Orthopedic Surgery | Admitting: Orthopedic Surgery

## 2018-08-29 ENCOUNTER — Other Ambulatory Visit: Payer: Self-pay | Admitting: Orthopedic Surgery

## 2018-08-29 ENCOUNTER — Other Ambulatory Visit: Payer: Self-pay

## 2018-08-29 DIAGNOSIS — M48061 Spinal stenosis, lumbar region without neurogenic claudication: Secondary | ICD-10-CM | POA: Diagnosis not present

## 2018-08-29 DIAGNOSIS — M545 Low back pain, unspecified: Secondary | ICD-10-CM

## 2018-08-29 DIAGNOSIS — R52 Pain, unspecified: Secondary | ICD-10-CM

## 2018-08-31 ENCOUNTER — Other Ambulatory Visit: Payer: Self-pay | Admitting: Orthopedic Surgery

## 2018-08-31 DIAGNOSIS — M545 Low back pain, unspecified: Secondary | ICD-10-CM

## 2018-09-02 ENCOUNTER — Other Ambulatory Visit: Payer: Self-pay | Admitting: Family Medicine

## 2018-09-04 NOTE — Telephone Encounter (Signed)
Last office visit: 07/28/2018 Last office visit: 06/06/2018

## 2018-09-05 ENCOUNTER — Ambulatory Visit (INDEPENDENT_AMBULATORY_CARE_PROVIDER_SITE_OTHER): Payer: 59 | Admitting: Endocrinology

## 2018-09-05 ENCOUNTER — Ambulatory Visit: Payer: 59 | Admitting: Endocrinology

## 2018-09-05 ENCOUNTER — Other Ambulatory Visit: Payer: Self-pay

## 2018-09-05 DIAGNOSIS — Z794 Long term (current) use of insulin: Secondary | ICD-10-CM

## 2018-09-05 DIAGNOSIS — E1165 Type 2 diabetes mellitus with hyperglycemia: Secondary | ICD-10-CM | POA: Diagnosis not present

## 2018-09-05 DIAGNOSIS — E119 Type 2 diabetes mellitus without complications: Secondary | ICD-10-CM

## 2018-09-05 MED ORDER — BASAGLAR KWIKPEN 100 UNIT/ML ~~LOC~~ SOPN: 130.0000 [IU] | PEN_INJECTOR | SUBCUTANEOUS | 3 refills | Status: DC

## 2018-09-05 NOTE — Progress Notes (Signed)
Subjective:    [name], female    DOB: 02/15/1961, 58 y.o.   MRN: [id number]  HPI  telehealth visit today via phone x 6 minutes.   Alternatives to telehealth are presented to this patient, and the patient agrees to the telehealth visit.  Pt is advised of the cost of the visit, and agrees to this, also.   Patient is at home, and I am at the office.   Persons attending the telehealth visit: the patient and I.   Pt returns for f/u of diabetes mellitus: DM type: Insulin-requiring type 2.  Dx'ed: 7628 Complications: none Therapy: insulin since 2018 GDM: 1988 DKA: never Severe hypoglycemia: never Pancreatitis: never Pancreatic imaging: normal on 2013 CT Other: she takes qd insulin, at least for now; she did not tolerate Invokana (vaginitis); she stopped victoza, due to cost.   Interval history: Meter is downloaded today, and the printout is scanned into the record.  cbg's vary from 70-140 fasting.  She does not check later in the day.   Pt says cbg varies from 52-120.  It is in general higher as the day goes on.  pt states she feels well in general.  Past Medical History:  Diagnosis Date  . Allergy   . Anxiety 2013  . Asthma 2015  . Asthma   . Depression 2013  . Diabetes mellitus 2004  . GERD (gastroesophageal reflux disease) 2013  . Hyperlipidemia 1992  . Hypertension 1992  . Memory impairment     Past Surgical History:  Procedure Laterality Date  . ANTERIOR CRUCIATE LIGAMENT REPAIR  1997  . APPENDECTOMY  1987  . CESAREAN SECTION     [phone]  . CHOLECYSTECTOMY  1987  . uterine ablation      Social History   Socioeconomic History  . Marital status: Married    Spouse name: Not on file  . Number of children: Not on file  . Years of education: Not on file  . Highest education level: Not on file  Occupational History  . Not on file  Social Needs  . Financial resource strain: Not on file  . Food insecurity:    Worry: Not on file   Inability: Not on file  . Transportation needs:    Medical: Not on file    Non-medical: Not on file  Tobacco Use  . Smoking status: Never Smoker  . Smokeless tobacco: Never Used  Substance and Sexual Activity  . Alcohol use: No  . Drug use: No  . Sexual activity: Yes  Lifestyle  . Physical activity:    Days per week: Not on file    Minutes per session: Not on file  . Stress: Not on file  Relationships  . Social connections:    Talks on phone: Not on file    Gets together: Not on file    Attends religious service: Not on file    Active member of club or organization: Not on file    Attends meetings of clubs or organizations: Not on file    Relationship status: Not on file  . Intimate partner violence:    Fear of current or ex partner: Not on file    Emotionally abused: Not on file    Physically abused: Not on file    Forced sexual activity: Not on file  Other Topics Concern  . Not on file  Social History Narrative   Married.    Lives with husband and youngest son, who is 17  y/o.   Has 4 children.   Does not work outside of home.    No formal exercise. Only activity around the house.   Tries to be careful with Diabetic diet.    Current Outpatient Medications on File Prior to Visit  Medication Sig Dispense Refill  . aspirin 81 MG tablet Take 1 tablet (81 mg total) by mouth daily. 30 tablet 11  . blood glucose meter kit and supplies KIT Inject 1 each into the skin as directed. Dispense based on patient and insurance preference. Check blood sugar fasting each morning.  ICD E11.65 1 each 0  . Blood Glucose Monitoring Suppl (FREESTYLE LITE) DEVI UAD.E11.65  0  . [facility] D3 2000 units CAPS Take 2 capsules (4,000 Units total) by mouth daily. 30 each 3  . glucose blood test strip Dispense based on patient and insurance preference.  Check blood sugar fasting each morning.  ICD E11.65 50 each 12  . Lancets MISC Dispense based on patient and insurance preference.  Check blood sugar  fasting each morning.  ICD E11.65 100 each 3  . lisinopril (ZESTRIL) 10 MG tablet TAKE 2 TABLETS BY MOUTH EVERY DAY 60 tablet 3  . LORazepam (ATIVAN) 1 MG tablet TAKE 1/2 TO 1TAB BY MOUTH 2 X DAILY AS NEEDED FOR ANXIETY (SPARINGLY NEED TO TAPER SO NOT DAILY USE) 55 tablet 0  . metFORMIN (GLUCOPHAGE-XR) 500 MG 24 hr tablet Take 1 tablet (500 mg total) by mouth daily with breakfast. 90 tablet 3  . pantoprazole (PROTONIX) 40 MG tablet Take 1 tablet (40 mg total) by mouth daily. 30 tablet 3  . rosuvastatin (CRESTOR) 40 MG tablet TAKE 1 TABLET BY MOUTH EVERY DAY 90 tablet 0  . tizanidine (ZANAFLEX) 2 MG capsule Take 1 capsule (2 mg total) by mouth 3 (three) times daily as needed for muscle spasms. 90 capsule 1  . mirtazapine (REMERON) 30 MG tablet TAKE 1 TABLET BY MOUTH EVERYDAY AT BEDTIME 90 tablet 0   No current facility-administered medications on file prior to visit.     Allergies  Allergen Reactions  . Contrast Media [Iodinated Diagnostic Agents] Anaphylaxis  . Ambien [Zolpidem Tartrate] Other (See Comments)    Family has told her that after taking Ambien, she does "crazy things" that she does not remember doing.   . Codeine Nausea And Vomiting    Family History  Problem Relation Age of Onset  . Cancer Father        Lymphoma, Leukemia  . Depression Father   . Cancer Maternal Grandfather   . Cancer Paternal Grandmother   . Diabetes Paternal Grandfather   . Kidney disease Paternal Grandfather     Review of Systems She denies LOC    Objective:   Physical Exam    Lab Results  Component Value Date   CREATININE 0.80 06/06/2018   BUN 12 06/06/2018   NA 141 06/06/2018   K 4.3 06/06/2018   CL 104 06/06/2018   CO2 27 06/06/2018      Assessment & Plan:  Insulin-requiring type 2 DM: apparently overcontrolled.  Hypoglycemia: This limits aggressiveness of glycemic control.    Patient Instructions  check your blood sugar twice a day.  vary the time of day when you check,  between before the 3 meals, and at bedtime.  also check if you have symptoms of your blood sugar being too high or too low.  please keep a record of the readings and bring it to your next appointment here (or you  can bring the meter itself).  You can write it on any piece of paper.  please call us sooner if your blood sugar goes below 70, or if you have a lot of readings over 200. On this type of insulin schedule, you should eat meals on a regular schedule.  If a meal is missed or significantly delayed, your blood sugar could go low. Please reduce the insulin to 130 units daily, and:  Please continue the same other diabetes medications.   Please come back for a follow-up appointment in 2 months.

## 2018-09-05 NOTE — Patient Instructions (Addendum)
check your blood sugar twice a day.  vary the time of day when you check, between before the 3 meals, and at bedtime.  also check if you have symptoms of your blood sugar being too high or too low.  please keep a record of the readings and bring it to your next appointment here (or you can bring the meter itself).  You can write it on any piece of paper.  please call us sooner if your blood sugar goes below 70, or if you have a lot of readings over 200. On this type of insulin schedule, you should eat meals on a regular schedule.  If a meal is missed or significantly delayed, your blood sugar could go low. Please reduce the insulin to 130 units daily, and:  Please continue the same other diabetes medications.   Please come back for a follow-up appointment in 2 months.

## 2018-09-12 ENCOUNTER — Ambulatory Visit (INDEPENDENT_AMBULATORY_CARE_PROVIDER_SITE_OTHER): Payer: 59 | Admitting: Family Medicine

## 2018-09-12 ENCOUNTER — Other Ambulatory Visit: Payer: Self-pay

## 2018-09-12 ENCOUNTER — Encounter: Payer: Self-pay | Admitting: Family Medicine

## 2018-09-12 VITALS — BP 118/70 | HR 61 | Temp 97.4°F | Resp 18 | Ht 62.0 in | Wt 252.8 lb

## 2018-09-12 DIAGNOSIS — R109 Unspecified abdominal pain: Secondary | ICD-10-CM

## 2018-09-12 LAB — URINALYSIS, ROUTINE W REFLEX MICROSCOPIC
Bilirubin Urine: NEGATIVE
Glucose, UA: NEGATIVE
Hgb urine dipstick: NEGATIVE
Leukocytes,Ua: NEGATIVE
Nitrite: NEGATIVE
Protein, ur: NEGATIVE
Specific Gravity, Urine: 1.029 (ref 1.001–1.03)
pH: 5.5 (ref 5.0–8.0)

## 2018-09-12 NOTE — Patient Instructions (Addendum)
Will check urine to make sure nothing infectious causing pain.  Usually if I can press on your body and reproduce the pain it is musculoskeletal - your bones, muscles, cartilage etc.    The images show nothing abnormal at the bottom of your lungs, in your liver, kidney or adrenal glands.

## 2018-09-12 NOTE — Progress Notes (Signed)
[name], female    DOB: Aug 03, 1960, 58 y.o.   MRN: [id number]  PCP: Delsa Grana, PA-C  Chief Complaint  Patient presents with  . Results    Subjective:   Alicia Pope is a 58 y.o. female, presents to clinic to go over imaging results.  She states that she was told that she has a mass in the right upper quadrant and had multiple test done by Ortho, told to come here to get results.  I have not seen any office visit notes from orthopedics.  She does bring in a disc with her today which I am unable to view because of lack of computer equipment to bring up images.  Patient states that she has been freaking out for 2 weeks worried that she has cancer, and she states that she was told multiple times that there is a mass.  I did review her medical history and past x-rays and ultrasounds that we had done earlier this year in January and February.  On a x-ray done in January there was a shadow suggestive of a mass we did a follow-up renal ultrasound which did not show anything there, she had no abdominal pain and there was no further work-up indicated or required at that time.  Today she states that she has pain in her right flank she has no urinary symptoms, no respiratory symptoms.  She states that with certain movement it hurts worse, it is somewhat moderate in severity and intermittent.   She denies fever, chills, sweats, unintentional weight loss, cough, wheeze, shortness of breath, chest pain, abdominal pain, dysuria, hematuria, dyspepsia, abdominal bloating, nausea, vomiting, diarrhea. Ortho was working up her lumbar spine  OV on 05/03/2018: "KUB just completed, reviewed, normal gas/bowel pattern, RUQ shadow   Right low back and buttock pain continues, severe, shooting, sharp, rated 10/10 constant - worse when standing and laying, feels better with sitting.  She can only walk short distances less than 30-50 yards and pain is too severe and she has to rest.  Standing  to cook does same after a few minutes.  No radiation to leg, no weakness in leg, no urinary incontinence, saddle anesthesia.  Xrays of right hip, lumbar spine were just done this week, reviewed and are normal."  PT referral was placed for right low back and hip pain, it was cancelled due to COVID restrictions, then she was sent to ortho.  She states her visit with them was 2 weeks ago.  MRI and CT done 5/26.  Xrays not available in EMR, done in ortho office. In EMR and in clinic/office I have been unable to locate copy of ortho visits in the past couple months.  Only available documents are scan of order written for [facility], which notes RUQ mass and abd pain, and CT abd/pelvis and MRI lumbar spine ordered.     Patient Active Problem List   Diagnosis Date Noted  . Diabetes ([place]) 04/29/2016  . Insomnia 04/02/2015  . Obesity 09/23/2014  . Vitamin D deficiency 09/23/2014  . Screening for colorectal cancer 09/23/2014  . Anxiety   . Depression   . GERD (gastroesophageal reflux disease)   . Hypertension   . Hyperlipidemia      Prior to Admission medications   Medication Sig Start Date End Date Taking? Authorizing Provider  aspirin 81 MG tablet Take 1 tablet (81 mg total) by mouth daily. 11/05/13  Yes Dena Billet B, PA-C  blood glucose meter kit  and supplies KIT Inject 1 each into the skin as directed. Dispense based on patient and insurance preference. Check blood sugar fasting each morning.  ICD E11.65 09/24/14  Yes Dena Billet B, PA-C  Blood Glucose Monitoring Suppl (FREESTYLE LITE) DEVI UAD.E11.65 09/24/14  Yes [provider]  [employer] D3 2000 units CAPS Take 2 capsules (4,000 Units total) by mouth daily. 07/29/16  Yes Dena Billet B, PA-C  glucose blood test strip Dispense based on patient and insurance preference.  Check blood sugar fasting each morning.  ICD E11.65 09/24/14  Yes Orlena Sheldon, PA-C  Insulin Glargine (BASAGLAR KWIKPEN) 100 UNIT/ML SOPN Inject 1.3 mLs (130 Units  total) into the skin every morning. 09/05/18  Yes Renato Shin, MD  Lancets MISC Dispense based on patient and insurance preference.  Check blood sugar fasting each morning.  ICD E11.65 09/24/14  Yes Dena Billet B, PA-C  lisinopril (ZESTRIL) 10 MG tablet TAKE 2 TABLETS BY MOUTH EVERY DAY 08/24/18  Yes Delsa Grana, PA-C  LORazepam (ATIVAN) 1 MG tablet TAKE 1/2 TO 1TAB BY MOUTH 2 X DAILY AS NEEDED FOR ANXIETY (SPARINGLY NEED TO TAPER SO NOT DAILY USE) 09/04/18  Yes [place], Modena Nunnery, MD  metFORMIN (GLUCOPHAGE-XR) 500 MG 24 hr tablet Take 1 tablet (500 mg total) by mouth daily with breakfast. 03/15/18  Yes Renato Shin, MD  mirtazapine (REMERON) 30 MG tablet TAKE 1 TABLET BY MOUTH EVERYDAY AT BEDTIME 09/05/18  Yes Susy Frizzle, MD  pantoprazole (PROTONIX) 40 MG tablet Take 1 tablet (40 mg total) by mouth daily. 04/19/18  Yes Delsa Grana, PA-C  rosuvastatin (CRESTOR) 40 MG tablet TAKE 1 TABLET BY MOUTH EVERY DAY 07/19/18  Yes Delsa Grana, PA-C  tizanidine (ZANAFLEX) 2 MG capsule Take 1 capsule (2 mg total) by mouth 3 (three) times daily as needed for muscle spasms. 06/08/18  Yes Delsa Grana, PA-C     Allergies  Allergen Reactions  . Contrast Media [Iodinated Diagnostic Agents] Anaphylaxis  . Ambien [Zolpidem Tartrate] Other (See Comments)    Family has told her that after taking Ambien, she does "crazy things" that she does not remember doing.   . Codeine Nausea And Vomiting     Family History  Problem Relation Age of Onset  . Cancer Father        Lymphoma, Leukemia  . Depression Father   . Cancer Maternal Grandfather   . Cancer Paternal Grandmother   . Diabetes Paternal Grandfather   . Kidney disease Paternal Grandfather      Social History   Socioeconomic History  . Marital status: Married    Spouse name: Not on file  . Number of children: Not on file  . Years of education: Not on file  . Highest education level: Not on file  Occupational History  . Not on file  Social Needs   . Financial resource strain: Not on file  . Food insecurity:    Worry: Not on file    Inability: Not on file  . Transportation needs:    Medical: Not on file    Non-medical: Not on file  Tobacco Use  . Smoking status: Never Smoker  . Smokeless tobacco: Never Used  Substance and Sexual Activity  . Alcohol use: No  . Drug use: No  . Sexual activity: Yes  Lifestyle  . Physical activity:    Days per week: Not on file    Minutes per session: Not on file  . Stress: Not on file  Relationships  .  Social connections:    Talks on phone: Not on file    Gets together: Not on file    Attends religious service: Not on file    Active member of club or organization: Not on file    Attends meetings of clubs or organizations: Not on file    Relationship status: Not on file  . Intimate partner violence:    Fear of current or ex partner: Not on file    Emotionally abused: Not on file    Physically abused: Not on file    Forced sexual activity: Not on file  Other Topics Concern  . Not on file  Social History Narrative   Married.    Lives with husband and youngest son, who is 65 y/o.   Has 4 children.   Does not work outside of home.    No formal exercise. Only activity around the house.   Tries to be careful with Diabetic diet.     Review of Systems  Constitutional: Negative.   HENT: Negative.   Eyes: Negative.   Respiratory: Negative.   Cardiovascular: Negative.   Gastrointestinal: Negative.   Endocrine: Negative.   Genitourinary: Negative.   Musculoskeletal: Negative.   Skin: Negative.   Allergic/Immunologic: Negative.   Neurological: Negative.   Hematological: Negative.   Psychiatric/Behavioral: Negative.   All other systems reviewed and are negative.      Objective:    Vitals:   09/12/18 1137  BP: 118/70  Pulse: 61  Resp: 18  Temp: (!) 97.4 F (36.3 C)  SpO2: 96%  Weight: 252 lb 12.8 oz (114.7 kg)  Height: '5\' 2"'$  (1.575 m)      Physical Exam Vitals signs  and nursing note reviewed.  Constitutional:      Appearance: She is well-developed.  HENT:     Head: Normocephalic and atraumatic.     Nose: Nose normal.  Eyes:     General:        Right eye: No discharge.        Left eye: No discharge.     Conjunctiva/sclera: Conjunctivae normal.  Neck:     Trachea: No tracheal deviation.  Cardiovascular:     Rate and Rhythm: Normal rate and regular rhythm.  Pulmonary:     Effort: Pulmonary effort is normal. No respiratory distress.     Breath sounds: No stridor.  Chest:     Chest wall: Tenderness present. No mass, lacerations, deformity, swelling or crepitus. There is no dullness to percussion.  Abdominal:     General: Bowel sounds are normal.     Palpations: Abdomen is soft. There is no hepatomegaly, splenomegaly or pulsatile mass.     Tenderness: There is right CVA tenderness. There is no left CVA tenderness, guarding or rebound.     Comments: Obese soft abd  Musculoskeletal: Normal range of motion.       Back:     Comments: Area circled is ttp with palpation and with assessing for CVA tenderness on right.  Skin:    General: Skin is warm and dry.     Findings: No rash.  Neurological:     Mental Status: She is alert.     Motor: No abnormal muscle tone.     Coordination: Coordination normal.  Psychiatric:        Behavior: Behavior normal.     Results for orders placed or performed in visit on 09/12/18  Urinalysis, Routine w reflex microscopic  Result Value Ref Range   Color,  Urine YELLOW YELLOW   APPearance CLEAR CLEAR   Specific Gravity, Urine 1.029 1.001 - 1.03   pH 5.5 5.0 - 8.0   Glucose, UA NEGATIVE NEGATIVE   Bilirubin Urine NEGATIVE NEGATIVE   Ketones, ur TRACE (A) NEGATIVE   Hgb urine dipstick NEGATIVE NEGATIVE   Protein, ur NEGATIVE NEGATIVE   Nitrite NEGATIVE NEGATIVE   Leukocytes,Ua NEGATIVE NEGATIVE         Assessment & Plan:      ICD-10-CM   1. Right flank pain R10.9 Urinalysis, Routine w reflex  microscopic    Urine Culture   Pain to right posterior thoracic back/ribs and right flank area, ttp with light touch and CVA tenderness, she states is was much more achy after I tapped over right flank.   Urine dip unremarkable, culture added to r/o pyelo, but she has no urinary sx so I doubt.  On exam since its reproducible with very light touch over most of the inferior posterior ribs I have discussed with her that it may be muscle skeletal issue such as strain, inflammation or costochondritis. She states she has had the pain there for little while and her CT scan of abdomen pelvis was done 08/29/2018 and there was no nephrolithiasis or hydronephrosis, so it would be very unusual for her to have that now.  I did print for her and reviewed page by page her MRI result and her CT results.  I also went through her past x-rays which we did when she was having abdominal pain nausea and vomiting only when eating, we did a KUB and chest x-ray which in January of this year showed a shadow in the right upper quadrant we followed that up with a right renal ultrasound per radiologist recommendation and that was normal without any further work-up indicated by radiology.    Patient was reassured that imaging does not show any masses or lesions and at this point I do not see any other imaging that needs to be done.  That they (meaning Ortho) saw a shadow in the right upper quadrant just as we did earlier this year, and they must not of been able to see our records and know that we already worked it up, so they did the same thing they added on imaging to rule out any kind of mass.  I have asked the patient to request records from Ortho so that we may see them.  I have encouraged her to return to Ortho for back pain or muscle skeletal pain and encouraged NSAIDs heating pad and resting.  I have also told her I will try and reach out to the radiologist at [facility] imaging to see if they can review all of the images in  their entirety to reassure that there is nothing abnormal that warrants further work-up or evaluation.  Is very very anxious stating she has been "freaking out that she has cancer" for the past 2 weeks have reassured her that there is no signs of any cancers or masses or lesions and I believe she should try and relax and stop wearing at this point.  I will call her needed this week to follow-up with the urine culture results and with any feedback from the radiology group.   Delsa Grana, PA-C 09/12/18 11:57 AM

## 2018-09-13 LAB — URINE CULTURE
MICRO NUMBER:: 551171
Result:: NO GROWTH
SPECIMEN QUALITY:: ADEQUATE

## 2018-10-11 ENCOUNTER — Other Ambulatory Visit: Payer: Self-pay

## 2018-10-11 ENCOUNTER — Ambulatory Visit: Payer: 59 | Admitting: Family Medicine

## 2018-10-11 ENCOUNTER — Telehealth: Payer: Self-pay

## 2018-10-11 ENCOUNTER — Ambulatory Visit (INDEPENDENT_AMBULATORY_CARE_PROVIDER_SITE_OTHER): Payer: 59

## 2018-10-11 ENCOUNTER — Encounter: Payer: Self-pay | Admitting: Family Medicine

## 2018-10-11 VITALS — BP 142/80 | HR 62 | Temp 97.6°F | Resp 16 | Ht 62.0 in | Wt 256.4 lb

## 2018-10-11 DIAGNOSIS — E785 Hyperlipidemia, unspecified: Secondary | ICD-10-CM

## 2018-10-11 DIAGNOSIS — Z6841 Body Mass Index (BMI) 40.0 and over, adult: Secondary | ICD-10-CM | POA: Diagnosis not present

## 2018-10-11 DIAGNOSIS — I10 Essential (primary) hypertension: Secondary | ICD-10-CM

## 2018-10-11 DIAGNOSIS — E1165 Type 2 diabetes mellitus with hyperglycemia: Secondary | ICD-10-CM

## 2018-10-11 LAB — POCT GLYCOSYLATED HEMOGLOBIN (HGB A1C): Hemoglobin A1C: 6.8 % — AB (ref 4.0–5.6)

## 2018-10-11 MED ORDER — LISINOPRIL 20 MG PO TABS: 20.0000 mg | ORAL_TABLET | Freq: Every day | ORAL | 0 refills | Status: DC

## 2018-10-11 MED ORDER — LISINOPRIL-HYDROCHLOROTHIAZIDE 20-12.5 MG PO TABS: 1.0000 | ORAL_TABLET | Freq: Every day | ORAL | 3 refills | Status: DC

## 2018-10-11 NOTE — Telephone Encounter (Signed)
Error

## 2018-10-11 NOTE — Progress Notes (Addendum)
Patient ID: TKAI LARGE, female    DOB: 03/05/1961, 58 y.o.   MRN: [id number]  PCP: Delsa Grana, PA-C  Chief Complaint  Patient presents with  . Diabetes    4 mnth f/u    Subjective:   Alicia Pope is a 58 y.o. female, presents to clinic with CC of HLD and HTN follow up goes to endocrinology later today for follow up.  HLD -patient states she is compliant with her statin medication she takes every morning, no SE. she denies myalgias or fatigue.  She is not currently working on any particular exercise program secondary to muscle skeletal pain.  She has been dieting she states cutting out simple carbs like breads and potatoes and states that she is only been eating 1000 cal a day but continues to gain weight.  HTN lisinopril recalled?  She endorses today that she is very stressed out so her blood pressure has been higher but she has been taking her lisinopril medication, last visit checking her blood pressure, BP was soft and we decreased her medication from lisinopril-HCTZ 20-12.5 to lisinopril 10 mg, was to monitor and take 10-20 mg lisinopril and f/up.  She has noticed a little swelling in her feet since those med changes.  No CP, SOB, palpitations, orthopnea, PND.   DM- per endocrinology, taking 150 units insulin now, was on Victoza and she was losing weight and did like that medication but her insurance stopped covering it and she could not afford it.  She is very concerned that she has been gaining weight since stopping Victoza.  She states that the more she increases her insulin and good control of her diabetes the more she seems to weigh.    Patient Active Problem List   Diagnosis Date Noted  . Diabetes ([place]) 04/29/2016  . Insomnia 04/02/2015  . Obesity 09/23/2014  . Vitamin D deficiency 09/23/2014  . Screening for colorectal cancer 09/23/2014  . Anxiety   . Depression   . GERD (gastroesophageal reflux disease)   . Hypertension   . Hyperlipidemia     Current Meds   Medication Sig  . aspirin 81 MG tablet Take 1 tablet (81 mg total) by mouth daily.  . blood glucose meter kit and supplies KIT Inject 1 each into the skin as directed. Dispense based on patient and insurance preference. Check blood sugar fasting each morning.  ICD E11.65  . Blood Glucose Monitoring Suppl (FREESTYLE LITE) DEVI UAD.E11.65  . CVS D3 2000 units CAPS Take 2 capsules (4,000 Units total) by mouth daily.  Marland Kitchen glucose blood test strip Dispense based on patient and insurance preference.  Check blood sugar fasting each morning.  ICD E11.65  . Insulin Glargine (BASAGLAR KWIKPEN) 100 UNIT/ML SOPN Inject 1.3 mLs (130 Units total) into the skin every morning.  . Lancets MISC Dispense based on patient and insurance preference.  Check blood sugar fasting each morning.  ICD E11.65  . lisinopril (ZESTRIL) 10 MG tablet TAKE 2 TABLETS BY MOUTH EVERY DAY  . LORazepam (ATIVAN) 1 MG tablet TAKE 1/2 TO 1TAB BY MOUTH 2 X DAILY AS NEEDED FOR ANXIETY (SPARINGLY NEED TO TAPER SO NOT DAILY USE)  . metFORMIN (GLUCOPHAGE-XR) 500 MG 24 hr tablet Take 1 tablet (500 mg total) by mouth daily with breakfast.  . mirtazapine (REMERON) 30 MG tablet TAKE 1 TABLET BY MOUTH EVERYDAY AT BEDTIME  . pantoprazole (PROTONIX) 40 MG tablet Take 1 tablet (40 mg total) by mouth daily.  . rosuvastatin (CRESTOR)  40 MG tablet TAKE 1 TABLET BY MOUTH EVERY DAY  . tizanidine (ZANAFLEX) 2 MG capsule Take 1 capsule (2 mg total) by mouth 3 (three) times daily as needed for muscle spasms.     Review of Systems  Constitutional: Negative.   HENT: Negative.   Eyes: Negative.   Respiratory: Negative.   Cardiovascular: Negative.   Gastrointestinal: Negative.   Endocrine: Negative.   Genitourinary: Negative.   Musculoskeletal: Negative.   Skin: Negative.   Allergic/Immunologic: Negative.   Neurological: Negative.   Hematological: Negative.   Psychiatric/Behavioral: Negative.   All other systems reviewed and are negative.       Objective:    Vitals:   10/11/18 1021  BP: (!) 142/80  Pulse: 62  Resp: 16  Temp: 97.6 F (36.4 C)  SpO2: 97%  Weight: 256 lb 6.4 oz (116.3 kg)  Height: '5\' 2"'$  (1.575 m)      Physical Exam Vitals signs and nursing note reviewed.  Constitutional:      General: She is not in acute distress.    Appearance: She is well-developed. She is obese. She is not ill-appearing, toxic-appearing or diaphoretic.  HENT:     Head: Normocephalic and atraumatic.     Right Ear: External ear normal.     Left Ear: External ear normal.     Nose: Nose normal.  Eyes:     General:        Right eye: No discharge.        Left eye: No discharge.     Conjunctiva/sclera: Conjunctivae normal.  Neck:     Trachea: No tracheal deviation.  Cardiovascular:     Rate and Rhythm: Normal rate and regular rhythm.     Pulses: Normal pulses.     Heart sounds: Normal heart sounds. No murmur. No friction rub. No gallop.   Pulmonary:     Effort: Pulmonary effort is normal. No respiratory distress.     Breath sounds: Normal breath sounds. No stridor. No wheezing, rhonchi or rales.  Musculoskeletal:     Right lower leg: No edema.     Left lower leg: No edema.     Comments: Mild b/l pedal edema, non-pitting, no pretibial edema  Skin:    General: Skin is warm and dry.     Findings: No rash.  Neurological:     Mental Status: She is alert.     Motor: No abnormal muscle tone.     Coordination: Coordination normal.  Psychiatric:        Mood and Affect: Mood is anxious.        Behavior: Behavior normal.           Assessment & Plan:   58 y/o female here for routine f/up on multiple chronic conditions  Problem List Items Addressed This Visit      Cardiovascular and Mediastinum   Hypertension - Primary    Unclear info about med compliance.  Will follow up with pt pharmacy for further info. If lisinopril available, will likely restart lisinopril- HCTZ 10-12.5.  It was decreased several months ago due to soft  BP at the time. She has some swelling, expect it will improve with starting HCTZ again. BP elevated today, no concerning sx, likely some related to her anxiety and stress, slightly worked up in exam room today. Monitor at home, f/up if >160/100      Relevant Orders   COMPLETE METABOLIC PANEL WITH GFR   Lipid panel     Other  Hyperlipidemia    Compliant with meds, no SE Recheck fasting labs Continue statin, 4 month f/up      Relevant Orders   COMPLETE METABOLIC PANEL WITH GFR   Lipid panel   Obesity    Encouraged calorie deficit and increased physical activity as able. May be able to offer samples of GLP-1 if unable to afford (pt to talk to endrocrinologist about DM meds first)      Relevant Orders   COMPLETE METABOLIC PANEL WITH GFR   Lipid panel       Handicap placard expires august 8th, she requests a new application.  Cannot walk long distances due to MSK/ortho pain - 6 month    Delsa Grana, PA-C 10/11/18 10:55 AM

## 2018-10-11 NOTE — Progress Notes (Signed)
Pt presents today for nurse visit for completion of A1C. 

## 2018-10-11 NOTE — Patient Instructions (Signed)
3 month follow up  Continue to work on calorie reduction and exercise in any form that you can  I will call you with the lab results and following up with your pharmacy about your blood pressure medicine.

## 2018-10-11 NOTE — Addendum Note (Signed)
Addended by: Delsa Grana on: 10/11/2018 06:27 PM   Modules accepted: Orders

## 2018-10-11 NOTE — Assessment & Plan Note (Addendum)
Unclear info about med compliance.  Will follow up with pt pharmacy for further info. If lisinopril available, will likely restart lisinopril- HCTZ 10-12.5.  It was decreased several months ago due to soft BP at the time. She has some swelling, expect it will improve with starting HCTZ again. BP elevated today, no concerning sx, likely some related to her anxiety and stress, slightly worked up in exam room today. Monitor at home, f/up if >160/100

## 2018-10-11 NOTE — Assessment & Plan Note (Signed)
Compliant with meds, no SE Recheck fasting labs Continue statin, 4 month f/up

## 2018-10-11 NOTE — Assessment & Plan Note (Signed)
Encouraged calorie deficit and increased physical activity as able. May be able to offer samples of GLP-1 if unable to afford (pt to talk to endrocrinologist about DM meds first)

## 2018-10-12 LAB — COMPLETE METABOLIC PANEL WITH GFR
AG Ratio: 1.7 (calc) (ref 1.0–2.5)
ALT: 18 U/L (ref 6–29)
AST: 16 U/L (ref 10–35)
Albumin: 4.2 g/dL (ref 3.6–5.1)
Alkaline phosphatase (APISO): 51 U/L (ref 37–153)
BUN: 11 mg/dL (ref 7–25)
CO2: 23 mmol/L (ref 20–32)
Calcium: 9.3 mg/dL (ref 8.6–10.4)
Chloride: 110 mmol/L (ref 98–110)
Creat: 0.68 mg/dL (ref 0.50–1.05)
GFR, Est African American: 112 mL/min/{1.73_m2} (ref >=60)
GFR, Est Non African American: 96 mL/min/{1.73_m2} (ref >=60)
Globulin: 2.5 g/dL (calc) (ref 1.9–3.7)
Glucose, Bld: 85 mg/dL (ref 65–99)
Potassium: 4.2 mmol/L (ref 3.5–5.3)
Sodium: 142 mmol/L (ref 135–146)
Total Bilirubin: 0.5 mg/dL (ref 0.2–1.2)
Total Protein: 6.7 g/dL (ref 6.1–8.1)

## 2018-10-12 LAB — LIPID PANEL
Cholesterol: 167 mg/dL (ref <200)
HDL: 58 mg/dL (ref >=50)
LDL Cholesterol (Calc): 84 mg/dL (calc)
Non-HDL Cholesterol (Calc): 109 mg/dL (calc) (ref <130)
Total CHOL/HDL Ratio: 2.9 (calc) (ref <5.0)
Triglycerides: 151 mg/dL — ABNORMAL HIGH (ref <150)

## 2018-10-15 ENCOUNTER — Other Ambulatory Visit: Payer: Self-pay | Admitting: Family Medicine

## 2018-11-06 ENCOUNTER — Other Ambulatory Visit: Payer: Self-pay | Admitting: Family Medicine

## 2018-11-08 ENCOUNTER — Other Ambulatory Visit: Payer: Self-pay | Admitting: Internal Medicine

## 2018-11-08 ENCOUNTER — Telehealth: Payer: Self-pay

## 2018-11-08 ENCOUNTER — Other Ambulatory Visit: Payer: Self-pay | Admitting: Endocrinology

## 2018-11-08 MED ORDER — TRESIBA FLEXTOUCH 100 UNIT/ML ~~LOC~~ SOPN: 100.0000 [IU] | PEN_INJECTOR | Freq: Every day | SUBCUTANEOUS | 6 refills | Status: DC

## 2018-11-08 NOTE — Telephone Encounter (Signed)
-----   Message from Cloyd Stagers, MD sent at 11/08/2018  2:54 PM EDT ----- Please let her know basaglar is not covered, this was changed to tresiba.    Please note reduce in dosing.    Tyler Aas 100 units daily    Abby Nena Jordan, MD  [facility]  [facility] [address] [place], [place] [address] Phone: [phone] FAX: [phone]

## 2018-11-08 NOTE — Telephone Encounter (Signed)
Called pt and informed of Dr. Shamleffer's orders. Verbalized acceptance and understanding. 

## 2018-11-09 ENCOUNTER — Other Ambulatory Visit: Payer: Self-pay

## 2018-11-09 ENCOUNTER — Encounter (HOSPITAL_COMMUNITY): Payer: Self-pay | Admitting: Emergency Medicine

## 2018-11-09 ENCOUNTER — Emergency Department (HOSPITAL_COMMUNITY)
Admission: EM | Admit: 2018-11-09 | Discharge: 2018-11-09 | Disposition: A | Discharge status: Home / Self Care | Payer: 59 | Attending: Emergency Medicine | Admitting: Emergency Medicine

## 2018-11-09 DIAGNOSIS — K0889 Other specified disorders of teeth and supporting structures: Secondary | ICD-10-CM | POA: Diagnosis not present

## 2018-11-09 DIAGNOSIS — Z79899 Other long term (current) drug therapy: Secondary | ICD-10-CM | POA: Insufficient documentation

## 2018-11-09 DIAGNOSIS — E119 Type 2 diabetes mellitus without complications: Secondary | ICD-10-CM | POA: Insufficient documentation

## 2018-11-09 DIAGNOSIS — I10 Essential (primary) hypertension: Secondary | ICD-10-CM | POA: Diagnosis not present

## 2018-11-09 DIAGNOSIS — Z7982 Long term (current) use of aspirin: Secondary | ICD-10-CM | POA: Diagnosis not present

## 2018-11-09 DIAGNOSIS — Z794 Long term (current) use of insulin: Secondary | ICD-10-CM | POA: Diagnosis not present

## 2018-11-09 LAB — CBG MONITORING, ED: Glucose-Capillary: 116 mg/dL — ABNORMAL HIGH (ref 70–99)

## 2018-11-09 MED ORDER — CHLORHEXIDINE GLUCONATE 0.12% ORAL RINSE (MEDLINE KIT): 15.0000 mL | Freq: Every day | OROMUCOSAL | 0 refills | Status: DC

## 2018-11-09 MED ORDER — BUPIVACAINE-EPINEPHRINE (PF) 0.5% -1:200000 IJ SOLN
10.0000 mL | Freq: Once | INTRAMUSCULAR | Status: AC
  Administered 2018-11-09: 10 mL

## 2018-11-09 MED ORDER — FENTANYL CITRATE (PF) 100 MCG/2ML IJ SOLN
12.5000 ug | Freq: Once | INTRAMUSCULAR | Status: AC
  Administered 2018-11-09: 12.5 ug via INTRAMUSCULAR
  Filled 2018-11-09: qty 2

## 2018-11-09 MED ORDER — AMOXICILLIN-POT CLAVULANATE 875-125 MG PO TABS: 1.0000 | ORAL_TABLET | Freq: Two times a day (BID) | ORAL | 0 refills | Status: AC

## 2018-11-09 MED ORDER — ONDANSETRON 4 MG PO TBDP
4.0000 mg | ORAL_TABLET | Freq: Once | ORAL | Status: AC
  Administered 2018-11-09: 4 mg via ORAL
  Filled 2018-11-09: qty 1

## 2018-11-09 NOTE — ED Triage Notes (Signed)
Pt c/o left sided dental pain that radiates to her chin x 3 days. Has been unable to see her dentist.

## 2018-11-09 NOTE — ED Notes (Signed)
Pt requested she wanted her blood sugar checked due to not eating in 3 days. Pt's blood glucose was 116 mg/dL.

## 2018-11-09 NOTE — ED Provider Notes (Signed)
[facility] DEPARTMENT Provider Note   CSN: [id number] Arrival date & time: 11/09/18  1801    History   Chief Complaint Chief Complaint  Patient presents with  . Dental Pain    HPI Alicia Pope is a 58 y.o. female.     The history is provided by the patient and medical records. No language interpreter was used.  Dental Pain Location:  Generalized (left upper and lower jaw) Quality:  Constant and throbbing Severity:  Severe Onset quality:  Gradual Duration:  3 days Timing:  Constant Progression:  Worsening Chronicity:  Recurrent Context: crown fracture, dental caries and dental fracture   Context: not abscess   Relieved by:  Nothing Worsened by:  Touching Ineffective treatments:  Acetaminophen and topical anesthetic gel Associated symptoms: no congestion, no difficulty swallowing, no drooling, no facial pain, no facial swelling, no fever, no gum swelling, no headaches, no neck pain, no neck swelling, no oral bleeding, no oral lesions and no trismus   Risk factors: diabetes and lack of dental care     Past Medical History:  Diagnosis Date  . Allergy   . Anxiety 2013  . Asthma 2015  . Asthma   . Depression 2013  . Diabetes mellitus 2004  . GERD (gastroesophageal reflux disease) 2013  . Hyperlipidemia 1992  . Hypertension 1992  . Memory impairment     Patient Active Problem List   Diagnosis Date Noted  . Diabetes ([place]) 04/29/2016  . Insomnia 04/02/2015  . Obesity 09/23/2014  . Vitamin D deficiency 09/23/2014  . Screening for colorectal cancer 09/23/2014  . Anxiety   . Depression   . GERD (gastroesophageal reflux disease)   . Hypertension   . Hyperlipidemia     Past Surgical History:  Procedure Laterality Date  . ANTERIOR CRUCIATE LIGAMENT REPAIR  1997  . APPENDECTOMY  1987  . CESAREAN SECTION     [phone]  . CHOLECYSTECTOMY  1987  . uterine ablation       OB History   No obstetric history on file.       Home Medications    Prior to Admission medications   Medication Sig Start Date End Date Taking? Authorizing Provider  amoxicillin-clavulanate (AUGMENTIN) 875-125 MG tablet Take 1 tablet by mouth every 12 (twelve) hours for 7 days. 11/09/18 11/16/18  Maliq Pilley E, PA-C  aspirin 81 MG tablet Take 1 tablet (81 mg total) by mouth daily. 11/05/13   Dena Billet B, PA-C  blood glucose meter kit and supplies KIT Inject 1 each into the skin as directed. Dispense based on patient and insurance preference. Check blood sugar fasting each morning.  ICD E11.65 09/24/14   Orlena Sheldon, PA-C  Blood Glucose Monitoring Suppl (FREESTYLE LITE) DEVI UAD.E11.65 09/24/14   [provider]  chlorhexidine gluconate, MEDLINE KIT, (PERIDEX) 0.12 % solution Use as directed 15 mLs in the mouth or throat daily. 11/09/18   Ranon Coven E, PA-C  [facility] D3 2000 units CAPS Take 2 capsules (4,000 Units total) by mouth daily. 07/29/16   Dena Billet B, PA-C  glucose blood test strip Dispense based on patient and insurance preference.  Check blood sugar fasting each morning.  ICD E11.65 09/24/14   Dena Billet B, PA-C  insulin degludec (TRESIBA FLEXTOUCH) 100 UNIT/ML SOPN FlexTouch Pen Inject 1 mL (100 Units total) into the skin daily. 11/08/18   Renato Shin, MD  Lancets MISC Dispense based on patient and insurance preference.  Check blood sugar fasting each  morning.  ICD E11.65 09/24/14   Orlena Sheldon, PA-C  lisinopril-hydrochlorothiazide (ZESTORETIC) 20-12.5 MG tablet Take 1 tablet by mouth daily. 10/11/18   Delsa Grana, PA-C  LORazepam (ATIVAN) 1 MG tablet TAKE 1/2 TO 1TAB BY MOUTH 2 X DAILY AS NEEDED FOR ANXIETY (SPARINGLY NEED TO TAPER SO NOT DAILY USE) 09/04/18   Alycia Rossetti, MD  mirtazapine (REMERON) 30 MG tablet TAKE 1 TABLET BY MOUTH EVERYDAY AT BEDTIME 11/06/18   Susy Frizzle, MD  pantoprazole (PROTONIX) 40 MG tablet Take 1 tablet (40 mg total) by mouth daily. 04/19/18   Delsa Grana, PA-C  rosuvastatin (CRESTOR)  40 MG tablet TAKE 1 TABLET BY MOUTH EVERY DAY 10/16/18   Delsa Grana, PA-C  tizanidine (ZANAFLEX) 2 MG capsule Take 1 capsule (2 mg total) by mouth 3 (three) times daily as needed for muscle spasms. 06/08/18   Delsa Grana, PA-C    Family History Family History  Problem Relation Age of Onset  . Cancer Father        Lymphoma, Leukemia  . Depression Father   . Cancer Maternal Grandfather   . Cancer Paternal Grandmother   . Diabetes Paternal Grandfather   . Kidney disease Paternal Grandfather     Social History Social History   Tobacco Use  . Smoking status: Never Smoker  . Smokeless tobacco: Never Used  Substance Use Topics  . Alcohol use: No  . Drug use: No     Allergies   Contrast media [iodinated diagnostic agents], Ambien [zolpidem tartrate], and Codeine   Review of Systems Review of Systems  Constitutional: Negative for fever.  HENT: Negative for congestion, drooling, facial swelling and mouth sores.   Musculoskeletal: Negative for neck pain.  Neurological: Negative for headaches.     Physical Exam Updated Vital Signs BP (!) 164/81 (BP Location: Right Arm)   Pulse 82   Temp 98.2 F (36.8 C) (Oral)   Resp (!) 24   SpO2 99%   Physical Exam Vitals signs and nursing note reviewed.  Constitutional:      Appearance: She is well-developed. She is not ill-appearing or toxic-appearing.     Comments: Pt appears anxious. She has increased respiratory rate at rest, in conversation she has normal work of breathing  HENT:     Head: Normocephalic.     Nose: Nose normal.     Mouth/Throat:      Comments: Pt with multiple caries and fractures.No noted area of swelling or fluctuance. No trismus. Mouth opening to at least 3 finger widths. Handles oral secretions without difficulty. External exam shows no asymmetry of the jaw line or face, no signs of obvious swelling or infection. No swelling or tenderness to the submental or submandibular regions. No swelling or  tenderness into the soft tissues of the neck.Full active range of motion of the jaw. Neck is supple with full active range of motion, no tenderness to palpation of the soft tissues.   Posterior oropharynx clear with no signs of infection, uvula is midline and rises with phonation.   Eyes:     General: No scleral icterus.       Right eye: No discharge.        Left eye: No discharge.     Conjunctiva/sclera: Conjunctivae normal.  Neck:     Musculoskeletal: Normal range of motion.     Vascular: No JVD.  Cardiovascular:     Rate and Rhythm: Normal rate and regular rhythm.     Pulses: Normal pulses.  Heart sounds: Normal heart sounds.  Pulmonary:     Effort: Pulmonary effort is normal.     Breath sounds: Normal breath sounds.  Abdominal:     General: There is no distension.  Musculoskeletal: Normal range of motion.  Skin:    General: Skin is warm and dry.  Neurological:     Mental Status: She is oriented to person, place, and time.     GCS: GCS eye subscore is 4. GCS verbal subscore is 5. GCS motor subscore is 6.     Comments: Fluent speech, no facial droop.  Psychiatric:        Behavior: Behavior normal.      ED Treatments / Results  Labs (all labs ordered are listed, but only abnormal results are displayed) Labs Reviewed  CBG MONITORING, ED - Abnormal; Notable for the following components:      Result Value   Glucose-Capillary 116 (*)    All other components within normal limits    EKG None  Radiology No results found.  Procedures Dental block was performed with 0.5% bupivacaine with epi and an alveolar block was performed. Injections made at base of tooth as well as the tooth immediately posterior and anterior to tooth. Anesthesia was only minimally obtained. Minimal bleeding after injections. Patient tolerated procedure well with no immediate complications.   Medications Ordered in ED Medications  bupivacaine-epinephrine (MARCAINE W/ EPI) 0.5% -1:200000  injection 10 mL (10 mLs Infiltration Given 11/09/18 2128)  fentaNYL (SUBLIMAZE) injection 12.5 mcg (12.5 mcg Intramuscular Given 11/09/18 2319)  ondansetron (ZOFRAN-ODT) disintegrating tablet 4 mg (4 mg Oral Given 11/09/18 2319)     Initial Impression / Assessment and Plan / ED Course  I have reviewed the triage vital signs and the nursing notes.  Pertinent labs & imaging results that were available during my care of the patient were reviewed by me and considered in my medical decision making (see chart for details).  Pt is well appearing.  Chronic dental decay. Patient with toothache.  No gross abscess. I do not appreciate facial swelling on exam.  Exam also unconcerning for Ludwig's angina or spread of infection.  Will treat with Augmentin and anti-inflammatories medicine.  Dental block performed with minimal improvement in pain, will give dose of pain medicine in ED. Pt reports severe allergy to codeine with reaction of nausea and vomiting. I discussed that reaction is likely side effect of narcotics, pt is insistent she cannot take morphine, percocet, oxycodone. Chart review does not not show pt has taken these medications so will avoid today. PDMP reviewed during this encounter. and pt has no narcotic prescriptions, only monthly ativan prescriptions.   Pt appears stable for d/c.   Urged patient to follow-up with dentist.  Pt has documented blood pressure of 209/66 at discharge. Pt was unable to sit still while having BP taken. Given elevated reading I planned to observe pt in ED for short time and recheck. Pt is requesting to leave immediately because she has to get home to her daughter. I recommend pt also see pcp for blood pressure recheck within 1 week. Strict ED return precautions discussed. Pt agrees to plan and has no further questions.  This note was prepared using Dragon voice recognition software and may include unintentional dictation errors due to the inherent limitations of voice recognition  software.      Final Clinical Impressions(s) / ED Diagnoses   Final diagnoses:  Pain, dental    ED Discharge Orders  Ordered    amoxicillin-clavulanate (AUGMENTIN) 875-125 MG tablet  Every 12 hours     11/09/18 2128    chlorhexidine gluconate, MEDLINE KIT, (PERIDEX) 0.12 % solution  Daily     11/09/18 2327           Flint Melter 11/10/18 1043    Davonna Belling, MD 11/11/18 [phone]

## 2018-11-09 NOTE — Discharge Instructions (Addendum)
Take antibiotics as directed. Please take all of your antibiotics until finished.  Swish and spit 15 mL of chlorhexidine mouthwash for 30 seconds times daily.  You can also gargle warm salt water up to 5 times daily.  Both of these rinses can help to remove bacteria from the mouth.  You can also use viscous lidocaine every 3 hours to help numb the area.  You can apply a cool compress for 15 to 20 minutes, but avoid applying heat to the area.  Apply warm compresses to jaw throughout the day. You can take Tylenol or Ibuprofen as directed for pain. You can alternate Tylenol and Ibuprofen every 4 hours. If you take Tylenol at 1pm, then you can take Ibuprofen at 5pm. Then you can take Tylenol again at 9pm.   The exam and treatment you received today has been provided on an emergency basis only. This is not a substitute for complete medical or dental care. If your problem worsens or new symptoms (problems) appear, and you are unable to arrange prompt follow-up care with your dentist, call or return to this location. If you do not have a dentist, please follow-up with one on the list provided  CALL YOUR DENTIST OR RETURN IMMEDIATELY IF you develop a fever, rash, difficulty breathing or swallowing, neck or facial swelling, or other potentially serious concerns.

## 2018-11-09 NOTE — ED Notes (Signed)
Pt is saying her tooth is hurting and keeps asking why pain medication is not working. I have told her that pain meds. will not make her tooth stop  It is in the nerve and has a bad teeth. Pt was told by provider she needs to get a dentist.

## 2018-11-10 ENCOUNTER — Other Ambulatory Visit: Payer: Self-pay

## 2018-11-10 DIAGNOSIS — E1165 Type 2 diabetes mellitus with hyperglycemia: Secondary | ICD-10-CM

## 2018-11-10 MED ORDER — LANTUS SOLOSTAR 100 UNIT/ML ~~LOC~~ SOPN: 100.0000 [IU] | PEN_INJECTOR | Freq: Every day | SUBCUTANEOUS | 6 refills | Status: DC

## 2018-11-10 NOTE — ED Notes (Signed)
Wasted 87.5 of fentanyl  With BEN RN, IN SHARPS. Pt was discharged and got very busy. Only 1 nurse.

## 2018-11-10 NOTE — Telephone Encounter (Signed)
Received notification from [facility] stating Alicia Pope is not covered by pt plan and that either Toujeo or Lantus is preferred. This message routed to Dr. Kelton Pillar for her to review and address. Will await her response.

## 2018-11-10 NOTE — Telephone Encounter (Signed)
Insulin Glargine (LANTUS SOLOSTAR) 100 UNIT/ML Solostar Pen 30 mL 6 11/10/2018    Sig - Route: Inject 100 Units into the skin daily. - Subcutaneous   Sent to pharmacy as: Insulin Glargine (LANTUS SOLOSTAR) 100 UNIT/ML Solostar Pen   E-Prescribing Status: Receipt confirmed by pharmacy (11/10/2018 10:49 AM EDT)

## 2018-11-10 NOTE — ED Notes (Signed)
Wasted 87.5 of Fentanyl with Levada Dy, RN in sharps. Dt discharged and unable to complete in pyxis system.

## 2018-11-21 ENCOUNTER — Other Ambulatory Visit: Payer: Self-pay | Admitting: Family Medicine

## 2018-12-08 ENCOUNTER — Other Ambulatory Visit: Payer: Self-pay

## 2018-12-13 ENCOUNTER — Other Ambulatory Visit: Payer: Self-pay

## 2018-12-13 ENCOUNTER — Other Ambulatory Visit: Payer: Self-pay | Admitting: Endocrinology

## 2018-12-13 ENCOUNTER — Encounter: Payer: Self-pay | Admitting: Endocrinology

## 2018-12-13 ENCOUNTER — Ambulatory Visit (INDEPENDENT_AMBULATORY_CARE_PROVIDER_SITE_OTHER): Payer: 59 | Admitting: Endocrinology

## 2018-12-13 VITALS — BP 130/72 | HR 78 | Ht 62.0 in | Wt 245.8 lb

## 2018-12-13 DIAGNOSIS — Z794 Long term (current) use of insulin: Secondary | ICD-10-CM | POA: Diagnosis not present

## 2018-12-13 DIAGNOSIS — E1165 Type 2 diabetes mellitus with hyperglycemia: Secondary | ICD-10-CM

## 2018-12-13 DIAGNOSIS — E119 Type 2 diabetes mellitus without complications: Secondary | ICD-10-CM | POA: Diagnosis not present

## 2018-12-13 LAB — POCT GLYCOSYLATED HEMOGLOBIN (HGB A1C): Hemoglobin A1C: 6.9 % — AB (ref 4.0–5.6)

## 2018-12-13 MED ORDER — NOVOLOG FLEXPEN 100 UNIT/ML ~~LOC~~ SOPN: 10.0000 [IU] | PEN_INJECTOR | Freq: Three times a day (TID) | SUBCUTANEOUS | 11 refills | Status: DC

## 2018-12-13 MED ORDER — LANTUS SOLOSTAR 100 UNIT/ML ~~LOC~~ SOPN: 70.0000 [IU] | PEN_INJECTOR | Freq: Every day | SUBCUTANEOUS | 6 refills | Status: DC

## 2018-12-13 MED ORDER — INSULIN LISPRO (1 UNIT DIAL) 100 UNIT/ML (KWIKPEN): PEN_INJECTOR | SUBCUTANEOUS | 0 refills | Status: DC

## 2018-12-13 NOTE — Patient Instructions (Addendum)
check your blood sugar twice a day.  vary the time of day when you check, between before the 3 meals, and at bedtime.  also check if you have symptoms of your blood sugar being too high or too low.  please keep a record of the readings and bring it to your next appointment here (or you can bring the meter itself).  You can write it on any piece of paper.  please call us sooner if your blood sugar goes below 70, or if you have a lot of readings over 200. On this type of insulin schedule, you should eat meals on a regular schedule.  If a meal is missed or significantly delayed, your blood sugar could go low.  Please reduce the Lantus to 70 units daily, and:  I have sent a prescription to your pharmacy, to add Novolog, 10 units 3 times a day (just before each meal).   Please come back for a follow-up appointment in 2 months.

## 2018-12-13 NOTE — Progress Notes (Signed)
Subjective:    [name], female    DOB: 06/02/1960, 58 y.o.   MRN: [id number]  HPI Pt returns for f/u of diabetes mellitus: DM type: Insulin-requiring type 2.  Dx'ed: 6010 Complications: none Therapy: insulin since 2018 GDM: 1988 DKA: never Severe hypoglycemia: never Pancreatitis: never Pancreatic imaging: normal on 2013 CT Other: she did not tolerate Invokana (vaginitis); she stopped victoza, due to cost.   Interval history: Pt says cbg varies from 80-153.  She checks fasting only.  pt states she feels well in general.  Pt says she does not miss the insulin.   Past Medical History:  Diagnosis Date  . Allergy   . Anxiety 2013  . Asthma 2015  . Asthma   . Depression 2013  . Diabetes mellitus 2004  . GERD (gastroesophageal reflux disease) 2013  . Hyperlipidemia 1992  . Hypertension 1992  . Memory impairment     Past Surgical History:  Procedure Laterality Date  . ANTERIOR CRUCIATE LIGAMENT REPAIR  1997  . APPENDECTOMY  1987  . CESAREAN SECTION     [phone]  . CHOLECYSTECTOMY  1987  . uterine ablation      Social History   Socioeconomic History  . Marital status: Married    Spouse name: Not on file  . Number of children: Not on file  . Years of education: Not on file  . Highest education level: Not on file  Occupational History  . Not on file  Social Needs  . Financial resource strain: Not on file  . Food insecurity    Worry: Not on file    Inability: Not on file  . Transportation needs    Medical: Not on file    Non-medical: Not on file  Tobacco Use  . Smoking status: Never Smoker  . Smokeless tobacco: Never Used  Substance and Sexual Activity  . Alcohol use: No  . Drug use: No  . Sexual activity: Yes  Lifestyle  . Physical activity    Days per week: Not on file    Minutes per session: Not on file  . Stress: Not on file  Relationships  . Social Herbalist on phone: Not on file    Gets together: Not on file   Attends religious service: Not on file    Active member of club or organization: Not on file    Attends meetings of clubs or organizations: Not on file    Relationship status: Not on file  . Intimate partner violence    Fear of current or ex partner: Not on file    Emotionally abused: Not on file    Physically abused: Not on file    Forced sexual activity: Not on file  Other Topics Concern  . Not on file  Social History Narrative   Married.    Lives with husband and youngest son, who is 41 y/o.   Has 4 children.   Does not work outside of home.    No formal exercise. Only activity around the house.   Tries to be careful with Diabetic diet.    Current Outpatient Medications on File Prior to Visit  Medication Sig Dispense Refill  . aspirin 81 MG tablet Take 1 tablet (81 mg total) by mouth daily. 30 tablet 11  . blood glucose meter kit and supplies KIT Inject 1 each into the skin as directed. Dispense based on patient and insurance preference. Check blood sugar fasting each morning.  ICD  E11.65 1 each 0  . Blood Glucose Monitoring Suppl (FREESTYLE LITE) DEVI UAD.E11.65  0  . chlorhexidine gluconate, MEDLINE KIT, (PERIDEX) 0.12 % solution Use as directed 15 mLs in the mouth or throat daily. 120 mL 0  . [facility] D3 2000 units CAPS Take 2 capsules (4,000 Units total) by mouth daily. 30 each 3  . glucose blood test strip Dispense based on patient and insurance preference.  Check blood sugar fasting each morning.  ICD E11.65 50 each 12  . Lancets MISC Dispense based on patient and insurance preference.  Check blood sugar fasting each morning.  ICD E11.65 100 each 3  . lisinopril-hydrochlorothiazide (ZESTORETIC) 20-12.5 MG tablet Take 1 tablet by mouth daily. 90 tablet 3  . LORazepam (ATIVAN) 1 MG tablet TAKE 1/2 TO 1TAB BY MOUTH 2 X DAILY AS NEEDED FOR ANXIETY (SPARINGLY NEED TO TAPER SO NOT DAILY USE) 55 tablet 0  . mirtazapine (REMERON) 30 MG tablet TAKE 1 TABLET BY MOUTH EVERYDAY AT BEDTIME 90  tablet 0  . pantoprazole (PROTONIX) 40 MG tablet Take 1 tablet (40 mg total) by mouth daily. 30 tablet 3  . rosuvastatin (CRESTOR) 40 MG tablet TAKE 1 TABLET BY MOUTH EVERY DAY 90 tablet 3  . tizanidine (ZANAFLEX) 2 MG capsule Take 1 capsule (2 mg total) by mouth 3 (three) times daily as needed for muscle spasms. 90 capsule 1   No current facility-administered medications on file prior to visit.     Allergies  Allergen Reactions  . Contrast Media [Iodinated Diagnostic Agents] Anaphylaxis  . Ambien [Zolpidem Tartrate] Other (See Comments)    Family has told her that after taking Ambien, she does "crazy things" that she does not remember doing.   . Codeine Nausea And Vomiting    Family History  Problem Relation Age of Onset  . Cancer Father        Lymphoma, Leukemia  . Depression Father   . Cancer Maternal Grandfather   . Cancer Paternal Grandmother   . Diabetes Paternal Grandfather   . Kidney disease Paternal Grandfather     BP 130/72 (BP Location: Left Arm, Patient Position: Sitting, Cuff Size: Large)   Pulse 78   Ht '5\' 2"'$  (1.575 m)   Wt 245 lb 12.8 oz (111.5 kg)   SpO2 97%   BMI 44.96 kg/m    Review of Systems She denies hypoglycemia    Objective:   Physical Exam VITAL SIGNS:  See vs page GENERAL: no distress Pulses: dorsalis pedis intact bilat.   MSK: no deformity of the feet CV: no leg edema Skin:  no ulcer on the feet.  normal color and temp on the feet. Neuro: sensation is intact to touch on the feet  Lab Results  Component Value Date   HGBA1C 6.9 (A) 12/13/2018        Assessment & Plan:  Insulin-requiring type 2 DM: overcontrolled, given this regimen, which does match insulin to her changing needs throughout the day.  We discussed the need for multiple daily injections.  She agrees.    Patient Instructions  check your blood sugar twice a day.  vary the time of day when you check, between before the 3 meals, and at bedtime.  also check if you have  symptoms of your blood sugar being too high or too low.  please keep a record of the readings and bring it to your next appointment here (or you can bring the meter itself).  You can write it on any piece  of paper.  please call us sooner if your blood sugar goes below 70, or if you have a lot of readings over 200. On this type of insulin schedule, you should eat meals on a regular schedule.  If a meal is missed or significantly delayed, your blood sugar could go low.  Please reduce the Lantus to 70 units daily, and:  I have sent a prescription to your pharmacy, to add Novolog, 10 units 3 times a day (just before each meal).   Please come back for a follow-up appointment in 2 months.

## 2018-12-15 ENCOUNTER — Other Ambulatory Visit: Payer: Self-pay

## 2018-12-15 DIAGNOSIS — E1165 Type 2 diabetes mellitus with hyperglycemia: Secondary | ICD-10-CM

## 2018-12-15 MED ORDER — INSULIN LISPRO (1 UNIT DIAL) 100 UNIT/ML (KWIKPEN): 10.0000 [IU] | PEN_INJECTOR | Freq: Three times a day (TID) | SUBCUTANEOUS | 2 refills | Status: DC

## 2019-01-08 ENCOUNTER — Ambulatory Visit: Payer: Self-pay | Admitting: Family Medicine

## 2019-01-10 ENCOUNTER — Telehealth: Payer: Self-pay

## 2019-01-10 ENCOUNTER — Ambulatory Visit: Payer: 59 | Admitting: Family Medicine

## 2019-01-10 ENCOUNTER — Other Ambulatory Visit: Payer: Self-pay

## 2019-01-10 ENCOUNTER — Encounter: Payer: Self-pay | Admitting: Family Medicine

## 2019-01-10 VITALS — BP 144/92 | HR 81 | Temp 98.3°F | Resp 14 | Ht 62.0 in | Wt 242.8 lb

## 2019-01-10 DIAGNOSIS — F411 Generalized anxiety disorder: Secondary | ICD-10-CM | POA: Diagnosis not present

## 2019-01-10 DIAGNOSIS — Z23 Encounter for immunization: Secondary | ICD-10-CM | POA: Diagnosis not present

## 2019-01-10 DIAGNOSIS — E119 Type 2 diabetes mellitus without complications: Secondary | ICD-10-CM

## 2019-01-10 DIAGNOSIS — E785 Hyperlipidemia, unspecified: Secondary | ICD-10-CM

## 2019-01-10 DIAGNOSIS — R000 Tachycardia, unspecified: Secondary | ICD-10-CM | POA: Diagnosis not present

## 2019-01-10 DIAGNOSIS — Z794 Long term (current) use of insulin: Secondary | ICD-10-CM

## 2019-01-10 DIAGNOSIS — F430 Acute stress reaction: Secondary | ICD-10-CM

## 2019-01-10 DIAGNOSIS — R002 Palpitations: Secondary | ICD-10-CM | POA: Diagnosis not present

## 2019-01-10 DIAGNOSIS — I491 Atrial premature depolarization: Secondary | ICD-10-CM

## 2019-01-10 DIAGNOSIS — I10 Essential (primary) hypertension: Secondary | ICD-10-CM

## 2019-01-10 DIAGNOSIS — F410 Panic disorder [episodic paroxysmal anxiety] without agoraphobia: Secondary | ICD-10-CM

## 2019-01-10 MED ORDER — FLUOXETINE HCL 20 MG PO TABS: 20.0000 mg | ORAL_TABLET | Freq: Every day | ORAL | 3 refills | Status: DC

## 2019-01-10 MED ORDER — PROPRANOLOL HCL 10 MG PO TABS: 10.0000 mg | ORAL_TABLET | Freq: Three times a day (TID) | ORAL | 0 refills | Status: DC | PRN

## 2019-01-10 MED ORDER — LORAZEPAM 1 MG PO TABS: 0.5000 mg | ORAL_TABLET | Freq: Three times a day (TID) | ORAL | 0 refills | Status: DC | PRN

## 2019-01-10 NOTE — Telephone Encounter (Signed)
Pt states you were going to give her a handicap form

## 2019-01-10 NOTE — Telephone Encounter (Signed)
Thanks right - I'll do it and mail it to her

## 2019-01-10 NOTE — Progress Notes (Signed)
   01/10/19 0950  GAD-7 Over the last 2 weeks, how often have you been bothered by the following problems?  1. Feeling Nervous, Anxious, or on Edge 3  2. Not Being Able to Stop or Control Worrying 3  3. Worrying Too Much About Different Things 3  4. Trouble Relaxing 3  5. Being So Restless it's Hard To Sit Still 3  6. Becoming Easily Annoyed or Irritable 3  7. Feeling Afraid As If Something Awful Might Happen 1  Total GAD-7 Score 19
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
## 2019-01-11 DIAGNOSIS — Z23 Encounter for immunization: Secondary | ICD-10-CM | POA: Diagnosis not present

## 2019-01-11 LAB — TSH: TSH: 1.59 mIU/L (ref 0.40–4.50)

## 2019-01-11 LAB — COMPLETE METABOLIC PANEL WITH GFR
AG Ratio: 1.6 (calc) (ref 1.0–2.5)
ALT: 24 U/L (ref 6–29)
AST: 26 U/L (ref 10–35)
Albumin: 4.5 g/dL (ref 3.6–5.1)
Alkaline phosphatase (APISO): 67 U/L (ref 37–153)
BUN: 11 mg/dL (ref 7–25)
CO2: 30 mmol/L (ref 20–32)
Calcium: 10.4 mg/dL (ref 8.6–10.4)
Chloride: 102 mmol/L (ref 98–110)
Creat: 0.72 mg/dL (ref 0.50–1.05)
GFR, Est African American: 107 mL/min/{1.73_m2} (ref >=60)
GFR, Est Non African American: 92 mL/min/{1.73_m2} (ref >=60)
Globulin: 2.8 g/dL (calc) (ref 1.9–3.7)
Glucose, Bld: 231 mg/dL — ABNORMAL HIGH (ref 65–99)
Potassium: 4.5 mmol/L (ref 3.5–5.3)
Sodium: 139 mmol/L (ref 135–146)
Total Bilirubin: 0.5 mg/dL (ref 0.2–1.2)
Total Protein: 7.3 g/dL (ref 6.1–8.1)

## 2019-01-11 LAB — CBC WITH DIFFERENTIAL/PLATELET
Absolute Monocytes: 506 cells/uL (ref 200–950)
Basophils Absolute: 58 cells/uL (ref 0–200)
Basophils Relative: 0.9 %
Eosinophils Absolute: 154 cells/uL (ref 15–500)
Eosinophils Relative: 2.4 %
HCT: 48 % — ABNORMAL HIGH (ref 35.0–45.0)
Hemoglobin: 15.7 g/dL — ABNORMAL HIGH (ref 11.7–15.5)
Lymphs Abs: 2739 cells/uL (ref 850–3900)
MCH: 29.3 pg (ref 27.0–33.0)
MCHC: 32.7 g/dL (ref 32.0–36.0)
MCV: 89.6 fL (ref 80.0–100.0)
MPV: 10.6 fL (ref 7.5–12.5)
Monocytes Relative: 7.9 %
Neutro Abs: 2944 cells/uL (ref 1500–7800)
Neutrophils Relative %: 46 %
Platelets: 247 10*3/uL (ref 140–400)
RBC: 5.36 10*6/uL — ABNORMAL HIGH (ref 3.80–5.10)
RDW: 13.1 % (ref 11.0–15.0)
Total Lymphocyte: 42.8 %
WBC: 6.4 10*3/uL (ref 3.8–10.8)

## 2019-01-11 LAB — LIPID PANEL
Cholesterol: 200 mg/dL — ABNORMAL HIGH (ref <200)
HDL: 56 mg/dL (ref >=50)
LDL Cholesterol (Calc): 112 mg/dL (calc) — ABNORMAL HIGH
Non-HDL Cholesterol (Calc): 144 mg/dL (calc) — ABNORMAL HIGH (ref <130)
Total CHOL/HDL Ratio: 3.6 (calc) (ref <5.0)
Triglycerides: 200 mg/dL — ABNORMAL HIGH (ref <150)

## 2019-01-11 NOTE — Addendum Note (Signed)
Addended by: Docia Furl on: 01/11/2019 12:55 PM   Modules accepted: Orders

## 2019-01-31 ENCOUNTER — Other Ambulatory Visit: Payer: Self-pay

## 2019-01-31 ENCOUNTER — Encounter: Payer: Self-pay | Admitting: Family Medicine

## 2019-01-31 ENCOUNTER — Ambulatory Visit (INDEPENDENT_AMBULATORY_CARE_PROVIDER_SITE_OTHER): Payer: 59 | Admitting: Family Medicine

## 2019-01-31 DIAGNOSIS — F411 Generalized anxiety disorder: Secondary | ICD-10-CM

## 2019-01-31 DIAGNOSIS — F410 Panic disorder [episodic paroxysmal anxiety] without agoraphobia: Secondary | ICD-10-CM

## 2019-01-31 DIAGNOSIS — I491 Atrial premature depolarization: Secondary | ICD-10-CM | POA: Diagnosis not present

## 2019-01-31 DIAGNOSIS — R002 Palpitations: Secondary | ICD-10-CM | POA: Diagnosis not present

## 2019-01-31 DIAGNOSIS — E785 Hyperlipidemia, unspecified: Secondary | ICD-10-CM | POA: Diagnosis not present

## 2019-01-31 DIAGNOSIS — F430 Acute stress reaction: Secondary | ICD-10-CM

## 2019-01-31 MED ORDER — METOPROLOL SUCCINATE ER 25 MG PO TB24: 25.0000 mg | ORAL_TABLET | Freq: Every day | ORAL | 1 refills | Status: DC

## 2019-01-31 MED ORDER — ROSUVASTATIN CALCIUM 40 MG PO TABS: 40.0000 mg | ORAL_TABLET | Freq: Every day | ORAL | 3 refills | Status: DC

## 2019-01-31 NOTE — Progress Notes (Signed)
Name: Alicia Pope   MRN: [id number]    DOB: 01-15-1961   Date:01/31/2019       Progress Note  Subjective:    Chief Complaint  Chief Complaint  Patient presents with  . Follow-up  . Depression    I connected with  [name]  on 01/31/19 at  9:00 AM EDT by a video enabled telemedicine application and verified that I am speaking with the correct person using two identifiers.  I discussed the limitations of evaluation and management by telemedicine and the availability of in person appointments. The patient expressed understanding and agreed to proceed. Staff also discussed with the patient that there may be a patient responsible charge related to this service. Patient Location: home Provider Location: [facility] clinic Additional Individuals present: none  HPI  Follow up on Palpitations: She started taking propanolol 10 mg TID and it is helping some with the sx, palpitations feel less intense, but she feels them all the time, no change (not better not worse ) with exertion or rest, no change with positional changes No concerns or SE of medicine, no associated fatigue, exertional dyspnea, near syncope, LE edema (at baseline), orthopnea, weight changes.  Referral put in, but she has not heard from cardiology and no appt scheduled as of today -   GERD/GI - sx better with protonix not having pain, reflux, indigestion  Anxiety - well controlled right now on prozac, was given a refill on ativan to try when she has palpitations and feels anxious, but she has not tried it.  Sometimes she will hyperventilate or feel like its hard to breath but usually it occurs with worse anxiety, lasts for only a few minutes and resolves when she "tries to calm down."  Needs refill on cholesterol meds, no other concerns right now - she has endocrinology f/up in 2 weeks Will come after than to do her routine f/up ov   Patient Active Problem List   Diagnosis Date Noted  . Diabetes ([place]) 04/29/2016  .  Insomnia 04/02/2015  . Obesity 09/23/2014  . Vitamin D deficiency 09/23/2014  . Screening for colorectal cancer 09/23/2014  . Anxiety   . Depression   . GERD (gastroesophageal reflux disease)   . Hypertension   . Hyperlipidemia     Social History   Tobacco Use  . Smoking status: Never Smoker  . Smokeless tobacco: Never Used  Substance Use Topics  . Alcohol use: No     Current Outpatient Medications:  .  aspirin 81 MG tablet, Take 1 tablet (81 mg total) by mouth daily., Disp: 30 tablet, Rfl: 11 .  [employer] D3 2000 units CAPS, Take 2 capsules (4,000 Units total) by mouth daily., Disp: 30 each, Rfl: 3 .  FLUoxetine (PROZAC) 20 MG tablet, Take 1 tablet (20 mg total) by mouth daily., Disp: 30 tablet, Rfl: 3 .  Insulin Glargine (LANTUS SOLOSTAR) 100 UNIT/ML Solostar Pen, Inject 70 Units into the skin daily., Disp: 30 mL, Rfl: 6 .  insulin lispro (HUMALOG KWIKPEN) 100 UNIT/ML KwikPen, Inject 0.1 mLs (10 Units total) into the skin 3 (three) times daily with meals. Inject 10 units under the skin three times daily before meals., Disp: 9 mL, Rfl: 2 .  lisinopril-hydrochlorothiazide (ZESTORETIC) 20-12.5 MG tablet, Take 1 tablet by mouth daily., Disp: 90 tablet, Rfl: 3 .  LORazepam (ATIVAN) 1 MG tablet, Take 0.5-1 tablets (0.5-1 mg total) by mouth every 8 (eight) hours as needed for anxiety., Disp: 90 tablet, Rfl: 0 .  mirtazapine (REMERON) 30 MG tablet, TAKE 1 TABLET BY MOUTH EVERYDAY AT BEDTIME, Disp: 90 tablet, Rfl: 0 .  pantoprazole (PROTONIX) 40 MG tablet, Take 1 tablet (40 mg total) by mouth daily., Disp: 30 tablet, Rfl: 3 .  rosuvastatin (CRESTOR) 40 MG tablet, TAKE 1 TABLET BY MOUTH EVERY DAY, Disp: 90 tablet, Rfl: 3 .  tizanidine (ZANAFLEX) 2 MG capsule, Take 1 capsule (2 mg total) by mouth 3 (three) times daily as needed for muscle spasms., Disp: 90 capsule, Rfl: 1 .  blood glucose meter kit and supplies KIT, Inject 1 each into the skin as directed. Dispense based on patient and insurance  preference. Check blood sugar fasting each morning.  ICD E11.65, Disp: 1 each, Rfl: 0 .  Blood Glucose Monitoring Suppl (FREESTYLE LITE) DEVI, UAD.E11.65, Disp: , Rfl: 0 .  chlorhexidine gluconate, MEDLINE KIT, (PERIDEX) 0.12 % solution, Use as directed 15 mLs in the mouth or throat daily. (Patient not taking: Reported on 01/31/2019), Disp: 120 mL, Rfl: 0 .  glucose blood test strip, Dispense based on patient and insurance preference.  Check blood sugar fasting each morning.  ICD E11.65, Disp: 50 each, Rfl: 12 .  Lancets MISC, Dispense based on patient and insurance preference.  Check blood sugar fasting each morning.  ICD E11.65, Disp: 100 each, Rfl: 3  Allergies  Allergen Reactions  . Contrast Media [Iodinated Diagnostic Agents] Anaphylaxis  . Ambien [Zolpidem Tartrate] Other (See Comments)    Family has told her that after taking Ambien, she does "crazy things" that she does not remember doing.   . Codeine Nausea And Vomiting    I personally reviewed active problem list, medication list, allergies, family history, social history, health maintenance, notes from last encounter, lab results with the patient/caregiver today.  Review of Systems  Constitutional: Negative.  Negative for diaphoresis, malaise/fatigue and weight loss.  HENT: Negative.   Eyes: Negative.  Negative for blurred vision and double vision.  Respiratory: Negative.  Negative for cough, hemoptysis, sputum production and wheezing.   Cardiovascular: Positive for palpitations. Negative for chest pain, orthopnea, claudication, leg swelling and PND.  Gastrointestinal: Negative.  Negative for abdominal pain, blood in stool, constipation, diarrhea, heartburn, melena, nausea and vomiting.  Musculoskeletal: Negative.   Skin: Negative.   Neurological: Negative.  Negative for dizziness, tingling, tremors, sensory change, focal weakness, weakness and headaches.  Endo/Heme/Allergies: Negative.   Psychiatric/Behavioral: Negative for  depression and suicidal ideas. The patient is nervous/anxious. The patient does not have insomnia.   All other systems reviewed and are negative.    Objective:   Virtual encounter, vitals limited, only able to obtain the following There were no vitals filed for this visit. There is no height or weight on file to calculate BMI. Nursing Note and Vital Signs reviewed.  Physical Exam Vitals signs and nursing note reviewed.  Constitutional:      General: She is not in acute distress.    Appearance: She is well-developed. She is obese. She is not ill-appearing, toxic-appearing or diaphoretic.  HENT:     Head: Normocephalic and atraumatic.     Nose: Nose normal.  Eyes:     General:        Right eye: No discharge.        Left eye: No discharge.     Conjunctiva/sclera: Conjunctivae normal.  Neck:     Trachea: No tracheal deviation.  Pulmonary:     Effort: Pulmonary effort is normal. No respiratory distress.     Breath sounds: No  stridor.  Musculoskeletal: Normal range of motion.  Skin:    Coloration: Skin is not jaundiced or pale.     Findings: No rash.  Neurological:     Mental Status: She is alert.     Motor: No abnormal muscle tone.     Coordination: Coordination normal.  Psychiatric:        Mood and Affect: Mood normal.        Behavior: Behavior normal.     PE limited by telephone encounter  No results found for this or any previous visit (from the past 72 hour(s)).  Assessment and Plan:     ICD-10-CM   1. Palpitations  R00.2 metoprolol succinate (TOPROL-XL) 25 MG 24 hr tablet   continuous sx, mild improvement with inderal, but hard to take TID, will switch to toprol XL 25 mg, f/up on referral to cardiology, feel she needs holter/ECHO  2. PAC (premature atrial contraction)  I49.1 metoprolol succinate (TOPROL-XL) 25 MG 24 hr tablet   see above, very sx still  3. Generalized anxiety disorder with panic attacks  F41.1    F41.0    still fairly well controlled on  current meds, not using ativan  4. Anxiety as acute reaction to exceptional stress  F41.1    F43.0    still dealing with caring for multiple family members but seems to be handeling the stress a little better, con't same meds for anxiety, encouraged self care  5. Hyperlipidemia, unspecified hyperlipidemia type  E78.5 rosuvastatin (CRESTOR) 40 MG tablet   med refill, due for f/up and labs, will come to do that in 1-2 months    Reviewed concerning signs and symptoms which patient should reach out for immediate evaluation including chest pain, shortness of breath, near syncope, orthopnea, sudden exertional dyspnea or lower extremity edema changes.  - I discussed the assessment and treatment plan with the patient. The patient was provided an opportunity to ask questions and all were answered. The patient agreed with the plan and demonstrated an understanding of the instructions.  I provided 18 minutes of non-face-to-face time during this encounter.  Return in about 1 month (around 03/03/2019) for Routine follow-up (f/up on all chronic conditions, labs).   Delsa Grana, PA-C 01/31/19 9:34 AM

## 2019-02-01 ENCOUNTER — Other Ambulatory Visit: Payer: Self-pay | Admitting: Family Medicine

## 2019-02-01 DIAGNOSIS — F411 Generalized anxiety disorder: Secondary | ICD-10-CM

## 2019-02-01 DIAGNOSIS — F410 Panic disorder [episodic paroxysmal anxiety] without agoraphobia: Secondary | ICD-10-CM

## 2019-02-01 DIAGNOSIS — F430 Acute stress reaction: Secondary | ICD-10-CM

## 2019-02-09 ENCOUNTER — Other Ambulatory Visit: Payer: Self-pay

## 2019-02-13 ENCOUNTER — Ambulatory Visit: Payer: 59 | Admitting: Endocrinology

## 2019-02-20 ENCOUNTER — Encounter: Payer: Self-pay | Admitting: Cardiology

## 2019-02-20 ENCOUNTER — Ambulatory Visit: Payer: 59 | Admitting: Cardiology

## 2019-02-20 ENCOUNTER — Other Ambulatory Visit: Payer: Self-pay

## 2019-02-20 VITALS — BP 164/82 | HR 71 | Temp 97.1°F | Ht 62.0 in | Wt 247.8 lb

## 2019-02-20 DIAGNOSIS — R002 Palpitations: Secondary | ICD-10-CM

## 2019-02-20 DIAGNOSIS — I10 Essential (primary) hypertension: Secondary | ICD-10-CM

## 2019-02-20 DIAGNOSIS — F419 Anxiety disorder, unspecified: Secondary | ICD-10-CM

## 2019-02-20 DIAGNOSIS — R011 Cardiac murmur, unspecified: Secondary | ICD-10-CM | POA: Insufficient documentation

## 2019-02-20 NOTE — Patient Instructions (Signed)
Medication Instructions:  CONTINUE YOUR MEDICATION - BETA BLOCKER  *If you need a refill on your cardiac medications before your next appointment, please call your pharmacy*  Lab Work: NOT NEEDED  Testing/Procedures: Will be schedule at [facility] [address] Your physician has requested that you have an echocardiogram. Echocardiography is a painless test that uses sound waves to create images of your heart. It provides your doctor with information about the size and shape of your heart and how well your heart's chambers and valves are working. This procedure takes approximately one hour. There are no restrictions for this procedure. AND Your physician has recommended that you wear a  48 HOURS--holter monitor. Holter monitors are medical devices that record the heart's electrical activity. Doctors most often use these monitors to diagnose arrhythmias. Arrhythmias are problems with the speed or rhythm of the heartbeat. The monitor is a small, portable device. You can wear one while you do your normal daily activities. This is usually used to diagnose what is causing palpitations/syncope (passing out).   Follow-Up: At [facility], you and your health needs are our priority.  As part of our continuing mission to provide you with exceptional heart care, we have created designated Provider Care Teams.  These Care Teams include your primary Cardiologist (physician) and Advanced Practice Providers (APPs -  Physician Assistants and Nurse Practitioners) who all work together to provide you with the care you need, when you need it.  Your next appointment:    2 MONTHS  The format for your next appointment:   In Person  Provider:   Bryan Lemma, MD  Other Instructions    ZIO XT- Long Term Monitor Instructions   Your physician has requested you wear your ZIO patch monitor___2____days.   This is a single patch monitor.  [employer] supplies one patch monitor per enrollment.   Additional stickers are not available.   Please do not apply patch if you will be having a Nuclear Stress Test, Echocardiogram, Cardiac CT, MRI, or Chest Xray during the time frame you would be wearing the monitor. The patch cannot be worn during these tests.  You cannot remove and re-apply the ZIO XT patch monitor.   Your ZIO patch monitor will be sent USPS Priority mail from [facility] directly to your home address. The monitor may also be mailed to a PO BOX if home delivery is not available.   It may take 3-5 days to receive your monitor after you have been enrolled.   Once you have received you monitor, please review enclosed instructions.  Your monitor has already been registered assigning a specific monitor serial # to you.   Applying the monitor   Shave hair from upper left chest.   Hold abrader disc by orange tab.  Rub abrader in 40 strokes over left upper chest as indicated in your monitor instructions.   Clean area with 4 enclosed alcohol pads .  Use all pads to assure are is cleaned thoroughly.  Let dry.   Apply patch as indicated in monitor instructions.  Patch will be place under collarbone on left side of chest with arrow pointing upward.   Rub patch adhesive wings for 2 minutes.Remove white label marked "1".  Remove white label marked "2".  Rub patch adhesive wings for 2 additional minutes.   While looking in a mirror, press and release button in center of patch.  A small green light will flash 3-4 times .  This will be your only  indicator the monitor has been turned on.     Do not shower for the first 24 hours.  You may shower after the first 24 hours.   Press button if you feel a symptom. You will hear a small click.  Record Date, Time and Symptom in the Patient Log Book.   When you are ready to remove patch, follow instructions on last 2 pages of Patient Log Book.  Stick patch monitor onto last page of Patient Log Book.   Place Patient Log Book in [place] and  [place] box.  Use locking tab on box and tape box closed securely.  The [employer] and [employer] has [employer] on it.  Please place in mailbox as soon as possible.  Your physician should have your test results approximately 7 days after the monitor has been mailed back to [facility].   Call [place] [facility] at [phone] if you have questions regarding your ZIO XT patch monitor.  Call them immediately if you see an orange light blinking on your monitor.   If your monitor falls off in less than 4 days contact our Monitor department at [phone].  If your monitor becomes loose or falls off after 4 days call [employer] at [phone] for suggestions on securing your monitor.   XT

## 2019-02-20 NOTE — Progress Notes (Signed)
Primary Care Provider: Delsa Grana, PA-C Cardiologist: No primary care provider on file. Electrophysiologist:   Clinic Note: No chief complaint on file.   HPI:    Alicia Pope is a 58 y.o. female who is being seen today for the evaluation of PALPITATIONS at the request of Delsa Grana, PA-C.  Alicia Pope was recently seen by her PCP, indicating some episodes of palpitations.  Noted PACs on her EKG. -- started on Propranolol 10 mg TID - helping some.  -> Changed to Toprol 25 mg   Recent Hospitalizations: ER visit for dental pain on August 6  Reviewed  CV studies:    The following studies were reviewed today: (if available, images/films reviewed: From Epic Chart or Care Everywhere)  None:   Interval History:   Alicia Pope presents here today with about a 15-monthhistory of feeling irregular heartbeats, skipped beats somewhat forceful beats as our heart rate is pounding not started all that fast or irregular, just a forceful pounding heartbeat.  She usually feels this at night when she is lying down to try to sleep.  She is also noted it when she has been sitting down to watch TV in the evening.  It does not occur during the day when she is active.  She indicates that she has been under quite a bit of stress from a social standpoint recently her grandmother had a stroke, and mother had some other health issues.  Exam is really noted with having the unusual sensation in her chest, but no sense of a fast heartbeat.  No lightheadedness or dizziness, or syncope/near syncope.  Does not describe as a chest pain just a discomfort.  She does note that since she went to the long-acting Toprol, these episodes have improved.  CV Review of Symptoms (Summary)  no chest pain or dyspnea on exertion positive for - palpitations and Described as a regular pounding forceful heartbeat negative for - edema, orthopnea, paroxysmal nocturnal dyspnea, shortness of breath or Syncope/near  syncope, TIA/amaurosis fugax  The patient does not have symptoms concerning for COVID-19 infection (fever, chills, cough, or new shortness of breath).  The patient is practicing social distancing. ++ Masking.  ++ Groceries/shopping.    REVIEWED OF SYSTEMS   A comprehensive ROS was performed. Review of Systems  Constitutional: Negative for malaise/fatigue (Just because she is not really sleeping very well) and weight loss.  HENT: Negative for congestion.   Respiratory: Negative for shortness of breath.   Gastrointestinal: Negative for blood in stool and melena.  Musculoskeletal: Negative for joint pain.  Neurological: Negative for dizziness and headaches.  Psychiatric/Behavioral: Negative for depression (Not necessarily depression, but certainly has a depressed mood.) and memory loss. The patient is nervous/anxious and has insomnia.        Lots of social stressors  All other systems reviewed and are negative.   I have reviewed and (if needed) personally updated the patient's problem list, medications, allergies, past medical and surgical history, social and family history.   PAST MEDICAL HISTORY   Past Medical History:  Diagnosis Date   Allergy    Anxiety 2013   Asthma 2015   Asthma    Depression 2013   Diabetes mellitus 2004   GERD (gastroesophageal reflux disease) 2013   Hyperlipidemia 1992   Hypertension 1992   Memory impairment      PAST SURGICAL HISTORY   Past Surgical History:  [place]   CESAREAN SECTION     [phone]   CHOLECYSTECTOMY  1987   uterine ablation       MEDICATIONS/ALLERGIES   Current Meds  Medication Sig   aspirin 81 MG tablet Take 1 tablet (81 mg total) by mouth daily.   blood glucose meter kit and supplies KIT Inject 1 each into the skin as directed. Dispense based on patient and insurance preference. Check blood sugar fasting each morning.   ICD E11.65   Blood Glucose Monitoring Suppl (FREESTYLE LITE) DEVI UAD.E11.65   chlorhexidine gluconate, MEDLINE KIT, (PERIDEX) 0.12 % solution Use as directed 15 mLs in the mouth or throat daily.   [employer] D3 2000 units CAPS Take 2 capsules (4,000 Units total) by mouth daily.   FLUoxetine (PROZAC) 20 MG tablet TAKE 1 TABLET BY MOUTH EVERY DAY   glucose blood test strip Dispense based on patient and insurance preference.  Check blood sugar fasting each morning.  ICD E11.65   Insulin Glargine (LANTUS SOLOSTAR) 100 UNIT/ML Solostar Pen Inject 70 Units into the skin daily.   insulin lispro (HUMALOG KWIKPEN) 100 UNIT/ML KwikPen Inject 0.1 mLs (10 Units total) into the skin 3 (three) times daily with meals. Inject 10 units under the skin three times daily before meals.   Lancets MISC Dispense based on patient and insurance preference.  Check blood sugar fasting each morning.  ICD E11.65   lisinopril-hydrochlorothiazide (ZESTORETIC) 20-12.5 MG tablet Take 1 tablet by mouth daily.   LORazepam (ATIVAN) 1 MG tablet Take 0.5-1 tablets (0.5-1 mg total) by mouth every 8 (eight) hours as needed for anxiety.   metoprolol succinate (TOPROL-XL) 25 MG 24 hr tablet Take 1 tablet (25 mg total) by mouth daily.   mirtazapine (REMERON) 30 MG tablet TAKE 1 TABLET BY MOUTH EVERYDAY AT BEDTIME   pantoprazole (PROTONIX) 40 MG tablet Take 1 tablet (40 mg total) by mouth daily.   rosuvastatin (CRESTOR) 40 MG tablet Take 1 tablet (40 mg total) by mouth daily.   tizanidine (ZANAFLEX) 2 MG capsule Take 1 capsule (2 mg total) by mouth 3 (three) times daily as needed for muscle spasms.    Allergies  Allergen Reactions   Contrast Media [Iodinated Diagnostic Agents] Anaphylaxis   Ambien [Zolpidem Tartrate] Other (See Comments)    Family has told her that after taking Ambien, she does "crazy things" that she does not remember doing.    Codeine Nausea And Vomiting     SOCIAL HISTORY/FAMILY HISTORY   Social  History   Tobacco Use   Smoking status: Never Smoker   Smokeless tobacco: Never Used  Substance Use Topics   Alcohol use: No   Drug use: No   Social History   Social History Narrative   Married.    Lives with husband and youngest son, who is 29 y/o.   Has 4 children.   Does not work outside of home.    No formal exercise. Only activity around the house.   Tries to be careful with Diabetic diet.    Family History family history includes Cancer in her father, maternal grandfather, and paternal grandmother; Depression in her father; Diabetes in her paternal grandfather; Kidney disease in her paternal grandfather.   OBJCTIVE -PE, EKG, labs   Wt Readings from Last 3 Encounters:  02/20/19 247 lb 12.8 oz (112.4 kg)  01/10/19 242 lb 12.8 oz (110.1 kg)  12/13/18 245 lb 12.8 oz (111.5 kg)    Physical Exam: BP (!) 164/82    Pulse  71    Temp (!) 97.1 F (36.2 C)    Ht _0  (1.575 m)    Wt 247 lb 12.8 oz (112.4 kg)    SpO2 95%    BMI 45.32 kg/m  Physical Exam  Constitutional: She is oriented to person, place, and time. She appears well-developed and well-nourished. No distress.  Morbidly obese woman.  Well-groomed  HENT:  Head: Normocephalic and atraumatic.  Neck: Normal range of motion. Neck supple. No hepatojugular reflux and no JVD present. Carotid bruit is not present.  Cardiovascular: Normal rate, regular rhythm, S1 normal, S2 normal and intact distal pulses. Frequent extrasystoles are present. PMI is not displaced. Exam reveals distant heart sounds. Exam reveals no gallop and no friction rub.  No murmur (1-2/6 SEM at RUSB.) heard. Pulmonary/Chest: Effort normal and breath sounds normal. No respiratory distress. She has no wheezes. She has no rales.  Abdominal: Soft. Bowel sounds are normal. She exhibits no distension. There is no abdominal tenderness. There is no rebound.  Truncal obesity  Musculoskeletal: Normal range of motion.        General: Edema (Trivial pedal)  present.  Neurological: She is alert and oriented to person, place, and time. No cranial nerve deficit.  Skin: Skin is warm.  Psychiatric: She has a normal mood and affect. Her behavior is normal. Judgment and thought content normal.  Vitals reviewed.   Adult ECG Report From PCP office 01/10/2019: Rate: 72 ;  Rhythm: normal sinus rhythm, premature atrial contractions (PAC) and Left axis deviation (-33), borderline voltage, not quite enough to consider LVH.;  Normal intervals and durations.;   Narrative Interpretation: Borderline EKG  Recent Labs:    Lab Results  Component Value Date   CHOL 200 (H) 01/10/2019   HDL 56 01/10/2019   LDLCALC 112 (H) 01/10/2019   TRIG 200 (H) 01/10/2019   CHOLHDL 3.6 01/10/2019   Lab Results  Component Value Date   CREATININE 0.72 01/10/2019   BUN 11 01/10/2019   NA 139 01/10/2019   K 4.5 01/10/2019   CL 102 01/10/2019   CO2 30 01/10/2019    ASSESSMENT/PLAN    Problem List Items Addressed This Visit    Anxiety   Hypertension - Primary   Systolic ejection murmur   Relevant Orders   Holter monitor - 48 hour   ECHOCARDIOGRAM COMPLETE    Other Visit Diagnoses    Palpitations       Relevant Orders   Holter monitor - 48 hour   ECHOCARDIOGRAM COMPLETE     Alicia Pope is describing having pretty frequent episodes of what sounds to me like nocturnal PACs or PVCs.  EKG does show PACs, however symptoms sound more like PVCs.  She says she is doing better on the current dose of beta-blocker.  She definitely has blood pressure room and heart rate room to continue beta-blocker. I would like to know what we are treating.  Since she is still having symptoms, should be okay.   Plan: We will check a 48-hour monitor based on her frequency of palpitations, we should be able to capture concerning features. Given the presence of a murmur improving palpitations, would like to exclude a structural automatic with a 2D echo.   COVID-19 Education: The signs and  symptoms of COVID-19 were discussed with the patient and how to seek care for testing (follow up with PCP or arrange E-visit).   The importance of social distancing was discussed today.  I spent a total of 56mnutes with the  patient and chart review. >  50% of the time was spent in direct patient consultation.  Additional time spent with chart review (studies, outside notes, etc): 6 Total Time: 30 min   Current medicines are reviewed at length with the patient today.  (+/- concerns) n/a   Patient Instructions / Medication Changes & Studies & Tests Ordered   Patient Instructions  Medication Instructions:  CONTINUE YOUR MEDICATION - BETA BLOCKER  *If you need a refill on your cardiac medications before your next appointment, please call your pharmacy*  Lab Work: NOT NEEDED  Testing/Procedures: Will be schedule at [facility] has requested that you have an echocardiogram. Echocardiography is a painless test that uses sound waves to create images of your heart. It provides your doctor with information about the size and shape of your heart and how well your hearts chambers and valves are working. This procedure takes approximately one hour. There are no restrictions for this procedure. AND Your physician has recommended that you wear a  48 HOURS--holter monitor. Holter monitors are medical devices that record the hearts electrical activity. Doctors most often use these monitors to diagnose arrhythmias. Arrhythmias are problems with the speed or rhythm of the heartbeat. The monitor is a small, portable device. You can wear one while you do your normal daily activities. This is usually used to diagnose what is causing palpitations/syncope (passing out).   Follow-Up: At [facility], you and your health needs are our priority.  As part of our continuing mission to provide you with exceptional heart care, we have created designated Provider Care Teams.   These Care Teams include your primary Cardiologist (physician) and Advanced Practice Providers (APPs -  Physician Assistants and Nurse Practitioners) who all work together to provide you with the care you need, when you need it.  Your next appointment:    2 MONTHS  The format for your next appointment:   In Person  Provider:   Glenetta Hew, MD  Other Instructions    ZIO XT- Long Term Monitor Instructions   Your physician has requested you wear your ZIO patch monitor___2____days.   This is a single patch monitor.  [employer] supplies one patch monitor per enrollment.  Additional stickers are not available.   Please do not apply patch if you will be having a Nuclear Stress Test, Echocardiogram, Cardiac CT, MRI, or Chest Xray during the time frame you would be wearing the monitor. The patch cannot be worn during these tests.  You cannot remove and re-apply the ZIO XT patch monitor.   Your ZIO patch monitor will be sent USPS Priority mail from [facility] directly to your home address. The monitor may also be mailed to a PO BOX if home delivery is not available.   It may take 3-5 days to receive your monitor after you have been enrolled.   Once you have received you monitor, please review enclosed instructions.  Your monitor has already been registered assigning a specific monitor serial # to you.   Applying the monitor   Shave hair from upper left chest.   Hold abrader disc by orange tab.  Rub abrader in 40 strokes over left upper chest as indicated in your monitor instructions.   Clean area with 4 enclosed alcohol pads .  Use all pads to assure are is cleaned thoroughly.  Let dry.   Apply patch as indicated in monitor instructions.  Patch will be place under collarbone on left side  of chest with arrow pointing upward.   Rub patch adhesive wings for 2 minutes.Remove white label marked "1".  Remove white label marked "2".  Rub patch adhesive wings for 2 additional minutes.     While looking in a mirror, press and release button in center of patch.  A small green light will flash 3-4 times .  This will be your only indicator the monitor has been turned on.     Do not shower for the first 24 hours.  You may shower after the first 24 hours.   Press button if you feel a symptom. You will hear a small click.  Record Date, Time and Symptom in the Patient Log Book.   When you are ready to remove patch, follow instructions on last 2 pages of Patient Log Book.  Stick patch monitor onto last page of Patient Log Book.   Place Patient Log Book in [place] and [place] box.  Use locking tab on box and tape box closed securely.  The [employer] and [employer] has [employer] on it.  Please place in mailbox as soon as possible.  Your physician should have your test results approximately 7 days after the monitor has been mailed back to [facility].   Call [place] at [phone] if you have questions regarding your ZIO XT patch monitor.  Call them immediately if you see an orange light blinking on your monitor.   If your monitor falls off in less than 4 days contact our Monitor department at [phone].  If your monitor becomes loose or falls off after 4 days call [employer] at [phone] for suggestions on securing your monitor.   XT      Studies Ordered:   Orders Placed This Encounter  Procedures   Holter monitor - 48 hour   ECHOCARDIOGRAM COMPLETE     Glenetta Hew, M.D., M.S. Interventional Cardiologist   Pager # [phone] Phone # [phone] [place], [place] [address]   Thank you for choosing Heartcare at [facility]!!

## 2019-02-21 ENCOUNTER — Ambulatory Visit: Payer: 59 | Admitting: Endocrinology

## 2019-02-22 ENCOUNTER — Encounter: Payer: Self-pay | Admitting: Cardiology

## 2019-02-24 ENCOUNTER — Other Ambulatory Visit: Payer: Self-pay | Admitting: Family Medicine

## 2019-02-24 DIAGNOSIS — R002 Palpitations: Secondary | ICD-10-CM

## 2019-02-24 DIAGNOSIS — I491 Atrial premature depolarization: Secondary | ICD-10-CM

## 2019-02-26 ENCOUNTER — Telehealth: Payer: Self-pay

## 2019-02-26 NOTE — Telephone Encounter (Signed)
3 day ZIO ordered and mailed to pt.  

## 2019-03-06 ENCOUNTER — Ambulatory Visit: Payer: 59 | Admitting: Family Medicine

## 2019-03-06 HISTORY — PX: OTHER SURGICAL HISTORY: SHX169

## 2019-03-06 HISTORY — PX: TRANSTHORACIC ECHOCARDIOGRAM: SHX275

## 2019-03-07 ENCOUNTER — Other Ambulatory Visit: Payer: Self-pay

## 2019-03-07 ENCOUNTER — Ambulatory Visit (HOSPITAL_COMMUNITY): Payer: 59 | Attending: Cardiovascular Disease

## 2019-03-07 DIAGNOSIS — R011 Cardiac murmur, unspecified: Secondary | ICD-10-CM | POA: Insufficient documentation

## 2019-03-07 DIAGNOSIS — R002 Palpitations: Secondary | ICD-10-CM | POA: Diagnosis not present

## 2019-03-09 ENCOUNTER — Ambulatory Visit (INDEPENDENT_AMBULATORY_CARE_PROVIDER_SITE_OTHER): Payer: 59

## 2019-03-09 DIAGNOSIS — R011 Cardiac murmur, unspecified: Secondary | ICD-10-CM

## 2019-03-09 DIAGNOSIS — R002 Palpitations: Secondary | ICD-10-CM

## 2019-03-15 ENCOUNTER — Other Ambulatory Visit: Payer: Self-pay

## 2019-03-15 ENCOUNTER — Encounter: Payer: Self-pay | Admitting: Family Medicine

## 2019-03-19 ENCOUNTER — Ambulatory Visit: Payer: 59 | Admitting: Endocrinology

## 2019-03-21 ENCOUNTER — Telehealth: Payer: Self-pay | Admitting: Cardiology

## 2019-03-21 NOTE — Telephone Encounter (Signed)
Alicia Pope from [facility] is calling with some irregular cardiac results regarding the patient.

## 2019-03-21 NOTE — Telephone Encounter (Signed)
Please inform her that the forward monitor is confirming that the patient is having quite a bit of premature beats from the upper chambers of the heart, not that much from the lower chamber.  This is a difficult situation because we are treating premature beats but she also has relatively slow heart rates when she sleeps.  What woke her up was a quick little bursts of couple of those premature beats but nothing dangerous. Unfortunately with a slow heart rate, I really cannot push the metoprolol any further.  We can discuss different options and how we manage it going forward when I see her in follow-up.  Glenetta Hew, MD

## 2019-03-21 NOTE — Telephone Encounter (Signed)
Spoke with pt and the bradycardia occurred probably while a sleep and the 1 episode of SVT pt was awakened with the fast heart beat Report has been printed and will review with Dr Ellyn Hack. cy

## 2019-03-21 NOTE — Telephone Encounter (Signed)
Pt aware of monitor results and will discuss in more detail at upcoming appt ./cy

## 2019-03-24 ENCOUNTER — Other Ambulatory Visit: Payer: Self-pay | Admitting: Endocrinology

## 2019-03-26 ENCOUNTER — Other Ambulatory Visit: Payer: Self-pay | Admitting: Emergency Medicine

## 2019-03-26 DIAGNOSIS — R002 Palpitations: Secondary | ICD-10-CM

## 2019-03-26 DIAGNOSIS — I491 Atrial premature depolarization: Secondary | ICD-10-CM

## 2019-03-27 MED ORDER — METOPROLOL SUCCINATE ER 25 MG PO TB24: 25.0000 mg | ORAL_TABLET | Freq: Every day | ORAL | 1 refills | Status: DC

## 2019-04-11 ENCOUNTER — Ambulatory Visit: Payer: 59 | Admitting: Endocrinology

## 2019-04-20 ENCOUNTER — Other Ambulatory Visit: Payer: Self-pay | Admitting: Family Medicine

## 2019-04-20 DIAGNOSIS — I491 Atrial premature depolarization: Secondary | ICD-10-CM

## 2019-04-20 DIAGNOSIS — R002 Palpitations: Secondary | ICD-10-CM

## 2019-04-24 ENCOUNTER — Encounter: Payer: Self-pay | Admitting: Cardiology

## 2019-04-24 ENCOUNTER — Other Ambulatory Visit: Payer: Self-pay

## 2019-04-24 ENCOUNTER — Ambulatory Visit: Payer: 59 | Admitting: Cardiology

## 2019-04-24 VITALS — BP 133/70 | HR 62 | Temp 97.5°F | Ht 62.0 in | Wt 249.2 lb

## 2019-04-24 DIAGNOSIS — R0600 Dyspnea, unspecified: Secondary | ICD-10-CM | POA: Diagnosis not present

## 2019-04-24 DIAGNOSIS — R011 Cardiac murmur, unspecified: Secondary | ICD-10-CM | POA: Diagnosis not present

## 2019-04-24 DIAGNOSIS — R0609 Other forms of dyspnea: Secondary | ICD-10-CM | POA: Insufficient documentation

## 2019-04-24 DIAGNOSIS — I491 Atrial premature depolarization: Secondary | ICD-10-CM | POA: Insufficient documentation

## 2019-04-24 DIAGNOSIS — R001 Bradycardia, unspecified: Secondary | ICD-10-CM | POA: Diagnosis not present

## 2019-04-24 NOTE — Assessment & Plan Note (Signed)
Difficult to figure out the best option because she has sinus bradycardia but also some is bradycardia with bigeminy with significant PACs.  We will for now continue current dose of metoprolol, leery of pushing at higher.  Need to exclude chronotropic incompetence with her for exercise fatigue and dyspnea.  Plan: GXT (if abnormal, would probably consider coronary CTA)

## 2019-04-24 NOTE — Assessment & Plan Note (Signed)
Frequent PACs-19.4% on monitor.  This is on beta-blocker.  Quite symptomatic and I am worried about her exertional fatigue.  Would exclude ischemia with GXT, but also need to consider the possibility of either antiarrhythmic agent versus even potential referral for ablation.  We will discuss electrophysiology.

## 2019-04-24 NOTE — Assessment & Plan Note (Signed)
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

## 2019-04-24 NOTE — Progress Notes (Signed)
Primary Care Provider: Delsa Grana, PA-C Cardiologist: No primary care provider on file. Electrophysiologist:   Clinic Note: No chief complaint on file.   HPI:    Alicia Pope is a 59 y.o. female who is being seen today for the 2 MONTH FOLLOW-UP evaluation of PALPITATIONS at the request of Delsa Grana, PA-C. - PCP noted  PACs on her EKG. -- started on Propranolol 10 mg TID - helping some.  -> Changed to Toprol 25 mg   Alicia Pope was recently seen on 02/20/2019 to evaluate these palpitations.  Noted that there is definitely improvement with switching to Toprol.  Was still having significant palpitations mostly in the evening and at night.  But will also sometimes occur during the day. --Event monitor ordered.  Recent Hospitalizations:  None  Reviewed  CV studies:    The following studies were reviewed today: (if available, images/films reviewed: From Epic Chart or Care Everywhere)  TTE 03/07/19: EF 55-60 % with normal wall motion.  Slight LA dilation with grade 1 diastolic function.  Moderate aortic valve calcification-sclerosis without stenosis.  Otherwise normal. . Event Monitor December 2020: No average rate 60 bpm.  Max HR 122, minimum HR 36 bpm-sinus bradycardia with PACs and bigeminy.  Frequent PACs seen as isolated, couplets, triplets as well as frequent bigeminy.:   Interval History:   Alicia Pope presents here today with still noticing that she is having these palpitations, she feels and currently well while she is being examined.  They have definitely decreased since being on the Toprol, but she has also noted some exertional dyspnea and chest tightness that has not had before.  She feels fatigue along with the exertional dyspnea.  She has not noted any syncope or near syncope or any dizziness/wooziness.  What she notices more is exercise tolerance.  She still describes that her palpitations as being forceful pounding relatively regular episodes of  heartbeats.  Not really fast heart rates.   CV Review of Symptoms (Summary)  positive for - chest pain, dyspnea on exertion, irregular heartbeat and Described as a regular pounding forceful heartbeat negative for - edema, orthopnea, paroxysmal nocturnal dyspnea, rapid heart rate, shortness of breath or Syncope/near syncope, TIA/amaurosis fugax; claudication  The patient DOES NOT have symptoms concerning for COVID-19 infection (fever, chills, cough, or new shortness of breath).  The patient is practicing social distancing and masking.  Very careful when she goes out groceries/shopping.    REVIEWED OF SYSTEMS   A comprehensive ROS was performed. Review of Systems  Constitutional: Negative for malaise/fatigue (Not sleeping well, but also exercise intolerance) and weight loss.  HENT: Negative for congestion.   Respiratory: Negative for shortness of breath (Only DOE.).   Cardiovascular: Negative for leg swelling.  Gastrointestinal: Negative for blood in stool and melena.  Genitourinary: Negative for hematuria.  Musculoskeletal: Negative for falls and joint pain.  Neurological: Negative for dizziness, weakness and headaches.  Psychiatric/Behavioral: Negative for depression (Not necessarily depression, but certainly has a depressed mood.) and memory loss. The patient is nervous/anxious and has insomnia.        Anxiety is driven by social stressors  All other systems reviewed and are negative.   I have reviewed and (if needed) personally updated the patient's problem list, medications, allergies, past medical and surgical history, social and family history.   PAST MEDICAL HISTORY   Past Medical History:  Diagnosis Date  . Allergy   . Anxiety 2013  . Asthma 2015  . Asthma   .  Depression 2013  . Diabetes mellitus 2004  . Finding of multiple premature atrial contractions by electrocardiography   . GERD (gastroesophageal reflux disease) 2013  . Hyperlipidemia 1992  . Hypertension 1992    . Memory impairment     PAST SURGICAL HISTORY   Past Surgical History:  Procedure Laterality Date  . ANTERIOR CRUCIATE LIGAMENT REPAIR  1997  . APPENDECTOMY  1987  . CARDIAC EVENT MONITOR  03/2019   No average rate 60 bpm.  Max HR 122, minimum HR 36 bpm-sinus bradycardia with PACs and bigeminy.  Frequent PACs seen as isolated, couplets, triplets as well as frequent bigeminy.Shirlyn Goltz SECTION     99,83,38,25  . CHOLECYSTECTOMY  1987  . TRANSTHORACIC ECHOCARDIOGRAM  03/2019   EF 55-60 % with normal wall motion.  Slight LA dilation with grade 1 diastolic function.  Moderate aortic valve calcification-sclerosis without stenosis.  Otherwise normal.  . uterine ablation       MEDICATIONS/ALLERGIES   Current Meds  Medication Sig  . aspirin 81 MG tablet Take 1 tablet (81 mg total) by mouth daily.  . blood glucose meter kit and supplies KIT Inject 1 each into the skin as directed. Dispense based on patient and insurance preference. Check blood sugar fasting each morning.  ICD E11.65  . Blood Glucose Monitoring Suppl (FREESTYLE LITE) DEVI UAD.E11.65  . chlorhexidine gluconate, MEDLINE KIT, (PERIDEX) 0.12 % solution Use as directed 15 mLs in the mouth or throat daily.  . [employer] D3 2000 units CAPS Take 2 capsules (4,000 Units total) by mouth daily.  Marland Kitchen FLUoxetine (PROZAC) 20 MG tablet TAKE 1 TABLET BY MOUTH EVERY DAY  . glucose blood test strip Dispense based on patient and insurance preference.  Check blood sugar fasting each morning.  ICD E11.65  . Insulin Glargine (LANTUS SOLOSTAR) 100 UNIT/ML Solostar Pen Inject 70 Units into the skin daily.  . insulin lispro (HUMALOG KWIKPEN) 100 UNIT/ML KwikPen Inject 0.1 mLs (10 Units total) into the skin 3 (three) times daily with meals. Inject 10 units under the skin three times daily before meals.  . Lancets MISC Dispense based on patient and insurance preference.  Check blood sugar fasting each morning.  ICD E11.65  . lisinopril-hydrochlorothiazide  (ZESTORETIC) 20-12.5 MG tablet Take 1 tablet by mouth daily.  Marland Kitchen LORazepam (ATIVAN) 1 MG tablet Take 0.5-1 tablets (0.5-1 mg total) by mouth every 8 (eight) hours as needed for anxiety.  . metFORMIN (GLUCOPHAGE-XR) 500 MG 24 hr tablet TAKE 1 TABLET BY MOUTH EVERY DAY WITH BREAKFAST  . metoprolol succinate (TOPROL-XL) 25 MG 24 hr tablet Take 1 tablet (25 mg total) by mouth daily.  . mirtazapine (REMERON) 30 MG tablet TAKE 1 TABLET BY MOUTH EVERYDAY AT BEDTIME  . pantoprazole (PROTONIX) 40 MG tablet Take 1 tablet (40 mg total) by mouth daily.  . rosuvastatin (CRESTOR) 40 MG tablet Take 1 tablet (40 mg total) by mouth daily.  . tizanidine (ZANAFLEX) 2 MG capsule Take 1 capsule (2 mg total) by mouth 3 (three) times daily as needed for muscle spasms.    Allergies  Allergen Reactions  . Contrast Media [Iodinated Diagnostic Agents] Anaphylaxis  . Ambien [Zolpidem Tartrate] Other (See Comments)    Family has told her that after taking Ambien, she does "crazy things" that she does not remember doing.   . Codeine Nausea And Vomiting    SOCIAL HISTORY/FAMILY HISTORY   Social History   Tobacco Use  . Smoking status: Never Smoker  . Smokeless tobacco: Never  Used  Substance Use Topics  . Alcohol use: No  . Drug use: No   Social History   Social History Narrative   Married.    Lives with husband and youngest son, who is 37 y/o.   Has 4 children.   Does not work outside of home.    No formal exercise. Only activity around the house.   Tries to be careful with Diabetic diet.    Family History family history includes Cancer in her father, maternal grandfather, and paternal grandmother; Depression in her father; Diabetes in her paternal grandfather; Kidney disease in her paternal grandfather.   OBJCTIVE -PE, EKG, labs   Wt Readings from Last 3 Encounters:  04/24/19 249 lb 3.2 oz (113 kg)  02/20/19 247 lb 12.8 oz (112.4 kg)  01/10/19 242 lb 12.8 oz (110.1 kg)    Physical Exam: BP  133/70   Pulse 62   Temp (!) 97.5 F (36.4 C)   Ht '5\' 2"'$  (1.575 m)   Wt 249 lb 3.2 oz (113 kg)   SpO2 99%   BMI 45.58 kg/m  Physical Exam  Constitutional: She is oriented to person, place, and time. She appears well-developed and well-nourished. No distress.  Morbidly obese.  Well-groomed  HENT:  Head: Normocephalic and atraumatic.  Neck: No hepatojugular reflux and no JVD present. Carotid bruit is not present. No thyromegaly present.  Cardiovascular: Normal rate, regular rhythm, S1 normal, S2 normal and intact distal pulses. Frequent extrasystoles are present. PMI is not displaced. Exam reveals distant heart sounds. Exam reveals no gallop and no friction rub.  Murmur (1-2/6 SEM at RUSB.) heard. Pulmonary/Chest: Effort normal and breath sounds normal. No respiratory distress. She has no wheezes. She has no rales.  Abdominal: Soft. Bowel sounds are normal. She exhibits no distension. There is no abdominal tenderness. There is no rebound.  Truncal obesity.  No HSM  Musculoskeletal:        General: Edema (Minimal pedal) present. Normal range of motion.     Cervical back: Normal range of motion and neck supple.  Lymphadenopathy:    She has cervical adenopathy (Shotty left-sided cervical adenopathy).  Neurological: She is alert and oriented to person, place, and time.  Skin: Skin is warm and dry.  Psychiatric: She has a normal mood and affect. Her behavior is normal. Judgment and thought content normal.  Vitals reviewed.   Adult ECG Report  Rate: 62;  Rhythm: normal sinus rhythm, premature atrial contractions (PAC) and Normal axis, intervals and durations.;   Narrative Interpretation: Slight differences compared to last EKG but relatively stable.  Recent Labs:    Lab Results  Component Value Date   CHOL 200 (H) 01/10/2019   HDL 56 01/10/2019   LDLCALC 112 (H) 01/10/2019   TRIG 200 (H) 01/10/2019   CHOLHDL 3.6 01/10/2019   Lab Results  Component Value Date   CREATININE 0.72  01/10/2019   BUN 11 01/10/2019   NA 139 01/10/2019   K 4.5 01/10/2019   CL 102 01/10/2019   CO2 30 01/10/2019    ASSESSMENT/PLAN    Problem List Items Addressed This Visit    Systolic ejection murmur (Chronic)    By echo most likely consistent with aortic sclerosis      Relevant Orders   EXERCISE TOLERANCE TEST (ETT)   PAC (premature atrial contraction) - Primary (Chronic)    Frequent PACs-19.4% on monitor.  This is on beta-blocker.  Quite symptomatic and I am worried about her exertional fatigue.  Would  exclude ischemia with GXT, but also need to consider the possibility of either antiarrhythmic agent versus even potential referral for ablation.  We will discuss electrophysiology.      Relevant Orders   EKG 12-Lead   EXERCISE TOLERANCE TEST (ETT)   Sinus bradycardia, persistent (Chronic)    Difficult to figure out the best option because she has sinus bradycardia but also some is bradycardia with bigeminy with significant PACs.  We will for now continue current dose of metoprolol, leery of pushing at higher.  Need to exclude chronotropic incompetence with her for exercise fatigue and dyspnea.  Plan: GXT (if abnormal, would probably consider coronary CTA)      Exertional dyspnea    Also associated with some occasional chest discomfort.  Need to exclude either chronotropic incompetence versus potential coronary schema.  Would like to do treadmill GXT for now, but if abnormal or concerning would potentially choose coronary CT angiogram.  Would prefer to avoid Myoview given body habitus.        Harlow is still having frequent episodes episodes of palpitations which by the monitor appeared to be just frequent PACs and notably in bigeminy.  Very difficult to determine the best way to treat this.  With her having exertional dyspnea and exercise intolerance we need to evaluate for any potential chronotropic incompetence while on beta-blocker.  I also do we will do a  baseline ischemic evaluation as we may want to consider antiarrhythmic agents.  For now we will continue current dose of beta-blocker and check GXT to evaluate for chronotropic competence and ischemia.  Pending ischemic evaluation results and evaluation chronotropic incompetence, will discuss treatment options with electrophysiology to determine if simply titrating up beta-blocker versus considering type I antiarrhythmic would be reasonable.  With 19% PACs, what I even potentially consider the possibility of referral for ablation.   COVID-19 Education: The signs and symptoms of COVID-19 were discussed with the patient and how to seek care for testing (follow up with PCP or arrange E-visit).   The importance of social distancing was discussed today.  I spent a total of 18 minutes with the patient in consultation and examination.  With total time was spent in direct patient consultation.  Time spent with chart review (studies, outside notes, etc): 10 Total Time: 28 total min   Current medicines are reviewed at length with the patient today.  (+/- concerns) n/a   Patient Instructions / Medication Changes & Studies & Tests Ordered   Patient Instructions  Medication Instructions:  No changes *If you need a refill on your cardiac medications before your next appointment, please call your pharmacy*  Lab Work: None ordered If you have labs (blood work) drawn today and your tests are completely normal, you will receive your results only by: Marland Kitchen MyChart Message (if you have MyChart) OR . A paper copy in the mail If you have any lab test that is abnormal or we need to change your treatment, we will call you to review the results.  Testing/Procedures: Your physician has requested that you have an exercise tolerance test. For further information please visit [email or web address]. Please also follow instruction sheet, as given. This will take place at [address], [address].  Do not drink  or eat foods with caffeine for 24 hours before the test. (Chocolate, coffee, tea, or energy drinks)  If you use an inhaler, bring it with you to the test.  Do not smoke for 4 hours before the test.  Wear comfortable  shoes and clothing.  Do not hold the Metoprolol the morning of the test    Follow-Up: At [facility], you and your health needs are our priority.  As part of our continuing mission to provide you with exceptional heart care, we have created designated Provider Care Teams.  These Care Teams include your primary Cardiologist (physician) and Advanced Practice Providers (APPs -  Physician Assistants and Nurse Practitioners) who all work together to provide you with the care you need, when you need it.  Your next appointment:   3 month(s)  The format for your next appointment:   In Person  Provider:   You may see Dr. Ellyn Hack or one of the following Advanced Practice Providers on your designated Care Team:    Rosaria Ferries, PA-C  Jory Sims, DNP, ANP  Cadence Kathlen Mody, NP       Studies Ordered:   Orders Placed This Encounter  Procedures  . EXERCISE TOLERANCE TEST (ETT)  . EKG 12-Lead     Glenetta Hew, M.D., M.S. Interventional Cardiologist   Pager # [phone] Phone # [phone] [address] [place], [place] [address]   Thank you for choosing Heartcare at [facility]!!

## 2019-04-24 NOTE — Assessment & Plan Note (Signed)
Also associated with some occasional chest discomfort.  Need to exclude either chronotropic incompetence versus potential coronary schema.  Would like to do treadmill GXT for now, but if abnormal or concerning would potentially choose coronary CT angiogram.  Would prefer to avoid Myoview given body habitus.

## 2019-04-24 NOTE — Patient Instructions (Addendum)
Medication Instructions:  No changes *If you need a refill on your cardiac medications before your next appointment, please call your pharmacy*  Lab Work: None ordered If you have labs (blood work) drawn today and your tests are completely normal, you will receive your results only by: Marland Kitchen MyChart Message (if you have MyChart) OR . A paper copy in the mail If you have any lab test that is abnormal or we need to change your treatment, we will call you to review the results.  Testing/Procedures: Your physician has requested that you have an exercise tolerance test. For further information please visit [email or web address] Please also follow instruction sheet, as given. This will take place at [address] [facility], [address].  Do not drink or eat foods with caffeine for 24 hours before the test. (Chocolate, coffee, tea, or energy drinks)  If you use an inhaler, bring it with you to the test.  Do not smoke for 4 hours before the test.  Wear comfortable shoes and clothing.  Do not hold the Metoprolol the morning of the test    Follow-Up: At [facility], you and your health needs are our priority.  As part of our continuing mission to provide you with exceptional heart care, we have created designated Provider Care Teams.  These Care Teams include your primary Cardiologist (physician) and Advanced Practice Providers (APPs -  Physician Assistants and Nurse Practitioners) who all work together to provide you with the care you need, when you need it.  Your next appointment:   3 month(s)  The format for your next appointment:   In Person  Provider:   You may see Dr. Herbie Baltimore or one of the following Advanced Practice Providers on your designated Care Team:    Theodore Demark, PA-C  Joni Reining, DNP, ANP  Cadence Fransico Michael, NP

## 2019-04-30 ENCOUNTER — Other Ambulatory Visit (HOSPITAL_COMMUNITY)
Admission: RE | Admit: 2019-04-30 | Discharge: 2019-04-30 | Disposition: A | Discharge status: Home / Self Care | Payer: 59 | Source: Ambulatory Visit | Attending: Cardiology | Admitting: Cardiology

## 2019-04-30 DIAGNOSIS — Z20822 Contact with and (suspected) exposure to covid-19: Secondary | ICD-10-CM | POA: Diagnosis not present

## 2019-04-30 DIAGNOSIS — Z01812 Encounter for preprocedural laboratory examination: Secondary | ICD-10-CM | POA: Diagnosis not present

## 2019-04-30 LAB — SARS CORONAVIRUS 2 (TAT 6-24 HRS): SARS Coronavirus 2: NEGATIVE

## 2019-05-01 ENCOUNTER — Telehealth (HOSPITAL_COMMUNITY): Payer: Self-pay

## 2019-05-01 NOTE — Telephone Encounter (Signed)
Encounter complete. 

## 2019-05-03 ENCOUNTER — Ambulatory Visit (HOSPITAL_COMMUNITY)
Admission: RE | Admit: 2019-05-03 | Discharge: 2019-05-03 | Disposition: A | Discharge status: Home / Self Care | Payer: 59 | Source: Ambulatory Visit | Attending: Internal Medicine | Admitting: Internal Medicine

## 2019-05-03 ENCOUNTER — Other Ambulatory Visit: Payer: Self-pay

## 2019-05-03 DIAGNOSIS — I491 Atrial premature depolarization: Secondary | ICD-10-CM | POA: Insufficient documentation

## 2019-05-03 DIAGNOSIS — R011 Cardiac murmur, unspecified: Secondary | ICD-10-CM | POA: Diagnosis not present

## 2019-05-03 LAB — EXERCISE TOLERANCE TEST
Estimated workload: 3.8 METS
Exercise duration (min): 1 min
Exercise duration (sec): 36 s
MPHR: 162 {beats}/min
Peak HR: 114 {beats}/min
Percent HR: 70 %
Rest HR: 63 {beats}/min

## 2019-05-07 ENCOUNTER — Other Ambulatory Visit: Payer: Self-pay

## 2019-05-07 HISTORY — PX: NM MYOVIEW LTD: HXRAD82

## 2019-05-08 ENCOUNTER — Other Ambulatory Visit: Payer: Self-pay

## 2019-05-09 ENCOUNTER — Encounter: Payer: Self-pay | Admitting: Endocrinology

## 2019-05-09 ENCOUNTER — Ambulatory Visit (INDEPENDENT_AMBULATORY_CARE_PROVIDER_SITE_OTHER): Payer: 59 | Admitting: Endocrinology

## 2019-05-09 ENCOUNTER — Other Ambulatory Visit: Payer: Self-pay

## 2019-05-09 VITALS — BP 132/70 | HR 70 | Ht 62.0 in | Wt 251.6 lb

## 2019-05-09 DIAGNOSIS — E119 Type 2 diabetes mellitus without complications: Secondary | ICD-10-CM

## 2019-05-09 DIAGNOSIS — E1165 Type 2 diabetes mellitus with hyperglycemia: Secondary | ICD-10-CM

## 2019-05-09 DIAGNOSIS — Z794 Long term (current) use of insulin: Secondary | ICD-10-CM | POA: Diagnosis not present

## 2019-05-09 LAB — POCT GLYCOSYLATED HEMOGLOBIN (HGB A1C): Hemoglobin A1C: 8.9 % — AB (ref 4.0–5.6)

## 2019-05-09 MED ORDER — LANTUS SOLOSTAR 100 UNIT/ML ~~LOC~~ SOPN: 60.0000 [IU] | PEN_INJECTOR | Freq: Every day | SUBCUTANEOUS | 6 refills | Status: DC

## 2019-05-09 MED ORDER — NOVOLOG FLEXPEN 100 UNIT/ML ~~LOC~~ SOPN: 20.0000 [IU] | PEN_INJECTOR | Freq: Three times a day (TID) | SUBCUTANEOUS | 11 refills | Status: DC

## 2019-05-09 NOTE — Patient Instructions (Addendum)
check your blood sugar twice a day.  vary the time of day when you check, between before the 3 meals, and at bedtime.  also check if you have symptoms of your blood sugar being too high or too low.  please keep a record of the readings and bring it to your next appointment here (or you can bring the meter itself).  You can write it on any piece of paper.  please call us sooner if your blood sugar goes below 70, or if you have a lot of readings over 200. On this type of insulin schedule, you should eat meals on a regular schedule.  If a meal is missed or significantly delayed, your blood sugar could go low.  Please reduce the Lantus to 60 units daily, and:  I have sent a prescription to your pharmacy, to increase the Novolog to 20 units 3 times a day (just before each meal).   Please come back for a follow-up appointment in 2 months.

## 2019-05-09 NOTE — Progress Notes (Signed)
Subjective:    [name], female    DOB: [birth year], 59 y.o.   MRN: [id number]  HPI Pt returns for f/u of diabetes mellitus: DM type: Insulin-requiring type 2.  Dx'ed: 0347 Complications: none Therapy: insulin since 2018 GDM: 1988 DKA: never Severe hypoglycemia: never Pancreatitis: never Pancreatic imaging: normal on 2013 CT Other: she did not tolerate Invokana (vaginitis); she stopped victoza, due to cost.   Interval history: Pt says cbg varies from 80-240.  She checks fasting only.  pt states she feels well in general.  Pt says she does not miss the insulin.   Past Medical History:  Diagnosis Date  . Allergy   . Anxiety 2013  . Asthma 2015  . Asthma   . Depression 2013  . Diabetes mellitus 2004  . Finding of multiple premature atrial contractions by electrocardiography   . GERD (gastroesophageal reflux disease) 2013  . Hyperlipidemia 1992  . Hypertension 1992  . Memory impairment     Past Surgical History:  Procedure Laterality Date  . ANTERIOR CRUCIATE LIGAMENT REPAIR  1997  . APPENDECTOMY  1987  . CARDIAC EVENT MONITOR  03/2019   No average rate 60 bpm.  Max HR 122, minimum HR 36 bpm-sinus bradycardia with PACs and bigeminy.  Frequent PACs seen as isolated, couplets, triplets as well as frequent bigeminy.Shirlyn Goltz SECTION     42,59,56,38  . CHOLECYSTECTOMY  1987  . TRANSTHORACIC ECHOCARDIOGRAM  03/2019   EF 55-60 % with normal wall motion.  Slight LA dilation with grade 1 diastolic function.  Moderate aortic valve calcification-sclerosis without stenosis.  Otherwise normal.  . uterine ablation      Social History   Socioeconomic History  . Marital status: Married    Spouse name: Not on file  . Number of children: Not on file  . Years of education: Not on file  . Highest education level: Not on file  Occupational History  . Not on file  Tobacco Use  . Smoking status: Never Smoker  . Smokeless tobacco: Never Used  Substance and  Sexual Activity  . Alcohol use: No  . Drug use: No  . Sexual activity: Yes  Other Topics Concern  . Not on file  Social History Narrative   Married.    Lives with husband and youngest son, who is 34 y/o.   Has 4 children.   Does not work outside of home.    No formal exercise. Only activity around the house.   Tries to be careful with Diabetic diet.   Social Determinants of Health   Financial Resource Strain:   . Difficulty of Paying Living Expenses: Not on file  Food Insecurity:   . Worried About Charity fundraiser in the Last Year: Not on file  . Ran Out of Food in the Last Year: Not on file  Transportation Needs:   . Lack of Transportation (Medical): Not on file  . Lack of Transportation (Non-Medical): Not on file  Physical Activity:   . Days of Exercise per Week: Not on file  . Minutes of Exercise per Session: Not on file  Stress:   . Feeling of Stress : Not on file  Social Connections:   . Frequency of Communication with Friends and Family: Not on file  . Frequency of Social Gatherings with Friends and Family: Not on file  . Attends Religious Services: Not on file  . Active Member of Clubs or Organizations: Not on file  .  Attends Archivist Meetings: Not on file  . Marital Status: Not on file  Intimate Partner Violence:   . Fear of Current or Ex-Partner: Not on file  . Emotionally Abused: Not on file  . Physically Abused: Not on file  . Sexually Abused: Not on file    Current Outpatient Medications on File Prior to Visit  Medication Sig Dispense Refill  . aspirin 81 MG tablet Take 1 tablet (81 mg total) by mouth daily. 30 tablet 11  . blood glucose meter kit and supplies KIT Inject 1 each into the skin as directed. Dispense based on patient and insurance preference. Check blood sugar fasting each morning.  ICD E11.65 1 each 0  . Blood Glucose Monitoring Suppl (FREESTYLE LITE) DEVI UAD.E11.65  0  . chlorhexidine gluconate, MEDLINE KIT, (PERIDEX) 0.12 %  solution Use as directed 15 mLs in the mouth or throat daily. 120 mL 0  . [facility] D3 2000 units CAPS Take 2 capsules (4,000 Units total) by mouth daily. 30 each 3  . FLUoxetine (PROZAC) 20 MG tablet TAKE 1 TABLET BY MOUTH EVERY DAY 90 tablet 2  . glucose blood test strip Dispense based on patient and insurance preference.  Check blood sugar fasting each morning.  ICD E11.65 50 each 12  . Lancets MISC Dispense based on patient and insurance preference.  Check blood sugar fasting each morning.  ICD E11.65 100 each 3  . lisinopril-hydrochlorothiazide (ZESTORETIC) 20-12.5 MG tablet Take 1 tablet by mouth daily. 90 tablet 3  . LORazepam (ATIVAN) 1 MG tablet Take 0.5-1 tablets (0.5-1 mg total) by mouth every 8 (eight) hours as needed for anxiety. 90 tablet 0  . metFORMIN (GLUCOPHAGE-XR) 500 MG 24 hr tablet TAKE 1 TABLET BY MOUTH EVERY DAY WITH BREAKFAST 90 tablet 3  . metoprolol succinate (TOPROL-XL) 25 MG 24 hr tablet Take 1 tablet (25 mg total) by mouth daily. 30 tablet 1  . mirtazapine (REMERON) 30 MG tablet TAKE 1 TABLET BY MOUTH EVERYDAY AT BEDTIME 90 tablet 0  . pantoprazole (PROTONIX) 40 MG tablet Take 1 tablet (40 mg total) by mouth daily. 30 tablet 3  . rosuvastatin (CRESTOR) 40 MG tablet Take 1 tablet (40 mg total) by mouth daily. 90 tablet 3  . tizanidine (ZANAFLEX) 2 MG capsule Take 1 capsule (2 mg total) by mouth 3 (three) times daily as needed for muscle spasms. 90 capsule 1   No current facility-administered medications on file prior to visit.    Allergies  Allergen Reactions  . Contrast Media [Iodinated Diagnostic Agents] Anaphylaxis  . Ambien [Zolpidem Tartrate] Other (See Comments)    Family has told her that after taking Ambien, she does "crazy things" that she does not remember doing.   . Codeine Nausea And Vomiting    Family History  Problem Relation Age of Onset  . Cancer Father        Lymphoma, Leukemia  . Depression Father   . Cancer Maternal Grandfather   . Cancer  Paternal Grandmother   . Diabetes Paternal Grandfather   . Kidney disease Paternal Grandfather     BP 132/70 (BP Location: Left Arm, Patient Position: Sitting, Cuff Size: Large)   Pulse 70   Ht '5\' 2"'$  (1.575 m)   Wt 251 lb 9.6 oz (114.1 kg)   SpO2 98%   BMI 46.02 kg/m    Review of Systems She denies hypoglycemia.      Objective:   Physical Exam VITAL SIGNS:  See vs page GENERAL: no  distress Pulses: dorsalis pedis intact bilat.   MSK: no deformity of the feet CV: trace bilat leg edema Skin:  no ulcer on the feet.  normal color and temp on the feet. Neuro: sensation is intact to touch on the feet.     Lab Results  Component Value Date   HGBA1C 8.9 (A) 05/09/2019       Assessment & Plan:  Insulin-requiring type 2 DM: worse  Patient Instructions  check your blood sugar twice a day.  vary the time of day when you check, between before the 3 meals, and at bedtime.  also check if you have symptoms of your blood sugar being too high or too low.  please keep a record of the readings and bring it to your next appointment here (or you can bring the meter itself).  You can write it on any piece of paper.  please call us sooner if your blood sugar goes below 70, or if you have a lot of readings over 200. On this type of insulin schedule, you should eat meals on a regular schedule.  If a meal is missed or significantly delayed, your blood sugar could go low.  Please reduce the Lantus to 60 units daily, and:  I have sent a prescription to your pharmacy, to increase the Novolog to 20 units 3 times a day (just before each meal).   Please come back for a follow-up appointment in 2 months.

## 2019-05-10 ENCOUNTER — Encounter: Payer: Self-pay | Admitting: Family Medicine

## 2019-05-10 ENCOUNTER — Other Ambulatory Visit: Payer: Self-pay

## 2019-05-10 DIAGNOSIS — Z794 Long term (current) use of insulin: Secondary | ICD-10-CM

## 2019-05-10 DIAGNOSIS — E119 Type 2 diabetes mellitus without complications: Secondary | ICD-10-CM

## 2019-05-10 MED ORDER — INSULIN LISPRO (1 UNIT DIAL) 100 UNIT/ML (KWIKPEN): 20.0000 [IU] | PEN_INJECTOR | Freq: Three times a day (TID) | SUBCUTANEOUS | 0 refills | Status: DC

## 2019-05-10 MED ORDER — PEN NEEDLES 30G X 8 MM MISC: 1.0000 | Freq: Four times a day (QID) | 1 refills | Status: DC

## 2019-05-11 ENCOUNTER — Telehealth: Payer: Self-pay | Admitting: *Deleted

## 2019-05-11 DIAGNOSIS — I491 Atrial premature depolarization: Secondary | ICD-10-CM

## 2019-05-11 DIAGNOSIS — R0609 Other forms of dyspnea: Secondary | ICD-10-CM

## 2019-05-11 DIAGNOSIS — R9431 Abnormal electrocardiogram [ECG] [EKG]: Secondary | ICD-10-CM

## 2019-05-11 NOTE — Telephone Encounter (Signed)
-----   Message from Marykay Lex, MD sent at 05/05/2019  8:56 PM EST ----- Treadmill stress test showed pretty significant exercise intolerance.  It also showed significant increase in PVCs with exercise. As a result of this finding, I think we do need to do additional testing.  Probably the most accurate ischemic evaluation would be a coronary CT angiogram.  Should get this done prior to me seeing her for follow-up.  Bryan Lemma, MD

## 2019-05-11 NOTE — Telephone Encounter (Signed)
RN  reviewed with Dr Herbie Baltimore. ( see result notes)  - Spoke to patient . Patient aware  Dr Herbie Baltimore would like to schedule lexiscan  instead of CCTA . Due patient allergies and  being claustrophobic.   Patient is in agreement  to have lexiscan  schedule

## 2019-05-12 ENCOUNTER — Other Ambulatory Visit: Payer: Self-pay | Admitting: Family Medicine

## 2019-05-12 DIAGNOSIS — F410 Panic disorder [episodic paroxysmal anxiety] without agoraphobia: Secondary | ICD-10-CM

## 2019-05-12 DIAGNOSIS — F411 Generalized anxiety disorder: Secondary | ICD-10-CM

## 2019-05-14 ENCOUNTER — Telehealth: Payer: Self-pay

## 2019-05-14 NOTE — Telephone Encounter (Signed)
Insurance does not cover [employer] prefers:  Humalog  Please advise if you would like to change RX or do PA.

## 2019-05-17 ENCOUNTER — Telehealth (HOSPITAL_COMMUNITY): Payer: Self-pay

## 2019-05-17 NOTE — Telephone Encounter (Signed)
Encounter complete. 

## 2019-05-23 ENCOUNTER — Other Ambulatory Visit: Payer: Self-pay

## 2019-05-23 ENCOUNTER — Ambulatory Visit (HOSPITAL_COMMUNITY)
Admission: RE | Admit: 2019-05-23 | Discharge: 2019-05-23 | Disposition: A | Discharge status: Home / Self Care | Payer: 59 | Source: Ambulatory Visit | Attending: Cardiology | Admitting: Cardiology

## 2019-05-23 DIAGNOSIS — R0600 Dyspnea, unspecified: Secondary | ICD-10-CM | POA: Insufficient documentation

## 2019-05-23 DIAGNOSIS — I491 Atrial premature depolarization: Secondary | ICD-10-CM | POA: Insufficient documentation

## 2019-05-23 DIAGNOSIS — R9431 Abnormal electrocardiogram [ECG] [EKG]: Secondary | ICD-10-CM | POA: Diagnosis not present

## 2019-05-23 DIAGNOSIS — R0609 Other forms of dyspnea: Secondary | ICD-10-CM

## 2019-05-23 MED ORDER — TECHNETIUM TC 99M TETROFOSMIN IV KIT
30.6000 | PACK | Freq: Once | INTRAVENOUS | Status: AC | PRN
  Administered 2019-05-23: 30.6 via INTRAVENOUS
  Filled 2019-05-23: qty 31

## 2019-05-23 MED ORDER — REGADENOSON 0.4 MG/5ML IV SOLN
0.4000 mg | Freq: Once | INTRAVENOUS | Status: AC
  Administered 2019-05-23: 0.4 mg via INTRAVENOUS

## 2019-05-24 ENCOUNTER — Ambulatory Visit (HOSPITAL_COMMUNITY): Admission: RE | Admit: 2019-05-24 | Payer: 59 | Source: Ambulatory Visit

## 2019-05-27 ENCOUNTER — Other Ambulatory Visit: Payer: Self-pay | Admitting: Family Medicine

## 2019-05-27 DIAGNOSIS — I491 Atrial premature depolarization: Secondary | ICD-10-CM

## 2019-05-27 DIAGNOSIS — R002 Palpitations: Secondary | ICD-10-CM

## 2019-05-28 ENCOUNTER — Other Ambulatory Visit: Payer: Self-pay | Admitting: Family Medicine

## 2019-05-28 ENCOUNTER — Telehealth: Payer: Self-pay | Admitting: Family Medicine

## 2019-05-28 DIAGNOSIS — I491 Atrial premature depolarization: Secondary | ICD-10-CM

## 2019-05-28 DIAGNOSIS — R002 Palpitations: Secondary | ICD-10-CM

## 2019-05-28 NOTE — Telephone Encounter (Signed)
REFUSED - she is seeing cardiology - they are delicately working on her palpitations and bradycardia - they changed her meds - she needs to contact them since they are managing and still doing cardiac testing.    -LT

## 2019-05-28 NOTE — Telephone Encounter (Signed)
Requested medications are due for refill today?  Yes  Requested medications are on active medication list?  Yes  Last Refill:   03/27/2019 # 30 with one refill  Future visit scheduled?  No  Notes to Clinic:  Patient undergoing cardiac testing.  Scheduled for cardiac test tomorrow.  Was supposed to follow up with Melton Alar, PA in one month from when metoprolol was initially prescribed.

## 2019-05-28 NOTE — Telephone Encounter (Signed)
Medication: metoprolol succinate (TOPROL-XL) 25 MG 24 hr tablet [[id number]]   Has the patient contacted their pharmacy? yes (Agent: If no, request that the patient contact the pharmacy for the refill.) (Agent: If yes, when and what did the pharmacy advise?)  Preferred Pharmacy (with phone number or street name): [id number] [place], [place] - [address]  Phone:  [phone] Fax:  [phone]

## 2019-05-29 ENCOUNTER — Other Ambulatory Visit: Payer: Self-pay

## 2019-05-29 ENCOUNTER — Ambulatory Visit (HOSPITAL_COMMUNITY)
Admission: RE | Admit: 2019-05-29 | Discharge: 2019-05-29 | Disposition: A | Discharge status: Home / Self Care | Payer: 59 | Source: Ambulatory Visit | Attending: Cardiology | Admitting: Cardiology

## 2019-05-29 LAB — MYOCARDIAL PERFUSION IMAGING
LV dias vol: 126 mL (ref 46–106)
LV sys vol: 50 mL
Peak HR: 76 {beats}/min
Rest HR: 57 {beats}/min
SDS: 3
SRS: 2
SSS: 4
TID: 1.16

## 2019-05-29 MED ORDER — TECHNETIUM TC 99M TETROFOSMIN IV KIT
30.9000 | PACK | Freq: Once | INTRAVENOUS | Status: AC | PRN
  Administered 2019-05-29: 30.9 via INTRAVENOUS

## 2019-05-30 ENCOUNTER — Other Ambulatory Visit: Payer: Self-pay | Admitting: Family Medicine

## 2019-05-30 DIAGNOSIS — R002 Palpitations: Secondary | ICD-10-CM

## 2019-05-30 DIAGNOSIS — I491 Atrial premature depolarization: Secondary | ICD-10-CM

## 2019-05-31 ENCOUNTER — Other Ambulatory Visit: Payer: Self-pay | Admitting: Family Medicine

## 2019-05-31 DIAGNOSIS — R002 Palpitations: Secondary | ICD-10-CM

## 2019-05-31 DIAGNOSIS — I491 Atrial premature depolarization: Secondary | ICD-10-CM

## 2019-05-31 NOTE — Telephone Encounter (Signed)
Pt last seen leisa in oct 2020. Pt has an appt schedule for 06-28-2019 and would like a refill on metoprolol. [facility] [address] in [place]

## 2019-06-01 NOTE — Telephone Encounter (Signed)
No refills  Per Cardiology who is managing her HR and palpitations and has changed her medicine and is doing cardiac tests

## 2019-06-05 ENCOUNTER — Other Ambulatory Visit: Payer: Self-pay | Admitting: Cardiology

## 2019-06-05 DIAGNOSIS — R002 Palpitations: Secondary | ICD-10-CM

## 2019-06-05 DIAGNOSIS — I491 Atrial premature depolarization: Secondary | ICD-10-CM

## 2019-06-05 MED ORDER — METOPROLOL SUCCINATE ER 25 MG PO TB24: 25.0000 mg | ORAL_TABLET | Freq: Every day | ORAL | 2 refills | Status: DC

## 2019-06-05 NOTE — Telephone Encounter (Signed)
*  STAT* If patient is at the pharmacy, call can be transferred to refill team.   1. Which medications need to be refilled? (please list name of each medication and dose if known) metoprolol succinate (TOPROL-XL) 25 MG 24 hr tablet  2. Which pharmacy/location (including street and city if local pharmacy) is medication to be sent to? [id number] [place], [place] - [address]  3. Do they need a 30 day or 90 day supply? 90 day   Patient is out of medication. She states he PCP will not refill the medication and that Dr. Herbie Baltimore has to.

## 2019-06-08 ENCOUNTER — Other Ambulatory Visit: Payer: Self-pay | Admitting: Endocrinology

## 2019-06-08 DIAGNOSIS — E1165 Type 2 diabetes mellitus with hyperglycemia: Secondary | ICD-10-CM

## 2019-06-28 ENCOUNTER — Other Ambulatory Visit: Payer: Self-pay

## 2019-06-28 ENCOUNTER — Telehealth: Payer: Self-pay

## 2019-06-28 ENCOUNTER — Encounter: Payer: Self-pay | Admitting: Family Medicine

## 2019-06-28 ENCOUNTER — Ambulatory Visit: Payer: 59 | Admitting: Family Medicine

## 2019-06-28 VITALS — BP 124/76 | HR 71 | Temp 98.5°F | Resp 14 | Ht 62.0 in | Wt 250.9 lb

## 2019-06-28 DIAGNOSIS — Z6841 Body Mass Index (BMI) 40.0 and over, adult: Secondary | ICD-10-CM

## 2019-06-28 DIAGNOSIS — G4700 Insomnia, unspecified: Secondary | ICD-10-CM

## 2019-06-28 DIAGNOSIS — Z1231 Encounter for screening mammogram for malignant neoplasm of breast: Secondary | ICD-10-CM | POA: Diagnosis not present

## 2019-06-28 DIAGNOSIS — R002 Palpitations: Secondary | ICD-10-CM

## 2019-06-28 DIAGNOSIS — Z794 Long term (current) use of insulin: Secondary | ICD-10-CM

## 2019-06-28 DIAGNOSIS — F410 Panic disorder [episodic paroxysmal anxiety] without agoraphobia: Secondary | ICD-10-CM

## 2019-06-28 DIAGNOSIS — R112 Nausea with vomiting, unspecified: Secondary | ICD-10-CM

## 2019-06-28 DIAGNOSIS — I491 Atrial premature depolarization: Secondary | ICD-10-CM

## 2019-06-28 DIAGNOSIS — Z5181 Encounter for therapeutic drug level monitoring: Secondary | ICD-10-CM

## 2019-06-28 DIAGNOSIS — F411 Generalized anxiety disorder: Secondary | ICD-10-CM

## 2019-06-28 DIAGNOSIS — E119 Type 2 diabetes mellitus without complications: Secondary | ICD-10-CM

## 2019-06-28 DIAGNOSIS — R001 Bradycardia, unspecified: Secondary | ICD-10-CM

## 2019-06-28 DIAGNOSIS — F430 Acute stress reaction: Secondary | ICD-10-CM

## 2019-06-28 DIAGNOSIS — E785 Hyperlipidemia, unspecified: Secondary | ICD-10-CM

## 2019-06-28 DIAGNOSIS — R011 Cardiac murmur, unspecified: Secondary | ICD-10-CM

## 2019-06-28 DIAGNOSIS — K219 Gastro-esophageal reflux disease without esophagitis: Secondary | ICD-10-CM

## 2019-06-28 LAB — COMPLETE METABOLIC PANEL WITH GFR
AG Ratio: 1.6 (calc) (ref 1.0–2.5)
ALT: 24 U/L (ref 6–29)
AST: 32 U/L (ref 10–35)
Albumin: 4 g/dL (ref 3.6–5.1)
Alkaline phosphatase (APISO): 63 U/L (ref 37–153)
BUN: 7 mg/dL (ref 7–25)
CO2: 26 mmol/L (ref 20–32)
Calcium: 9 mg/dL (ref 8.6–10.4)
Chloride: 106 mmol/L (ref 98–110)
Creat: 0.73 mg/dL (ref 0.50–1.05)
GFR, Est African American: 104 mL/min/{1.73_m2} (ref >=60)
GFR, Est Non African American: 90 mL/min/{1.73_m2} (ref >=60)
Globulin: 2.5 g/dL (calc) (ref 1.9–3.7)
Glucose, Bld: 159 mg/dL — ABNORMAL HIGH (ref 65–99)
Potassium: 4.1 mmol/L (ref 3.5–5.3)
Sodium: 140 mmol/L (ref 135–146)
Total Bilirubin: 0.6 mg/dL (ref 0.2–1.2)
Total Protein: 6.5 g/dL (ref 6.1–8.1)

## 2019-06-28 LAB — CBC WITH DIFFERENTIAL/PLATELET
Absolute Monocytes: 465 cells/uL (ref 200–950)
Basophils Absolute: 39 cells/uL (ref 0–200)
Basophils Relative: 0.7 %
Eosinophils Absolute: 218 cells/uL (ref 15–500)
Eosinophils Relative: 3.9 %
HCT: 44.2 % (ref 35.0–45.0)
Hemoglobin: 14.8 g/dL (ref 11.7–15.5)
Lymphs Abs: 2234 cells/uL (ref 850–3900)
MCH: 29.1 pg (ref 27.0–33.0)
MCHC: 33.5 g/dL (ref 32.0–36.0)
MCV: 87 fL (ref 80.0–100.0)
MPV: 10.1 fL (ref 7.5–12.5)
Monocytes Relative: 8.3 %
Neutro Abs: 2643 cells/uL (ref 1500–7800)
Neutrophils Relative %: 47.2 %
Platelets: 184 10*3/uL (ref 140–400)
RBC: 5.08 10*6/uL (ref 3.80–5.10)
RDW: 13.4 % (ref 11.0–15.0)
Total Lymphocyte: 39.9 %
WBC: 5.6 10*3/uL (ref 3.8–10.8)

## 2019-06-28 LAB — LIPID PANEL
Cholesterol: 171 mg/dL (ref <200)
HDL: 53 mg/dL (ref >=50)
LDL Cholesterol (Calc): 92 mg/dL (calc)
Non-HDL Cholesterol (Calc): 118 mg/dL (calc) (ref <130)
Total CHOL/HDL Ratio: 3.2 (calc) (ref <5.0)
Triglycerides: 166 mg/dL — ABNORMAL HIGH (ref <150)

## 2019-06-28 MED ORDER — LORAZEPAM 1 MG PO TABS: 0.5000 mg | ORAL_TABLET | Freq: Two times a day (BID) | ORAL | 0 refills | Status: DC | PRN

## 2019-06-28 MED ORDER — TRAZODONE HCL 50 MG PO TABS: 25.0000 mg | ORAL_TABLET | Freq: Every evening | ORAL | 5 refills | Status: DC | PRN

## 2019-06-28 MED ORDER — PANTOPRAZOLE SODIUM 40 MG PO TBEC: 40.0000 mg | DELAYED_RELEASE_TABLET | Freq: Every day | ORAL | 3 refills | Status: DC

## 2019-06-28 NOTE — Patient Instructions (Signed)
Stop remeron for insomnia  Try trazodone 50 - 100 mg at bedtime  You can still use ativan sparingly for severe anxiety or insomnia - we have discussed risks of the med, so minimize use as much as you can to avoid dependence, withdrawal etc.
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
## 2019-06-28 NOTE — Telephone Encounter (Signed)
Copied from CRM [id number]. Topic: General - Other >> Jun 28, 2019 12:17 PM Gwenlyn Fudge wrote: Reason for CRM: Pt called stating that her handicap placard was not marked as permanent or temporary. Pt is needing that info in order to receive her new sticker. Please advise.

## 2019-06-29 NOTE — Telephone Encounter (Signed)
What did you want her to have and we can circle?

## 2019-07-04 ENCOUNTER — Other Ambulatory Visit: Payer: Self-pay | Admitting: Endocrinology

## 2019-07-04 DIAGNOSIS — E1165 Type 2 diabetes mellitus with hyperglycemia: Secondary | ICD-10-CM

## 2019-07-09 ENCOUNTER — Ambulatory Visit: Payer: 59

## 2019-07-11 ENCOUNTER — Ambulatory Visit: Payer: 59 | Admitting: Endocrinology

## 2019-07-16 ENCOUNTER — Encounter: Payer: Self-pay | Admitting: Cardiology

## 2019-07-16 ENCOUNTER — Ambulatory Visit: Payer: 59 | Admitting: Cardiology

## 2019-07-16 ENCOUNTER — Other Ambulatory Visit: Payer: Self-pay

## 2019-07-16 VITALS — BP 120/76 | HR 62 | Temp 97.3°F | Ht 62.0 in | Wt 240.0 lb

## 2019-07-16 DIAGNOSIS — Z794 Long term (current) use of insulin: Secondary | ICD-10-CM

## 2019-07-16 DIAGNOSIS — I10 Essential (primary) hypertension: Secondary | ICD-10-CM | POA: Diagnosis not present

## 2019-07-16 DIAGNOSIS — I491 Atrial premature depolarization: Secondary | ICD-10-CM

## 2019-07-16 DIAGNOSIS — E782 Mixed hyperlipidemia: Secondary | ICD-10-CM | POA: Diagnosis not present

## 2019-07-16 DIAGNOSIS — R011 Cardiac murmur, unspecified: Secondary | ICD-10-CM

## 2019-07-16 DIAGNOSIS — E119 Type 2 diabetes mellitus without complications: Secondary | ICD-10-CM

## 2019-07-16 NOTE — Patient Instructions (Signed)
Medication Instructions:  NO CHANGES  *If you need a refill on your cardiac medications before your next appointment, please call your pharmacy*   Lab Work: NOT NEEDED   Testing/Procedures: NOT NEEDED   Follow-Up: At [facility], you and your health needs are our priority.  As part of our continuing mission to provide you with exceptional heart care, we have created designated Provider Care Teams.  These Care Teams include your primary Cardiologist (physician) and Advanced Practice Providers (APPs -  Physician Assistants and Nurse Practitioners) who all work together to provide you with the care you need, when you need it.   Your next appointment:   12 month(s)  The format for your next appointment:   In Person  Provider:   David Harding, MD   Other Instructions N/A 

## 2019-07-16 NOTE — Progress Notes (Signed)
Primary Care Provider: Delsa Grana, PA-C Cardiologist: Glenetta Hew, MD Electrophysiologist:   Clinic Note: Chief Complaint  Patient presents with  . Follow-up    Stress test results  . Chest Pain    Doing much better.  Very rare  . Palpitations    Fairly well controlled with beta-blocker    HPI:    Alicia Pope is a 59 y.o. female who is being seen today for the 2 MONTH FOLLOW-UP evaluation of PALPITATIONS at the request of Delsa Grana, PA-C. - PCP noted  PACs on her EKG. -- started on Propranolol 10 mg TID - helping some.  -> Changed to Toprol 25 mg    Initial evaluation: 02/20/2019 to evaluate these palpitations.  Noted that there is definitely improvement with switching to Toprol.  Was still having significant palpitations mostly in the evening and at night.  Event monitor showed nighttime shocks bradycardia with frequent PACs either isolated, couplets or triplets and frequent bigeminy.  Echo essentially normal.  DORLIS JUDICE was recently on April 24, 2019 still noting palpitations, notably reduced with taking Toprol.  At this time, she noted some exertional dyspnea and chest tightness as well as fatigue.  She described pounding forceful heartbeats but not necessarily fast. --> Treadmill stress test ordered to evaluate dyspnea fatigue but also for ectopy response to exercise. -> Was not able to walk more than 2 minutes on the treadmill because of back pain, converted to Myoview.  Recent Hospitalizations:  None  Reviewed  CV studies:    The following studies were reviewed today: (if available, images/films reviewed: From Epic Chart or Care Everywhere)  (05/03/2019) GXT: Only able to walk 1:36 min (2/2 back pain) - peak HR only 114 (70% MPHR) - 3.8 METS only --severely reduced exercise tolerance with no EKG changes.  Marked increase in ventricular ectopy noted during exercise resolving with rest. ->  Recommend imaging stress test  Patient asked to opt for a  non-CT test because of claustrophobia.  (05/23/2019) Lexiscan Myoview: Normal EF 60%.  Poor control hypertension.  (194/101 mmHg).  Small size moderate severity fixed defect in the mid anterior and apical anterior wall likely representing breast attenuation artifact.  No evidence of ischemia (reversibility).  LOW RISK   Interval History:   Alicia Pope presents here today noting that she actually is feeling a lot better.  She says her palpitations are pretty well controlled, but she knows if she misses a dose of her Toprol.  She does note the palpitations mostly at night but not necessarily increasing with activity level.  Again is more forceful beats then fast or irregular heartbeats.  (This would indicate that she is feeling probably isolated ectopy or bigeminy/trigeminy.) She was quick to point out, that her back was hurting of the day that she had the treadmill stress test and that was the reason why she was not up and walk very far.  That is probably also the reason why her blood pressure was high.  She says CCS off-and-on chest pain usually in the evening and it really only happens every now and then.  There is no rhyme or reason, or any association with any particular activity.  Does not usually happen with exercise, but is random.  CV Review of Symptoms (Summary)  positive for - Still having occasional palpitation but much improved with beta-blocker.  Off and on chest pain with about exertion.  Short-lived pinching sensation negative for - dyspnea on exertion, edema, orthopnea, paroxysmal nocturnal  dyspnea, rapid heart rate, shortness of breath or Syncope/near syncope, TIA/amaurosis fugax, claudication  The patient DOES NOT have symptoms concerning for COVID-19 infection (fever, chills, cough, or new shortness of breath).  The patient is practicing social distancing and masking.  Very careful when she goes out groceries/shopping.  --> She has not yet opted to get her COVID-19 shot.  She  indicates that she would like to "wait until she sees how her body else does.   REVIEWED OF SYSTEMS   A comprehensive ROS was performed. Review of Systems  Constitutional: Positive for weight loss (She has tried to adjust her diet and is trying to walk more.  She thinks she is lost some weight (10 pounds per our scales)). Negative for malaise/fatigue (Still not getting restful sleep.  She gets tired with exercise, but is notably deconditioned).  HENT: Negative for congestion and nosebleeds.   Respiratory: Negative for shortness of breath (Only DOE.).   Gastrointestinal: Negative for blood in stool and melena.  Genitourinary: Negative for hematuria.  Musculoskeletal: Positive for back pain. Negative for falls and joint pain.  Neurological: Negative for dizziness, weakness and headaches.  Psychiatric/Behavioral: Negative for depression (Not necessarily depression, but certainly has a depressed mood.) and memory loss. The patient is nervous/anxious and has insomnia (Has a hard time getting to sleep).        Anxiety is driven by social stressors  All other systems reviewed and are negative.   I have reviewed and (if needed) personally updated the patient's problem list, medications, allergies, past medical and surgical history, social and family history.   PAST MEDICAL HISTORY   Past Medical History:  Diagnosis Date  . Allergy   . Anxiety 2013  . Asthma 2015  . Asthma   . Depression 2013  . Diabetes mellitus 2004  . Finding of multiple premature atrial contractions by electrocardiography   . GERD (gastroesophageal reflux disease) 2013  . Hyperlipidemia 1992  . Hypertension 1992  . Memory impairment     PAST SURGICAL HISTORY   Past Surgical History:  Procedure Laterality Date  . ANTERIOR CRUCIATE LIGAMENT REPAIR  1997  . APPENDECTOMY  1987  . CARDIAC EVENT MONITOR  03/2019   No average rate 60 bpm.  Max HR 122, minimum HR 36 bpm-sinus bradycardia with PACs and bigeminy.   Frequent PACs seen as isolated, couplets, triplets as well as frequent bigeminy.Shirlyn Goltz SECTION     65,68,12,75  . CHOLECYSTECTOMY  1987  . NM MYOVIEW LTD  05/2019   Normal EF 60%.  Poor control hypertension.  (194/101 mmHg).  Small size moderate severity fixed defect in the mid anterior and apical anterior wall likely representing breast attenuation artifact.  No evidence of ischemia (reversibility).  LOW RISK  . TRANSTHORACIC ECHOCARDIOGRAM  03/2019   EF 55-60 % with normal wall motion.  Slight LA dilation with grade 1 diastolic function.  Moderate aortic valve calcification-sclerosis without stenosis.  Otherwise normal.  . uterine ablation      MEDICATIONS/ALLERGIES   Current Meds  Medication Sig  . aspirin 81 MG tablet Take 1 tablet (81 mg total) by mouth daily.  . blood glucose meter kit and supplies KIT Inject 1 each into the skin as directed. Dispense based on patient and insurance preference. Check blood sugar fasting each morning.  ICD E11.65  . Blood Glucose Monitoring Suppl (FREESTYLE LITE) DEVI UAD.E11.65  . chlorhexidine gluconate, MEDLINE KIT, (PERIDEX) 0.12 % solution Use as directed 15 mLs in the mouth  or throat daily.  . [employer] D3 2000 units CAPS Take 2 capsules (4,000 Units total) by mouth daily.  Marland Kitchen FLUoxetine (PROZAC) 20 MG tablet TAKE 1 TABLET BY MOUTH EVERY DAY  . glucose blood test strip Dispense based on patient and insurance preference.  Check blood sugar fasting each morning.  ICD E11.65  . insulin lispro (HUMALOG KWIKPEN) 100 UNIT/ML KwikPen Inject 0.2 mLs (20 Units total) into the skin 3 (three) times daily.  . Insulin Pen Needle (PEN NEEDLES) 30G X 8 MM MISC 1 each by Does not apply route 4 (four) times daily. E11.9  . Lancets MISC Dispense based on patient and insurance preference.  Check blood sugar fasting each morning.  ICD E11.65  . LANTUS SOLOSTAR 100 UNIT/ML Solostar Pen INJECT 100 UNITS INTO THE SKIN DAILY  . lisinopril-hydrochlorothiazide (ZESTORETIC)  20-12.5 MG tablet Take 1 tablet by mouth daily.  Marland Kitchen LORazepam (ATIVAN) 1 MG tablet Take 0.5-1 tablets (0.5-1 mg total) by mouth 2 (two) times daily as needed for anxiety or sleep (use sparingly).  . metFORMIN (GLUCOPHAGE-XR) 500 MG 24 hr tablet TAKE 1 TABLET BY MOUTH EVERY DAY WITH BREAKFAST  . metoprolol succinate (TOPROL-XL) 25 MG 24 hr tablet Take 1 tablet (25 mg total) by mouth daily.  . pantoprazole (PROTONIX) 40 MG tablet Take 1 tablet (40 mg total) by mouth daily.  . rosuvastatin (CRESTOR) 40 MG tablet Take 1 tablet (40 mg total) by mouth daily.  . tizanidine (ZANAFLEX) 2 MG capsule Take 1 capsule (2 mg total) by mouth 3 (three) times daily as needed for muscle spasms.  Marland Kitchen tiZANidine (ZANAFLEX) 4 MG tablet SMARTSIG:3 Tablet(s) By Mouth Every Other Day PRN  . traZODone (DESYREL) 50 MG tablet Take 0.5-2 tablets (25-100 mg total) by mouth at bedtime as needed for sleep.    Allergies  Allergen Reactions  . Contrast Media [Iodinated Diagnostic Agents] Anaphylaxis  . Ambien [Zolpidem Tartrate] Other (See Comments)    Family has told her that after taking Ambien, she does "crazy things" that she does not remember doing.   . Codeine Nausea And Vomiting    SOCIAL HISTORY/FAMILY HISTORY   Social History   Tobacco Use  . Smoking status: Never Smoker  . Smokeless tobacco: Never Used  Substance Use Topics  . Alcohol use: No  . Drug use: No   Social History   Social History Narrative   Married.    Lives with husband and youngest son, who is 56 y/o.   Has 4 children.   Does not work outside of home.    No formal exercise. Only activity around the house.   Tries to be careful with Diabetic diet.    Family History family history includes Cancer in her father, maternal grandfather, and paternal grandmother; Depression in her father; Diabetes in her paternal grandfather; Kidney disease in her paternal grandfather.   OBJCTIVE -PE, EKG, labs   Wt Readings from Last 3 Encounters:    07/16/19 240 lb (108.9 kg)  06/28/19 250 lb 14.4 oz (113.8 kg)  05/23/19 251 lb (113.9 kg)    Physical Exam: BP 120/76   Pulse 62   Temp (!) 97.3 F (36.3 C)   Ht '5\' 2"'$  (1.575 m)   Wt 240 lb (108.9 kg)   SpO2 98%   BMI 43.90 kg/m  Physical Exam  Constitutional: She is oriented to person, place, and time. She appears well-developed and well-nourished. No distress.  Morbidly obese.  Well-groomed  HENT:  Head: Normocephalic and atraumatic.  Neck: No hepatojugular reflux and no JVD present. Carotid bruit is not present. No thyromegaly present.  Cardiovascular: Normal rate, regular rhythm, S1 normal, S2 normal and intact distal pulses.  Occasional extrasystoles are present. PMI is not displaced. Exam reveals distant heart sounds. Exam reveals no gallop and no friction rub.  Murmur (1/6 SEM at RUSB.  No radiation) heard. Pulmonary/Chest: Effort normal and breath sounds normal. No respiratory distress. She has no wheezes. She has no rales.  Abdominal:  Truncal obesity.  No HSM  Musculoskeletal:        General: No edema (Trivial pedal swelling). Normal range of motion.     Cervical back: Normal range of motion and neck supple.  Neurological: She is alert and oriented to person, place, and time.  Psychiatric: She has a normal mood and affect. Her behavior is normal. Judgment and thought content normal.  Vitals reviewed.   Adult ECG Report N/a  Recent Labs:     Lab Results  Component Value Date   HGBA1C 8.9 (A) 05/09/2019   Lab Results  Component Value Date   CHOL 171 06/28/2019   HDL 53 06/28/2019   LDLCALC 92 06/28/2019   TRIG 166 (H) 06/28/2019   CHOLHDL 3.2 06/28/2019   Lab Results  Component Value Date   CREATININE 0.73 06/28/2019   BUN 7 06/28/2019   NA 140 06/28/2019   K 4.1 06/28/2019   CL 106 06/28/2019   CO2 26 06/28/2019    ASSESSMENT/PLAN    Problem List Items Addressed This Visit    Essential hypertension (Chronic)    Blood pressure looks good  today.  She is taking her metoprolol as well as lisinopril and HCTZ.  HCTZ is helping swelling      Diabetes mellitus, type II, insulin dependent (HCC) (Chronic)    Managed by endocrinology.  Improving, but still not at goal.      Hyperlipidemia (Chronic)    On 40 mg Crestor.  LDL is 92. As a diabetic with hypertension, target LDL should be less than 100 with a goal of less than 70.  Continue for now.  Hopefully with weight loss and glycemic control her lipids will improve.  Combination of elevated triglycerides, obesity and diabetes plus hypertension equilibrates to metabolic syndrome. -> Consider coronary calcium scoring follow-up visits to establish baseline cardiovascular risk as she currently has a negative stress test      Systolic ejection murmur (Chronic)    Both by exam and by echo, probably consistent with aortic sclerosis.  No evidence of stenosis.      PAC (premature atrial contraction) - Primary (Chronic)    Significant PACs noted on monitor however GXT suggested ventricular ectopy.  I wonder if this was a variant conduction.  Overall, symptoms are notably improved with current dose of Toprol.  Not requiring additional doses.  Provided symptoms remain pain stability, would hold off on antiarrhythmic agent besides beta-blocker.        Jazminn is still having frequent episodes episodes of palpitations which by the monitor appeared to be just frequent PACs and notably in bigeminy.  Very difficult to determine the best way to treat this.  With her having exertional dyspnea and exercise intolerance we need to evaluate for any potential chronotropic incompetence while on beta-blocker.  I also do we will do a baseline ischemic evaluation as we may want to consider antiarrhythmic agents.  For now we will continue current dose of beta-blocker and check GXT to evaluate for chronotropic competence and  ischemia.  Pending ischemic evaluation results and evaluation chronotropic  incompetence, will discuss treatment options with electrophysiology to determine if simply titrating up beta-blocker versus considering type I antiarrhythmic would be reasonable.  With 19% PACs, what I even potentially consider the possibility of referral for ablation.   COVID-19 Education: The signs and symptoms of COVID-19 were discussed with the patient and how to seek care for testing (follow up with PCP or arrange E-visit).   The importance of social distancing was discussed today.  I spent a total of 18 minutes with the patient in consultation and examination.  With total time was spent in direct patient consultation.  Time spent with chart review (studies, outside notes, etc): 10 Total Time: 28 total min   Current medicines are reviewed at length with the patient today.  (+/- concerns) n/a   Patient Instructions / Medication Changes & Studies & Tests Ordered   Patient Instructions  Medication Instructions:  NO CHANGES *If you need a refill on your cardiac medications before your next appointment, please call your pharmacy*   Lab Work: NOT NEEDED   Testing/Procedures: NOT NEEDED   Follow-Up: At [facility], you and your health needs are our priority.  As part of our continuing mission to provide you with exceptional heart care, we have created designated Provider Care Teams.  These Care Teams include your primary Cardiologist (physician) and Advanced Practice Providers (APPs -  Physician Assistants and Nurse Practitioners) who all work together to provide you with the care you need, when you need it.    Your next appointment:   12 month(s)  The format for your next appointment:   In Person  Provider:   Glenetta Hew, MD   Other Instructions N/A    Studies Ordered:   No orders of the defined types were placed in this encounter.    Glenetta Hew, M.D., M.S. Interventional Cardiologist   Pager # [phone] Phone # [phone] [address] [place], [place] [address]   Thank you for choosing [facility] at [facility]!!

## 2019-07-20 ENCOUNTER — Other Ambulatory Visit: Payer: Self-pay

## 2019-07-20 ENCOUNTER — Other Ambulatory Visit: Payer: Self-pay | Admitting: Family Medicine

## 2019-07-20 ENCOUNTER — Encounter: Payer: Self-pay | Admitting: Cardiology

## 2019-07-20 ENCOUNTER — Encounter: Payer: Self-pay | Admitting: Endocrinology

## 2019-07-20 ENCOUNTER — Ambulatory Visit: Payer: 59 | Admitting: Endocrinology

## 2019-07-20 VITALS — BP 120/80 | HR 65 | Ht 62.0 in | Wt 243.0 lb

## 2019-07-20 DIAGNOSIS — E119 Type 2 diabetes mellitus without complications: Secondary | ICD-10-CM | POA: Diagnosis not present

## 2019-07-20 DIAGNOSIS — E1165 Type 2 diabetes mellitus with hyperglycemia: Secondary | ICD-10-CM

## 2019-07-20 DIAGNOSIS — G4700 Insomnia, unspecified: Secondary | ICD-10-CM

## 2019-07-20 DIAGNOSIS — Z794 Long term (current) use of insulin: Secondary | ICD-10-CM

## 2019-07-20 LAB — POCT GLYCOSYLATED HEMOGLOBIN (HGB A1C): Hemoglobin A1C: 9.1 % — AB (ref 4.0–5.6)

## 2019-07-20 MED ORDER — INSULIN LISPRO (1 UNIT DIAL) 100 UNIT/ML (KWIKPEN): 30.0000 [IU] | PEN_INJECTOR | Freq: Three times a day (TID) | SUBCUTANEOUS | 0 refills | Status: DC

## 2019-07-20 MED ORDER — LANTUS SOLOSTAR 100 UNIT/ML ~~LOC~~ SOPN: 90.0000 [IU] | PEN_INJECTOR | Freq: Every day | SUBCUTANEOUS | 3 refills | Status: DC

## 2019-07-20 NOTE — Assessment & Plan Note (Signed)
Blood pressure looks good today.  She is taking her metoprolol as well as lisinopril and HCTZ.  HCTZ is helping swelling

## 2019-07-20 NOTE — Patient Instructions (Addendum)
check your blood sugar twice a day.  vary the time of day when you check, between before the 3 meals, and at bedtime.  also check if you have symptoms of your blood sugar being too high or too low.  please keep a record of the readings and bring it to your next appointment here (or you can bring the meter itself).  You can write it on any piece of paper.  please call us sooner if your blood sugar goes below 70, or if you have a lot of readings over 200. On this type of insulin schedule, you should eat meals on a regular schedule.  If a meal is missed or significantly delayed, your blood sugar could go low.  Please reduce the Lantus to 90 units daily, and:  increase the Novolog to 30 units 3 times a day (just before each meal).   Please come back for a follow-up appointment in 2 months.

## 2019-07-20 NOTE — Progress Notes (Signed)
Subjective:    [name], female    DOB: 11-14-1960, 59 y.o.   MRN: [id number]  HPI Pt returns for f/u of diabetes mellitus: DM type: Insulin-requiring type 2.  Dx'ed: 8295 Complications: none Therapy: insulin since 2018 GDM: 1988 DKA: never Severe hypoglycemia: never Pancreatitis: never Pancreatic imaging: normal on 2013 CT Other: she did not tolerate Invokana (vaginitis); she stopped victoza, due to cost; she takes multiple daily injections Interval history: no cbg record, but she says cbg varies from 90-275.  She checks fasting only.  pt states she feels well in general.  Pt says she does not miss the insulin.   Past Medical History:  Diagnosis Date  . Allergy   . Anxiety 2013  . Asthma 2015  . Asthma   . Depression 2013  . Diabetes mellitus 2004  . Finding of multiple premature atrial contractions by electrocardiography   . GERD (gastroesophageal reflux disease) 2013  . Hyperlipidemia 1992  . Hypertension 1992  . Memory impairment     Past Surgical History:  Procedure Laterality Date  . ANTERIOR CRUCIATE LIGAMENT REPAIR  1997  . APPENDECTOMY  1987  . CARDIAC EVENT MONITOR  03/2019   No average rate 60 bpm.  Max HR 122, minimum HR 36 bpm-sinus bradycardia with PACs and bigeminy.  Frequent PACs seen as isolated, couplets, triplets as well as frequent bigeminy.Shirlyn Goltz SECTION     62,13,08,65  . CHOLECYSTECTOMY  1987  . NM MYOVIEW LTD  05/2019   Normal EF 60%.  Poor control hypertension.  (194/101 mmHg).  Small size moderate severity fixed defect in the mid anterior and apical anterior wall likely representing breast attenuation artifact.  No evidence of ischemia (reversibility).  LOW RISK  . TRANSTHORACIC ECHOCARDIOGRAM  03/2019   EF 55-60 % with normal wall motion.  Slight LA dilation with grade 1 diastolic function.  Moderate aortic valve calcification-sclerosis without stenosis.  Otherwise normal.  . uterine ablation      Social History    Socioeconomic History  . Marital status: Married    Spouse name: Not on file  . Number of children: Not on file  . Years of education: Not on file  . Highest education level: Not on file  Occupational History  . Not on file  Tobacco Use  . Smoking status: Never Smoker  . Smokeless tobacco: Never Used  Substance and Sexual Activity  . Alcohol use: No  . Drug use: No  . Sexual activity: Yes  Other Topics Concern  . Not on file  Social History Narrative   Married.    Lives with husband and youngest son, who is 76 y/o.   Has 4 children.   Does not work outside of home.    No formal exercise. Only activity around the house.   Tries to be careful with Diabetic diet.   Social Determinants of Health   Financial Resource Strain:   . Difficulty of Paying Living Expenses:   Food Insecurity:   . Worried About Charity fundraiser in the Last Year:   . Arboriculturist in the Last Year:   Transportation Needs:   . Film/video editor (Medical):   Marland Kitchen Lack of Transportation (Non-Medical):   Physical Activity:   . Days of Exercise per Week:   . Minutes of Exercise per Session:   Stress:   . Feeling of Stress :   Social Connections:   . Frequency of Communication with Friends  and Family:   . Frequency of Social Gatherings with Friends and Family:   . Attends Religious Services:   . Active Member of Clubs or Organizations:   . Attends Archivist Meetings:   Marland Kitchen Marital Status:   Intimate Partner Violence:   . Fear of Current or Ex-Partner:   . Emotionally Abused:   Marland Kitchen Physically Abused:   . Sexually Abused:     Current Outpatient Medications on File Prior to Visit  Medication Sig Dispense Refill  . aspirin 81 MG tablet Take 1 tablet (81 mg total) by mouth daily. 30 tablet 11  . blood glucose meter kit and supplies KIT Inject 1 each into the skin as directed. Dispense based on patient and insurance preference. Check blood sugar fasting each morning.  ICD E11.65 1 each  0  . Blood Glucose Monitoring Suppl (FREESTYLE LITE) DEVI UAD.E11.65  0  . chlorhexidine gluconate, MEDLINE KIT, (PERIDEX) 0.12 % solution Use as directed 15 mLs in the mouth or throat daily. 120 mL 0  . [facility] D3 2000 units CAPS Take 2 capsules (4,000 Units total) by mouth daily. 30 each 3  . FLUoxetine (PROZAC) 20 MG tablet TAKE 1 TABLET BY MOUTH EVERY DAY 90 tablet 2  . glucose blood test strip Dispense based on patient and insurance preference.  Check blood sugar fasting each morning.  ICD E11.65 50 each 12  . Insulin Pen Needle (PEN NEEDLES) 30G X 8 MM MISC 1 each by Does not apply route 4 (four) times daily. E11.9 200 each 1  . Lancets MISC Dispense based on patient and insurance preference.  Check blood sugar fasting each morning.  ICD E11.65 100 each 3  . lisinopril-hydrochlorothiazide (ZESTORETIC) 20-12.5 MG tablet Take 1 tablet by mouth daily. 90 tablet 3  . LORazepam (ATIVAN) 1 MG tablet Take 0.5-1 tablets (0.5-1 mg total) by mouth 2 (two) times daily as needed for anxiety or sleep (use sparingly). 60 tablet 0  . metFORMIN (GLUCOPHAGE-XR) 500 MG 24 hr tablet TAKE 1 TABLET BY MOUTH EVERY DAY WITH BREAKFAST 90 tablet 3  . metoprolol succinate (TOPROL-XL) 25 MG 24 hr tablet Take 1 tablet (25 mg total) by mouth daily. 90 tablet 2  . pantoprazole (PROTONIX) 40 MG tablet Take 1 tablet (40 mg total) by mouth daily. 90 tablet 3  . rosuvastatin (CRESTOR) 40 MG tablet Take 1 tablet (40 mg total) by mouth daily. 90 tablet 3  . tizanidine (ZANAFLEX) 2 MG capsule Take 1 capsule (2 mg total) by mouth 3 (three) times daily as needed for muscle spasms. 90 capsule 1  . tiZANidine (ZANAFLEX) 4 MG tablet SMARTSIG:3 Tablet(s) By Mouth Every Other Day PRN    . traZODone (DESYREL) 50 MG tablet Take 0.5-2 tablets (25-100 mg total) by mouth at bedtime as needed for sleep. 60 tablet 5   No current facility-administered medications on file prior to visit.    Allergies  Allergen Reactions  . Contrast Media  [Iodinated Diagnostic Agents] Anaphylaxis  . Ambien [Zolpidem Tartrate] Other (See Comments)    Family has told her that after taking Ambien, she does "crazy things" that she does not remember doing.   . Codeine Nausea And Vomiting    Family History  Problem Relation Age of Onset  . Cancer Father        Lymphoma, Leukemia  . Depression Father   . Cancer Maternal Grandfather   . Cancer Paternal Grandmother   . Diabetes Paternal Grandfather   . Kidney disease Paternal  Grandfather     BP 120/80   Pulse 65   Ht '5\' 2"'$  (1.575 m)   Wt 243 lb (110.2 kg)   SpO2 99%   BMI 44.45 kg/m    Review of Systems She denies hypoglycemia.      Objective:   Physical Exam VITAL SIGNS:  See vs page GENERAL: no distress Pulses: dorsalis pedis intact bilat.   MSK: no deformity of the feet CV: trace bilat leg edema Skin:  no ulcer on the feet.  normal color and temp on the feet.  Neuro: sensation is intact to touch on the feet.     Lab Results  Component Value Date   HGBA1C 9.1 (A) 07/20/2019       Assessment & Plan:  Insulin-requiring type 2 DM: worse Noncompliance with cbg recording at different times of day.  We discussed the need to check, in order to safely adjust insulin.  Patient Instructions  check your blood sugar twice a day.  vary the time of day when you check, between before the 3 meals, and at bedtime.  also check if you have symptoms of your blood sugar being too high or too low.  please keep a record of the readings and bring it to your next appointment here (or you can bring the meter itself).  You can write it on any piece of paper.  please call us sooner if your blood sugar goes below 70, or if you have a lot of readings over 200. On this type of insulin schedule, you should eat meals on a regular schedule.  If a meal is missed or significantly delayed, your blood sugar could go low.  Please reduce the Lantus to 90 units daily, and:  increase the Novolog to 30 units 3  times a day (just before each meal).   Please come back for a follow-up appointment in 2 months.

## 2019-07-20 NOTE — Assessment & Plan Note (Signed)
Both by exam and by echo, probably consistent with aortic sclerosis.  No evidence of stenosis.

## 2019-07-20 NOTE — Assessment & Plan Note (Signed)
On 40 mg Crestor.  LDL is 92. As a diabetic with hypertension, target LDL should be less than 100 with a goal of less than 70.  Continue for now.  Hopefully with weight loss and glycemic control her lipids will improve.  Combination of elevated triglycerides, obesity and diabetes plus hypertension equilibrates to metabolic syndrome. -> Consider coronary calcium scoring follow-up visits to establish baseline cardiovascular risk as she currently has a negative stress test

## 2019-07-20 NOTE — Assessment & Plan Note (Signed)
Managed by endocrinology.  Improving, but still not at goal.

## 2019-07-20 NOTE — Assessment & Plan Note (Signed)
Significant PACs noted on monitor however GXT suggested ventricular ectopy.  I wonder if this was a variant conduction.  Overall, symptoms are notably improved with current dose of Toprol.  Not requiring additional doses.  Provided symptoms remain pain stability, would hold off on antiarrhythmic agent besides beta-blocker.

## 2019-07-23 ENCOUNTER — Other Ambulatory Visit: Payer: Self-pay | Admitting: Family Medicine

## 2019-07-23 DIAGNOSIS — G4700 Insomnia, unspecified: Secondary | ICD-10-CM

## 2019-07-26 ENCOUNTER — Other Ambulatory Visit: Payer: Self-pay

## 2019-07-26 ENCOUNTER — Ambulatory Visit
Admission: RE | Admit: 2019-07-26 | Discharge: 2019-07-26 | Disposition: A | Discharge status: Home / Self Care | Payer: 59 | Source: Ambulatory Visit | Attending: Family Medicine | Admitting: Family Medicine

## 2019-07-26 DIAGNOSIS — Z1231 Encounter for screening mammogram for malignant neoplasm of breast: Secondary | ICD-10-CM

## 2019-07-30 ENCOUNTER — Telehealth: Payer: 59 | Admitting: Family Medicine

## 2019-07-31 ENCOUNTER — Other Ambulatory Visit: Payer: Self-pay | Admitting: Endocrinology

## 2019-07-31 DIAGNOSIS — E119 Type 2 diabetes mellitus without complications: Secondary | ICD-10-CM

## 2019-07-31 DIAGNOSIS — Z794 Long term (current) use of insulin: Secondary | ICD-10-CM

## 2019-08-07 ENCOUNTER — Other Ambulatory Visit: Payer: Self-pay | Admitting: Endocrinology

## 2019-08-07 DIAGNOSIS — E119 Type 2 diabetes mellitus without complications: Secondary | ICD-10-CM

## 2019-08-07 DIAGNOSIS — Z794 Long term (current) use of insulin: Secondary | ICD-10-CM

## 2019-08-20 ENCOUNTER — Other Ambulatory Visit: Payer: Self-pay | Admitting: Family Medicine

## 2019-08-20 DIAGNOSIS — G4700 Insomnia, unspecified: Secondary | ICD-10-CM

## 2019-09-24 ENCOUNTER — Ambulatory Visit (INDEPENDENT_AMBULATORY_CARE_PROVIDER_SITE_OTHER): Payer: 59 | Admitting: Endocrinology

## 2019-09-24 ENCOUNTER — Other Ambulatory Visit: Payer: Self-pay

## 2019-09-24 ENCOUNTER — Encounter: Payer: Self-pay | Admitting: Endocrinology

## 2019-09-24 VITALS — BP 126/70 | HR 65 | Ht 62.0 in | Wt 244.2 lb

## 2019-09-24 DIAGNOSIS — E119 Type 2 diabetes mellitus without complications: Secondary | ICD-10-CM

## 2019-09-24 DIAGNOSIS — E1165 Type 2 diabetes mellitus with hyperglycemia: Secondary | ICD-10-CM

## 2019-09-24 DIAGNOSIS — Z794 Long term (current) use of insulin: Secondary | ICD-10-CM | POA: Diagnosis not present

## 2019-09-24 LAB — POCT GLYCOSYLATED HEMOGLOBIN (HGB A1C): Hemoglobin A1C: 9.2 % — AB (ref 4.0–5.6)

## 2019-09-24 MED ORDER — LANTUS SOLOSTAR 100 UNIT/ML ~~LOC~~ SOPN: 130.0000 [IU] | PEN_INJECTOR | SUBCUTANEOUS | 11 refills | Status: DC

## 2019-09-24 NOTE — Patient Instructions (Addendum)
check your blood sugar twice a day.  vary the time of day when you check, between before the 3 meals, and at bedtime.  also check if you have symptoms of your blood sugar being too high or too low.  please keep a record of the readings and bring it to your next appointment here (or you can bring the meter itself).  You can write it on any piece of paper.  please call us sooner if your blood sugar goes below 70, or if you have a lot of readings over 200. On this type of insulin schedule, you should eat meals on a regular schedule.  If a meal is missed or significantly delayed, your blood sugar could go low.  Please increase the Lantus to 130 units daily, and stay off the Humalog and metformin.    Please come back for a follow-up appointment in 2 months.

## 2019-09-24 NOTE — Progress Notes (Signed)
Subjective:    [name], female    DOB: [birth year], 59 y.o.   MRN: [id number]  HPI Pt returns for f/u of diabetes mellitus: DM type: Insulin-requiring type 2.  Dx'ed: 6948 Complications: none Therapy: insulin since 2018 GDM: 1988 DKA: never Severe hypoglycemia: never Pancreatitis: never Pancreatic imaging: normal on 2013 CT Other: she did not tolerate Invokana (vaginitis); she stopped victoza, due to cost; she takes multiple daily injections Interval history: Pt says she has not recently taken the humalog.  no cbg record, but states cbg's are in the high-100's.   Past Medical History:  Diagnosis Date  . Allergy   . Anxiety 2013  . Asthma 2015  . Asthma   . Depression 2013  . Diabetes mellitus 2004  . Finding of multiple premature atrial contractions by electrocardiography   . GERD (gastroesophageal reflux disease) 2013  . Hyperlipidemia 1992  . Hypertension 1992  . Memory impairment     Past Surgical History:  Procedure Laterality Date  . ANTERIOR CRUCIATE LIGAMENT REPAIR  1997  . APPENDECTOMY  1987  . CARDIAC EVENT MONITOR  03/2019   No average rate 60 bpm.  Max HR 122, minimum HR 36 bpm-sinus bradycardia with PACs and bigeminy.  Frequent PACs seen as isolated, couplets, triplets as well as frequent bigeminy.Shirlyn Goltz SECTION     54,62,70,35  . CHOLECYSTECTOMY  1987  . NM MYOVIEW LTD  05/2019   Normal EF 60%.  Poor control hypertension.  (194/101 mmHg).  Small size moderate severity fixed defect in the mid anterior and apical anterior wall likely representing breast attenuation artifact.  No evidence of ischemia (reversibility).  LOW RISK  . TRANSTHORACIC ECHOCARDIOGRAM  03/2019   EF 55-60 % with normal wall motion.  Slight LA dilation with grade 1 diastolic function.  Moderate aortic valve calcification-sclerosis without stenosis.  Otherwise normal.  . uterine ablation      Social History   Socioeconomic History  . Marital status: Married      Spouse name: Not on file  . Number of children: Not on file  . Years of education: Not on file  . Highest education level: Not on file  Occupational History  . Not on file  Tobacco Use  . Smoking status: Never Smoker  . Smokeless tobacco: Never Used  Substance and Sexual Activity  . Alcohol use: No  . Drug use: No  . Sexual activity: Yes  Other Topics Concern  . Not on file  Social History Narrative   Married.    Lives with husband and youngest son, who is 33 y/o.   Has 4 children.   Does not work outside of home.    No formal exercise. Only activity around the house.   Tries to be careful with Diabetic diet.   Social Determinants of Health   Financial Resource Strain:   . Difficulty of Paying Living Expenses:   Food Insecurity:   . Worried About Charity fundraiser in the Last Year:   . Arboriculturist in the Last Year:   Transportation Needs:   . Film/video editor (Medical):   Marland Kitchen Lack of Transportation (Non-Medical):   Physical Activity:   . Days of Exercise per Week:   . Minutes of Exercise per Session:   Stress:   . Feeling of Stress :   Social Connections:   . Frequency of Communication with Friends and Family:   . Frequency of Social Gatherings with Friends  and Family:   . Attends Religious Services:   . Active Member of Clubs or Organizations:   . Attends Archivist Meetings:   Marland Kitchen Marital Status:   Intimate Partner Violence:   . Fear of Current or Ex-Partner:   . Emotionally Abused:   Marland Kitchen Physically Abused:   . Sexually Abused:     Current Outpatient Medications on File Prior to Visit  Medication Sig Dispense Refill  . aspirin 81 MG tablet Take 1 tablet (81 mg total) by mouth daily. 30 tablet 11  . blood glucose meter kit and supplies KIT Inject 1 each into the skin as directed. Dispense based on patient and insurance preference. Check blood sugar fasting each morning.  ICD E11.65 1 each 0  . Blood Glucose Monitoring Suppl (FREESTYLE LITE)  DEVI UAD.E11.65  0  . chlorhexidine gluconate, MEDLINE KIT, (PERIDEX) 0.12 % solution Use as directed 15 mLs in the mouth or throat daily. 120 mL 0  . [facility] D3 2000 units CAPS Take 2 capsules (4,000 Units total) by mouth daily. 30 each 3  . FLUoxetine (PROZAC) 20 MG tablet TAKE 1 TABLET BY MOUTH EVERY DAY 90 tablet 2  . glucose blood test strip Dispense based on patient and insurance preference.  Check blood sugar fasting each morning.  ICD E11.65 50 each 12  . Insulin Pen Needle (PEN NEEDLES) 30G X 8 MM MISC 1 each by Does not apply route 4 (four) times daily. E11.9 200 each 1  . Lancets MISC Dispense based on patient and insurance preference.  Check blood sugar fasting each morning.  ICD E11.65 100 each 3  . lisinopril-hydrochlorothiazide (ZESTORETIC) 20-12.5 MG tablet Take 1 tablet by mouth daily. 90 tablet 3  . LORazepam (ATIVAN) 1 MG tablet Take 0.5-1 tablets (0.5-1 mg total) by mouth 2 (two) times daily as needed for anxiety or sleep (use sparingly). 60 tablet 0  . metoprolol succinate (TOPROL-XL) 25 MG 24 hr tablet Take 1 tablet (25 mg total) by mouth daily. 90 tablet 2  . pantoprazole (PROTONIX) 40 MG tablet Take 1 tablet (40 mg total) by mouth daily. 90 tablet 3  . rosuvastatin (CRESTOR) 40 MG tablet Take 1 tablet (40 mg total) by mouth daily. 90 tablet 3  . tizanidine (ZANAFLEX) 2 MG capsule Take 1 capsule (2 mg total) by mouth 3 (three) times daily as needed for muscle spasms. 90 capsule 1  . tiZANidine (ZANAFLEX) 4 MG tablet SMARTSIG:3 Tablet(s) By Mouth Every Other Day PRN    . traZODone (DESYREL) 50 MG tablet TAKE 1/2 UP TO 2 TABLETS BY MOUTH AT BEDTIME AS NEEDED FOR SLEEP 180 tablet 2   No current facility-administered medications on file prior to visit.    Allergies  Allergen Reactions  . Contrast Media [Iodinated Diagnostic Agents] Anaphylaxis  . Ambien [Zolpidem Tartrate] Other (See Comments)    Family has told her that after taking Ambien, she does "crazy things" that she does  not remember doing.   . Codeine Nausea And Vomiting    Family History  Problem Relation Age of Onset  . Cancer Father        Lymphoma, Leukemia  . Depression Father   . Cancer Maternal Grandfather   . Cancer Paternal Grandmother   . Diabetes Paternal Grandfather   . Kidney disease Paternal Grandfather     BP 126/70   Pulse 65   Ht '5\' 2"'$  (1.575 m)   Wt 244 lb 3.2 oz (110.8 kg)   SpO2 96%  BMI 44.66 kg/m    Review of Systems She denies hypoglycemia.  She stopped metformin, due to abd pain.      Objective:   Physical Exam VITAL SIGNS:  See vs page GENERAL: no distress Pulses: dorsalis pedis intact bilat.   MSK: no deformity of the feet CV: trace bilat leg edema Skin:  no ulcer on the feet.  normal color and temp on the feet.  Neuro: sensation is intact to touch on the feet.    Lab Results  Component Value Date   HGBA1C 9.2 (A) 09/24/2019       Assessment & Plan:  Insulin-requiring type 2 DM: worse.  We discussed.  She wants to d/c humalog.    Patient Instructions  check your blood sugar twice a day.  vary the time of day when you check, between before the 3 meals, and at bedtime.  also check if you have symptoms of your blood sugar being too high or too low.  please keep a record of the readings and bring it to your next appointment here (or you can bring the meter itself).  You can write it on any piece of paper.  please call us sooner if your blood sugar goes below 70, or if you have a lot of readings over 200. On this type of insulin schedule, you should eat meals on a regular schedule.  If a meal is missed or significantly delayed, your blood sugar could go low.  Please increase the Lantus to 130 units daily, and stay off the Humalog and metformin.    Please come back for a follow-up appointment in 2 months.

## 2019-10-29 ENCOUNTER — Telehealth (INDEPENDENT_AMBULATORY_CARE_PROVIDER_SITE_OTHER): Payer: 59 | Admitting: Family Medicine

## 2019-10-29 ENCOUNTER — Other Ambulatory Visit: Payer: Self-pay

## 2019-10-29 ENCOUNTER — Encounter: Payer: Self-pay | Admitting: Family Medicine

## 2019-10-29 VITALS — Ht 62.0 in | Wt 244.0 lb

## 2019-10-29 DIAGNOSIS — R001 Bradycardia, unspecified: Secondary | ICD-10-CM

## 2019-10-29 DIAGNOSIS — E785 Hyperlipidemia, unspecified: Secondary | ICD-10-CM | POA: Diagnosis not present

## 2019-10-29 DIAGNOSIS — K219 Gastro-esophageal reflux disease without esophagitis: Secondary | ICD-10-CM

## 2019-10-29 DIAGNOSIS — Z794 Long term (current) use of insulin: Secondary | ICD-10-CM

## 2019-10-29 DIAGNOSIS — F411 Generalized anxiety disorder: Secondary | ICD-10-CM

## 2019-10-29 DIAGNOSIS — I10 Essential (primary) hypertension: Secondary | ICD-10-CM

## 2019-10-29 DIAGNOSIS — G4700 Insomnia, unspecified: Secondary | ICD-10-CM

## 2019-10-29 DIAGNOSIS — E119 Type 2 diabetes mellitus without complications: Secondary | ICD-10-CM | POA: Diagnosis not present

## 2019-10-29 DIAGNOSIS — Z6841 Body Mass Index (BMI) 40.0 and over, adult: Secondary | ICD-10-CM

## 2019-10-29 DIAGNOSIS — I491 Atrial premature depolarization: Secondary | ICD-10-CM

## 2019-10-29 DIAGNOSIS — F410 Panic disorder [episodic paroxysmal anxiety] without agoraphobia: Secondary | ICD-10-CM

## 2019-10-29 NOTE — Progress Notes (Signed)
Name: Alicia Pope   MRN: [id number]    DOB: 1960-09-14   Date:10/29/2019       Progress Note  Subjective:    I connected with  Alicia Pope  on 10/29/19 at  9:40 AM EDT by a video enabled telemedicine application and verified that I am speaking with the correct person using two identifiers.  I discussed the limitations of evaluation and management by telemedicine and the availability of in person appointments. The patient expressed understanding and agreed to proceed. Staff also discussed with the patient that there may be a patient responsible charge related to this service. Patient Location: in car Provider Location: out of town but in private home office  Additional Individuals present: husband and son present in car with pt(?)  Offered to call her back to do visit later, but she wanted to do   Chief Complaint  Patient presents with  . Follow-up  . Insomnia  . Gastroesophageal Reflux  . Hyperlipidemia  . Hypertension    Alicia Pope is a 59 y.o. female, presents for virtual visit for routine follow up on the conditions listed above.  Hypertension:  Currently managed on metoprolol 25 mg daily per cardiology for palpitations, HR runs low - she cannot increase dose but overall sx have been improved with 25 mg dose, she does not have worse sxanymore  at night and when going to bed- and she denies any severe fatigue, exertional sx or SOB.  She also take lisinopril HCTZ - no BP reading today - but last several visits it has been well controlled.  She does have BP cuff at home but just moved and doesn't have it out and available right now Pt reports good med compliance and denies any SE.  No lightheadedness, hypotension, syncope. Blood pressure today is well controlled. BP Readings from Last 3 Encounters:  09/24/19 126/70  07/20/19 120/80  07/16/19 120/76   Pt denies CP, SOB, exertional sx, LE edema, palpitation, Ha's, visual disturbances  Hyperlipidemia: Currently treated  with crestor 40 mg daily , pt reports good med compliance Last Lipids: Lab Results  Component Value Date   CHOL 171 06/28/2019   HDL 53 06/28/2019   LDLCALC 92 06/28/2019   TRIG 166 (H) 06/28/2019   CHOLHDL 3.2 06/28/2019   - Denies: Chest pain, shortness of breath, myalgias, claudication   GERD:  On protonix 40 mg daily, sx well controlled with this, has not tried to wean off yet.  Denies any current abd pain, reflux, N/V, postprandial fullness, indigestion.    Insomnia/anxiety/depression:   On prozac 20 mg, added trial of trazodone for sleep, she notes 25-50 mg does not help at all with her sleep.  She usually sleeps from 2 am to 7 am.  Also had been on benzos for years for severe anxiety, we have tried to decrease use over the past 2+ years, she would use primarily to help her sleep.  She still has not gone to psychiatry.      Patient Active Problem List   Diagnosis Date Noted  . PAC (premature atrial contraction) 04/24/2019  . Sinus bradycardia, persistent 04/24/2019  . Exertional dyspnea 04/24/2019  . Systolic ejection murmur 02/72/5366  . Diabetes mellitus, type II, insulin dependent ([place]) 04/29/2016  . Insomnia 04/02/2015  . Obesity 09/23/2014  . Vitamin D deficiency 09/23/2014  . Screening for colorectal cancer 09/23/2014  . Anxiety   . Depression   . GERD (gastroesophageal reflux disease)   . Essential hypertension   .  Hyperlipidemia     Current Outpatient Medications:  .  aspirin 81 MG tablet, Take 1 tablet (81 mg total) by mouth daily., Disp: 30 tablet, Rfl: 11 .  insulin glargine (LANTUS SOLOSTAR) 100 UNIT/ML Solostar Pen, Inject 130 Units into the skin every morning., Disp: 15 pen, Rfl: 11 .  lisinopril-hydrochlorothiazide (ZESTORETIC) 20-12.5 MG tablet, Take 1 tablet by mouth daily., Disp: 90 tablet, Rfl: 3 .  LORazepam (ATIVAN) 1 MG tablet, Take 0.5-1 tablets (0.5-1 mg total) by mouth 2 (two) times daily as needed for anxiety or sleep (use sparingly)., Disp: 60  tablet, Rfl: 0 .  metoprolol succinate (TOPROL-XL) 25 MG 24 hr tablet, Take 1 tablet (25 mg total) by mouth daily., Disp: 90 tablet, Rfl: 2 .  pantoprazole (PROTONIX) 40 MG tablet, Take 1 tablet (40 mg total) by mouth daily., Disp: 90 tablet, Rfl: 3 .  rosuvastatin (CRESTOR) 40 MG tablet, Take 1 tablet (40 mg total) by mouth daily., Disp: 90 tablet, Rfl: 3 .  tiZANidine (ZANAFLEX) 4 MG tablet, SMARTSIG:3 Tablet(s) By Mouth Every Other Day PRN, Disp: , Rfl:  .  traZODone (DESYREL) 50 MG tablet, TAKE 1/2 UP TO 2 TABLETS BY MOUTH AT BEDTIME AS NEEDED FOR SLEEP, Disp: 180 tablet, Rfl: 2 .  blood glucose meter kit and supplies KIT, Inject 1 each into the skin as directed. Dispense based on patient and insurance preference. Check blood sugar fasting each morning.  ICD E11.65, Disp: 1 each, Rfl: 0 .  Blood Glucose Monitoring Suppl (FREESTYLE LITE) DEVI, UAD.E11.65, Disp: , Rfl: 0 .  [employer] D3 2000 units CAPS, Take 2 capsules (4,000 Units total) by mouth daily., Disp: 30 each, Rfl: 3 .  FLUoxetine (PROZAC) 20 MG tablet, TAKE 1 TABLET BY MOUTH EVERY DAY, Disp: 90 tablet, Rfl: 2 .  glucose blood test strip, Dispense based on patient and insurance preference.  Check blood sugar fasting each morning.  ICD E11.65, Disp: 50 each, Rfl: 12 .  Insulin Pen Needle (PEN NEEDLES) 30G X 8 MM MISC, 1 each by Does not apply route 4 (four) times daily. E11.9, Disp: 200 each, Rfl: 1 .  Lancets MISC, Dispense based on patient and insurance preference.  Check blood sugar fasting each morning.  ICD E11.65, Disp: 100 each, Rfl: 3 Allergies  Allergen Reactions  . Contrast Media [Iodinated Diagnostic Agents] Anaphylaxis  . Ambien [Zolpidem Tartrate] Other (See Comments)    Family has told her that after taking Ambien, she does "crazy things" that she does not remember doing.   . Codeine Nausea And Vomiting    Past Surgical History:  Procedure Laterality Date  . ANTERIOR CRUCIATE LIGAMENT REPAIR  1997  . APPENDECTOMY  1987  .  CARDIAC EVENT MONITOR  03/2019   No average rate 60 bpm.  Max HR 122, minimum HR 36 bpm-sinus bradycardia with PACs and bigeminy.  Frequent PACs seen as isolated, couplets, triplets as well as frequent bigeminy.Alicia Pope SECTION     66,06,30,16  . CHOLECYSTECTOMY  1987  . NM MYOVIEW LTD  05/2019   Normal EF 60%.  Poor control hypertension.  (194/101 mmHg).  Small size moderate severity fixed defect in the mid anterior and apical anterior wall likely representing breast attenuation artifact.  No evidence of ischemia (reversibility).  LOW RISK  . TRANSTHORACIC ECHOCARDIOGRAM  03/2019   EF 55-60 % with normal wall motion.  Slight LA dilation with grade 1 diastolic function.  Moderate aortic valve calcification-sclerosis without stenosis.  Otherwise normal.  . uterine  ablation     Family History  Problem Relation Age of Onset  . Cancer Father        Lymphoma, Leukemia  . Depression Father   . Cancer Maternal Grandfather   . Cancer Paternal Grandmother   . Diabetes Paternal Grandfather   . Kidney disease Paternal Grandfather    Social History   Socioeconomic History  . Marital status: Married    Spouse name: Not on file  . Number of children: Not on file  . Years of education: Not on file  . Highest education level: Not on file  Occupational History  . Not on file  Tobacco Use  . Smoking status: Never Smoker  . Smokeless tobacco: Never Used  Substance and Sexual Activity  . Alcohol use: No  . Drug use: No  . Sexual activity: Yes  Other Topics Concern  . Not on file  Social History Narrative   Married.    Lives with husband and youngest son, who is 29 y/o.   Has 4 children.   Does not work outside of home.    No formal exercise. Only activity around the house.   Tries to be careful with Diabetic diet.   Social Determinants of Health   Financial Resource Strain:   . Difficulty of Paying Living Expenses:   Food Insecurity:   . Worried About Charity fundraiser in the  Last Year:   . Arboriculturist in the Last Year:   Transportation Needs:   . Film/video editor (Medical):   Marland Kitchen Lack of Transportation (Non-Medical):   Physical Activity:   . Days of Exercise per Week:   . Minutes of Exercise per Session:   Stress:   . Feeling of Stress :   Social Connections:   . Frequency of Communication with Friends and Family:   . Frequency of Social Gatherings with Friends and Family:   . Attends Religious Services:   . Active Member of Clubs or Organizations:   . Attends Archivist Meetings:   Marland Kitchen Marital Status:   Intimate Partner Violence:   . Fear of Current or Ex-Partner:   . Emotionally Abused:   Marland Kitchen Physically Abused:   . Sexually Abused:     Chart Review Today: I personally reviewed active problem list, medication list, allergies, family history, social history, health maintenance, notes from last encounter, lab results, imaging with the patient/caregiver today.  Reviewed last endocrine visit  Review of Systems  10 Systems reviewed and are negative for acute change except as noted in the HPI.   Objective:    Virtual encounter, vitals limited, only able to obtain the following Today's Vitals   10/29/19 0920  Weight: (!) 244 lb (110.7 kg)  Height: 5' 2" (1.575 m)   Body mass index is 44.63 kg/m. Nursing Note and Vital Signs reviewed.  Physical Exam Vitals and nursing note reviewed.  Constitutional:      General: She is not in acute distress.    Appearance: Normal appearance. She is obese. She is not ill-appearing, toxic-appearing or diaphoretic.  Pulmonary:     Effort: Pulmonary effort is normal.  Neurological:     Mental Status: She is alert. Mental status is at baseline.  Psychiatric:        Mood and Affect: Mood normal.        Behavior: Behavior normal.     PE limited by telephone encounter  No results found for this or any previous visit (from the  past 72 hour(s)).  PHQ2/9: Depression screen [facility] 2/9 10/29/2019  06/28/2019 01/31/2019 01/10/2019 06/06/2018  Decreased Interest 0 0 0 0 0  Down, Depressed, Hopeless 0 1 2 0 0  PHQ - 2 Score 0 1 2 0 0  Altered sleeping 0 3 0 0 3  Tired, decreased energy 0 1 0 0 0  Change in appetite 0 0 0 0 0  Feeling bad or failure about yourself  0 0 0 0 0  Trouble concentrating 0 1 0 0 0  Moving slowly or fidgety/restless 0 0 0 0 0  Suicidal thoughts 0 0 0 0 0  PHQ-9 Score 0 6 2 0 3  Difficult doing work/chores Not difficult at all Somewhat difficult Not difficult at all Not difficult at all Somewhat difficult  Some recent data might be hidden   PHQ-2/9 Result is neg, reviewed  Fall Risk: Fall Risk  10/29/2019 06/28/2019 01/31/2019 01/10/2019 03/15/2018  Falls in the past year? 0 0 0 0 0  Number falls in past yr: 0 0 0 0 -  Injury with Fall? 0 0 0 0 -     Assessment and Plan:     ICD-10-CM   1. Type 2 diabetes mellitus without complication, with long-term current use of insulin (HCC)  E11.9    Z79.4    per endo, uncontrolled, last A1C 9.1  2. Gastroesophageal reflux disease, unspecified whether esophagitis present  K21.9    sx well controlled, discussed longterm PPI SE/risks, encouraged weaning trial, start pepcid BID and wean off protonix, if unable to pt to notify me, GI referral  3. Hyperlipidemia, unspecified hyperlipidemia type  E78.5    compliant with statin, lipid panel done a few months, ago, reviewed today, well controlled, continue statin and healthy diet/exercise efforts  4. Essential hypertension  I10    Stable, well controlled BP, last labs showed normal renal function and electrolytes, continue lisinopril-HCTZ and metoprolol per cardiology  5. Class 3 severe obesity with serious comorbidity and body mass index (BMI) of 45.0 to 49.9 in adult, unspecified obesity type ([place])  E66.01    Z68.42    significant morbid obesity - she has declined more specialized weight management in the past  6. Insomnia, unspecified type  G47.00    multifactorial -  encouraged her to go to psychiatry - increase trazodone dose to 100 mg at bedtime prn, consider sleep study- suspect osa with bmi/bodyhabitus  7. Generalized anxiety disorder with panic attacks  F41.1    F41.0    continue to encourage pt to establish with psychiatry/specialists for med management and therapy, continue prozac, phq neg and reviewed  8. PAC (premature atrial contraction)  I49.1    sx/palpitations well controlled, per cardiology  9. Sinus bradycardia, persistent  R00.1    Sinus brady with low resting and nighttime HR, currently tolerating BB w/o many SE or sx, DOE has improved    Routine f/up in 6 months - pt to contact me sooner regarding insomnia and willingness to do sleep study or see psychiatry Also to f/up sooner with GERD/GI sx if unable to wean off PPI, pt verbalized understanding of SE of long term PPI use  I discussed the assessment and treatment plan with the patient. The patient was provided an opportunity to ask questions and all were answered. The patient agreed with the plan and demonstrated an understanding of the instructions.  The patient was advised to call back or seek an in-person evaluation if the symptoms worsen or if  the condition fails to improve as anticipated.  I provided 30+ minutes of non-face-to-face time during this encounter.  Delsa Grana, PA-C 10/29/19 10:40 AM

## 2019-10-29 NOTE — Patient Instructions (Signed)
Try to wean off protonix, start pepcid twice a day and try to skip protonix doses  Please notify me if unable to get off protonix meds    For insomnia - I do recommend seeing psychiatry and therapist/specialists - because insomnia has a lot to do with psychology and they would be very helpful.  Increase trazodone dose to 100 mg at bedtime  We will do regular follow up in 6 months

## 2019-11-03 ENCOUNTER — Other Ambulatory Visit: Payer: Self-pay | Admitting: Family Medicine

## 2019-11-03 DIAGNOSIS — F430 Acute stress reaction: Secondary | ICD-10-CM

## 2019-11-03 DIAGNOSIS — F410 Panic disorder [episodic paroxysmal anxiety] without agoraphobia: Secondary | ICD-10-CM

## 2019-11-09 ENCOUNTER — Ambulatory Visit (INDEPENDENT_AMBULATORY_CARE_PROVIDER_SITE_OTHER): Payer: 59 | Admitting: Family Medicine

## 2019-11-09 ENCOUNTER — Other Ambulatory Visit: Payer: Self-pay

## 2019-11-09 ENCOUNTER — Encounter: Payer: Self-pay | Admitting: Family Medicine

## 2019-11-09 VITALS — BP 116/76 | HR 70 | Temp 97.8°F | Resp 18 | Ht 62.0 in | Wt 255.6 lb

## 2019-11-09 DIAGNOSIS — R635 Abnormal weight gain: Secondary | ICD-10-CM | POA: Diagnosis not present

## 2019-11-09 DIAGNOSIS — R0602 Shortness of breath: Secondary | ICD-10-CM

## 2019-11-09 DIAGNOSIS — R600 Localized edema: Secondary | ICD-10-CM | POA: Diagnosis not present

## 2019-11-09 MED ORDER — POTASSIUM CHLORIDE CRYS ER 20 MEQ PO TBCR: EXTENDED_RELEASE_TABLET | ORAL | 0 refills | Status: DC

## 2019-11-09 MED ORDER — FUROSEMIDE 20 MG PO TABS: 20.0000 mg | ORAL_TABLET | Freq: Every day | ORAL | 0 refills | Status: DC | PRN

## 2019-11-09 NOTE — Progress Notes (Signed)
[name], female    DOB: 1961-03-18, 59 y.o.   MRN: [id number]  PCP: Delsa Grana, PA-C  Chief Complaint  Patient presents with  . Foot Swelling    bilateral for 3 days    Subjective:   Alicia Pope is a 59 y.o. female, presents to clinic for ~3 days swelling to feet and legs, with tightness in skin with some rash Palpitations got worse last weekend and she increased metoprolol XL(per cardiology) to 37.5 over the weekend, it made her really SOB -  And shortly after she developed LE edema, some orthopnea, and weigh increase Wt Readings from Last 5 Encounters:  11/09/19 255 lb 9.6 oz (115.9 kg)  10/29/19 (!) 244 lb (110.7 kg)  09/24/19 244 lb 3.2 oz (110.8 kg)  07/20/19 243 lb (110.2 kg)  07/16/19 240 lb (108.9 kg)   BMI Readings from Last 5 Encounters:  11/09/19 46.75 kg/m  10/29/19 44.63 kg/m  09/24/19 44.66 kg/m  07/20/19 44.45 kg/m  07/16/19 43.90 kg/m      Patient Active Problem List   Diagnosis Date Noted  . PAC (premature atrial contraction) 04/24/2019  . Sinus bradycardia, persistent 04/24/2019  . Exertional dyspnea 04/24/2019  . Systolic ejection murmur 12/26/3005  . Diabetes mellitus, type II, insulin dependent ([place]) 04/29/2016  . Insomnia 04/02/2015  . Obesity 09/23/2014  . Vitamin D deficiency 09/23/2014  . Screening for colorectal cancer 09/23/2014  . Anxiety   . Depression   . GERD (gastroesophageal reflux disease)   . Essential hypertension   . Hyperlipidemia       Current Outpatient Medications:  .  aspirin 81 MG tablet, Take 1 tablet (81 mg total) by mouth daily., Disp: 30 tablet, Rfl: 11 .  CVS D3 2000 units CAPS, Take 2 capsules (4,000 Units total) by mouth daily., Disp: 30 each, Rfl: 3 .  FLUoxetine (PROZAC) 20 MG tablet, TAKE 1 TABLET BY MOUTH EVERY DAY, Disp: 90 tablet, Rfl: 2 .  insulin glargine (LANTUS SOLOSTAR) 100 UNIT/ML Solostar Pen, Inject 130 Units into the skin every morning., Disp: 15 pen, Rfl: 11 .   lisinopril-hydrochlorothiazide (ZESTORETIC) 20-12.5 MG tablet, Take 1 tablet by mouth daily., Disp: 90 tablet, Rfl: 3 .  LORazepam (ATIVAN) 1 MG tablet, Take 0.5-1 tablets (0.5-1 mg total) by mouth 2 (two) times daily as needed for anxiety or sleep (use sparingly)., Disp: 60 tablet, Rfl: 0 .  metoprolol succinate (TOPROL-XL) 25 MG 24 hr tablet, Take 1 tablet (25 mg total) by mouth daily., Disp: 90 tablet, Rfl: 2 .  pantoprazole (PROTONIX) 40 MG tablet, Take 1 tablet (40 mg total) by mouth daily., Disp: 90 tablet, Rfl: 3 .  rosuvastatin (CRESTOR) 40 MG tablet, Take 1 tablet (40 mg total) by mouth daily., Disp: 90 tablet, Rfl: 3 .  tiZANidine (ZANAFLEX) 4 MG tablet, SMARTSIG:3 Tablet(s) By Mouth Every Other Day PRN, Disp: , Rfl:  .  traZODone (DESYREL) 50 MG tablet, TAKE 1/2 UP TO 2 TABLETS BY MOUTH AT BEDTIME AS NEEDED FOR SLEEP, Disp: 180 tablet, Rfl: 2 .  blood glucose meter kit and supplies KIT, Inject 1 each into the skin as directed. Dispense based on patient and insurance preference. Check blood sugar fasting each morning.  ICD E11.65 (Patient not taking: Reported on 11/09/2019), Disp: 1 each, Rfl: 0 .  Blood Glucose Monitoring Suppl (FREESTYLE LITE) DEVI, UAD.E11.65 (Patient not taking: Reported on 11/09/2019), Disp: , Rfl: 0 .  furosemide (LASIX) 20 MG tablet, Take 1 tablet (20  mg total) by mouth daily as needed for fluid or edema., Disp: 30 tablet, Rfl: 0 .  glucose blood test strip, Dispense based on patient and insurance preference.  Check blood sugar fasting each morning.  ICD E11.65 (Patient not taking: Reported on 11/09/2019), Disp: 50 each, Rfl: 12 .  Insulin Pen Needle (PEN NEEDLES) 30G X 8 MM MISC, 1 each by Does not apply route 4 (four) times daily. E11.9 (Patient not taking: Reported on 11/09/2019), Disp: 200 each, Rfl: 1 .  Lancets MISC, Dispense based on patient and insurance preference.  Check blood sugar fasting each morning.  ICD E11.65 (Patient not taking: Reported on 11/09/2019), Disp: 100  each, Rfl: 3 .  potassium chloride SA (KLOR-CON) 20 MEQ tablet, Take one tablet PO once daily when taking lasix, Disp: 30 tablet, Rfl: 0   Allergies  Allergen Reactions  . Contrast Media [Iodinated Diagnostic Agents] Anaphylaxis  . Ambien [Zolpidem Tartrate] Other (See Comments)    Family has told her that after taking Ambien, she does "crazy things" that she does not remember doing.   . Codeine Nausea And Vomiting     Social History   Tobacco Use  . Smoking status: Never Smoker  . Smokeless tobacco: Never Used  Substance Use Topics  . Alcohol use: No  . Drug use: No      Chart Review Today: I personally reviewed active problem list, medication list, allergies, family history, social history, health maintenance, notes from last encounter, lab results, imaging with the patient/caregiver today.   Review of Systems 10 Systems reviewed and are negative for acute change except as noted in the HPI.     Objective:   Vitals:   11/09/19 0906  BP: 116/76  Pulse: 70  Resp: 18  Temp: 97.8 F (36.6 C)  TempSrc: Temporal  SpO2: 97%  Weight: 255 lb 9.6 oz (115.9 kg)  Height: '5\' 2"'$  (1.575 m)    Body mass index is 46.75 kg/m.  Physical Exam Vitals and nursing note reviewed.  Constitutional:      General: She is not in acute distress.    Appearance: Normal appearance. She is obese. She is not ill-appearing, toxic-appearing or diaphoretic.  HENT:     Head: Normocephalic and atraumatic.  Eyes:     General: No scleral icterus.       Right eye: No discharge.        Left eye: No discharge.  Cardiovascular:     Rate and Rhythm: Normal rate and regular rhythm.     Pulses: Normal pulses.     Heart sounds: Normal heart sounds.  Pulmonary:     Effort: Pulmonary effort is normal. No respiratory distress.     Breath sounds: Normal breath sounds. No wheezing, rhonchi or rales.  Musculoskeletal:     Right lower leg: Edema present.     Left lower leg: Edema present.  Skin:     Findings: Rash present.  Neurological:     Mental Status: She is alert.      Results for orders placed or performed in visit on 09/24/19  POCT HgB A1C  Result Value Ref Range   Hemoglobin A1C 9.2 (A) 4.0 - 5.6 %   HbA1c POC (<> result, manual entry)     HbA1c, POC (prediabetic range)     HbA1c, POC (controlled diabetic range)         Assessment & Plan:   1. Lower extremity edema Increase swelling, encouraged low salt, elevating legs, compression socks -  checking labs to r/o CHF - EKG done today - no change from prior  Pt to use lasix 2-3 days only to improve swelling - may need to f/up with cardiology or vascular if recurrent.  - furosemide (LASIX) 20 MG tablet; Take 1 tablet (20 mg total) by mouth daily as needed for fluid or edema.  Dispense: 30 tablet; Refill: 0 - COMPLETE METABOLIC PANEL WITH GFR - CBC with Differential/Platelet - potassium chloride SA (KLOR-CON) 20 MEQ tablet; Take one tablet PO once daily when taking lasix  Dispense: 30 tablet; Refill: 0  2. SOB (shortness of breath) - furosemide (LASIX) 20 MG tablet; Take 1 tablet (20 mg total) by mouth daily as needed for fluid or edema.  Dispense: 30 tablet; Refill: 0 - Brain natriuretic peptide - COMPLETE METABOLIC PANEL WITH GFR - CBC with Differential/Platelet - potassium chloride SA (KLOR-CON) 20 MEQ tablet; Take one tablet PO once daily when taking lasix  Dispense: 30 tablet; Refill: 0  3. Weight increase Hypervolemic? R/o thyroid  Low salt diet Watch salt, carbs, activity level and palpitation sx - TSH      Delsa Grana, PA-C 11/09/19 9:56 AM

## 2019-11-09 NOTE — Patient Instructions (Addendum)
Take the lasix for the next 2-3 days, take potassium supplement when taking lasix.  You will urinate a lot more when taking lasix so take in the morning.  Check your weights at home - this will help you monitor the fluid coming off your body and hope to gety ou to your "DRY WEIGHT" a few weeks ago you were 244, today you were 255  Avoid salt in diet and drinks - check labs and see below  I am checking your labs for several things that would help me figure out the causes for retaining fluid.    Please call cardiology today to see if you can get in for f/up ASAP - it would be important for them to know your increase palpitations, the severe shortness of breath with the small dose increase of metoprolol, increased weight.   Try to elevate legs periodically, try some compression socks - for rash on legs it will get better when we get the fluid off - but for itching and rash you can use over the counter cortisone to affected areas.  Edema  Edema is when you have too much fluid in your body or under your skin. Edema may make your legs, feet, and ankles swell up. Swelling is also common in looser tissues, like around your eyes. This is a common condition. It gets more common as you get older. There are many possible causes of edema. Eating too much salt (sodium) and being on your feet or sitting for a long time can cause edema in your legs, feet, and ankles. Hot weather may make edema worse. Edema is usually painless. Your skin may look swollen or shiny. Follow these instructions at home:  Keep the swollen body part raised (elevated) above the level of your heart when you are sitting or lying down.  Do not sit still or stand for a long time.  Do not wear tight clothes. Do not wear garters on your upper legs.  Exercise your legs. This can help the swelling go down.  Wear elastic bandages or support stockings as told by your doctor.  Eat a low-salt (low-sodium) diet to reduce fluid as told by  your doctor.  Depending on the cause of your swelling, you may need to limit how much fluid you drink (fluid restriction).  Take over-the-counter and prescription medicines only as told by your doctor. Contact a doctor if:  Treatment is not working.  You have heart, liver, or kidney disease and have symptoms of edema.  You have sudden and unexplained weight gain. Get help right away if:  You have shortness of breath or chest pain.  You cannot breathe when you lie down.  You have pain, redness, or warmth in the swollen areas.  You have heart, liver, or kidney disease and get edema all of a sudden.  You have a fever and your symptoms get worse all of a sudden. Summary  Edema is when you have too much fluid in your body or under your skin.  Edema may make your legs, feet, and ankles swell up. Swelling is also common in looser tissues, like around your eyes.  Raise (elevate) the swollen body part above the level of your heart when you are sitting or lying down.  Follow your doctor's instructions about diet and how much fluid you can drink (fluid restriction). This information is not intended to replace advice given to you by your health care provider. Make sure you discuss any questions you have with your  health care provider. Document Revised: 03/25/2017 Document Reviewed: 04/09/2016 Elsevier Patient Education  2020 [employer] Inc[employer]   DASH Eating Plan DASH stands for "Dietary Approaches to Stop Hypertension." The DASH eating plan is a healthy eating plan that has been shown to reduce high blood pressure (hypertension). It may also reduce your risk for type 2 diabetes, heart disease, and stroke. The DASH eating plan may also help with weight loss. What are tips for following this plan?  General guidelines Avoid eating more than 2,300 mg (milligrams) of salt (sodium) a day. If you have hypertension, you may need to reduce your sodium intake to 1,500 mg a day. Limit alcohol intake  to no more than 1 drink a day for nonpregnant women and 2 drinks a day for men. One drink equals 12 oz of beer, 5 oz of wine, or 1 oz of hard liquor. Work with your health care provider to maintain a healthy body weight or to lose weight. Ask what an ideal weight is for you. Get at least 30 minutes of exercise that causes your heart to beat faster (aerobic exercise) most days of the week. Activities may include walking, swimming, or biking. Work with your health care provider or diet and nutrition specialist (dietitian) to adjust your eating plan to your individual calorie needs. Reading food labels  Check food labels for the amount of sodium per serving. Choose foods with less than 5 percent of the Daily Value of sodium. Generally, foods with less than 300 mg of sodium per serving fit into this eating plan. To find whole grains, look for the word "whole" as the first word in the ingredient list. Shopping Buy products labeled as "low-sodium" or "no salt added." Buy fresh foods. Avoid canned foods and premade or frozen meals. Cooking Avoid adding salt when cooking. Use salt-free seasonings or herbs instead of table salt or sea salt. Check with your health care provider or pharmacist before using salt substitutes. Do not fry foods. Cook foods using healthy methods such as baking, boiling, grilling, and broiling instead. Cook with heart-healthy oils, such as olive, canola, soybean, or sunflower oil. Meal planning Eat a balanced diet that includes: 5 or more servings of fruits and vegetables each day. At each meal, try to fill half of your plate with fruits and vegetables. Up to 6-8 servings of whole grains each day. Less than 6 oz of lean meat, poultry, or fish each day. A 3-oz serving of meat is about the same size as a deck of cards. One egg equals 1 oz. 2 servings of low-fat dairy each day. A serving of nuts, seeds, or beans 5 times each week. Heart-healthy fats. Healthy fats called Omega-3  fatty acids are found in foods such as flaxseeds and coldwater fish, like sardines, salmon, and mackerel. Limit how much you eat of the following: Canned or prepackaged foods. Food that is high in trans fat, such as fried foods. Food that is high in saturated fat, such as fatty meat. Sweets, desserts, sugary drinks, and other foods with added sugar. Full-fat dairy products. Do not salt foods before eating. Try to eat at least 2 vegetarian meals each week. Eat more home-cooked food and less restaurant, buffet, and fast food. When eating at a restaurant, ask that your food be prepared with less salt or no salt, if possible. What foods are recommended? The items listed may not be a complete list. Talk with your dietitian about what dietary choices are best for you. Grains Whole-grain or  whole-wheat bread. Whole-grain or whole-wheat pasta. Brown rice. Orpah Cobb. Bulgur. Whole-grain and low-sodium cereals. Pita bread. Low-fat, low-sodium crackers. Whole-wheat flour tortillas. Vegetables Fresh or frozen vegetables (raw, steamed, roasted, or grilled). Low-sodium or reduced-sodium tomato and vegetable juice. Low-sodium or reduced-sodium tomato sauce and tomato paste. Low-sodium or reduced-sodium canned vegetables. Fruits All fresh, dried, or frozen fruit. Canned fruit in natural juice (without added sugar). Meat and other protein foods Skinless chicken or [place]. Ground chicken or [place]. Pork with fat trimmed off. Fish and seafood. Egg whites. Dried beans, peas, or lentils. Unsalted nuts, nut butters, and seeds. Unsalted canned beans. Lean cuts of beef with fat trimmed off. Low-sodium, lean deli meat. Dairy Low-fat (1%) or fat-free (skim) milk. Fat-free, low-fat, or reduced-fat cheeses. Nonfat, low-sodium ricotta or cottage cheese. Low-fat or nonfat yogurt. Low-fat, low-sodium cheese. Fats and oils Soft margarine without trans fats. Vegetable oil. Low-fat, reduced-fat, or light mayonnaise and  salad dressings (reduced-sodium). Canola, safflower, olive, soybean, and sunflower oils. Avocado. Seasoning and other foods Herbs. Spices. Seasoning mixes without salt. Unsalted popcorn and pretzels. Fat-free sweets. What foods are not recommended? The items listed may not be a complete list. Talk with your dietitian about what dietary choices are best for you. Grains Baked goods made with fat, such as croissants, muffins, or some breads. Dry pasta or rice meal packs. Vegetables Creamed or fried vegetables. Vegetables in a cheese sauce. Regular canned vegetables (not low-sodium or reduced-sodium). Regular canned tomato sauce and paste (not low-sodium or reduced-sodium). Regular tomato and vegetable juice (not low-sodium or reduced-sodium). Rosita Fire. Olives. Fruits Canned fruit in a light or heavy syrup. Fried fruit. Fruit in cream or butter sauce. Meat and other protein foods Fatty cuts of meat. Ribs. Fried meat. Tomasa Blase. Sausage. Bologna and other processed lunch meats. Salami. Fatback. Hotdogs. Bratwurst. Salted nuts and seeds. Canned beans with added salt. Canned or smoked fish. Whole eggs or egg yolks. Chicken or [place] with skin. Dairy Whole or 2% milk, cream, and half-and-half. Whole or full-fat cream cheese. Whole-fat or sweetened yogurt. Full-fat cheese. Nondairy creamers. Whipped toppings. Processed cheese and cheese spreads. Fats and oils Butter. Stick margarine. Lard. Shortening. Ghee. Bacon fat. Tropical oils, such as coconut, palm kernel, or palm oil. Seasoning and other foods Salted popcorn and pretzels. Onion salt, garlic salt, seasoned salt, table salt, and sea salt. Worcestershire sauce. Tartar sauce. Barbecue sauce. Teriyaki sauce. Soy sauce, including reduced-sodium. Steak sauce. Canned and packaged gravies. Fish sauce. Oyster sauce. Cocktail sauce. Horseradish that you find on the shelf. Ketchup. Mustard. Meat flavorings and tenderizers. Bouillon cubes. Hot sauce and Tabasco sauce.  Premade or packaged marinades. Premade or packaged taco seasonings. Relishes. Regular salad dressings. Where to find more information: National Heart, Lung, and Blood Institute: PopSteam.is American Heart Association: www.heart.org Summary The DASH eating plan is a healthy eating plan that has been shown to reduce high blood pressure (hypertension). It may also reduce your risk for type 2 diabetes, heart disease, and stroke. With the DASH eating plan, you should limit salt (sodium) intake to 2,300 mg a day. If you have hypertension, you may need to reduce your sodium intake to 1,500 mg a day. When on the DASH eating plan, aim to eat more fresh fruits and vegetables, whole grains, lean proteins, low-fat dairy, and heart-healthy fats. Work with your health care provider or diet and nutrition specialist (dietitian) to adjust your eating plan to your individual calorie needs. This information is not intended to replace advice given to you by your  health care provider. Make sure you discuss any questions you have with your health care provider. Document Revised: 03/04/2017 Document Reviewed: 03/15/2016 Elsevier Patient Education  2020 [employer].

## 2019-11-10 LAB — TSH: TSH: 2.75 mIU/L (ref 0.40–4.50)

## 2019-11-10 LAB — CBC WITH DIFFERENTIAL/PLATELET
Absolute Monocytes: 454 cells/uL (ref 200–950)
Basophils Absolute: 39 cells/uL (ref 0–200)
Basophils Relative: 0.7 %
Eosinophils Absolute: 230 cells/uL (ref 15–500)
Eosinophils Relative: 4.1 %
HCT: 38 % (ref 35.0–45.0)
Hemoglobin: 12.9 g/dL (ref 11.7–15.5)
Lymphs Abs: 1915 cells/uL (ref 850–3900)
MCH: 29.7 pg (ref 27.0–33.0)
MCHC: 33.9 g/dL (ref 32.0–36.0)
MCV: 87.6 fL (ref 80.0–100.0)
MPV: 9.8 fL (ref 7.5–12.5)
Monocytes Relative: 8.1 %
Neutro Abs: 2962 cells/uL (ref 1500–7800)
Neutrophils Relative %: 52.9 %
Platelets: 190 10*3/uL (ref 140–400)
RBC: 4.34 10*6/uL (ref 3.80–5.10)
RDW: 13.7 % (ref 11.0–15.0)
Total Lymphocyte: 34.2 %
WBC: 5.6 10*3/uL (ref 3.8–10.8)

## 2019-11-10 LAB — COMPLETE METABOLIC PANEL WITH GFR
AG Ratio: 1.5 (calc) (ref 1.0–2.5)
ALT: 20 U/L (ref 6–29)
AST: 23 U/L (ref 10–35)
Albumin: 3.9 g/dL (ref 3.6–5.1)
Alkaline phosphatase (APISO): 62 U/L (ref 37–153)
BUN: 11 mg/dL (ref 7–25)
CO2: 25 mmol/L (ref 20–32)
Calcium: 8.8 mg/dL (ref 8.6–10.4)
Chloride: 109 mmol/L (ref 98–110)
Creat: 0.64 mg/dL (ref 0.50–1.05)
GFR, Est African American: 113 mL/min/{1.73_m2} (ref >=60)
GFR, Est Non African American: 98 mL/min/{1.73_m2} (ref >=60)
Globulin: 2.6 g/dL (calc) (ref 1.9–3.7)
Glucose, Bld: 112 mg/dL — ABNORMAL HIGH (ref 65–99)
Potassium: 3.4 mmol/L — ABNORMAL LOW (ref 3.5–5.3)
Sodium: 143 mmol/L (ref 135–146)
Total Bilirubin: 0.9 mg/dL (ref 0.2–1.2)
Total Protein: 6.5 g/dL (ref 6.1–8.1)

## 2019-11-10 LAB — BRAIN NATRIURETIC PEPTIDE: Brain Natriuretic Peptide: 62 pg/mL (ref <100)

## 2019-11-19 ENCOUNTER — Encounter: Payer: Self-pay | Admitting: Family Medicine

## 2019-11-27 ENCOUNTER — Ambulatory Visit (INDEPENDENT_AMBULATORY_CARE_PROVIDER_SITE_OTHER): Payer: 59 | Admitting: Endocrinology

## 2019-11-27 ENCOUNTER — Other Ambulatory Visit: Payer: Self-pay

## 2019-11-27 ENCOUNTER — Encounter: Payer: Self-pay | Admitting: Endocrinology

## 2019-11-27 VITALS — BP 140/86 | HR 68 | Ht 62.0 in | Wt 240.4 lb

## 2019-11-27 DIAGNOSIS — E119 Type 2 diabetes mellitus without complications: Secondary | ICD-10-CM

## 2019-11-27 DIAGNOSIS — Z794 Long term (current) use of insulin: Secondary | ICD-10-CM

## 2019-11-27 LAB — POCT GLYCOSYLATED HEMOGLOBIN (HGB A1C): Hemoglobin A1C: 7.3 % — AB (ref 4.0–5.6)

## 2019-11-27 MED ORDER — CONTOUR NEXT TEST VI STRP: 1.0000 | ORAL_STRIP | Freq: Every day | 0 refills | Status: DC

## 2019-11-27 MED ORDER — TRULICITY 0.75 MG/0.5ML ~~LOC~~ SOAJ: 0.7500 mg | SUBCUTANEOUS | 3 refills | Status: DC

## 2019-11-27 NOTE — Progress Notes (Signed)
Subjective:    [name], female    DOB: Apr 20, 1960, 59 y.o.   MRN: [id number]  HPI Pt returns for f/u of diabetes mellitus: DM type: Insulin-requiring type 2.  Dx'ed: 2004 Complications: none Therapy: insulin since 2018 GDM: 1988 DKA: never Severe hypoglycemia: never Pancreatitis: never Pancreatic imaging: normal on 2013 CT Other: she did not tolerate Invokana (vaginitis); she stopped victoza, due to cost; she takes multiple daily injections Interval history: Pt says she takes insulin as rx'ed.  no cbg record, but states cbg's vary from 80-150.  She has also been taking Saxenda, from husband's supply, but she says this is inconsistently available to her.    Past Medical History:  Diagnosis Date  . Allergy   . Anxiety 2013  . Asthma 2015  . Asthma   . Depression 2013  . Diabetes mellitus 2004  . Finding of multiple premature atrial contractions by electrocardiography   . GERD (gastroesophageal reflux disease) 2013  . Hyperlipidemia 1992  . Hypertension 1992  . Memory impairment     Past Surgical History:  Procedure Laterality Date  . ANTERIOR CRUCIATE LIGAMENT REPAIR  1997  . APPENDECTOMY  1987  . CARDIAC EVENT MONITOR  03/2019   No average rate 60 bpm.  Max HR 122, minimum HR 36 bpm-sinus bradycardia with PACs and bigeminy.  Frequent PACs seen as isolated, couplets, triplets as well as frequent bigeminy.Clovis Cao SECTION     61,44,31,54  . CHOLECYSTECTOMY  1987  . NM MYOVIEW LTD  05/2019   Normal EF 60%.  Poor control hypertension.  (194/101 mmHg).  Small size moderate severity fixed defect in the mid anterior and apical anterior wall likely representing breast attenuation artifact.  No evidence of ischemia (reversibility).  LOW RISK  . TRANSTHORACIC ECHOCARDIOGRAM  03/2019   EF 55-60 % with normal wall motion.  Slight LA dilation with grade 1 diastolic function.  Moderate aortic valve calcification-sclerosis without stenosis.  Otherwise normal.    . uterine ablation      Social History   Socioeconomic History  . Marital status: Married    Spouse name: Not on file  . Number of children: Not on file  . Years of education: Not on file  . Highest education level: Not on file  Occupational History  . Not on file  Tobacco Use  . Smoking status: Never Smoker  . Smokeless tobacco: Never Used  Substance and Sexual Activity  . Alcohol use: No  . Drug use: No  . Sexual activity: Yes  Other Topics Concern  . Not on file  Social History Narrative   Married.    Lives with husband and youngest son, who is 1 y/o.   Has 4 children.   Does not work outside of home.    No formal exercise. Only activity around the house.   Tries to be careful with Diabetic diet.   Social Determinants of Health   Financial Resource Strain:   . Difficulty of Paying Living Expenses: Not on file  Food Insecurity:   . Worried About Programme researcher, broadcasting/film/video in the Last Year: Not on file  . Ran Out of Food in the Last Year: Not on file  Transportation Needs:   . Lack of Transportation (Medical): Not on file  . Lack of Transportation (Non-Medical): Not on file  Physical Activity:   . Days of Exercise per Week: Not on file  . Minutes of Exercise per Session: Not on file  Stress:   . Feeling of Stress : Not on file  Social Connections:   . Frequency of Communication with Friends and Family: Not on file  . Frequency of Social Gatherings with Friends and Family: Not on file  . Attends Religious Services: Not on file  . Active Member of Clubs or Organizations: Not on file  . Attends Banker Meetings: Not on file  . Marital Status: Not on file  Intimate Partner Violence:   . Fear of Current or Ex-Partner: Not on file  . Emotionally Abused: Not on file  . Physically Abused: Not on file  . Sexually Abused: Not on file    Current Outpatient Medications on File Prior to Visit  Medication Sig Dispense Refill  . aspirin 81 MG tablet Take 1  tablet (81 mg total) by mouth daily. 30 tablet 11  . [facility] D3 2000 units CAPS Take 2 capsules (4,000 Units total) by mouth daily. 30 each 3  . FLUoxetine (PROZAC) 20 MG tablet TAKE 1 TABLET BY MOUTH EVERY DAY 90 tablet 2  . furosemide (LASIX) 20 MG tablet Take 1 tablet (20 mg total) by mouth daily as needed for fluid or edema. 30 tablet 0  . insulin glargine (LANTUS SOLOSTAR) 100 UNIT/ML Solostar Pen Inject 130 Units into the skin every morning. 15 pen 11  . Insulin Pen Needle (PEN NEEDLES) 30G X 8 MM MISC 1 each by Does not apply route 4 (four) times daily. E11.9 200 each 1  . lisinopril-hydrochlorothiazide (ZESTORETIC) 20-12.5 MG tablet Take 1 tablet by mouth daily. 90 tablet 3  . LORazepam (ATIVAN) 1 MG tablet Take 0.5-1 tablets (0.5-1 mg total) by mouth 2 (two) times daily as needed for anxiety or sleep (use sparingly). 60 tablet 0  . metoprolol succinate (TOPROL-XL) 25 MG 24 hr tablet Take 1 tablet (25 mg total) by mouth daily. 90 tablet 2  . Microlet Lancets MISC 1 each by Does not apply route daily. E11.9    . pantoprazole (PROTONIX) 40 MG tablet Take 1 tablet (40 mg total) by mouth daily. 90 tablet 3  . potassium chloride SA (KLOR-CON) 20 MEQ tablet Take one tablet PO once daily when taking lasix 30 tablet 0  . rosuvastatin (CRESTOR) 40 MG tablet Take 1 tablet (40 mg total) by mouth daily. 90 tablet 3  . tiZANidine (ZANAFLEX) 4 MG tablet SMARTSIG:3 Tablet(s) By Mouth Every Other Day PRN    . traZODone (DESYREL) 50 MG tablet TAKE 1/2 UP TO 2 TABLETS BY MOUTH AT BEDTIME AS NEEDED FOR SLEEP 180 tablet 2   No current facility-administered medications on file prior to visit.    Allergies  Allergen Reactions  . Contrast Media [Iodinated Diagnostic Agents] Anaphylaxis  . Ambien [Zolpidem Tartrate] Other (See Comments)    Family has told her that after taking Ambien, she does "crazy things" that she does not remember doing.   . Codeine Nausea And Vomiting    Family History  Problem Relation  Age of Onset  . Cancer Father        Lymphoma, Leukemia  . Depression Father   . Cancer Maternal Grandfather   . Cancer Paternal Grandmother   . Diabetes Paternal Grandfather   . Kidney disease Paternal Grandfather     BP 140/86   Pulse 68   Ht 5\' 2"  (1.575 m)   Wt 240 lb 6.4 oz (109 kg)   SpO2 97%   BMI 43.97 kg/m    Review of Systems She denies hypoglycemia.  Objective:   Physical Exam VITAL SIGNS:  See vs page GENERAL: no distress Pulses: dorsalis pedis intact bilat.   MSK: no deformity of the feet CV: trace bilat leg edema Skin:  no ulcer on the feet.  normal color and temp on the feet. Neuro: sensation is intact to touch on the feet  Lab Results  Component Value Date   HGBA1C 7.3 (A) 11/27/2019       Assessment & Plan:  Insulin-requiring type 2 DM: this is the best control this pt should aim for, given this regimen, which does match insulin to her changing needs throughout the day Medication error: I offered to rx GLP instead of Saxenda. She agrees.   Patient Instructions  check your blood sugar twice a day.  vary the time of day when you check, between before the 3 meals, and at bedtime.  also check if you have symptoms of your blood sugar being too high or too low.  please keep a record of the readings and bring it to your next appointment here (or you can bring the meter itself).  You can write it on any piece of paper.  please call us sooner if your blood sugar goes below 70, or if you have a lot of readings over 200. On this type of insulin schedule, you should eat meals on a regular schedule.  If a meal is missed or significantly delayed, your blood sugar could go low.  I have sent a prescription to your pharmacy, to change the Saxenda to Trulicity, and:  Please continue the same Lantus. Please come back for a follow-up appointment in 2 months.

## 2019-11-27 NOTE — Patient Instructions (Addendum)
check your blood sugar twice a day.  vary the time of day when you check, between before the 3 meals, and at bedtime.  also check if you have symptoms of your blood sugar being too high or too low.  please keep a record of the readings and bring it to your next appointment here (or you can bring the meter itself).  You can write it on any piece of paper.  please call us sooner if your blood sugar goes below 70, or if you have a lot of readings over 200. On this type of insulin schedule, you should eat meals on a regular schedule.  If a meal is missed or significantly delayed, your blood sugar could go low.  I have sent a prescription to your pharmacy, to change the Saxenda to Trulicity, and:  Please continue the same Lantus. Please come back for a follow-up appointment in 2 months.

## 2019-12-01 ENCOUNTER — Other Ambulatory Visit: Payer: Self-pay | Admitting: Family Medicine

## 2019-12-01 ENCOUNTER — Other Ambulatory Visit: Payer: Self-pay | Admitting: Endocrinology

## 2019-12-01 DIAGNOSIS — E1165 Type 2 diabetes mellitus with hyperglycemia: Secondary | ICD-10-CM

## 2019-12-01 DIAGNOSIS — R0602 Shortness of breath: Secondary | ICD-10-CM

## 2019-12-01 DIAGNOSIS — R600 Localized edema: Secondary | ICD-10-CM

## 2019-12-01 MED ORDER — LANTUS SOLOSTAR 100 UNIT/ML ~~LOC~~ SOPN: 130.0000 [IU] | PEN_INJECTOR | SUBCUTANEOUS | 3 refills | Status: DC

## 2019-12-03 ENCOUNTER — Encounter: Payer: Self-pay | Admitting: Family Medicine

## 2019-12-05 ENCOUNTER — Other Ambulatory Visit: Payer: Self-pay | Admitting: Family Medicine

## 2019-12-05 DIAGNOSIS — F411 Generalized anxiety disorder: Secondary | ICD-10-CM

## 2019-12-11 ENCOUNTER — Other Ambulatory Visit: Payer: Self-pay | Admitting: Family Medicine

## 2019-12-11 DIAGNOSIS — F411 Generalized anxiety disorder: Secondary | ICD-10-CM

## 2019-12-11 NOTE — Telephone Encounter (Signed)
Please review for refill. Refill not delegated per protocol.  Last refill: 06/28/19 #[id number]

## 2019-12-11 NOTE — Telephone Encounter (Signed)
Copied from CRM [id number]. Topic: Quick Communication - Rx Refill/Question >> Dec 11, 2019  3:10 PM Jaquita Rector A wrote: Medication: LORazepam (ATIVAN) 1 MG tablet   Has the patient contacted their pharmacy? Yes.   (Agent: If no, request that the patient contact the pharmacy for the refill.) (Agent: If yes, when and what did the pharmacy advise?)  Preferred Pharmacy (with phone number or street name): [id number] [place], [place] - [address]  Phone:  [phone] Fax:  [phone]     Agent: Please be advised that RX refills may take up to 3 business days. We ask that you follow-up with your pharmacy.

## 2019-12-13 MED ORDER — LORAZEPAM 1 MG PO TABS: 0.5000 mg | ORAL_TABLET | Freq: Two times a day (BID) | ORAL | 0 refills | Status: DC | PRN

## 2020-01-29 ENCOUNTER — Ambulatory Visit: Payer: 59 | Admitting: Endocrinology

## 2020-02-01 ENCOUNTER — Telehealth: Payer: Self-pay | Admitting: Family Medicine

## 2020-02-01 DIAGNOSIS — F411 Generalized anxiety disorder: Secondary | ICD-10-CM

## 2020-02-01 DIAGNOSIS — F410 Panic disorder [episodic paroxysmal anxiety] without agoraphobia: Secondary | ICD-10-CM

## 2020-02-04 NOTE — Telephone Encounter (Signed)
Pt has her 6 mth fu in January

## 2020-02-11 ENCOUNTER — Other Ambulatory Visit: Payer: Self-pay | Admitting: Family Medicine

## 2020-02-11 ENCOUNTER — Other Ambulatory Visit: Payer: Self-pay | Admitting: Cardiology

## 2020-02-11 DIAGNOSIS — R0602 Shortness of breath: Secondary | ICD-10-CM

## 2020-02-11 DIAGNOSIS — R002 Palpitations: Secondary | ICD-10-CM

## 2020-02-11 DIAGNOSIS — R600 Localized edema: Secondary | ICD-10-CM

## 2020-02-11 DIAGNOSIS — E785 Hyperlipidemia, unspecified: Secondary | ICD-10-CM

## 2020-02-11 DIAGNOSIS — I491 Atrial premature depolarization: Secondary | ICD-10-CM

## 2020-02-14 ENCOUNTER — Other Ambulatory Visit: Payer: Self-pay

## 2020-02-14 ENCOUNTER — Encounter: Payer: Self-pay | Admitting: Endocrinology

## 2020-02-14 ENCOUNTER — Ambulatory Visit (INDEPENDENT_AMBULATORY_CARE_PROVIDER_SITE_OTHER): Payer: 59 | Admitting: Endocrinology

## 2020-02-14 VITALS — BP 114/68 | HR 68 | Ht 62.0 in | Wt 241.0 lb

## 2020-02-14 DIAGNOSIS — E119 Type 2 diabetes mellitus without complications: Secondary | ICD-10-CM

## 2020-02-14 DIAGNOSIS — E1165 Type 2 diabetes mellitus with hyperglycemia: Secondary | ICD-10-CM | POA: Diagnosis not present

## 2020-02-14 DIAGNOSIS — I10 Essential (primary) hypertension: Secondary | ICD-10-CM

## 2020-02-14 DIAGNOSIS — Z794 Long term (current) use of insulin: Secondary | ICD-10-CM | POA: Diagnosis not present

## 2020-02-14 LAB — POCT GLYCOSYLATED HEMOGLOBIN (HGB A1C): Hemoglobin A1C: 7.5 % — AB (ref 4.0–5.6)

## 2020-02-14 MED ORDER — LANTUS SOLOSTAR 100 UNIT/ML ~~LOC~~ SOPN: 110.0000 [IU] | PEN_INJECTOR | SUBCUTANEOUS | 3 refills | Status: DC

## 2020-02-14 MED ORDER — COSYNTROPIN 0.25 MG IJ SOLR
0.2500 mg | Freq: Once | INTRAMUSCULAR | Status: AC
  Administered 2020-02-14: 0.25 mg via INTRAVENOUS

## 2020-02-14 MED ORDER — TRULICITY 1.5 MG/0.5ML ~~LOC~~ SOAJ: 1.5000 mg | SUBCUTANEOUS | 3 refills | Status: DC

## 2020-02-14 NOTE — Progress Notes (Signed)
Subjective:    [name], female    DOB: 21-Dec-1960, 59 y.o.   MRN: [id number]  HPI Pt returns for f/u of diabetes mellitus: DM type: Insulin-requiring type 2.  Dx'ed: 2004 Complications: none Therapy: insulin since 2018 GDM: 1988 DKA: never Severe hypoglycemia: never Pancreatitis: never Pancreatic imaging: normal on 2013 CT Other: she did not tolerate Invokana (vaginitis); she stopped victoza, due to cost; she takes multiple daily injections.  Interval history: Pt says she takes insulin as rx'ed.  no cbg record, but states cbg's vary from 80-150.  She seldom has hypoglycemia, and these episodes are mild.  Past Medical History:  Diagnosis Date  . Allergy   . Anxiety 2013  . Asthma 2015  . Asthma   . Depression 2013  . Diabetes mellitus 2004  . Finding of multiple premature atrial contractions by electrocardiography   . GERD (gastroesophageal reflux disease) 2013  . Hyperlipidemia 1992  . Hypertension 1992  . Memory impairment     Past Surgical History:  Procedure Laterality Date  . ANTERIOR CRUCIATE LIGAMENT REPAIR  1997  . APPENDECTOMY  1987  . CARDIAC EVENT MONITOR  03/2019   No average rate 60 bpm.  Max HR 122, minimum HR 36 bpm-sinus bradycardia with PACs and bigeminy.  Frequent PACs seen as isolated, couplets, triplets as well as frequent bigeminy.Clovis Cao SECTION     30,07,62,26  . CHOLECYSTECTOMY  1987  . NM MYOVIEW LTD  05/2019   Normal EF 60%.  Poor control hypertension.  (194/101 mmHg).  Small size moderate severity fixed defect in the mid anterior and apical anterior wall likely representing breast attenuation artifact.  No evidence of ischemia (reversibility).  LOW RISK  . TRANSTHORACIC ECHOCARDIOGRAM  03/2019   EF 55-60 % with normal wall motion.  Slight LA dilation with grade 1 diastolic function.  Moderate aortic valve calcification-sclerosis without stenosis.  Otherwise normal.  . uterine ablation      Social History    Socioeconomic History  . Marital status: Married    Spouse name: Not on file  . Number of children: Not on file  . Years of education: Not on file  . Highest education level: Not on file  Occupational History  . Not on file  Tobacco Use  . Smoking status: Never Smoker  . Smokeless tobacco: Never Used  Substance and Sexual Activity  . Alcohol use: No  . Drug use: No  . Sexual activity: Yes  Other Topics Concern  . Not on file  Social History Narrative   Married.    Lives with husband and youngest son, who is 85 y/o.   Has 4 children.   Does not work outside of home.    No formal exercise. Only activity around the house.   Tries to be careful with Diabetic diet.   Social Determinants of Health   Financial Resource Strain:   . Difficulty of Paying Living Expenses: Not on file  Food Insecurity:   . Worried About Programme researcher, broadcasting/film/video in the Last Year: Not on file  . Ran Out of Food in the Last Year: Not on file  Transportation Needs:   . Lack of Transportation (Medical): Not on file  . Lack of Transportation (Non-Medical): Not on file  Physical Activity:   . Days of Exercise per Week: Not on file  . Minutes of Exercise per Session: Not on file  Stress:   . Feeling of Stress : Not on file  Social Connections:   . Frequency of Communication with Friends and Family: Not on file  . Frequency of Social Gatherings with Friends and Family: Not on file  . Attends Religious Services: Not on file  . Active Member of Clubs or Organizations: Not on file  . Attends Banker Meetings: Not on file  . Marital Status: Not on file  Intimate Partner Violence:   . Fear of Current or Ex-Partner: Not on file  . Emotionally Abused: Not on file  . Physically Abused: Not on file  . Sexually Abused: Not on file    Current Outpatient Medications on File Prior to Visit  Medication Sig Dispense Refill  . aspirin 81 MG tablet Take 1 tablet (81 mg total) by mouth daily. 30 tablet  11  . [facility] D3 2000 units CAPS Take 2 capsules (4,000 Units total) by mouth daily. 30 each 3  . FLUoxetine (PROZAC) 20 MG tablet TAKE 1 TABLET BY MOUTH EVERY DAY 90 tablet 2  . furosemide (LASIX) 20 MG tablet Take 1 tablet (20 mg total) by mouth daily as needed for fluid or edema. 30 tablet 0  . glucose blood (CONTOUR NEXT TEST) test strip 1 each by Other route daily. E11.9 100 each 0  . Insulin Pen Needle (PEN NEEDLES) 30G X 8 MM MISC 1 each by Does not apply route 4 (four) times daily. E11.9 200 each 1  . LORazepam (ATIVAN) 1 MG tablet TAKE 1/2 OR 1 TABLET BY MOUTH 2 TIMES DAILY AS NEEDED FOR ANXIETY OR SLEEP (USE SPARINGLY). 60 tablet 0  . metoprolol succinate (TOPROL-XL) 25 MG 24 hr tablet TAKE 1 TABLET BY MOUTH EVERY DAY 90 tablet 2  . Microlet Lancets MISC 1 each by Does not apply route daily. E11.9    . pantoprazole (PROTONIX) 40 MG tablet Take 1 tablet (40 mg total) by mouth daily. 90 tablet 3  . potassium chloride SA (KLOR-CON) 20 MEQ tablet Take one tablet PO once daily when taking lasix 30 tablet 0  . tiZANidine (ZANAFLEX) 4 MG tablet SMARTSIG:3 Tablet(s) By Mouth Every Other Day PRN    . traZODone (DESYREL) 50 MG tablet TAKE 1/2 UP TO 2 TABLETS BY MOUTH AT BEDTIME AS NEEDED FOR SLEEP 180 tablet 2  . rosuvastatin (CRESTOR) 40 MG tablet TAKE 1 TABLET BY MOUTH EVERY DAY 90 tablet 3   No current facility-administered medications on file prior to visit.    Allergies  Allergen Reactions  . Contrast Media [Iodinated Diagnostic Agents] Anaphylaxis  . Ambien [Zolpidem Tartrate] Other (See Comments)    Family has told her that after taking Ambien, she does "crazy things" that she does not remember doing.   . Codeine Nausea And Vomiting    Family History  Problem Relation Age of Onset  . Cancer Father        Lymphoma, Leukemia  . Depression Father   . Cancer Maternal Grandfather   . Cancer Paternal Grandmother   . Diabetes Paternal Grandfather   . Kidney disease Paternal Grandfather      BP 114/68   Pulse 68   Ht 5\' 2"  (1.575 m)   Wt 241 lb (109.3 kg)   SpO2 98%   BMI 44.08 kg/m    Review of Systems She denies hypoglycemia/n/v    Objective:   Physical Exam VITAL SIGNS:  See vs page GENERAL: no distress Pulses: dorsalis pedis intact bilat.   MSK: no deformity of the feet CV: no leg edema Skin:  no ulcer  on the feet.  normal color and temp on the feet. Neuro: sensation is intact to touch on the feet  Lab Results  Component Value Date   HGBA1C 7.5 (A) 02/14/2020        Assessment & Plan:  Insulin-requiring type 2 DM Hypoglycemia, due to insulin: this limits aggressiveness of glycemic control  Patient Instructions  check your blood sugar twice a day.  vary the time of day when you check, between before the 3 meals, and at bedtime.  also check if you have symptoms of your blood sugar being too high or too low.  please keep a record of the readings and bring it to your next appointment here (or you can bring the meter itself).  You can write it on any piece of paper.  please call us sooner if your blood sugar goes below 70, or if you have a lot of readings over 200. On this type of insulin schedule, you should eat meals on a regular schedule.  If a meal is missed or significantly delayed, your blood sugar could go low.  I have sent a prescription to your pharmacy, to increase Trulicity, and:  Please reduce the Lantus to 110 units each morning Please come back for a follow-up appointment in 2 months.

## 2020-02-14 NOTE — Telephone Encounter (Signed)
Copied from CRM [id number]. Topic: Quick Communication - See Telephone Encounter >> Feb 14, 2020  4:21 PM Aretta Nip wrote: CRM for notification. See Telephone encounter for: 02/14/20.lisinopril-hydrochlorothiazide (ZESTORETIC) 20-12.5 MG tablet Medication Date: 10/11/2018 Department: [facility] Ordering/Authorizing: Danelle Berry, PA-C  Pt had several meds filled but this was turned down..Wanted Tapia or nurse to call back on refusals, but pt also stated at another dr appt today her pt was 114/60 and wanting to know if too low. FU at [phone]

## 2020-02-14 NOTE — Patient Instructions (Addendum)
check your blood sugar twice a day.  vary the time of day when you check, between before the 3 meals, and at bedtime.  also check if you have symptoms of your blood sugar being too high or too low.  please keep a record of the readings and bring it to your next appointment here (or you can bring the meter itself).  You can write it on any piece of paper.  please call us sooner if your blood sugar goes below 70, or if you have a lot of readings over 200. On this type of insulin schedule, you should eat meals on a regular schedule.  If a meal is missed or significantly delayed, your blood sugar could go low.  I have sent a prescription to your pharmacy, to increase Trulicity, and:  Please reduce the Lantus to 110 units each morning Please come back for a follow-up appointment in 2 months.

## 2020-02-15 MED ORDER — LISINOPRIL-HYDROCHLOROTHIAZIDE 20-12.5 MG PO TABS: 1.0000 | ORAL_TABLET | Freq: Every day | ORAL | 1 refills | Status: DC

## 2020-02-15 NOTE — Telephone Encounter (Signed)
It is ready for you to sign

## 2020-02-15 NOTE — Telephone Encounter (Signed)
Please advise 

## 2020-02-29 IMAGING — CR DG LUMBAR SPINE COMPLETE 4+V
5 series · 5 of 5 positions shown · non-contrast
Comparison: CT scan of February 26, 2013.

CLINICAL DATA: Chronic low back pain without known injury.

EXAM:
LUMBAR SPINE - COMPLETE 4+ VIEW

[t l-spine a.p.]
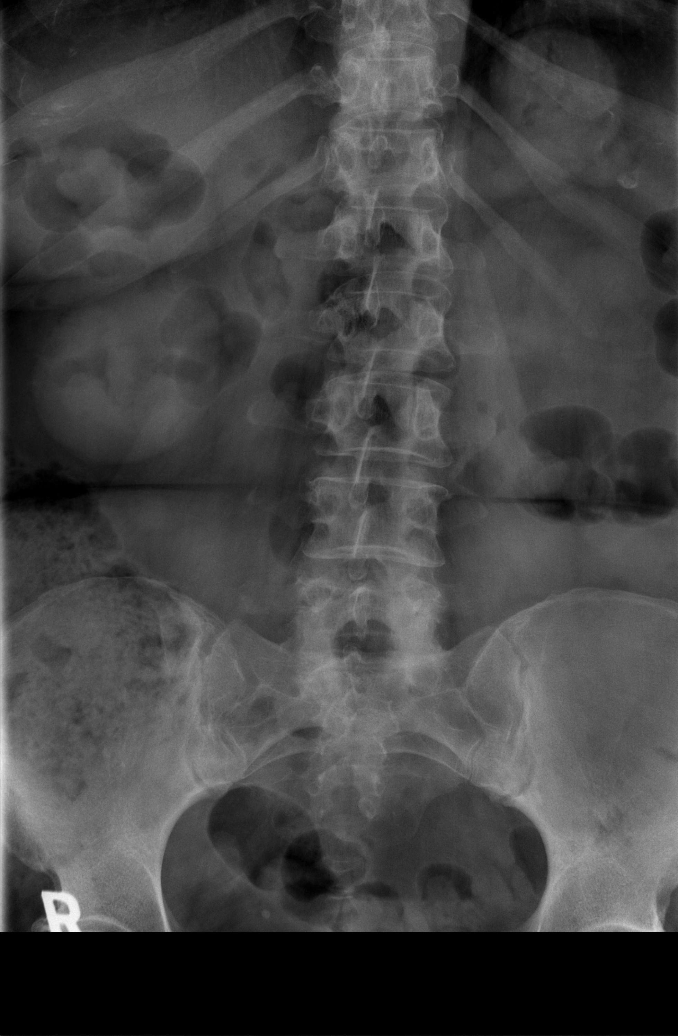

[t l-spine oblique exposure (1 of 2)]
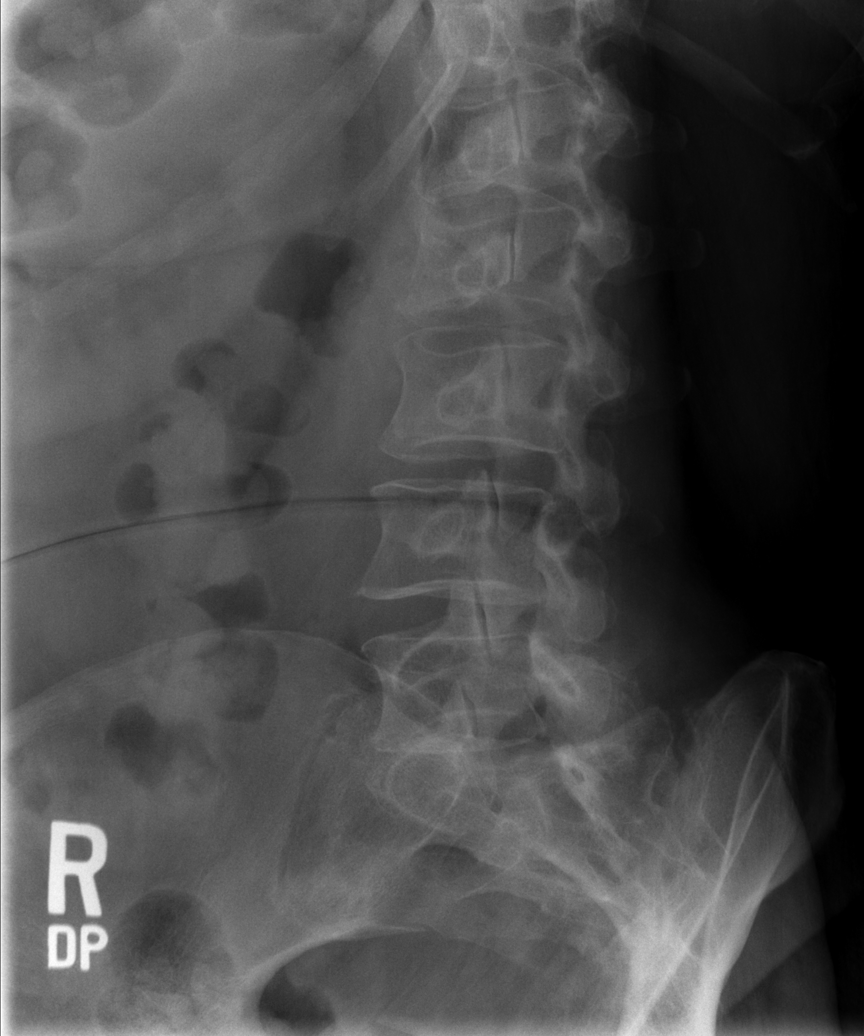

[t l-spine oblique exposure (2 of 2)]
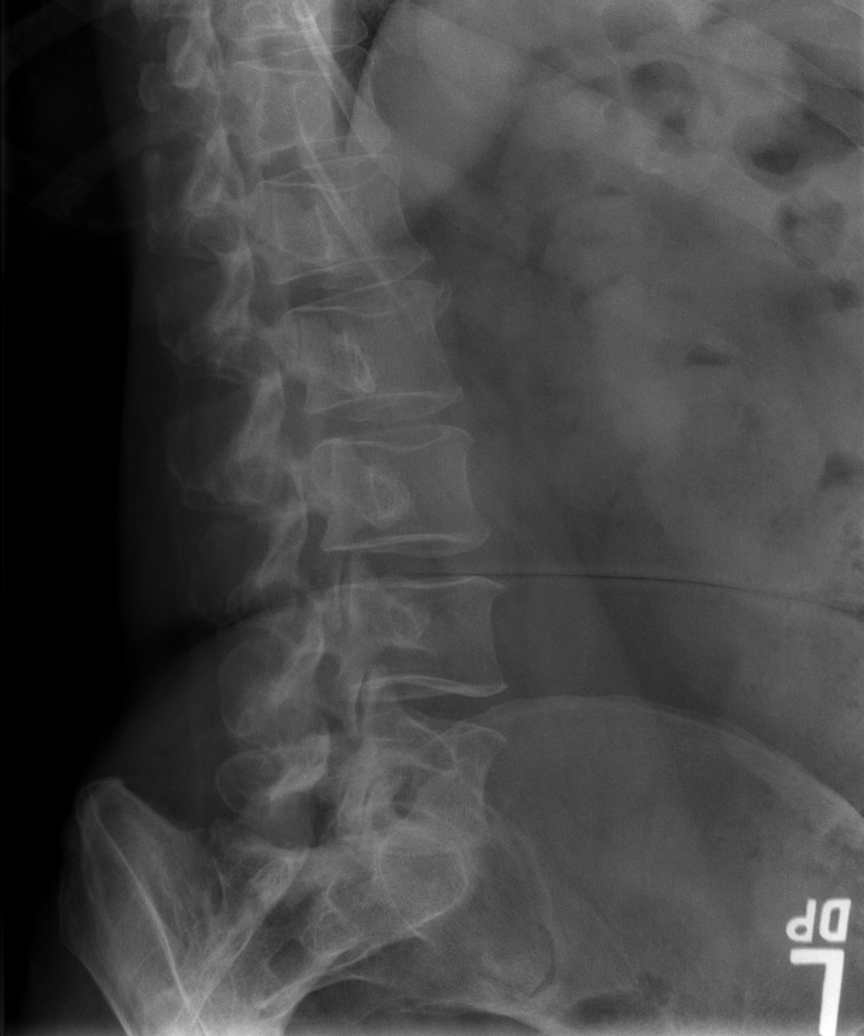

[t l-spine lat]
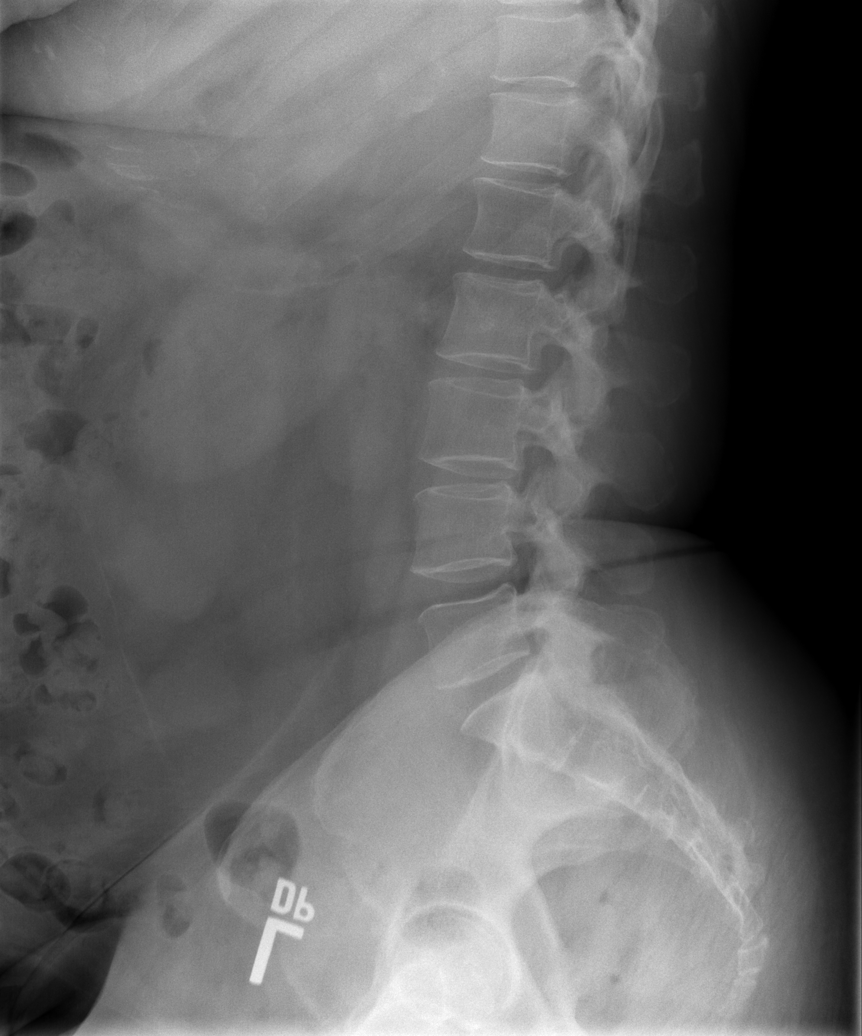

[t l-spine l5-s1 spot]
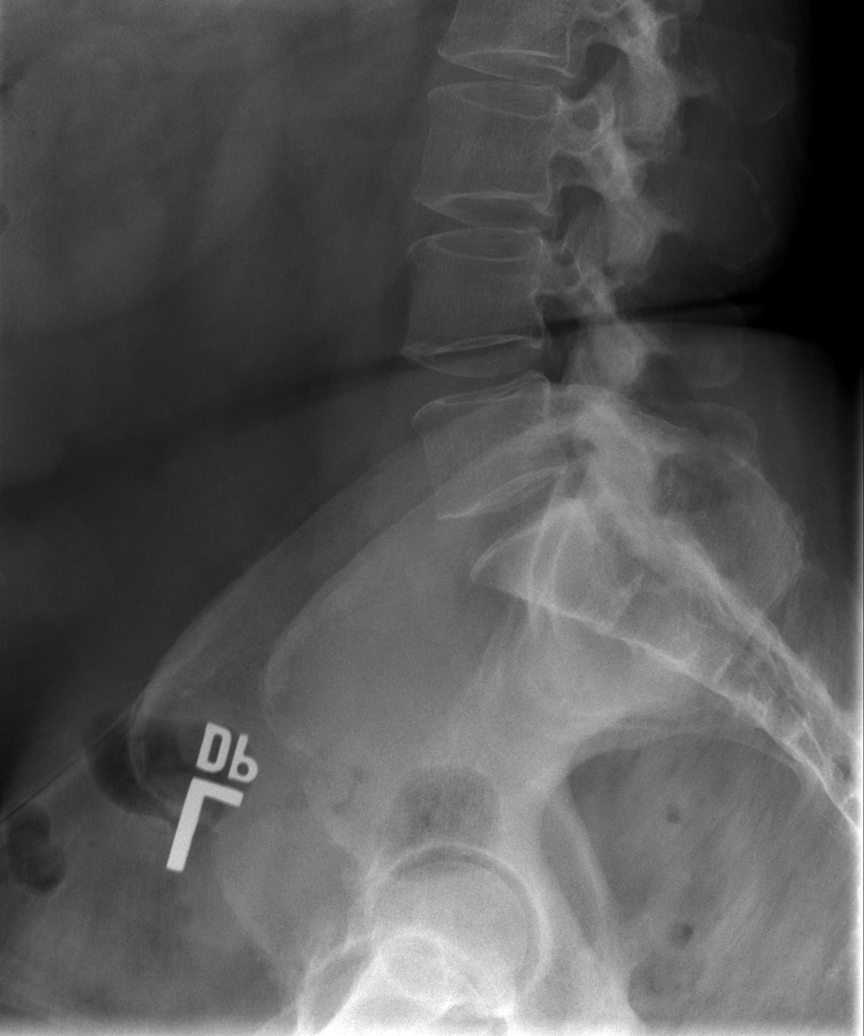

[5 of 5 positions shown; findings below may reference images not displayed]

FINDINGS: There is no evidence of lumbar spine fracture. Alignment is normal.
Intervertebral disc spaces are maintained. Rounded shadow is noted
in right upper quadrant.
IMPRESSION: Normal lumbar spine.

Rounded density is seen in right upper quadrant which may represent
artifact, but ultrasound is recommended to rule out renal mass.

## 2020-02-29 IMAGING — CR DG ABDOMEN 1V
1 series · 1 of 1 positions shown · non-contrast
Comparison: CT scan of February 27, 2013.

CLINICAL DATA: Nausea, vomiting.

EXAM:
ABDOMEN - 1 VIEW

[t abdomen supine]
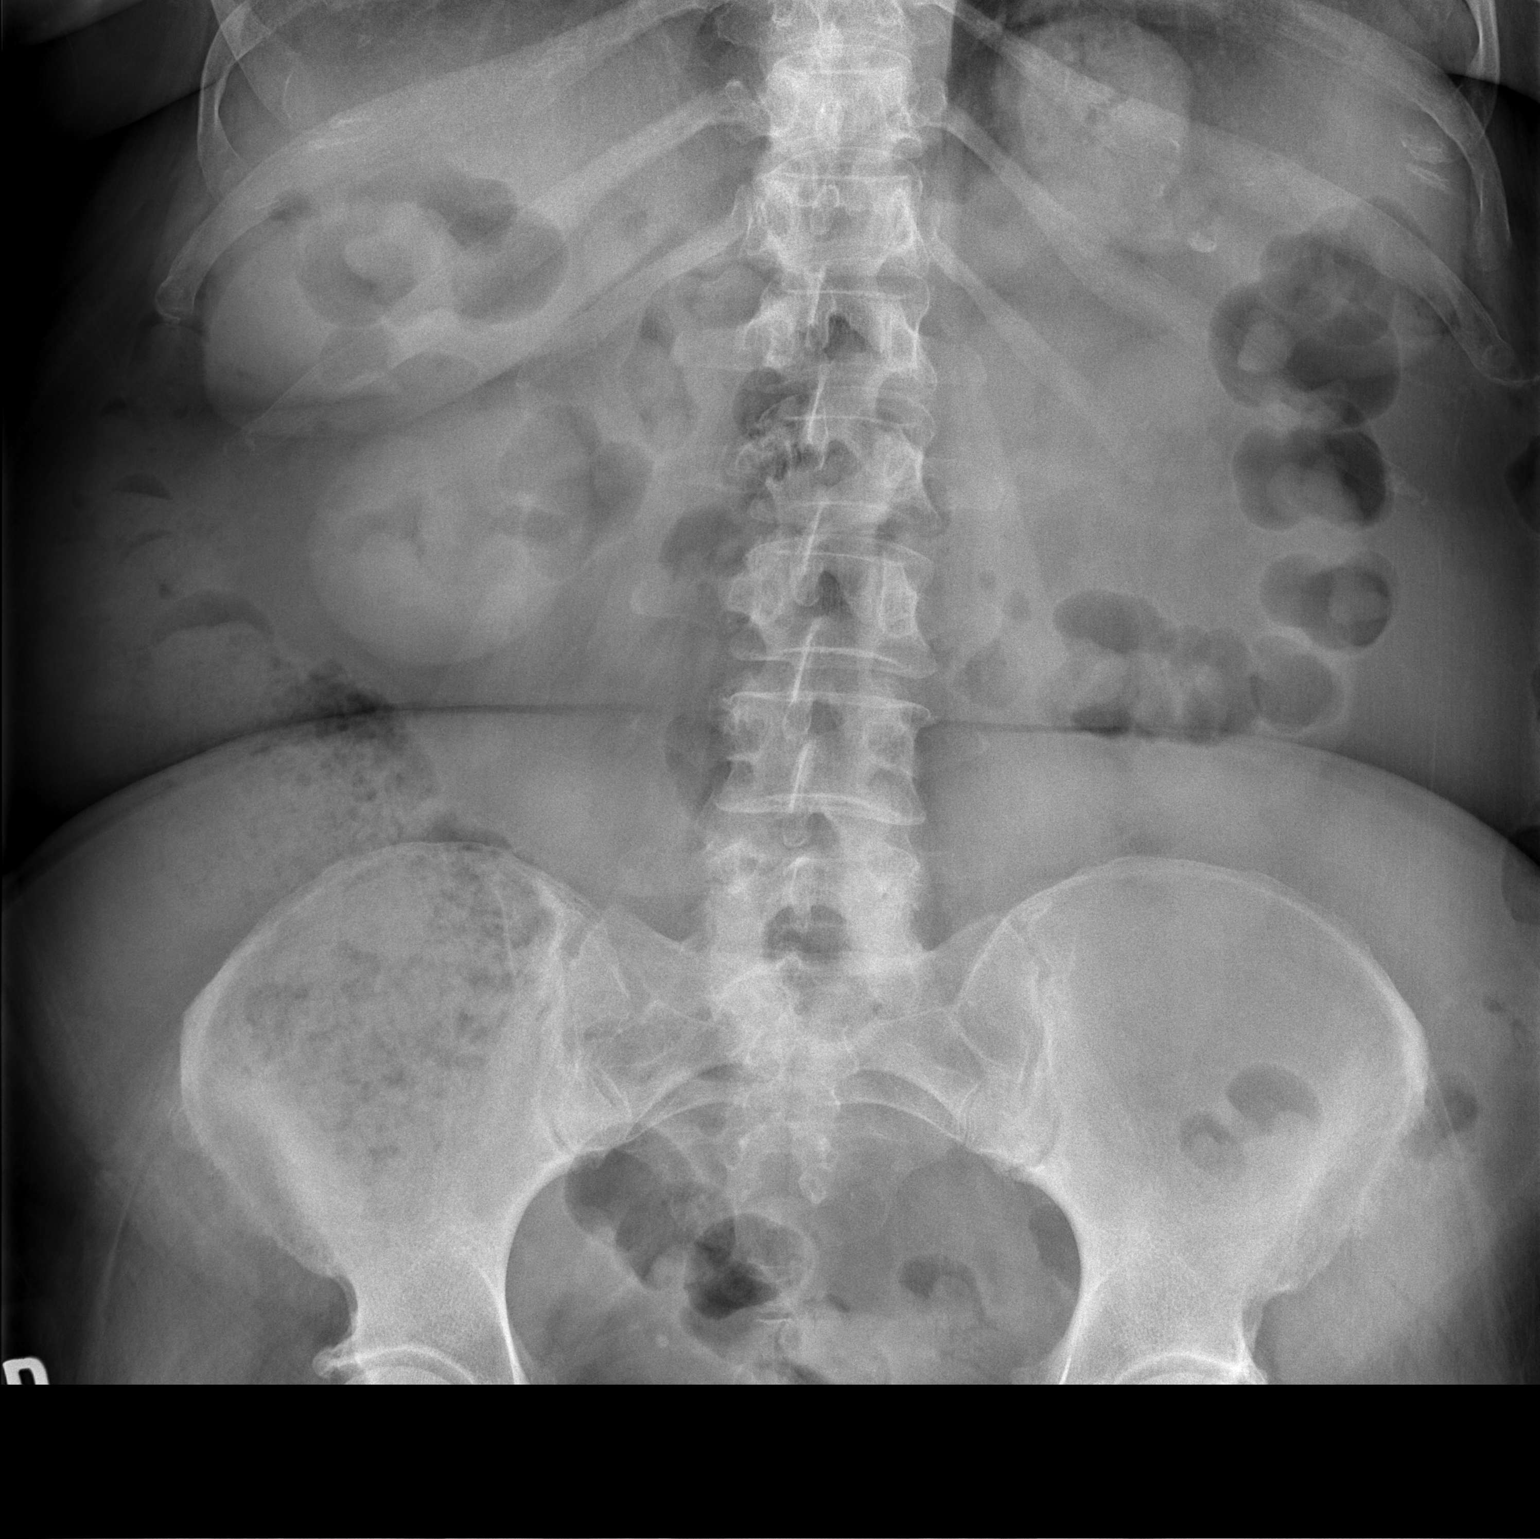

[1 of 1 positions shown; findings below may reference images not displayed]

FINDINGS: The bowel gas pattern is normal. Phlebolith is noted in the pelvis.
No definite nephrolithiasis is noted rounded abnormality is noted in
right upper quadrant which most likely represents kidney or renal
cyst, but renal ultrasound is recommended to rule out renal mass..
IMPRESSION: No evidence of bowel obstruction or ileus. Rounded shadow seen in
right upper quadrant which most likely represents kidney, but renal
ultrasound is recommended to rule out mass.

## 2020-03-17 ENCOUNTER — Other Ambulatory Visit: Payer: Self-pay | Admitting: Family Medicine

## 2020-03-17 ENCOUNTER — Other Ambulatory Visit: Payer: Self-pay | Admitting: Endocrinology

## 2020-03-17 DIAGNOSIS — G4700 Insomnia, unspecified: Secondary | ICD-10-CM

## 2020-03-17 DIAGNOSIS — E1165 Type 2 diabetes mellitus with hyperglycemia: Secondary | ICD-10-CM

## 2020-03-17 DIAGNOSIS — R0602 Shortness of breath: Secondary | ICD-10-CM

## 2020-03-17 DIAGNOSIS — R600 Localized edema: Secondary | ICD-10-CM

## 2020-03-27 ENCOUNTER — Other Ambulatory Visit: Payer: Self-pay | Admitting: Family Medicine

## 2020-03-27 DIAGNOSIS — F410 Panic disorder [episodic paroxysmal anxiety] without agoraphobia: Secondary | ICD-10-CM

## 2020-04-04 ENCOUNTER — Other Ambulatory Visit: Payer: Self-pay | Admitting: Family Medicine

## 2020-04-04 DIAGNOSIS — R600 Localized edema: Secondary | ICD-10-CM

## 2020-04-04 DIAGNOSIS — R0602 Shortness of breath: Secondary | ICD-10-CM

## 2020-04-04 DIAGNOSIS — K219 Gastro-esophageal reflux disease without esophagitis: Secondary | ICD-10-CM

## 2020-04-09 ENCOUNTER — Other Ambulatory Visit: Payer: Self-pay | Admitting: Endocrinology

## 2020-04-09 DIAGNOSIS — E119 Type 2 diabetes mellitus without complications: Secondary | ICD-10-CM

## 2020-04-18 ENCOUNTER — Ambulatory Visit: Payer: 59 | Admitting: Endocrinology

## 2020-04-20 ENCOUNTER — Other Ambulatory Visit: Payer: Self-pay | Admitting: Family Medicine

## 2020-04-20 DIAGNOSIS — R600 Localized edema: Secondary | ICD-10-CM

## 2020-04-20 DIAGNOSIS — R0602 Shortness of breath: Secondary | ICD-10-CM

## 2020-05-01 ENCOUNTER — Ambulatory Visit (INDEPENDENT_AMBULATORY_CARE_PROVIDER_SITE_OTHER): Payer: 59 | Admitting: Family Medicine

## 2020-05-01 ENCOUNTER — Encounter: Payer: Self-pay | Admitting: Family Medicine

## 2020-05-01 ENCOUNTER — Other Ambulatory Visit: Payer: Self-pay

## 2020-05-01 ENCOUNTER — Ambulatory Visit
Admission: RE | Admit: 2020-05-01 | Discharge: 2020-05-01 | Disposition: A | Discharge status: Home / Self Care | Payer: 59 | Source: Ambulatory Visit | Attending: Family Medicine | Admitting: Family Medicine

## 2020-05-01 ENCOUNTER — Ambulatory Visit
Admission: RE | Admit: 2020-05-01 | Discharge: 2020-05-01 | Disposition: A | Discharge status: Home / Self Care | Payer: 59 | Attending: Family Medicine | Admitting: Family Medicine

## 2020-05-01 VITALS — BP 128/84 | HR 70 | Temp 97.7°F | Resp 18 | Ht 62.0 in | Wt 237.3 lb

## 2020-05-01 DIAGNOSIS — Z79899 Other long term (current) drug therapy: Secondary | ICD-10-CM

## 2020-05-01 DIAGNOSIS — E782 Mixed hyperlipidemia: Secondary | ICD-10-CM | POA: Diagnosis not present

## 2020-05-01 DIAGNOSIS — K219 Gastro-esophageal reflux disease without esophagitis: Secondary | ICD-10-CM | POA: Diagnosis not present

## 2020-05-01 DIAGNOSIS — R0600 Dyspnea, unspecified: Secondary | ICD-10-CM | POA: Diagnosis present

## 2020-05-01 DIAGNOSIS — Z8701 Personal history of pneumonia (recurrent): Secondary | ICD-10-CM

## 2020-05-01 DIAGNOSIS — Z77090 Contact with and (suspected) exposure to asbestos: Secondary | ICD-10-CM

## 2020-05-01 DIAGNOSIS — Z5181 Encounter for therapeutic drug level monitoring: Secondary | ICD-10-CM

## 2020-05-01 DIAGNOSIS — F419 Anxiety disorder, unspecified: Secondary | ICD-10-CM

## 2020-05-01 DIAGNOSIS — F32A Depression, unspecified: Secondary | ICD-10-CM

## 2020-05-01 DIAGNOSIS — Z23 Encounter for immunization: Secondary | ICD-10-CM

## 2020-05-01 DIAGNOSIS — E119 Type 2 diabetes mellitus without complications: Secondary | ICD-10-CM

## 2020-05-01 DIAGNOSIS — F411 Generalized anxiety disorder: Secondary | ICD-10-CM

## 2020-05-01 DIAGNOSIS — I10 Essential (primary) hypertension: Secondary | ICD-10-CM | POA: Diagnosis not present

## 2020-05-01 DIAGNOSIS — F410 Panic disorder [episodic paroxysmal anxiety] without agoraphobia: Secondary | ICD-10-CM

## 2020-05-01 DIAGNOSIS — R0609 Other forms of dyspnea: Secondary | ICD-10-CM

## 2020-05-01 DIAGNOSIS — I358 Other nonrheumatic aortic valve disorders: Secondary | ICD-10-CM

## 2020-05-01 DIAGNOSIS — R600 Localized edema: Secondary | ICD-10-CM

## 2020-05-01 DIAGNOSIS — I491 Atrial premature depolarization: Secondary | ICD-10-CM

## 2020-05-01 DIAGNOSIS — Z6841 Body Mass Index (BMI) 40.0 and over, adult: Secondary | ICD-10-CM

## 2020-05-01 DIAGNOSIS — Z8709 Personal history of other diseases of the respiratory system: Secondary | ICD-10-CM

## 2020-05-01 DIAGNOSIS — Z794 Long term (current) use of insulin: Secondary | ICD-10-CM

## 2020-05-01 NOTE — Progress Notes (Signed)
Name: Alicia Pope   MRN: [id number]    DOB: 1960/04/14   Date:05/01/2020       Progress Note  Chief Complaint  Patient presents with  . Diabetes  . Hypertension  . Hyperlipidemia    Follow up     Subjective:   Alicia Pope is a 60 y.o. female, presents to clinic for routine f/up  Severe anxiety sx and disorder - she refuses to see specialists, is on prozac and ativan - have tried to wean off benzos due to high risk med - have explained this to the pt - she is using for both anxiety and insomnia.  I have suspected that she is afraid of her spouse and that he is a significant source of anxiety for her - she has never reported any specific abuse  Hypertension:  Currently managed on metoprolol and lisinopril HCTZ Pt reports good med compliance and denies any SE.   Blood pressure today is well controlled. BP Readings from Last 3 Encounters:  05/01/20 128/84  02/14/20 114/68  11/27/19 140/86   Pt denies CP, SOB, exertional sx, LE edema, palpitation, Ha's, visual disturbances, lightheadedness, hypotension, syncope. Dietary efforts for BP?  Low-salt  DM - IDDM - managed by specialists - on insulin, trulicity and he restarted her on XR metformin  Vit D deficiency on OTC supplement  Hx of recent LE edema, she has requested several refills of lasix, but has not come for f/up labs, it does seem to help, she is seeing cardiology and had fairly normal ECHO in 03/2019, no HF, LVEF was 55-60%, there was no explanation for DOE Cardiology has been managing frequent PAC's/palpitation sx, on BB, but it did cause some bradycardia She had nuc stress test last year after positive treadmill stress test Notes from last year 05/05/2019: Treadmill stress test showed pretty significant exercise intolerance.  It also showed significant increase in PVCs with exercise. As a result of this finding, I think we do need to do additional testing.  Probably the most accurate ischemic evaluation  would be a coronary CT angiogram. Should get this done prior to me seeing her for follow-up.  Bryan Lemma, MD  Eugenie Birks done on 05/29/2019: Study Highlights   The left ventricular ejection fraction is normal (55-65%).  Nuclear stress EF: 60%.  Poorly controlled hypertension.  There was no ST segment deviation noted during stress.  Defect 1: There is a small defect of moderate severity present in the mid anterior and apical anterior location that is present at both rest and stress. This likely represents breast attenuaion artifact. There is no ischemia.  This is a low risk study.   Last cardiology A&P April 2021: ASSESSMENT/PLAN        Problem List Items Addressed This Visit        Essential hypertension (Chronic)     Blood pressure looks good today.  She is taking her metoprolol as well as lisinopril and HCTZ.  HCTZ is helping swelling       Diabetes mellitus, type II, insulin dependent (HCC) (Chronic)     Managed by endocrinology.  Improving, but still not at goal.       Hyperlipidemia (Chronic)     On 40 mg Crestor.  LDL is 92. As a diabetic with hypertension, target LDL should be less than 100 with a goal of less than 70.  Continue for now.  Hopefully with weight loss and glycemic control her lipids will improve.  Combination of elevated triglycerides,  obesity and diabetes plus hypertension equilibrates to metabolic syndrome. 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  No evidence of stenosis.       PAC (premature atrial contraction) - Primary (Chronic)     Significant PACs noted on monitor however GXT suggested ventricular ectopy.  I wonder if this was a variant conduction.  Overall, symptoms are notably improved with  current dose of Toprol.  Not requiring additional doses.  Provided symptoms remain pain stability, would hold off on antiarrhythmic agent besides beta-blocker.         Alicia Pope is still having frequent episodes episodes of palpitations which by the monitor appeared to be just frequent PACs and notably in bigeminy.  Very difficult to determine the best way to treat this.  With her having exertional dyspnea and exercise intolerance we need to evaluate for any potential chronotropic incompetence while on beta-blocker.  I also do we will do a baseline ischemic evaluation as we may want to consider antiarrhythmic agents.  For now we will continue current dose of beta-blocker and check GXT to evaluate for chronotropic competence and ischemia.  Pending ischemic evaluation results and evaluation chronotropic incompetence, will discuss treatment options with electrophysiology to determine if simply titrating up beta-blocker versus considering type I antiarrhythmic would be reasonable.  With 19% PACs, what I even potentially consider the possibility of referral for ablation.  Anxiety/depression: GAD 7 : Generalized Anxiety Score 05/01/2020 11/09/2019 06/28/2019 01/10/2019  Nervous, Anxious, on Edge 2 0 1 3  Control/stop worrying 2 0 1 3  Worry too much - different things 2 0 1 3  Trouble relaxing 2 0 1 3  Restless 2 0 0 3  Easily annoyed or irritable 2 0 2 3  Afraid - awful might happen 2 0 0 1  Total GAD 7 Score 14 0 6 19  Anxiety Difficulty Not difficult at all Not difficult at all - -    [facility] 05/01/2020 11/09/2019 10/29/2019  Decreased Interest 0 0 0  Down, Depressed, Hopeless 2 0 0  PHQ - 2 Score 2 0 0  Altered sleeping 3 0 0  Tired, decreased energy 1 0 0  Change in appetite 1 0 0  Feeling bad or failure about yourself  1 0 0  Trouble concentrating 1 0 0  Moving slowly or fidgety/restless 0 0 0  Suicidal thoughts 0 0 0  PHQ-9 Score 9 0 0  Difficult doing  work/chores Somewhat difficult Not difficult at all Not difficult at all  Some recent data might be hidden   Reviewed GAD7 and PHQ9 today - controlled substance database reviewed today Pt needs controlled substance contract   Current Outpatient Medications:  .  aspirin 81 MG tablet, Take 1 tablet (81 mg total) by mouth daily., Disp: 30 tablet, Rfl: 11 .  CVS D3 2000 units CAPS, Take 2 capsules (4,000 Units total) by mouth daily., Disp: 30 each, Rfl: 3 .  Dulaglutide (TRULICITY) 1.5 MG/0.5ML SOPN, Inject 1.5 mg into the skin once a week., Disp: 6 mL, Rfl: 3 .  FLUoxetine (PROZAC) 20 MG tablet, TAKE 1 TABLET BY MOUTH EVERY DAY, Disp: 90 tablet, Rfl: 2 .  furosemide (LASIX) 20 MG tablet, Take tablet 20 mg PO daily in am PRN with potassium for  lower extremity edema, Disp: 20 tablet, Rfl: 0 .  LANTUS SOLOSTAR 100 UNIT/ML Solostar Pen, INJECT 90 UNITS INTO THE SKIN DAILY., Disp: 15 mL, Rfl: 3 .  lisinopril-hydrochlorothiazide (ZESTORETIC) 20-12.5 MG tablet, Take 1 tablet by mouth daily., Disp: 90 tablet, Rfl: 1 .  LORazepam (ATIVAN) 1 MG tablet, TAKE 1/2 OR 1 TABLET BY MOUTH 2 TIMES DAILY AS NEEDED FOR ANXIETY OR SLEEP (USE SPARINGLY)., Disp: 60 tablet, Rfl: 0 .  metoprolol succinate (TOPROL-XL) 25 MG 24 hr tablet, TAKE 1 TABLET BY MOUTH EVERY DAY, Disp: 90 tablet, Rfl: 2 .  pantoprazole (PROTONIX) 40 MG tablet, TAKE 1 TABLET BY MOUTH EVERY DAY, Disp: 90 tablet, Rfl: 3 .  rosuvastatin (CRESTOR) 40 MG tablet, TAKE 1 TABLET BY MOUTH EVERY DAY, Disp: 90 tablet, Rfl: 3 .  tiZANidine (ZANAFLEX) 4 MG tablet, SMARTSIG:3 Tablet(s) By Mouth Every Other Day PRN, Disp: , Rfl:  .  traZODone (DESYREL) 50 MG tablet, TAKE 1/2 UP TO 2 TABLETS BY MOUTH AT BEDTIME AS NEEDED FOR SLEEP, Disp: 180 tablet, Rfl: 2 .  B-D ULTRAFINE III SHORT PEN 31G X 8 MM MISC, USE WITH INSULIN 4 TIMES A DAY (Patient not taking: Reported on 05/01/2020), Disp: 200 each, Rfl: 1 .  glucose blood (CONTOUR NEXT TEST) test strip, 1 each by Other  route daily. E11.9 (Patient not taking: Reported on 05/01/2020), Disp: 100 each, Rfl: 0 .  Microlet Lancets MISC, 1 each by Does not apply route daily. E11.9 (Patient not taking: Reported on 05/01/2020), Disp: , Rfl:  .  potassium chloride SA (KLOR-CON M20) 20 MEQ tablet, TAKE 1 TABLET BY MOUTH EVERY DAY WHEN TAKING LASIX (FUROSEMIDE) (Patient not taking: Reported on 05/01/2020), Disp: 20 tablet, Rfl: 0  Patient Active Problem List   Diagnosis Date Noted  . High risk medication use 05/01/2020  . Aortic valve sclerosis 05/01/2020  . PAC (premature atrial contraction) 04/24/2019  . Sinus bradycardia, persistent 04/24/2019  . Exertional dyspnea 04/24/2019  . Systolic ejection murmur 02/20/2019  . Diabetes mellitus, type II, insulin dependent (HCC) 04/29/2016  . Insomnia 04/02/2015  . Obesity 09/23/2014  . Vitamin D deficiency 09/23/2014  . Screening for colorectal cancer 09/23/2014  . Anxiety   . Depression   . GERD (gastroesophageal reflux disease)   . Essential hypertension   . Hyperlipidemia     Past Surgical History:  Procedure Laterality Date  . ANTERIOR CRUCIATE LIGAMENT REPAIR  1997  . APPENDECTOMY  1987  . CARDIAC EVENT MONITOR  03/2019   No average rate 60 bpm.  Max HR 122, minimum HR 36 bpm-sinus bradycardia with PACs and bigeminy.  Frequent PACs seen as isolated, couplets, triplets as well as frequent bigeminy.Clovis Cao SECTION     78,29,56,21  . CHOLECYSTECTOMY  1987  . NM MYOVIEW LTD  05/2019   Normal EF 60%.  Poor control hypertension.  (194/101 mmHg).  Small size moderate severity fixed defect in the mid anterior and apical anterior wall likely representing breast attenuation artifact.  No evidence of ischemia (reversibility).  LOW RISK  . TRANSTHORACIC ECHOCARDIOGRAM  03/2019   EF 55-60 % with normal wall motion.  Slight LA dilation with grade 1 diastolic function.  Moderate aortic valve calcification-sclerosis without stenosis.  Otherwise normal.  . uterine  ablation      Family History  Problem Relation Age of Onset  . Cancer Father        Lymphoma, Leukemia  . Depression Father   . Cancer Maternal Grandfather   .  Cancer Paternal Grandmother   . Diabetes Paternal Grandfather   . Kidney disease Paternal Grandfather     Social History   Tobacco Use  . Smoking status: Never Smoker  . Smokeless tobacco: Never Used  Substance Use Topics  . Alcohol use: No  . Drug use: No     Allergies  Allergen Reactions  . Contrast Media [Iodinated Diagnostic Agents] Anaphylaxis  . Ambien [Zolpidem Tartrate] Other (See Comments)    Family has told her that after taking Ambien, she does "crazy things" that she does not remember doing.   . Codeine Nausea And Vomiting    Health Maintenance  Topic Date Due  . COLONOSCOPY (Pts 45-26yrs Insurance coverage will need to be confirmed)  Never done  . PAP SMEAR-Modifier  11/21/2016  . OPHTHALMOLOGY EXAM  10/04/2019  . COVID-19 Vaccine (1) 05/17/2020 (Originally 06/07/1965)  . MAMMOGRAM  07/25/2020  . HEMOGLOBIN A1C  08/13/2020  . FOOT EXAM  02/13/2021  . TETANUS/TDAP  11/06/2023  . INFLUENZA VACCINE  Completed  . PNEUMOCOCCAL POLYSACCHARIDE VACCINE AGE 27-64 HIGH RISK  Completed  . Hepatitis C Screening  Completed  . HIV Screening  Completed    Chart Review Today: I personally reviewed active problem list, medication list, allergies, family history, social history, health maintenance, notes from last encounter, lab results, imaging with the patient/caregiver today. More than 10 min spent on review personally today  Review of Systems  Constitutional: Negative.   HENT: Negative.   Eyes: Negative.   Respiratory: Negative.   Cardiovascular: Negative.   Gastrointestinal: Negative.   Endocrine: Negative.   Genitourinary: Negative.   Musculoskeletal: Negative.   Skin: Negative.   Allergic/Immunologic: Negative.   Neurological: Negative.   Hematological: Negative.   Psychiatric/Behavioral:  Negative.   All other systems reviewed and are negative.    Objective:   Vitals:   05/01/20 1318  BP: 128/84  Pulse: 70  Resp: 18  Temp: 97.7 F (36.5 C)  TempSrc: Oral  SpO2: 99%  Weight: 237 lb 4.8 oz (107.6 kg)  Height: 5\' 2"  (1.575 m)    Body mass index is 43.4 kg/m.  Physical Exam Vitals and nursing note reviewed.  Constitutional:      General: She is not in acute distress.    Appearance: Normal appearance. She is well-developed. She is obese. She is not ill-appearing, toxic-appearing or diaphoretic.     Interventions: Face mask in place.  HENT:     Head: Normocephalic and atraumatic.     Right Ear: External ear normal.     Left Ear: External ear normal.  Eyes:     General: Lids are normal. No scleral icterus.       Right eye: No discharge.        Left eye: No discharge.     Conjunctiva/sclera: Conjunctivae normal.  Neck:     Trachea: Phonation normal. No tracheal deviation.  Cardiovascular:     Rate and Rhythm: Normal rate and regular rhythm.     Pulses: Normal pulses.          Radial pulses are 2+ on the right side and 2+ on the left side.       Posterior tibial pulses are 2+ on the right side and 2+ on the left side.     Heart sounds: Normal heart sounds. No murmur heard. No friction rub. No gallop.   Pulmonary:     Effort: Pulmonary effort is normal. No respiratory distress.     Breath sounds: Normal  breath sounds. No stridor. No wheezing, rhonchi or rales.  Chest:     Chest wall: No tenderness.  Abdominal:     General: Bowel sounds are normal. There is no distension.     Palpations: Abdomen is soft.  Musculoskeletal:     Right lower leg: No edema.     Left lower leg: No edema.  Skin:    General: Skin is warm and dry.     Coloration: Skin is not jaundiced or pale.     Findings: No rash.  Neurological:     Mental Status: She is alert.     Motor: No abnormal muscle tone.     Gait: Gait normal.  Psychiatric:        Attention and Perception:  Attention normal.        Mood and Affect: Affect normal. Mood is anxious.        Speech: Speech normal.        Behavior: Behavior normal. Behavior is cooperative.         Assessment & Plan:     ICD-10-CM   1. Essential hypertension  I10 COMPLETE METABOLIC PANEL WITH GFR   Stable, well-controlled, blood pressure at goal today  2. Gastroesophageal reflux disease, unspecified whether esophagitis present  K21.9    Symptoms stable  3. Mixed hyperlipidemia  E78.2 COMPLETE METABOLIC PANEL WITH GFR    Lipid panel   Good statin compliance, due for labs, no myalgias  4. Anxiety  F41.9    Still not well controlled have referred to psych and recommended therapy several times in the past  5. Depression, unspecified depression type  F32.A    PHQ-9 reviewed today patient would like to continue same meds  6. Class 3 severe obesity with serious comorbidity and body mass index (BMI) of 45.0 to 49.9 in adult, unspecified obesity type (HCC)  E66.01 COMPLETE METABOLIC PANEL WITH GFR   Z68.42 Lipid panel    TSH    Hemoglobin A1c  7. Diabetes mellitus, type II, insulin dependent (HCC)  E11.9 COMPLETE METABOLIC PANEL WITH GFR   Z79.4 Lipid panel    Hemoglobin A1c   Per endocrinology  8. High risk medication use  Z79.899    Reviewed again benzo use and gradual weaning off of med, again recommended that she consult with psychiatry  9. Lower extremity edema  R60.0 COMPLETE METABOLIC PANEL WITH GFR   No current edema she states she is using Lasix only as needed  10. Generalized anxiety disorder with panic attacks  F41.1    F41.0    Not well controlled, see above  11. Encounter for medication monitoring  Z51.81 COMPLETE METABOLIC PANEL WITH GFR    CBC with Differential/Platelet    Lipid panel    TSH    Hemoglobin A1c  12. PAC (premature atrial contraction)  I49.1 COMPLETE METABOLIC PANEL WITH GFR    TSH   Per cardiology  13. Exertional dyspnea  R06.00    Per cardiology, also some pulmonary history  which I was not aware of, see below  14. Aortic valve sclerosis  I35.8    Per cardiology - Dr. Herbie Baltimore, no stenosis  15. Need for influenza vaccination  Z23 Flu Vaccine QUAD 6+ mos PF IM (Fluarix Quad PF)  16. Dyspnea, unspecified type  R06.00 DG Chest 2 View   May need PFTs or pulmonary evaluation  17. History of pneumonia  Z87.01 DG Chest 2 View  18. Asbestos exposure  Z77.090 DG Chest 2 View  19. History of asthma  Z87.09      Routine follow-up in 4 to 6 months depending on lab results  Alicia Berry, PA-C 05/01/20 1:56 PM

## 2020-05-01 NOTE — Patient Instructions (Signed)
Health Maintenance  Topic Date Due  . COVID-19 Vaccine (1) Never done  . Colon Cancer Screening  Never done  . Pap Smear  11/21/2016  . Eye exam for diabetics  10/04/2019  . Flu Shot  11/04/2019  . Hemoglobin A1C  08/13/2020  . Complete foot exam   02/13/2021  . Mammogram  07/25/2021  . Tetanus Vaccine  11/06/2023  . Pneumococcal vaccine  Completed  .  Hepatitis C: One time screening is recommended by Center for Disease Control  (CDC) for  adults born from [birth year] through 1965.   Completed  . HIV Screening  Completed

## 2020-05-02 ENCOUNTER — Other Ambulatory Visit: Payer: Self-pay | Admitting: Family Medicine

## 2020-05-02 LAB — COMPLETE METABOLIC PANEL WITH GFR
AG Ratio: 1.6 (calc) (ref 1.0–2.5)
ALT: 21 U/L (ref 6–29)
AST: 20 U/L (ref 10–35)
Albumin: 4.2 g/dL (ref 3.6–5.1)
Alkaline phosphatase (APISO): 58 U/L (ref 37–153)
BUN: 12 mg/dL (ref 7–25)
CO2: 32 mmol/L (ref 20–32)
Calcium: 9.6 mg/dL (ref 8.6–10.4)
Chloride: 102 mmol/L (ref 98–110)
Creat: 0.75 mg/dL (ref 0.50–1.05)
GFR, Est African American: 101 mL/min/{1.73_m2} (ref >=60)
GFR, Est Non African American: 87 mL/min/{1.73_m2} (ref >=60)
Globulin: 2.6 g/dL (calc) (ref 1.9–3.7)
Glucose, Bld: 162 mg/dL — ABNORMAL HIGH (ref 65–99)
Potassium: 4.3 mmol/L (ref 3.5–5.3)
Sodium: 140 mmol/L (ref 135–146)
Total Bilirubin: 0.4 mg/dL (ref 0.2–1.2)
Total Protein: 6.8 g/dL (ref 6.1–8.1)

## 2020-05-02 LAB — CBC WITH DIFFERENTIAL/PLATELET
Absolute Monocytes: 378 cells/uL (ref 200–950)
Basophils Absolute: 41 cells/uL (ref 0–200)
Basophils Relative: 0.7 %
Eosinophils Absolute: 159 cells/uL (ref 15–500)
Eosinophils Relative: 2.7 %
HCT: 45.2 % — ABNORMAL HIGH (ref 35.0–45.0)
Hemoglobin: 15.4 g/dL (ref 11.7–15.5)
Lymphs Abs: 1947 cells/uL (ref 850–3900)
MCH: 29.9 pg (ref 27.0–33.0)
MCHC: 34.1 g/dL (ref 32.0–36.0)
MCV: 87.8 fL (ref 80.0–100.0)
MPV: 10.5 fL (ref 7.5–12.5)
Monocytes Relative: 6.4 %
Neutro Abs: 3375 cells/uL (ref 1500–7800)
Neutrophils Relative %: 57.2 %
Platelets: 218 10*3/uL (ref 140–400)
RBC: 5.15 10*6/uL — ABNORMAL HIGH (ref 3.80–5.10)
RDW: 13.1 % (ref 11.0–15.0)
Total Lymphocyte: 33 %
WBC: 5.9 10*3/uL (ref 3.8–10.8)

## 2020-05-02 LAB — LIPID PANEL
Cholesterol: 181 mg/dL (ref <200)
HDL: 60 mg/dL (ref >=50)
LDL Cholesterol (Calc): 89 mg/dL (calc)
Non-HDL Cholesterol (Calc): 121 mg/dL (calc) (ref <130)
Total CHOL/HDL Ratio: 3 (calc) (ref <5.0)
Triglycerides: 229 mg/dL — ABNORMAL HIGH (ref <150)

## 2020-05-02 LAB — HEMOGLOBIN A1C
Hgb A1c MFr Bld: 7 % of total Hgb — ABNORMAL HIGH (ref <5.7)
Mean Plasma Glucose: 154 mg/dL
eAG (mmol/L): 8.5 mmol/L

## 2020-05-02 LAB — TSH: TSH: 1.48 mIU/L (ref 0.40–4.50)

## 2020-05-06 ENCOUNTER — Other Ambulatory Visit: Payer: Self-pay | Admitting: Family Medicine

## 2020-05-06 DIAGNOSIS — R0602 Shortness of breath: Secondary | ICD-10-CM

## 2020-05-06 DIAGNOSIS — R600 Localized edema: Secondary | ICD-10-CM

## 2020-05-07 ENCOUNTER — Other Ambulatory Visit: Payer: Self-pay | Admitting: Family Medicine

## 2020-05-07 DIAGNOSIS — F410 Panic disorder [episodic paroxysmal anxiety] without agoraphobia: Secondary | ICD-10-CM

## 2020-05-11 ENCOUNTER — Other Ambulatory Visit: Payer: Self-pay | Admitting: Endocrinology

## 2020-05-11 NOTE — Telephone Encounter (Signed)
1.  Please schedule f/u appt 2.  Then please refill x 2 mos, pending that appt.  

## 2020-05-21 ENCOUNTER — Other Ambulatory Visit: Payer: Self-pay | Admitting: Family Medicine

## 2020-05-21 ENCOUNTER — Other Ambulatory Visit: Payer: Self-pay

## 2020-05-21 DIAGNOSIS — I10 Essential (primary) hypertension: Secondary | ICD-10-CM

## 2020-05-21 DIAGNOSIS — R0602 Shortness of breath: Secondary | ICD-10-CM

## 2020-05-21 DIAGNOSIS — R600 Localized edema: Secondary | ICD-10-CM

## 2020-05-21 MED ORDER — LISINOPRIL-HYDROCHLOROTHIAZIDE 20-12.5 MG PO TABS: 1.0000 | ORAL_TABLET | Freq: Every day | ORAL | 1 refills | Status: DC

## 2020-06-04 ENCOUNTER — Other Ambulatory Visit: Payer: Self-pay

## 2020-06-06 ENCOUNTER — Other Ambulatory Visit: Payer: Self-pay

## 2020-06-06 ENCOUNTER — Ambulatory Visit: Payer: 59 | Admitting: Endocrinology

## 2020-06-06 VITALS — BP 126/70 | Ht 62.0 in | Wt 235.4 lb

## 2020-06-06 DIAGNOSIS — E119 Type 2 diabetes mellitus without complications: Secondary | ICD-10-CM

## 2020-06-06 DIAGNOSIS — Z794 Long term (current) use of insulin: Secondary | ICD-10-CM

## 2020-06-06 DIAGNOSIS — E1165 Type 2 diabetes mellitus with hyperglycemia: Secondary | ICD-10-CM

## 2020-06-06 LAB — POCT GLYCOSYLATED HEMOGLOBIN (HGB A1C): Hemoglobin A1C: 6.9 % — AB (ref 4.0–5.6)

## 2020-06-06 MED ORDER — TRULICITY 3 MG/0.5ML ~~LOC~~ SOAJ: 3.0000 mg | SUBCUTANEOUS | 3 refills | Status: DC

## 2020-06-06 MED ORDER — LANTUS SOLOSTAR 100 UNIT/ML ~~LOC~~ SOPN: 65.0000 [IU] | PEN_INJECTOR | SUBCUTANEOUS | 3 refills | Status: DC

## 2020-06-06 NOTE — Patient Instructions (Addendum)
check your blood sugar twice a day.  vary the time of day when you check, between before the 3 meals, and at bedtime.  also check if you have symptoms of your blood sugar being too high or too low.  please keep a record of the readings and bring it to your next appointment here (or you can bring the meter itself).  You can write it on any piece of paper.  please call us sooner if your blood sugar goes below 70, or if you have a lot of readings over 200. On this type of insulin schedule, you should eat meals on a regular schedule.  If a meal is missed or significantly delayed, your blood sugar could go low.  I have sent a prescription to your pharmacy, to increase Trulicity again, and:  Please reduce the Lantus to 65 units each morning.   Please come back for a follow-up appointment in 2 months.

## 2020-06-06 NOTE — Progress Notes (Signed)
Subjective:    [name], female    DOB: [birth year], 60 y.o.   MRN: [id number]  HPI Pt returns for f/u of diabetes mellitus: DM type: Insulin-requiring type 2.  Dx'ed: 2004 Complications: none Therapy: insulin since 2018 GDM: 1988 DKA: never Severe hypoglycemia: never Pancreatitis: never Pancreatic imaging: normal on 2013 CT Other: she did not tolerate Invokana (vaginitis); she stopped victoza, due to cost; she takes multiple daily injections.  Interval history: Pt says she takes insulin as rx'ed.  no cbg record, but states cbg's vary from 120-200.   Past Medical History:  Diagnosis Date  . Allergy   . Anxiety 2013  . Asthma 2015  . Asthma   . Depression 2013  . Diabetes mellitus 2004  . Finding of multiple premature atrial contractions by electrocardiography   . GERD (gastroesophageal reflux disease) 2013  . Hyperlipidemia 1992  . Hypertension 1992  . Memory impairment     Past Surgical History:  Procedure Laterality Date  . ANTERIOR CRUCIATE LIGAMENT REPAIR  1997  . APPENDECTOMY  1987  . CARDIAC EVENT MONITOR  03/2019   No average rate 60 bpm.  Max HR 122, minimum HR 36 bpm-sinus bradycardia with PACs and bigeminy.  Frequent PACs seen as isolated, couplets, triplets as well as frequent bigeminy.Clovis Cao SECTION     62,83,66,29  . CHOLECYSTECTOMY  1987  . NM MYOVIEW LTD  05/2019   Normal EF 60%.  Poor control hypertension.  (194/101 mmHg).  Small size moderate severity fixed defect in the mid anterior and apical anterior wall likely representing breast attenuation artifact.  No evidence of ischemia (reversibility).  LOW RISK  . TRANSTHORACIC ECHOCARDIOGRAM  03/2019   EF 55-60 % with normal wall motion.  Slight LA dilation with grade 1 diastolic function.  Moderate aortic valve calcification-sclerosis without stenosis.  Otherwise normal.  . uterine ablation      Social History   Socioeconomic History  . Marital status: Married    Spouse  name: Not on file  . Number of children: Not on file  . Years of education: Not on file  . Highest education level: Not on file  Occupational History  . Not on file  Tobacco Use  . Smoking status: Never Smoker  . Smokeless tobacco: Never Used  Substance and Sexual Activity  . Alcohol use: No  . Drug use: No  . Sexual activity: Yes  Other Topics Concern  . Not on file  Social History Narrative   Married.    Lives with husband and youngest son, who is 75 y/o.   Has 4 children.   Does not work outside of home.    No formal exercise. Only activity around the house.   Tries to be careful with Diabetic diet.   Social Determinants of Health   Financial Resource Strain: Not on file  Food Insecurity: Not on file  Transportation Needs: Not on file  Physical Activity: Not on file  Stress: Not on file  Social Connections: Not on file  Intimate Partner Violence: Not on file    Current Outpatient Medications on File Prior to Visit  Medication Sig Dispense Refill  . aspirin 81 MG tablet Take 1 tablet (81 mg total) by mouth daily. 30 tablet 11  . B-D ULTRAFINE III SHORT PEN 31G X 8 MM MISC USE WITH INSULIN 4 TIMES A DAY 200 each 1  . CVS D3 2000 units CAPS Take 2 capsules (4,000 Units total) by mouth  daily. 30 each 3  . FLUoxetine (PROZAC) 20 MG tablet TAKE 1 TABLET BY MOUTH EVERY DAY 90 tablet 2  . furosemide (LASIX) 20 MG tablet TAKE 1 TABLET BY MOUTH DAILY IN MORNING AS NEEDED WITH POTASSIUM FOR LOWER EXTREMITY EDEMA 20 tablet 0  . glucose blood (CONTOUR NEXT TEST) test strip 1 each by Other route daily. E11.9 100 each 0  . lisinopril-hydrochlorothiazide (ZESTORETIC) 20-12.5 MG tablet Take 1 tablet by mouth daily. 90 tablet 1  . LORazepam (ATIVAN) 1 MG tablet TAKE 1/2 OR 1 TABLET BY MOUTH TWICE A DAY AS NEEDED FOR ANXIETY/SLEEP USE SPARINGLY 50 tablet 0  . metoprolol succinate (TOPROL-XL) 25 MG 24 hr tablet TAKE 1 TABLET BY MOUTH EVERY DAY 90 tablet 2  . Microlet Lancets MISC 1 each  by Does not apply route daily. E11.9    . pantoprazole (PROTONIX) 40 MG tablet TAKE 1 TABLET BY MOUTH EVERY DAY 90 tablet 3  . rosuvastatin (CRESTOR) 40 MG tablet TAKE 1 TABLET BY MOUTH EVERY DAY 90 tablet 3  . tiZANidine (ZANAFLEX) 4 MG tablet SMARTSIG:3 Tablet(s) By Mouth Every Other Day PRN    . traZODone (DESYREL) 50 MG tablet TAKE 1/2 UP TO 2 TABLETS BY MOUTH AT BEDTIME AS NEEDED FOR SLEEP 180 tablet 2   No current facility-administered medications on file prior to visit.    Allergies  Allergen Reactions  . Contrast Media [Iodinated Diagnostic Agents] Anaphylaxis  . Ambien [Zolpidem Tartrate] Other (See Comments)    Family has told her that after taking Ambien, she does "crazy things" that she does not remember doing.   . Codeine Nausea And Vomiting    Family History  Problem Relation Age of Onset  . Cancer Father        Lymphoma, Leukemia  . Depression Father   . Cancer Maternal Grandfather   . Cancer Paternal Grandmother   . Diabetes Paternal Grandfather   . Kidney disease Paternal Grandfather     BP 126/70 (BP Location: Right Arm, Patient Position: Sitting, Cuff Size: Large)   Ht 5\' 2"  (1.575 m)   Wt 235 lb 6.4 oz (106.8 kg)   BMI 43.06 kg/m   Review of Systems She denies hypoglycemia/n/v    Objective:   Physical Exam VITAL SIGNS:  See vs page GENERAL: no distress Pulses: dorsalis pedis intact bilat.   MSK: no deformity of the feet CV: trace bilat leg edema Skin:  no ulcer on the feet.  normal color and temp on the feet. Neuro: sensation is intact to touch on the feet    Lab Results  Component Value Date   HGBA1C 6.9 (A) 06/06/2020       Assessment & Plan:  Insulin-requiring type 2 DM: overcontrolled, for qd insulin.  We'll favor GL rx  Patient Instructions  check your blood sugar twice a day.  vary the time of day when you check, between before the 3 meals, and at bedtime.  also check if you have symptoms of your blood sugar being too high or too  low.  please keep a record of the readings and bring it to your next appointment here (or you can bring the meter itself).  You can write it on any piece of paper.  please call 08/06/2020 sooner if your blood sugar goes below 70, or if you have a lot of readings over 200. On this type of insulin schedule, you should eat meals on a regular schedule.  If a meal is missed or significantly  delayed, your blood sugar could go low.  I have sent a prescription to your pharmacy, to increase Trulicity again, and:  Please reduce the Lantus to 65 units each morning.   Please come back for a follow-up appointment in 2 months.

## 2020-06-27 ENCOUNTER — Other Ambulatory Visit: Payer: Self-pay | Admitting: Family Medicine

## 2020-06-27 DIAGNOSIS — F430 Acute stress reaction: Secondary | ICD-10-CM

## 2020-06-27 DIAGNOSIS — F411 Generalized anxiety disorder: Secondary | ICD-10-CM

## 2020-06-27 DIAGNOSIS — R600 Localized edema: Secondary | ICD-10-CM

## 2020-06-27 DIAGNOSIS — F410 Panic disorder [episodic paroxysmal anxiety] without agoraphobia: Secondary | ICD-10-CM

## 2020-06-27 DIAGNOSIS — R0602 Shortness of breath: Secondary | ICD-10-CM

## 2020-07-16 ENCOUNTER — Other Ambulatory Visit: Payer: Self-pay | Admitting: Family Medicine

## 2020-07-16 DIAGNOSIS — R600 Localized edema: Secondary | ICD-10-CM

## 2020-07-16 DIAGNOSIS — R0602 Shortness of breath: Secondary | ICD-10-CM

## 2020-07-22 ENCOUNTER — Other Ambulatory Visit: Payer: Self-pay | Admitting: Endocrinology

## 2020-07-22 DIAGNOSIS — Z794 Long term (current) use of insulin: Secondary | ICD-10-CM

## 2020-07-22 DIAGNOSIS — E119 Type 2 diabetes mellitus without complications: Secondary | ICD-10-CM

## 2020-07-24 ENCOUNTER — Other Ambulatory Visit: Payer: Self-pay | Admitting: Family Medicine

## 2020-07-24 DIAGNOSIS — R600 Localized edema: Secondary | ICD-10-CM

## 2020-07-24 DIAGNOSIS — R0602 Shortness of breath: Secondary | ICD-10-CM

## 2020-07-31 ENCOUNTER — Ambulatory Visit: Payer: 59 | Admitting: Family Medicine

## 2020-08-04 ENCOUNTER — Ambulatory Visit: Payer: 59 | Admitting: Family Medicine

## 2020-08-04 ENCOUNTER — Other Ambulatory Visit: Payer: Self-pay

## 2020-08-04 ENCOUNTER — Encounter: Payer: Self-pay | Admitting: Family Medicine

## 2020-08-04 VITALS — BP 130/84 | HR 64 | Temp 97.7°F | Resp 16 | Ht 63.0 in | Wt 226.3 lb

## 2020-08-04 DIAGNOSIS — E1165 Type 2 diabetes mellitus with hyperglycemia: Secondary | ICD-10-CM | POA: Diagnosis not present

## 2020-08-04 DIAGNOSIS — Z6841 Body Mass Index (BMI) 40.0 and over, adult: Secondary | ICD-10-CM

## 2020-08-04 DIAGNOSIS — G4700 Insomnia, unspecified: Secondary | ICD-10-CM

## 2020-08-04 DIAGNOSIS — Z1231 Encounter for screening mammogram for malignant neoplasm of breast: Secondary | ICD-10-CM

## 2020-08-04 DIAGNOSIS — K219 Gastro-esophageal reflux disease without esophagitis: Secondary | ICD-10-CM | POA: Diagnosis not present

## 2020-08-04 DIAGNOSIS — I10 Essential (primary) hypertension: Secondary | ICD-10-CM | POA: Diagnosis not present

## 2020-08-04 DIAGNOSIS — F419 Anxiety disorder, unspecified: Secondary | ICD-10-CM

## 2020-08-04 DIAGNOSIS — Z79899 Other long term (current) drug therapy: Secondary | ICD-10-CM

## 2020-08-04 DIAGNOSIS — Z794 Long term (current) use of insulin: Secondary | ICD-10-CM

## 2020-08-04 DIAGNOSIS — R600 Localized edema: Secondary | ICD-10-CM

## 2020-08-04 DIAGNOSIS — M545 Low back pain, unspecified: Secondary | ICD-10-CM

## 2020-08-04 DIAGNOSIS — E782 Mixed hyperlipidemia: Secondary | ICD-10-CM

## 2020-08-04 DIAGNOSIS — G8929 Other chronic pain: Secondary | ICD-10-CM | POA: Insufficient documentation

## 2020-08-04 DIAGNOSIS — F411 Generalized anxiety disorder: Secondary | ICD-10-CM

## 2020-08-04 DIAGNOSIS — F410 Panic disorder [episodic paroxysmal anxiety] without agoraphobia: Secondary | ICD-10-CM

## 2020-08-04 DIAGNOSIS — E119 Type 2 diabetes mellitus without complications: Secondary | ICD-10-CM

## 2020-08-04 DIAGNOSIS — F32A Depression, unspecified: Secondary | ICD-10-CM

## 2020-08-04 MED ORDER — LORAZEPAM 1 MG PO TABS: ORAL_TABLET | ORAL | 0 refills | Status: DC

## 2020-08-04 MED ORDER — LORAZEPAM 1 MG PO TABS: ORAL_TABLET | ORAL | 0 refills | Status: AC

## 2020-08-04 NOTE — Progress Notes (Signed)
Name: Alicia Pope   MRN: [id number]    DOB: 05/08/1960   Date:08/04/2020       Progress Note  Chief Complaint  Patient presents with  . Follow-up  . Hyperlipidemia  . Insomnia  . Depression     Subjective:   Alicia Pope is a 60 y.o. female, presents to clinic for routine f/up  DM - IDDM managed by endocrinology - on insulin, trulicity and tried metformin Lab Results  Component Value Date   HGBA1C 6.9 (A) 06/06/2020  well controlled with addition of trulicity - up to 3 mg dose -its making her nauseated constantly  Morbid obesity-  Weight going down 14 lbs in the past 9 months Wt Readings from Last 5 Encounters:  08/04/20 226 lb 4.8 oz (102.6 kg)  06/06/20 235 lb 6.4 oz (106.8 kg)  05/01/20 237 lb 4.8 oz (107.6 kg)  02/14/20 241 lb (109.3 kg)  11/27/19 240 lb 6.4 oz (109 kg)   BMI Readings from Last 5 Encounters:  08/04/20 40.09 kg/m  06/06/20 43.06 kg/m  05/01/20 43.40 kg/m  02/14/20 44.08 kg/m  11/27/19 43.97 kg/m   Anxiety, depression, multiple sources of stress- takes care of several family members, has been dealing with her own health, and seems to be married to a very controlling husband - though she has not specifically reported any abuse physical, sexual or otherwise  On fluoxitine 20 mg and still using ativan regularly for anxiety and insomnia and trazadone [facility] 08/04/2020 05/01/2020 11/09/2019  Decreased Interest 0 0 0  Down, Depressed, Hopeless 0 2 0  PHQ - 2 Score 0 2 0  Altered sleeping 0 3 0  Tired, decreased energy 0 1 0  Change in appetite 0 1 0  Feeling bad or failure about yourself  0 1 0  Trouble concentrating 0 1 0  Moving slowly or fidgety/restless 0 0 0  Suicidal thoughts 0 0 0  PHQ-9 Score 0 9 0  Difficult doing work/chores Not difficult at all Somewhat difficult Not difficult at all  Some recent data might be hidden   GAD 7 : Generalized Anxiety Score 08/04/2020 05/01/2020 11/09/2019 06/28/2019  Nervous, Anxious,  on Edge 1 2 0 1  Control/stop worrying 1 2 0 1  Worry too much - different things 1 2 0 1  Trouble relaxing 1 2 0 1  Restless 1 2 0 0  Easily annoyed or irritable 2 2 0 2  Afraid - awful might happen 1 2 0 0  Total GAD 7 Score 8 14 0 6  Anxiety Difficulty Not difficult at all Not difficult at all Not difficult at all -   Controlled substance database renewed - she has not gotten a refill on lorazepam x3 months, last disp was #50, not using daily  Chronic back and leg pain, knees a little better with weight loss, but still difficult walking long distances, she has to lean on a cart - requests handicap application renewal Using muscle relaxers rarely   Hyperlipidemia: On statin, lipids checked last appt Last Lipids: Lab Results  Component Value Date   CHOL 181 05/01/2020   HDL 60 05/01/2020   LDLCALC 89 05/01/2020   TRIG 229 (H) 05/01/2020   CHOLHDL 3.0 05/01/2020   - Denies: Chest pain, shortness of breath, myalgias, claudication  Hypertension:  Currently managed on lisinopril HCTZ, BB - dose per cardiology for PAC/sinus brady - still taking one metoprolol per day and additional if having palpitations, still  comes and goes, no worsening of sx- still following with cardiology Pt reports good med compliance and denies any SE.   Blood pressure today is well controlled. BP Readings from Last 3 Encounters:  08/04/20 130/84  06/06/20 126/70  05/01/20 128/84   Pt denies CP, SOB, exertional sx, LE edema, palpitation, Ha's, visual disturbances, lightheadedness, hypotension, syncope.   GERD - on protonix - nausea from trulicity, gerd sx well controlled  Due for mammogram and cervical ca screening Also due for shingrix and has not done covid vax     Current Outpatient Medications:  .  aspirin 81 MG tablet, Take 1 tablet (81 mg total) by mouth daily., Disp: 30 tablet, Rfl: 11 .  B-D ULTRAFINE III SHORT PEN 31G X 8 MM MISC, USE WITH INSULIN 4 TIMES A DAY, Disp: 200 each, Rfl: 1 .   [employer] D3 2000 units CAPS, Take 2 capsules (4,000 Units total) by mouth daily., Disp: 30 each, Rfl: 3 .  Dulaglutide (TRULICITY) 3 MG/0.5ML SOPN, Inject 3 mg as directed once a week., Disp: 6 mL, Rfl: 3 .  FLUoxetine (PROZAC) 20 MG tablet, TAKE 1 TABLET BY MOUTH EVERY DAY, Disp: 90 tablet, Rfl: 2 .  furosemide (LASIX) 20 MG tablet, TAKE 1 TABLET BY MOUTH DAILY IN MORNING AS NEEDED WITH POTASSIUM FOR LOWER EXTREMITY EDEMA, Disp: 20 tablet, Rfl: 0 .  glucose blood (CONTOUR NEXT TEST) test strip, 1 each by Other route daily. E11.9, Disp: 100 each, Rfl: 0 .  insulin glargine (LANTUS SOLOSTAR) 100 UNIT/ML Solostar Pen, Inject 65 Units into the skin every morning., Disp: 75 mL, Rfl: 3 .  lisinopril-hydrochlorothiazide (ZESTORETIC) 20-12.5 MG tablet, Take 1 tablet by mouth daily., Disp: 90 tablet, Rfl: 1 .  LORazepam (ATIVAN) 1 MG tablet, TAKE 1/2 OR 1 TABLET BY MOUTH TWICE A DAY AS NEEDED FOR ANXIETY/SLEEP USE SPARINGLY, Disp: 50 tablet, Rfl: 0 .  metoprolol succinate (TOPROL-XL) 25 MG 24 hr tablet, TAKE 1 TABLET BY MOUTH EVERY DAY, Disp: 90 tablet, Rfl: 2 .  Microlet Lancets MISC, 1 each by Does not apply route daily. E11.9, Disp: , Rfl:  .  pantoprazole (PROTONIX) 40 MG tablet, TAKE 1 TABLET BY MOUTH EVERY DAY, Disp: 90 tablet, Rfl: 3 .  rosuvastatin (CRESTOR) 40 MG tablet, TAKE 1 TABLET BY MOUTH EVERY DAY, Disp: 90 tablet, Rfl: 3 .  tiZANidine (ZANAFLEX) 4 MG tablet, SMARTSIG:3 Tablet(s) By Mouth Every Other Day PRN, Disp: , Rfl:  .  traZODone (DESYREL) 50 MG tablet, TAKE 1/2 UP TO 2 TABLETS BY MOUTH AT BEDTIME AS NEEDED FOR SLEEP, Disp: 180 tablet, Rfl: 2  Patient Active Problem List   Diagnosis Date Noted  . High risk medication use 05/01/2020  . Aortic valve sclerosis 05/01/2020  . PAC (premature atrial contraction) 04/24/2019  . Sinus bradycardia, persistent 04/24/2019  . Exertional dyspnea 04/24/2019  . Systolic ejection murmur 02/20/2019  . Diabetes mellitus, type II, insulin dependent (HCC)  04/29/2016  . Insomnia 04/02/2015  . Obesity 09/23/2014  . Vitamin D deficiency 09/23/2014  . Screening for colorectal cancer 09/23/2014  . Anxiety   . Depression   . GERD (gastroesophageal reflux disease)   . Essential hypertension   . Hyperlipidemia     Past Surgical History:  Procedure Laterality Date  . ANTERIOR CRUCIATE LIGAMENT REPAIR  1997  . APPENDECTOMY  1987  . CARDIAC EVENT MONITOR  03/2019   No average rate 60 bpm.  Max HR 122, minimum HR 36 bpm-sinus bradycardia with PACs and bigeminy.  Frequent PACs seen as isolated, couplets, triplets as well as frequent bigeminy.Clovis Cao SECTION     69,48,54,62  . CHOLECYSTECTOMY  1987  . NM MYOVIEW LTD  05/2019   Normal EF 60%.  Poor control hypertension.  (194/101 mmHg).  Small size moderate severity fixed defect in the mid anterior and apical anterior wall likely representing breast attenuation artifact.  No evidence of ischemia (reversibility).  LOW RISK  . TRANSTHORACIC ECHOCARDIOGRAM  03/2019   EF 55-60 % with normal wall motion.  Slight LA dilation with grade 1 diastolic function.  Moderate aortic valve calcification-sclerosis without stenosis.  Otherwise normal.  . uterine ablation      Family History  Problem Relation Age of Onset  . Cancer Father        Lymphoma, Leukemia  . Depression Father   . Cancer Maternal Grandfather   . Cancer Paternal Grandmother   . Diabetes Paternal Grandfather   . Kidney disease Paternal Grandfather     Social History   Tobacco Use  . Smoking status: Never Smoker  . Smokeless tobacco: Never Used  Substance Use Topics  . Alcohol use: No  . Drug use: No     Allergies  Allergen Reactions  . Contrast Media [Iodinated Diagnostic Agents] Anaphylaxis  . Ambien [Zolpidem Tartrate] Other (See Comments)    Family has told her that after taking Ambien, she does "crazy things" that she does not remember doing.   . Codeine Nausea And Vomiting    Health Maintenance  Topic Date Due   . PAP SMEAR-Modifier  11/21/2016  . OPHTHALMOLOGY EXAM  10/04/2019  . MAMMOGRAM  07/25/2020  . COVID-19 Vaccine (1) 08/20/2020 (Originally 06/07/1965)  . COLONOSCOPY (Pts 45-57yrs Insurance coverage will need to be confirmed)  08/04/2021 (Originally 06/07/2005)  . INFLUENZA VACCINE  11/03/2020  . HEMOGLOBIN A1C  12/07/2020  . FOOT EXAM  06/06/2021  . TETANUS/TDAP  11/06/2023  . PNEUMOCOCCAL POLYSACCHARIDE VACCINE AGE 74-64 HIGH RISK  Completed  . Hepatitis C Screening  Completed  . HIV Screening  Completed  . HPV VACCINES  Aged Out    Chart Review Today: I personally reviewed active problem list, medication list, allergies, family history, social history, health maintenance, notes from last encounter, lab results, imaging with the patient/caregiver today.   Review of Systems  Constitutional: Negative.   HENT: Negative.   Eyes: Negative.   Respiratory: Negative.   Cardiovascular: Negative.   Gastrointestinal: Negative.   Endocrine: Negative.   Genitourinary: Negative.   Musculoskeletal: Negative.   Skin: Negative.   Allergic/Immunologic: Negative.   Neurological: Negative.   Hematological: Negative.   Psychiatric/Behavioral: Negative.   All other systems reviewed and are negative.    Objective:   Vitals:   08/04/20 0858  BP: 130/84  Pulse: 64  Resp: 16  Temp: 97.7 F (36.5 C)  SpO2: 96%  Weight: 226 lb 4.8 oz (102.6 kg)  Height: 5\' 3"  (1.6 m)    Body mass index is 40.09 kg/m.  Physical Exam Vitals and nursing note reviewed.  Constitutional:      General: She is not in acute distress.    Appearance: Normal appearance. She is obese. She is not ill-appearing, toxic-appearing or diaphoretic.  HENT:     Head: Normocephalic and atraumatic.     Right Ear: External ear normal.     Left Ear: External ear normal.  Eyes:     Conjunctiva/sclera: Conjunctivae normal.  Cardiovascular:     Rate and Rhythm: Regular rhythm. Bradycardia  present.     Pulses: Normal pulses.      Heart sounds: Normal heart sounds. No murmur heard. No friction rub. No gallop.   Pulmonary:     Effort: Pulmonary effort is normal. No respiratory distress.     Breath sounds: Normal breath sounds. No stridor. No wheezing or rales.  Abdominal:     General: Bowel sounds are normal. There is no distension.  Skin:    Coloration: Skin is not jaundiced or pale.  Neurological:     Mental Status: She is alert. Mental status is at baseline.  Psychiatric:        Mood and Affect: Mood normal.        Behavior: Behavior normal.         Assessment & Plan:     ICD-10-CM   1. Type 2 diabetes mellitus with hyperglycemia, without long-term current use of insulin (HCC)  E11.65    improved glycemic control, A1C at goal - per endocrinology  2. Essential hypertension  I10    stable, well controlled, BP at goal today, continue lisinopril-HCTZ and meds per cardiology  3. Gastroesophageal reflux disease, unspecified whether esophagitis present  K21.9    reflux well controlled with PPI  4. Mixed hyperlipidemia  E78.2    well controlled, good statin compliance, last lipids reviewed and LDL was at goal  5. Anxiety  F41.9 LORazepam (ATIVAN) 1 MG tablet    LORazepam (ATIVAN) 1 MG tablet   see below  6. Depression, unspecified depression type  F32.A    mood good today, phq reviewed, better than prior, continue prozac 20 mg daily  7. Encounter for screening mammogram for malignant neoplasm of breast  Z12.31 MM 3D SCREEN BREAST BILATERAL  8. Generalized anxiety disorder with panic attacks  F41.1 LORazepam (ATIVAN) 1 MG tablet   F41.0    sx slightly improved today, continue prozac, limited # ativan, again pt instructed to see psych/therapy, continue self care  9. Diabetes mellitus, type II, insulin dependent (HCC)  E11.9    Z79.4    see above  10. Insomnia, unspecified type  G47.00    continue trazodone, sleep hygiene, can try the muscle relaxers for MSK pain that interferes with sleep - avoid  muscle relaxers and ativan use together  11. High risk medication use  Z79.899    pt has done well to wean down dose and frequency of ativan - no current concern for withdrawal or dependence  12. Lower extremity edema  R60.0    currently well controlled, rarely using lasix  13. Chronic low back pain, unspecified back pain laterality, unspecified whether sciatica present  M54.50    G89.29    can continue to use muscle relaxers, handicap application redone - chronic issue - given 5 year placard  14. Class 3 severe obesity with serious comorbidity and body mass index (BMI) of 40.0 to 44.9 in adult, unspecified obesity type (HCC)  E66.01    Z68.41    pt has been loosing weight, encouraged to continue efforts   All labs reviewed from Jan and March  Return in about 6 months (around 02/04/2021) for Routine follow-up.   Danelle Berry, PA-C 08/04/20 9:05 AM

## 2020-08-07 ENCOUNTER — Ambulatory Visit: Payer: 59 | Admitting: Endocrinology

## 2020-08-15 ENCOUNTER — Other Ambulatory Visit: Payer: Self-pay | Admitting: Family Medicine

## 2020-08-15 DIAGNOSIS — R600 Localized edema: Secondary | ICD-10-CM

## 2020-08-15 DIAGNOSIS — R0602 Shortness of breath: Secondary | ICD-10-CM

## 2020-08-18 ENCOUNTER — Other Ambulatory Visit: Payer: Self-pay | Admitting: Family Medicine

## 2020-08-18 DIAGNOSIS — R0602 Shortness of breath: Secondary | ICD-10-CM

## 2020-08-18 DIAGNOSIS — R600 Localized edema: Secondary | ICD-10-CM

## 2020-09-03 ENCOUNTER — Other Ambulatory Visit: Payer: Self-pay | Admitting: Family Medicine

## 2020-09-03 DIAGNOSIS — R600 Localized edema: Secondary | ICD-10-CM

## 2020-09-03 DIAGNOSIS — R0602 Shortness of breath: Secondary | ICD-10-CM

## 2020-09-25 ENCOUNTER — Other Ambulatory Visit: Payer: Self-pay

## 2020-09-25 ENCOUNTER — Ambulatory Visit (INDEPENDENT_AMBULATORY_CARE_PROVIDER_SITE_OTHER): Payer: 59 | Admitting: Endocrinology

## 2020-09-25 VITALS — BP 110/70 | HR 60 | Ht 63.0 in | Wt 222.2 lb

## 2020-09-25 DIAGNOSIS — E119 Type 2 diabetes mellitus without complications: Secondary | ICD-10-CM

## 2020-09-25 DIAGNOSIS — E1165 Type 2 diabetes mellitus with hyperglycemia: Secondary | ICD-10-CM

## 2020-09-25 DIAGNOSIS — Z794 Long term (current) use of insulin: Secondary | ICD-10-CM

## 2020-09-25 LAB — POCT GLYCOSYLATED HEMOGLOBIN (HGB A1C): Hemoglobin A1C: 6.6 % — AB (ref 4.0–5.6)

## 2020-09-25 MED ORDER — TRULICITY 4.5 MG/0.5ML ~~LOC~~ SOAJ: 4.5000 mg | SUBCUTANEOUS | 3 refills | Status: DC

## 2020-09-25 MED ORDER — LANTUS SOLOSTAR 100 UNIT/ML ~~LOC~~ SOPN: 45.0000 [IU] | PEN_INJECTOR | SUBCUTANEOUS | 3 refills | Status: DC

## 2020-09-25 NOTE — Patient Instructions (Addendum)
check your blood sugar twice a day.  vary the time of day when you check, between before the 3 meals, and at bedtime.  also check if you have symptoms of your blood sugar being too high or too low.  please keep a record of the readings and bring it to your next appointment here (or you can bring the meter itself).  You can write it on any piece of paper.  please call us sooner if your blood sugar goes below 70, or if you have a lot of readings over 200. On this type of insulin schedule, you should eat meals on a regular schedule.  If a meal is missed or significantly delayed, your blood sugar could go low.  I have sent a prescription to your pharmacy, to increase Trulicity again, and:  Please reduce the Lantus to 45 units each morning.   Please come back for a follow-up appointment in 3 months.

## 2020-09-25 NOTE — Progress Notes (Signed)
Subjective:    [name], female    DOB: [birth year], 60 y.o.   MRN: [id number]  HPI Pt returns for f/u of diabetes mellitus: DM type: Insulin-requiring type 2.  Dx'ed: 2004 Complications: none Therapy: insulin since 2018 GDM: 1988 DKA: never Severe hypoglycemia: never Pancreatitis: never Pancreatic imaging: normal on 2013 CT Other: she did not tolerate Invokana (vaginitis); she stopped victoza, due to cost; she declined to continue multiple daily injections.  Interval history: Pt says she takes insulin as rx'ed.  no cbg record, but states cbg's are in the 100's.  Past Medical History:  Diagnosis Date   Allergy    Anxiety 2013   Asthma 2015   Asthma    Depression 2013   Diabetes mellitus 2004   Finding of multiple premature atrial contractions by electrocardiography    GERD (gastroesophageal reflux disease) 2013   Hyperlipidemia 1992   Hypertension 1992   Memory impairment     Past Surgical History:  Procedure Laterality Date   ANTERIOR CRUCIATE LIGAMENT REPAIR  1997   APPENDECTOMY  1987   CARDIAC EVENT MONITOR  03/2019   No average rate 60 bpm.  Max HR 122, minimum HR 36 bpm-sinus bradycardia with PACs and bigeminy.  Frequent PACs seen as isolated, couplets, triplets as well as frequent bigeminy.:   CESAREAN SECTION     [phone]   CHOLECYSTECTOMY  1987   NM MYOVIEW LTD  05/2019   Normal EF 60%.  Poor control hypertension.  (194/101 mmHg).  Small size moderate severity fixed defect in the mid anterior and apical anterior wall likely representing breast attenuation artifact.  No evidence of ischemia (reversibility).  LOW RISK   TRANSTHORACIC ECHOCARDIOGRAM  03/2019   EF 55-60 % with normal wall motion.  Slight LA dilation with grade 1 diastolic function.  Moderate aortic valve calcification-sclerosis without stenosis.  Otherwise normal.   uterine ablation      Social History   Socioeconomic History   Marital status: Married    Spouse name: Not  on file   Number of children: Not on file   Years of education: Not on file   Highest education level: Not on file  Occupational History   Not on file  Tobacco Use   Smoking status: Never   Smokeless tobacco: Never  Substance and Sexual Activity   Alcohol use: No   Drug use: No   Sexual activity: Yes  Other Topics Concern   Not on file  Social History Narrative   Married.    Lives with husband and youngest son, who is 28 y/o.   Has 4 children.   Does not work outside of home.    No formal exercise. Only activity around the house.   Tries to be careful with Diabetic diet.   Social Determinants of Health   Financial Resource Strain: Not on file  Food Insecurity: Not on file  Transportation Needs: Not on file  Physical Activity: Not on file  Stress: Not on file  Social Connections: Not on file  Intimate Partner Violence: Not on file    Current Outpatient Medications on File Prior to Visit  Medication Sig Dispense Refill   aspirin 81 MG tablet Take 1 tablet (81 mg total) by mouth daily. 30 tablet 11   B-D ULTRAFINE III SHORT PEN 31G X 8 MM MISC USE WITH INSULIN 4 TIMES A DAY 200 each 1   CVS D3 2000 units CAPS Take 2 capsules (4,000 Units total) by mouth  daily. 30 each 3   FLUoxetine (PROZAC) 20 MG tablet TAKE 1 TABLET BY MOUTH EVERY DAY 90 tablet 2   furosemide (LASIX) 20 MG tablet TAKE 1 TABLET BY MOUTH DAILY IN MORNING AS NEEDED WITH POTASSIUM FOR LOWER EXTREMITY EDEMA 20 tablet 0   glucose blood (CONTOUR NEXT TEST) test strip 1 each by Other route daily. E11.9 100 each 0   lisinopril-hydrochlorothiazide (ZESTORETIC) 20-12.5 MG tablet Take 1 tablet by mouth daily. 90 tablet 1   LORazepam (ATIVAN) 1 MG tablet Take 0.5 (1/2 tablet) to 1 mg PO up to BID PRN sparingly for anxiety or severe insomnia 45 tablet 0   [START ON 11/04/2020] LORazepam (ATIVAN) 1 MG tablet Take 0.5 (1/2 tablet) to 1 mg PO up to BID PRN sparingly for anxiety or severe insomnia 45 tablet 0   metoprolol  succinate (TOPROL-XL) 25 MG 24 hr tablet TAKE 1 TABLET BY MOUTH EVERY DAY 90 tablet 2   Microlet Lancets MISC 1 each by Does not apply route daily. E11.9     pantoprazole (PROTONIX) 40 MG tablet TAKE 1 TABLET BY MOUTH EVERY DAY 90 tablet 3   rosuvastatin (CRESTOR) 40 MG tablet TAKE 1 TABLET BY MOUTH EVERY DAY 90 tablet 3   tiZANidine (ZANAFLEX) 4 MG tablet SMARTSIG:3 Tablet(s) By Mouth Every Other Day PRN     traZODone (DESYREL) 50 MG tablet TAKE 1/2 UP TO 2 TABLETS BY MOUTH AT BEDTIME AS NEEDED FOR SLEEP 180 tablet 2   No current facility-administered medications on file prior to visit.    Allergies  Allergen Reactions   Contrast Media [Iodinated Diagnostic Agents] Anaphylaxis   Ambien [Zolpidem Tartrate] Other (See Comments)    Family has told her that after taking Ambien, she does "crazy things" that she does not remember doing.    Codeine Nausea And Vomiting    Family History  Problem Relation Age of Onset   Cancer Father        Lymphoma, Leukemia   Depression Father    Cancer Maternal Grandfather    Cancer Paternal Grandmother    Diabetes Paternal Grandfather    Kidney disease Paternal Grandfather     BP 110/70 (BP Location: Right Arm, Patient Position: Sitting, Cuff Size: Large)   Pulse 60   Ht 5\' 3"  (1.6 m)   Wt 222 lb 3.2 oz (100.8 kg)   SpO2 95%   BMI 39.36 kg/m    Review of Systems She denies hypoglycemia.  Nausea is resolved.      Objective:   Physical Exam VITAL SIGNS:  See vs page GENERAL: no distress Pulses: dorsalis pedis intact bilat.   MSK: no deformity of the feet CV: no leg edema Skin:  no ulcer on the feet.  normal color and temp on the feet. Neuro: sensation is intact to touch on the feet.     Lab Results  Component Value Date   HGBA1C 6.6 (A) 09/25/2020       Assessment & Plan:  Insulin-requiring type 2 DM: overcontrolled.   Patient Instructions  check your blood sugar twice a day.  vary the time of day when you check, between before  the 3 meals, and at bedtime.  also check if you have symptoms of your blood sugar being too high or too low.  please keep a record of the readings and bring it to your next appointment here (or you can bring the meter itself).  You can write it on any piece of paper.  please call  us sooner if your blood sugar goes below 70, or if you have a lot of readings over 200. On this type of insulin schedule, you should eat meals on a regular schedule.  If a meal is missed or significantly delayed, your blood sugar could go low.  I have sent a prescription to your pharmacy, to increase Trulicity again, and:  Please reduce the Lantus to 45 units each morning.   Please come back for a follow-up appointment in 3 months.

## 2020-10-10 ENCOUNTER — Other Ambulatory Visit: Payer: Self-pay | Admitting: Family Medicine

## 2020-10-10 DIAGNOSIS — R600 Localized edema: Secondary | ICD-10-CM

## 2020-10-10 DIAGNOSIS — R0602 Shortness of breath: Secondary | ICD-10-CM

## 2020-10-22 ENCOUNTER — Other Ambulatory Visit: Payer: Self-pay | Admitting: Endocrinology

## 2020-10-22 DIAGNOSIS — E1165 Type 2 diabetes mellitus with hyperglycemia: Secondary | ICD-10-CM

## 2020-10-23 ENCOUNTER — Ambulatory Visit: Payer: 59 | Admitting: Cardiology

## 2020-11-08 ENCOUNTER — Other Ambulatory Visit: Payer: Self-pay | Admitting: Family Medicine

## 2020-11-08 DIAGNOSIS — R600 Localized edema: Secondary | ICD-10-CM

## 2020-11-08 DIAGNOSIS — G4700 Insomnia, unspecified: Secondary | ICD-10-CM

## 2020-11-08 DIAGNOSIS — R0602 Shortness of breath: Secondary | ICD-10-CM

## 2020-11-15 ENCOUNTER — Other Ambulatory Visit: Payer: Self-pay | Admitting: Endocrinology

## 2020-11-15 DIAGNOSIS — E119 Type 2 diabetes mellitus without complications: Secondary | ICD-10-CM

## 2020-11-15 DIAGNOSIS — Z794 Long term (current) use of insulin: Secondary | ICD-10-CM

## 2020-11-19 ENCOUNTER — Other Ambulatory Visit: Payer: Self-pay | Admitting: Family Medicine

## 2020-11-19 DIAGNOSIS — E785 Hyperlipidemia, unspecified: Secondary | ICD-10-CM

## 2020-11-19 DIAGNOSIS — I10 Essential (primary) hypertension: Secondary | ICD-10-CM

## 2020-11-19 DIAGNOSIS — R600 Localized edema: Secondary | ICD-10-CM

## 2020-11-19 DIAGNOSIS — R0602 Shortness of breath: Secondary | ICD-10-CM

## 2020-11-30 ENCOUNTER — Other Ambulatory Visit: Payer: Self-pay | Admitting: Family Medicine

## 2020-11-30 DIAGNOSIS — F419 Anxiety disorder, unspecified: Secondary | ICD-10-CM

## 2020-12-02 ENCOUNTER — Other Ambulatory Visit: Payer: Self-pay | Admitting: Family Medicine

## 2020-12-02 DIAGNOSIS — R0602 Shortness of breath: Secondary | ICD-10-CM

## 2020-12-02 DIAGNOSIS — R600 Localized edema: Secondary | ICD-10-CM

## 2020-12-12 ENCOUNTER — Telehealth: Payer: Self-pay | Admitting: Cardiology

## 2020-12-12 ENCOUNTER — Other Ambulatory Visit: Payer: Self-pay

## 2020-12-12 DIAGNOSIS — I491 Atrial premature depolarization: Secondary | ICD-10-CM

## 2020-12-12 DIAGNOSIS — R002 Palpitations: Secondary | ICD-10-CM

## 2020-12-12 MED ORDER — METOPROLOL SUCCINATE ER 25 MG PO TB24: 25.0000 mg | ORAL_TABLET | Freq: Every day | ORAL | 2 refills | Status: DC

## 2020-12-12 NOTE — Telephone Encounter (Signed)
*  STAT* If patient is at the pharmacy, call can be transferred to refill team.   1. Which medications need to be refilled? (please list name of each medication and dose if known) metoprolol succinate (TOPROL-XL) 25 MG 24 hr tablet  2. Which pharmacy/location (including street and city if local pharmacy) is medication to be sent to? [id number] [place], [place] - [address]  3. Do they need a 30 day or 90 day supply? 90ds

## 2020-12-31 ENCOUNTER — Other Ambulatory Visit: Payer: Self-pay

## 2020-12-31 ENCOUNTER — Other Ambulatory Visit: Payer: Self-pay | Admitting: Family Medicine

## 2020-12-31 ENCOUNTER — Ambulatory Visit: Payer: 59 | Admitting: Endocrinology

## 2020-12-31 VITALS — BP 100/60 | HR 60 | Ht 63.0 in | Wt 223.4 lb

## 2020-12-31 DIAGNOSIS — E1165 Type 2 diabetes mellitus with hyperglycemia: Secondary | ICD-10-CM | POA: Diagnosis not present

## 2020-12-31 DIAGNOSIS — Z794 Long term (current) use of insulin: Secondary | ICD-10-CM | POA: Diagnosis not present

## 2020-12-31 DIAGNOSIS — R0602 Shortness of breath: Secondary | ICD-10-CM

## 2020-12-31 DIAGNOSIS — R600 Localized edema: Secondary | ICD-10-CM

## 2020-12-31 DIAGNOSIS — E119 Type 2 diabetes mellitus without complications: Secondary | ICD-10-CM

## 2020-12-31 LAB — POCT GLYCOSYLATED HEMOGLOBIN (HGB A1C): Hemoglobin A1C: 6.8 % — AB (ref 4.0–5.6)

## 2020-12-31 MED ORDER — LANTUS SOLOSTAR 100 UNIT/ML ~~LOC~~ SOPN: 62.0000 [IU] | PEN_INJECTOR | SUBCUTANEOUS | 3 refills | Status: DC

## 2020-12-31 MED ORDER — LANTUS SOLOSTAR 100 UNIT/ML ~~LOC~~ SOPN: 42.0000 [IU] | PEN_INJECTOR | SUBCUTANEOUS | 3 refills | Status: DC

## 2020-12-31 NOTE — Telephone Encounter (Signed)
Requested medication (s) are due for refill today: Yes  Requested medication (s) are on the active medication list: Yes  Last refill:  11/19/20  Future visit scheduled: Yes  Notes to clinic:  See request.    Requested Prescriptions  Pending Prescriptions Disp Refills   furosemide (LASIX) 20 MG tablet [Pharmacy Med Name: FUROSEMIDE 20 MG TABLET] 20 tablet 0    Sig: TAKE 1 TABLET BY MOUTH DAILY IN MORNING AS NEEDED WITH POTASSIUM FOR LOWER EXTREMITY EDEMA     Cardiovascular:  Diuretics - Loop Passed - 12/31/2020 11:18 AM      Passed - K in normal range and within 360 days    Potassium  Date Value Ref Range Status  05/01/2020 4.3 3.5 - 5.3 mmol/L Final          Passed - Ca in normal range and within 360 days    Calcium  Date Value Ref Range Status  05/01/2020 9.6 8.6 - 10.4 mg/dL Final   Calcium, Ion  Date Value Ref Range Status  07/31/2011 1.24 1.12 - 1.32 mmol/L Final          Passed - Na in normal range and within 360 days    Sodium  Date Value Ref Range Status  05/01/2020 140 135 - 146 mmol/L Final          Passed - Cr in normal range and within 360 days    Creat  Date Value Ref Range Status  05/01/2020 0.75 0.50 - 1.05 mg/dL Final    Comment:    For patients >92 years of age, the reference limit for Creatinine is approximately 13% higher for people identified as African-American. .           Passed - Last BP in normal range    BP Readings from Last 1 Encounters:  09/25/20 110/70          Passed - Valid encounter within last 6 months    Recent Outpatient Visits           4 months ago Type 2 diabetes mellitus with hyperglycemia, without long-term current use of insulin [facility])   [facility] Danelle Berry, PA-C   8 months ago Essential hypertension   [facility] Danelle Berry, PA-C   1 year ago Lower extremity edema   [facility] Danelle Berry, PA-C   1 year ago Type 2 diabetes mellitus  without complication, with long-term current use of insulin [facility])   [facility] Danelle Berry, PA-C   1 year ago Encounter for screening mammogram for malignant neoplasm of breast   [facility] Danelle Berry, PA-C       Future Appointments             In 3 weeks Marykay Lex, MD [facility] [place], [facility]   In 1 month Danelle Berry, PA-C [facility], [facility]

## 2020-12-31 NOTE — Patient Instructions (Addendum)
check your blood sugar twice a day.  vary the time of day when you check, between before the 3 meals, and at bedtime.  also check if you have symptoms of your blood sugar being too high or too low.  please keep a record of the readings and bring it to your next appointment here (or you can bring the meter itself).  You can write it on any piece of paper.  please call us sooner if your blood sugar goes below 70, or if you have a lot of readings over 200. On this type of insulin schedule, you should eat meals on a regular schedule.  If a meal is missed or significantly delayed, your blood sugar could go low.  Please reduce the Lantus to 62 units each morning.   Please continue the same Trulicity Please come back for a follow-up appointment in January.

## 2020-12-31 NOTE — Progress Notes (Signed)
Subjective:    [name], female    DOB: [birth year], 60 y.o.   MRN: [id number]  HPI Pt returns for f/u of diabetes mellitus:  DM type: Insulin-requiring type 2.  Dx'ed: 2004 Complications: none Therapy: insulin since 2018, and Trulicity.   GDM: 1988 DKA: never Severe hypoglycemia: never Pancreatitis: never Pancreatic imaging: normal on 2013 CT Other: she did not tolerate Invokana (vaginitis); she declined to continue multiple daily injections.  Interval history: Pt says she takes insulin as rx'ed.  no cbg record, but states cbg's are in the low-100's. She take Lantus, 65 units qam.   Past Medical History:  Diagnosis Date   Allergy    Anxiety 2013   Asthma 2015   Asthma    Depression 2013   Diabetes mellitus 2004   Finding of multiple premature atrial contractions by electrocardiography    GERD (gastroesophageal reflux disease) 2013   Hyperlipidemia 1992   Hypertension 1992   Memory impairment     Past Surgical History:  Procedure Laterality Date   ANTERIOR CRUCIATE LIGAMENT REPAIR  1997   APPENDECTOMY  1987   CARDIAC EVENT MONITOR  03/2019   No average rate 60 bpm.  Max HR 122, minimum HR 36 bpm-sinus bradycardia with PACs and bigeminy.  Frequent PACs seen as isolated, couplets, triplets as well as frequent bigeminy.:   CESAREAN SECTION     [phone]   CHOLECYSTECTOMY  1987   NM MYOVIEW LTD  05/2019   Normal EF 60%.  Poor control hypertension.  (194/101 mmHg).  Small size moderate severity fixed defect in the mid anterior and apical anterior wall likely representing breast attenuation artifact.  No evidence of ischemia (reversibility).  LOW RISK   TRANSTHORACIC ECHOCARDIOGRAM  03/2019   EF 55-60 % with normal wall motion.  Slight LA dilation with grade 1 diastolic function.  Moderate aortic valve calcification-sclerosis without stenosis.  Otherwise normal.   uterine ablation      Social History   Socioeconomic History   Marital status: Married     Spouse name: Not on file   Number of children: Not on file   Years of education: Not on file   Highest education level: Not on file  Occupational History   Not on file  Tobacco Use   Smoking status: Never   Smokeless tobacco: Never  Substance and Sexual Activity   Alcohol use: No   Drug use: No   Sexual activity: Yes  Other Topics Concern   Not on file  Social History Narrative   Married.    Lives with husband and youngest son, who is 90 y/o.   Has 4 children.   Does not work outside of home.    No formal exercise. Only activity around the house.   Tries to be careful with Diabetic diet.   Social Determinants of Health   Financial Resource Strain: Not on file  Food Insecurity: Not on file  Transportation Needs: Not on file  Physical Activity: Not on file  Stress: Not on file  Social Connections: Not on file  Intimate Partner Violence: Not on file    Current Outpatient Medications on File Prior to Visit  Medication Sig Dispense Refill   aspirin 81 MG tablet Take 1 tablet (81 mg total) by mouth daily. 30 tablet 11   B-D ULTRAFINE III SHORT PEN 31G X 8 MM MISC USE WITH INSULIN 4 TIMES A DAY 200 each 1   [employer]S D3 2000 units CAPS Take 2  capsules (4,000 Units total) by mouth daily. 30 each 3   Dulaglutide (TRULICITY) 4.5 MG/0.5ML SOPN Inject 4.5 mg as directed once a week. 6 mL 3   FLUoxetine (PROZAC) 20 MG tablet TAKE 1 TABLET BY MOUTH EVERY DAY 90 tablet 2   furosemide (LASIX) 20 MG tablet TAKE 1 TABLET BY MOUTH DAILY IN MORNING AS NEEDED WITH POTASSIUM FOR LOWER EXTREMITY EDEMA 20 tablet 0   glucose blood (CONTOUR NEXT TEST) test strip 1 each by Other route daily. E11.9 100 each 0   lisinopril-hydrochlorothiazide (ZESTORETIC) 20-12.5 MG tablet TAKE 1 TABLET BY MOUTH EVERY DAY 90 tablet 1   LORazepam (ATIVAN) 1 MG tablet TAKE 1/2 TABLET TO 1 TABLET UP TO TWICE A DAY AS NEEDED SPARINGLY FOR ANXIETY OR SEVERE INSOMNIA 45 tablet 0   metoprolol succinate (TOPROL-XL) 25 MG 24 hr  tablet Take 1 tablet (25 mg total) by mouth daily. 90 tablet 2   Microlet Lancets MISC 1 each by Does not apply route daily. E11.9     pantoprazole (PROTONIX) 40 MG tablet TAKE 1 TABLET BY MOUTH EVERY DAY 90 tablet 3   rosuvastatin (CRESTOR) 40 MG tablet TAKE 1 TABLET BY MOUTH EVERY DAY 90 tablet 3   tiZANidine (ZANAFLEX) 4 MG tablet SMARTSIG:3 Tablet(s) By Mouth Every Other Day PRN     traZODone (DESYREL) 50 MG tablet TAKE 1/2 UP TO 2 TABLETS BY MOUTH AT BEDTIME AS NEEDED FOR SLEEP 180 tablet 2   No current facility-administered medications on file prior to visit.    Allergies  Allergen Reactions   Contrast Media [Iodinated Diagnostic Agents] Anaphylaxis   Ambien [Zolpidem Tartrate] Other (See Comments)    Family has told her that after taking Ambien, she does "crazy things" that she does not remember doing.    Codeine Nausea And Vomiting    Family History  Problem Relation Age of Onset   Cancer Father        Lymphoma, Leukemia   Depression Father    Cancer Maternal Grandfather    Cancer Paternal Grandmother    Diabetes Paternal Grandfather    Kidney disease Paternal Grandfather     BP 100/60 (BP Location: Right Arm, Patient Position: Sitting, Cuff Size: Large)   Pulse 60   Ht 5\' 3"  (1.6 m)   Wt 223 lb 6.4 oz (101.3 kg)   SpO2 99%   BMI 39.57 kg/m    Review of Systems She denies hypoglycemia.      Objective:   Physical Exam Pulses: dorsalis pedis intact bilat.   MSK: no deformity of the feet CV: no leg edema.   Skin:  no ulcer on the feet.  normal color and temp on the feet.  Neuro: sensation is intact to touch on the feet.     Lab Results  Component Value Date   HGBA1C 6.8 (A) 12/31/2020      Assessment & Plan:  Insulin-requiring type 2 DM: overcontrolled  Patient Instructions  check your blood sugar twice a day.  vary the time of day when you check, between before the 3 meals, and at bedtime.  also check if you have symptoms of your blood sugar being too  high or too low.  please keep a record of the readings and bring it to your next appointment here (or you can bring the meter itself).  You can write it on any piece of paper.  please call 01/02/2021 sooner if your blood sugar goes below 70, or if you have a lot  of readings over 200. On this type of insulin schedule, you should eat meals on a regular schedule.  If a meal is missed or significantly delayed, your blood sugar could go low.  Please reduce the Lantus to 62 units each morning.   Please continue the same Trulicity Please come back for a follow-up appointment in January.

## 2021-01-04 ENCOUNTER — Emergency Department (HOSPITAL_COMMUNITY): Payer: 59

## 2021-01-04 ENCOUNTER — Emergency Department (HOSPITAL_COMMUNITY)
Admission: EM | Admit: 2021-01-04 | Discharge: 2021-01-05 | Disposition: A | Discharge status: Home / Self Care | Payer: 59 | Attending: Emergency Medicine | Admitting: Emergency Medicine

## 2021-01-04 ENCOUNTER — Other Ambulatory Visit: Payer: Self-pay

## 2021-01-04 DIAGNOSIS — W19XXXA Unspecified fall, initial encounter: Secondary | ICD-10-CM | POA: Insufficient documentation

## 2021-01-04 DIAGNOSIS — J45909 Unspecified asthma, uncomplicated: Secondary | ICD-10-CM | POA: Diagnosis not present

## 2021-01-04 DIAGNOSIS — I10 Essential (primary) hypertension: Secondary | ICD-10-CM | POA: Diagnosis not present

## 2021-01-04 DIAGNOSIS — S59901A Unspecified injury of right elbow, initial encounter: Secondary | ICD-10-CM | POA: Diagnosis present

## 2021-01-04 DIAGNOSIS — E119 Type 2 diabetes mellitus without complications: Secondary | ICD-10-CM | POA: Diagnosis not present

## 2021-01-04 DIAGNOSIS — Z79899 Other long term (current) drug therapy: Secondary | ICD-10-CM | POA: Diagnosis not present

## 2021-01-04 DIAGNOSIS — S5001XA Contusion of right elbow, initial encounter: Secondary | ICD-10-CM | POA: Diagnosis not present

## 2021-01-04 DIAGNOSIS — Z794 Long term (current) use of insulin: Secondary | ICD-10-CM | POA: Insufficient documentation

## 2021-01-04 DIAGNOSIS — M25511 Pain in right shoulder: Secondary | ICD-10-CM | POA: Diagnosis not present

## 2021-01-04 DIAGNOSIS — M79601 Pain in right arm: Secondary | ICD-10-CM | POA: Insufficient documentation

## 2021-01-04 DIAGNOSIS — Z7982 Long term (current) use of aspirin: Secondary | ICD-10-CM | POA: Diagnosis not present

## 2021-01-04 NOTE — ED Triage Notes (Signed)
Pt fell Saturday onto R arm/ elbow, -LOC, - hitting head. CMS intact distal to injury, but pt states she has "toothache, sore" feeling from neck to fingertips. No obvious deformity noted. Pt also endorses R knee & hip pain, but "elbow is the worst."  5/10 achy, sore

## 2021-01-05 ENCOUNTER — Emergency Department (HOSPITAL_COMMUNITY): Payer: 59

## 2021-01-05 NOTE — ED Provider Notes (Signed)
[facility] EMERGENCY DEPARTMENT Provider Note   CSN: [id number] Arrival date & time: 01/04/21  2148     History Chief Complaint  Patient presents with   Alicia Pope    Alicia Pope is a 60 y.o. female.  60 year old female with a history of diabetes, hyperlipidemia, hypertension presents to the emergency department for evaluation of pain to her right upper extremity.  She had a mechanical fall on Saturday evening.  Denies head trauma, loss of consciousness.  Has been experiencing persistent pain to her right arm, especially her right elbow and right shoulder.  She describes the pain as a soreness, like a "tooth ache".  She took some ibuprofen prior to arrival without relief of her discomfort.  She has been ambulatory since her fall without difficulty.  Rates her pain at 5/10.  The history is provided by the patient. No language interpreter was used.  Fall      Past Medical History:  Diagnosis Date   Allergy    Anxiety 2013   Asthma 2015   Asthma    Depression 2013   Diabetes mellitus 2004   Finding of multiple premature atrial contractions by electrocardiography    GERD (gastroesophageal reflux disease) 2013   Hyperlipidemia 1992   Hypertension 1992   Memory impairment     Patient Active Problem List   Diagnosis Date Noted   Chronic low back pain 08/04/2020   High risk medication use 05/01/2020   Aortic valve sclerosis 05/01/2020   PAC (premature atrial contraction) 04/24/2019   Sinus bradycardia, persistent 04/24/2019   Exertional dyspnea 04/24/2019   Systolic ejection murmur 02/20/2019   Diabetes mellitus, type II, insulin dependent (HCC) 04/29/2016   Insomnia 04/02/2015   Class 3 severe obesity with serious comorbidity and body mass index (BMI) of 40.0 to 44.9 in adult (HCC) 09/23/2014   Vitamin D deficiency 09/23/2014   Screening for colorectal cancer 09/23/2014   Anxiety    Depression    GERD (gastroesophageal reflux disease)    Essential  hypertension    Hyperlipidemia     Past Surgical History:  Procedure Laterality Date   ANTERIOR CRUCIATE LIGAMENT REPAIR  1997   APPENDECTOMY  1987   CARDIAC EVENT MONITOR  03/2019   No average rate 60 bpm.  Max HR 122, minimum HR 36 bpm-sinus bradycardia with PACs and bigeminy.  Frequent PACs seen as isolated, couplets, triplets as well as frequent bigeminy.:   CESAREAN SECTION     [phone]   CHOLECYSTECTOMY  1987   NM MYOVIEW LTD  05/2019   Normal EF 60%.  Poor control hypertension.  (194/101 mmHg).  Small size moderate severity fixed defect in the mid anterior and apical anterior wall likely representing breast attenuation artifact.  No evidence of ischemia (reversibility).  LOW RISK   TRANSTHORACIC ECHOCARDIOGRAM  03/2019   EF 55-60 % with normal wall motion.  Slight LA dilation with grade 1 diastolic function.  Moderate aortic valve calcification-sclerosis without stenosis.  Otherwise normal.   uterine ablation       OB History   No obstetric history on file.     Family History  Problem Relation Age of Onset   Cancer Father        Lymphoma, Leukemia   Depression Father    Cancer Maternal Grandfather    Cancer Paternal Grandmother    Diabetes Paternal Grandfather    Kidney disease Paternal Grandfather     Social History   Tobacco Use   Smoking status:  Never   Smokeless tobacco: Never  Substance Use Topics   Alcohol use: No   Drug use: No    Home Medications Prior to Admission medications   Medication Sig Start Date End Date Taking? Authorizing Provider  aspirin 81 MG tablet Take 1 tablet (81 mg total) by mouth daily. 11/05/13   Dixon, Corrie Dandy B, PA-C  B-D ULTRAFINE III SHORT PEN 31G X 8 MM MISC USE WITH INSULIN 4 TIMES A DAY 11/18/20   Romero Belling, MD  CVS D3 2000 units CAPS Take 2 capsules (4,000 Units total) by mouth daily. 07/29/16   Dixon, Patriciaann Clan, PA-C  Dulaglutide (TRULICITY) 4.5 MG/0.5ML SOPN Inject 4.5 mg as directed once a week. 09/25/20   Romero Belling,  MD  FLUoxetine (PROZAC) 20 MG tablet TAKE 1 TABLET BY MOUTH EVERY DAY 07/01/20   Danelle Berry, PA-C  furosemide (LASIX) 20 MG tablet TAKE 1 TABLET BY MOUTH DAILY IN MORNING AS NEEDED WITH POTASSIUM FOR LOWER EXTREMITY EDEMA 11/19/20   Danelle Berry, PA-C  glucose blood (CONTOUR NEXT TEST) test strip 1 each by Other route daily. E11.9 11/27/19   Romero Belling, MD  insulin glargine (LANTUS SOLOSTAR) 100 UNIT/ML Solostar Pen Inject 62 Units into the skin every morning. 12/31/20   Romero Belling, MD  lisinopril-hydrochlorothiazide (ZESTORETIC) 20-12.5 MG tablet TAKE 1 TABLET BY MOUTH EVERY DAY 11/19/20   Danelle Berry, PA-C  LORazepam (ATIVAN) 1 MG tablet TAKE 1/2 TABLET TO 1 TABLET UP TO TWICE A DAY AS NEEDED SPARINGLY FOR ANXIETY OR SEVERE INSOMNIA 12/15/20   Danelle Berry, PA-C  metoprolol succinate (TOPROL-XL) 25 MG 24 hr tablet Take 1 tablet (25 mg total) by mouth daily. 12/12/20   Marykay Lex, MD  Microlet Lancets MISC 1 each by Does not apply route daily. E11.9    [provider]  pantoprazole (PROTONIX) 40 MG tablet TAKE 1 TABLET BY MOUTH EVERY DAY 04/07/20   Danelle Berry, PA-C  rosuvastatin (CRESTOR) 40 MG tablet TAKE 1 TABLET BY MOUTH EVERY DAY 11/19/20   Danelle Berry, PA-C  tiZANidine (ZANAFLEX) 4 MG tablet SMARTSIG:3 Tablet(s) By Mouth Every Other Day PRN 07/02/19   [provider]  traZODone (DESYREL) 50 MG tablet TAKE 1/2 UP TO 2 TABLETS BY MOUTH AT BEDTIME AS NEEDED FOR SLEEP 11/10/20   Danelle Berry, PA-C    Allergies    Contrast media [iodinated diagnostic agents], Ambien [zolpidem tartrate], and Codeine  Review of Systems   Review of Systems Ten systems reviewed and are negative for acute change, except as noted in the HPI.    Physical Exam Updated Vital Signs BP (!) 178/76 (BP Location: Left Arm)   Pulse 66   Temp 98.9 F (37.2 C)   Resp 15   SpO2 99%   Physical Exam Vitals and nursing note reviewed.  Constitutional:      General: She is not in acute distress.     Appearance: She is well-developed. She is not diaphoretic.     Comments: Nontoxic appearing and in NAD  HENT:     Head: Normocephalic and atraumatic.  Eyes:     General: No scleral icterus.    Conjunctiva/sclera: Conjunctivae normal.  Pulmonary:     Effort: Pulmonary effort is normal. No respiratory distress.  Musculoskeletal:        General: Normal range of motion.       Arms:     Cervical back: Normal range of motion.     Comments: Contusion to distal aspect of R elbow  without bony deformity or crepitus. AROM and PROM of R elbow preserved with flexion and extension. TTP to R shoulder without crepitus or deformity.  Skin:    General: Skin is warm and dry.     Coloration: Skin is not pale.     Findings: No erythema or rash.  Neurological:     Mental Status: She is alert and oriented to person, place, and time.     Comments: Grip strength 5/5 in the right hand  Psychiatric:        Mood and Affect: Mood is anxious.        Behavior: Behavior normal.    ED Results / Procedures / Treatments   Labs (all labs ordered are listed, but only abnormal results are displayed) Labs Reviewed - No data to display  EKG None  Radiology DG Shoulder Right  Result Date: 01/05/2021 CLINICAL DATA:  Recent fall with right shoulder pain, initial encounter EXAM: RIGHT SHOULDER - 2+ VIEW COMPARISON:  None. FINDINGS: Mild degenerative changes of the acromioclavicular joint are noted. Humeral head is well seated. No acute fracture or dislocation is noted. Underlying bony thorax is within normal limits. No soft tissue abnormality is seen. IMPRESSION: No acute abnormality noted. Electronically Signed   By: Alcide Clever M.D.   On: 01/05/2021 02:01   DG ELBOW COMPLETE RIGHT (3+VIEW)  Result Date: 01/04/2021 CLINICAL DATA:  Fall 1 day ago with right elbow pain, initial encounter EXAM: RIGHT ELBOW - COMPLETE 3+ VIEW COMPARISON:  None. FINDINGS: There is no evidence of fracture, dislocation, or joint effusion.  There is no evidence of arthropathy or other focal bone abnormality. Soft tissues are unremarkable. IMPRESSION: No acute abnormality noted. Electronically Signed   By: Alcide Clever M.D.   On: 01/04/2021 23:38    Procedures Procedures   Medications Ordered in ED Medications - No data to display  ED Course  I have reviewed the triage vital signs and the nursing notes.  Pertinent labs & imaging results that were available during my care of the patient were reviewed by me and considered in my medical decision making (see chart for details).    MDM Rules/Calculators/A&P                           Patient presents to the emergency department for evaluation of RUE pain secondary to a mechanical fall. Patient neurovascularly intact on exam. Imaging negative for fracture, dislocation, bony deformity. Compartments in the affected extremity are soft. Plan for supportive management including RICE and NSAIDs; primary care follow up as needed. Return precautions discussed and provided. Patient discharged in stable condition with no unaddressed concerns.   Final Clinical Impression(s) / ED Diagnoses Final diagnoses:  Right arm pain    Rx / DC Orders ED Discharge Orders     None        Antony Madura, PA-C 01/05/21 [id number]    Tilden Fossa, MD 01/05/21 [phone]

## 2021-01-05 NOTE — Discharge Instructions (Addendum)
Your x-rays were reassuring and negative for fracture/broken bone.  You were given a sling for comfort.  Continue with use of anti-inflammatories such as ibuprofen or Aleve for pain.  Follow-up with your primary doctor.

## 2021-01-15 ENCOUNTER — Other Ambulatory Visit: Payer: Self-pay | Admitting: Family Medicine

## 2021-01-15 DIAGNOSIS — R600 Localized edema: Secondary | ICD-10-CM

## 2021-01-15 DIAGNOSIS — R0602 Shortness of breath: Secondary | ICD-10-CM

## 2021-01-21 ENCOUNTER — Other Ambulatory Visit: Payer: Self-pay

## 2021-01-21 ENCOUNTER — Encounter: Payer: Self-pay | Admitting: Cardiology

## 2021-01-21 ENCOUNTER — Ambulatory Visit (INDEPENDENT_AMBULATORY_CARE_PROVIDER_SITE_OTHER): Payer: 59 | Admitting: Cardiology

## 2021-01-21 VITALS — BP 170/80 | HR 67 | Ht 62.0 in | Wt 222.8 lb

## 2021-01-21 DIAGNOSIS — I10 Essential (primary) hypertension: Secondary | ICD-10-CM | POA: Diagnosis not present

## 2021-01-21 DIAGNOSIS — I491 Atrial premature depolarization: Secondary | ICD-10-CM | POA: Diagnosis not present

## 2021-01-21 DIAGNOSIS — R002 Palpitations: Secondary | ICD-10-CM

## 2021-01-21 DIAGNOSIS — Z6841 Body Mass Index (BMI) 40.0 and over, adult: Secondary | ICD-10-CM

## 2021-01-21 DIAGNOSIS — E1169 Type 2 diabetes mellitus with other specified complication: Secondary | ICD-10-CM

## 2021-01-21 DIAGNOSIS — E785 Hyperlipidemia, unspecified: Secondary | ICD-10-CM

## 2021-01-21 NOTE — Patient Instructions (Signed)
Medication Instructions:  No changes  Except if your blood pressure is elevated like it is today , recheck in one hour  if still elevated take an additional Toprol Xl 25  mg    *If you need a refill on your cardiac medications before your next appointment, please call your pharmacy*   Lab Work: Not needed   Testing/Procedures: Not needed   Follow-Up: At [facility], you and your health needs are our priority.  As part of our continuing mission to provide you with exceptional heart care, we have created designated Provider Care Teams.  These Care Teams include your primary Cardiologist (physician) and Advanced Practice Providers (APPs -  Physician Assistants and Nurse Practitioners) who all work together to provide you with the care you need, when you need it.  We recommend signing up for the patient portal called "MyChart".  Sign up information is provided on this After Visit Summary.  MyChart is used to connect with patients for Virtual Visits (Telemedicine).  Patients are able to view lab/test results, encounter notes, upcoming appointments, etc.  Non-urgent messages can be sent to your provider as well.   To learn more about what you can do with MyChart, go to [email or web address].    Your next appointment:   12 month(s)  The format for your next appointment:   In Person  Provider:   Bryan Lemma, MD

## 2021-01-21 NOTE — Progress Notes (Signed)
Primary Care Provider: Danelle Berry, PA-C Cardiologist: Bryan Lemma, MD Electrophysiologist: None  Clinic Note: Chief Complaint  Patient presents with   Follow-up    ~18 month    ===================================  ASSESSMENT/PLAN   Problem List Items Addressed This Visit       Cardiology Problems   Essential hypertension (Chronic)    Blood pressures are very high today.  Likely sure what the issue is.  She is on a high dose of lisinopril-HCTZ as well as metoprolol.  This is a unusual thing for her I think she said that she was running late getting here and was very stressed out.  I have asked that she monitor her blood pressures at home.  If they do get elevated, she can double up dose of Toprol if necessary.  Otherwise we may need to consider additional medications.      Relevant Orders   EKG 12-Lead (Completed)   Hyperlipidemia associated with type 2 diabetes mellitus (HCC) (Chronic)    On high-dose rosuvastatin.  LDL 89.  Stable.  Due for recheck in January.  Continue with diet and exercise with plans for weight loss.  A1c 6.8.  As a result, triglycerides are elevated.  With better glycemic control, this will improve as well.      PAC (premature atrial contraction) - Primary (Chronic)    Overall, pretty well controlled.  She does have some resting bradycardia, was a bit reluctant to push up too high.  Certainly she has room if she needs to take an additional dose for elevated blood pressures as they are elevated today.  She can also take additional doses if she is having a bad spell.      Relevant Orders   EKG 12-Lead (Completed)     Other   Class 3 severe obesity with serious comorbidity and body mass index (BMI) of 40.0 to 44.9 in adult [facility]) (Chronic)    The patient understands the need to lose weight with diet and exercise. We have discussed specific strategies for this.       Other Visit Diagnoses     Palpitations       Relevant Orders   EKG  12-Lead (Completed)       ===================================  HPI:    Alicia Pope is a 60 y.o. female with a PMH notable for HTN, HLD, DM-2 and Symptomatic PACs/Palpitations below who presents today for 72-month follow-up.  Alicia Pope was originally seen in November 2020 to evaluate palpitations and noted significant improvement Toprol.  => Initial evaluation with GXT borderline, recommended nuclear stress test which was performed and showed breast attenuation but low risk. She was most recently seen on July 16, 2019-actually feeling much better.  Palpitations are very well controlled.  But she did notice if she missed doses of Toprol.  Palpitations do occur mostly at night - described as forceful beats.  Still notes off & on CP -> not associated with rest or activity.   Recent Hospitalizations: none  Reviewed  CV studies:    The following studies were reviewed today: (if available, images/films reviewed: From Epic Chart or Care Everywhere) No new studies  Interval History:   Alicia Pope returns here today for delayed follow-up stating that she is doing pretty well.  Her palpitations still come and go but nothing that bothers her now.  She says that he still has the every now and then episodes of chest pain again not associate with activity or rest.  Nothing different.  Her blood pressure is high today, but not usually. She has been care-free - doing what ever she wants to do.  CV Review of Symptoms (Summary) Cardiovascular ROS: positive for - chest pain, palpitations, and no change from baseline - nothing concerning negative for - edema, irregular heartbeat, orthopnea, palpitations, paroxysmal nocturnal dyspnea, rapid heart rate, shortness of breath, or syncope/near syncope or TIA/ amaurosis fugax, or claudicaiton  REVIEWED OF SYSTEMS   Review of Systems  Constitutional:  Negative for malaise/fatigue and weight loss.  HENT:  Negative for congestion and  nosebleeds.   Respiratory:  Negative for cough and shortness of breath.   Cardiovascular:        Per HPI  Gastrointestinal:  Negative for blood in stool and melena.  Genitourinary:  Negative for hematuria.  Musculoskeletal:  Negative for joint pain.  Neurological:  Negative for dizziness and weakness.  Psychiatric/Behavioral:  Negative for depression and memory loss. The patient is nervous/anxious. The patient does not have insomnia.    I have reviewed and (if needed) personally updated the patient's problem list, medications, allergies, past medical and surgical history, social and family history.   PAST MEDICAL HISTORY   Past Medical History:  Diagnosis Date   Allergy    Anxiety 2013   Asthma 2015   Asthma    Depression 2013   Diabetes mellitus 2004   Finding of multiple premature atrial contractions by electrocardiography    GERD (gastroesophageal reflux disease) 2013   Hyperlipidemia 1992   Hypertension 1992   Memory impairment     PAST SURGICAL HISTORY   Past Surgical History:  Procedure Laterality Date   ANTERIOR CRUCIATE LIGAMENT REPAIR  1997   APPENDECTOMY  1987   CARDIAC EVENT MONITOR  03/2019   No average rate 60 bpm.  Max HR 122, minimum HR 36 bpm-sinus bradycardia with PACs and bigeminy.  Frequent PACs seen as isolated, couplets, triplets as well as frequent bigeminy.:   CESAREAN SECTION     [phone]   CHOLECYSTECTOMY  1987   NM MYOVIEW LTD  05/2019   Normal EF 60%.  Poor control hypertension.  (194/101 mmHg).  Small size moderate severity fixed defect in the mid anterior and apical anterior wall likely representing breast attenuation artifact.  No evidence of ischemia (reversibility).  LOW RISK   TRANSTHORACIC ECHOCARDIOGRAM  03/2019   EF 55-60 % with normal wall motion.  Slight LA dilation with grade 1 diastolic function.  Moderate aortic valve calcification-sclerosis without stenosis.  Otherwise normal.   uterine ablation      Immunization History   Administered Date(s) Administered   Influenza Inj Mdck Quad Pf 12/23/2017   Influenza,inj,Quad PF,6+ Mos 03/11/2014, 12/25/2014, 01/19/2016, 01/05/2017, 01/11/2019, 05/01/2020, 02/05/2021   Influenza-Unspecified 12/23/2017   Pneumococcal Polysaccharide-23 11/05/2013, 12/23/2017   Tdap 11/05/2013    MEDICATIONS/ALLERGIES   Current Meds  Medication Sig   aspirin 81 MG tablet Take 1 tablet (81 mg total) by mouth daily.   B-D ULTRAFINE III SHORT PEN 31G X 8 MM MISC USE WITH INSULIN 4 TIMES A DAY   [employer] D3 2000 units CAPS Take 2 capsules (4,000 Units total) by mouth daily.   Dulaglutide (TRULICITY) 4.5 MG/0.5ML SOPN Inject 4.5 mg as directed once a week.   FLUoxetine (PROZAC) 20 MG tablet TAKE 1 TABLET BY MOUTH EVERY DAY   glucose blood (CONTOUR NEXT TEST) test strip 1 each by Other route daily. E11.9   insulin glargine (LANTUS SOLOSTAR) 100 UNIT/ML Solostar Pen Inject 62 Units  into the skin every morning.   lisinopril-hydrochlorothiazide (ZESTORETIC) 20-12.5 MG tablet TAKE 1 TABLET BY MOUTH EVERY DAY   LORazepam (ATIVAN) 1 MG tablet TAKE 1/2 TABLET TO 1 TABLET UP TO TWICE A DAY AS NEEDED SPARINGLY FOR ANXIETY OR SEVERE INSOMNIA   metoprolol succinate (TOPROL-XL) 25 MG 24 hr tablet Take 1 tablet (25 mg total) by mouth daily.   Microlet Lancets MISC 1 each by Does not apply route daily. E11.9   pantoprazole (PROTONIX) 40 MG tablet TAKE 1 TABLET BY MOUTH EVERY DAY   rosuvastatin (CRESTOR) 40 MG tablet TAKE 1 TABLET BY MOUTH EVERY DAY   tiZANidine (ZANAFLEX) 4 MG tablet SMARTSIG:3 Tablet(s) By Mouth Every Other Day PRN   traZODone (DESYREL) 50 MG tablet TAKE 1/2 UP TO 2 TABLETS BY MOUTH AT BEDTIME AS NEEDED FOR SLEEP   [DISCONTINUED] furosemide (LASIX) 20 MG tablet TAKE 1 TABLET BY MOUTH DAILY IN MORNING AS NEEDED WITH POTASSIUM FOR LOWER EXTREMITY EDEMA    Allergies  Allergen Reactions   Contrast Media [Iodinated Diagnostic Agents] Anaphylaxis   Ambien [Zolpidem Tartrate] Other (See  Comments)    Family has told her that after taking Ambien, she does "crazy things" that she does not remember doing.    Codeine Nausea And Vomiting    SOCIAL HISTORY/FAMILY HISTORY   Reviewed in Epic:  Pertinent findings:  Social History   Tobacco Use   Smoking status: Never   Smokeless tobacco: Never  Substance Use Topics   Alcohol use: No   Drug use: No   Social History   Social History Narrative   Married.    Lives with husband and youngest son, who is 51 y/o.   Has 4 children.   Does not work outside of home.    No formal exercise. Only activity around the house.   Tries to be careful with Diabetic diet.    OBJCTIVE -PE, EKG, labs   Wt Readings from Last 3 Encounters:  02/05/21 221 lb 9.6 oz (100.5 kg)  01/21/21 222 lb 12.8 oz (101.1 kg)  12/31/20 223 lb 6.4 oz (101.3 kg)    Physical Exam: BP (!) 170/80   Pulse 67   Ht 5\' 2"  (1.575 m)   Wt 222 lb 12.8 oz (101.1 kg)   SpO2 100%   BMI 40.75 kg/m  Physical Exam Constitutional:      General: She is not in acute distress.    Appearance: Normal appearance. She is obese. She is not toxic-appearing.  HENT:     Head: Normocephalic and atraumatic.  Neck:     Vascular: No carotid bruit, hepatojugular reflux or JVD.  Cardiovascular:     Rate and Rhythm: Normal rate and regular rhythm.     Chest Wall: PMI is not displaced.     Pulses: Normal pulses.     Heart sounds: Murmur (1/6 SEM @ RUSB) heard.    No friction rub. No gallop.  Musculoskeletal:        General: No swelling. Normal range of motion.     Cervical back: Normal range of motion.  Skin:    General: Skin is warm and dry.  Neurological:     General: No focal deficit present.     Mental Status: She is alert and oriented to person, place, and time.     Gait: Gait normal.  Psychiatric:        Mood and Affect: Mood normal.        Behavior: Behavior normal.  Thought Content: Thought content normal.        Judgment: Judgment normal.    Adult  ECG Report  Rate: 67 ;  Rhythm: normal sinus rhythm and normal axis, intervals & durations ;   Narrative Interpretation: normal  Recent Labs:  n/a  Lab Results  Component Value Date   CHOL 181 05/01/2020   HDL 60 05/01/2020   LDLCALC 89 05/01/2020   TRIG 229 (H) 05/01/2020   CHOLHDL 3.0 05/01/2020   Lab Results  Component Value Date   CREATININE 0.75 05/01/2020   BUN 12 05/01/2020   NA 140 05/01/2020   K 4.3 05/01/2020   CL 102 05/01/2020   CO2 32 05/01/2020   CBC Latest Ref Rng & Units 05/01/2020 11/09/2019 06/28/2019  WBC 3.8 - 10.8 Thousand/uL 5.9 5.6 5.6  Hemoglobin 11.7 - 15.5 g/dL 94.8 54.6 27.0  Hematocrit 35.0 - 45.0 % 45.2(H) 38.0 44.2  Platelets 140 - 400 Thousand/uL 218 190 184    Lab Results  Component Value Date   HGBA1C 6.8 (A) 12/31/2020   Lab Results  Component Value Date   TSH 1.48 05/01/2020    ==================================================  COVID-19 Education: The signs and symptoms of COVID-19 were discussed with the patient and how to seek care for testing (follow up with PCP or arrange E-visit).    I spent a total of 21 minutes with the patient spent in direct patient consultation.  Additional time spent with chart review  / charting (studies, outside notes, etc): 15 min Total Time: 36 min  Current medicines are reviewed at length with the patient today.  (+/- concerns) n/a  This visit occurred during the SARS-CoV-2 public health emergency.  Safety protocols were in place, including screening questions prior to the visit, additional usage of staff PPE, and extensive cleaning of exam room while observing appropriate contact time as indicated for disinfecting solutions.  Notice: This dictation was prepared with Dragon dictation along with smart phrase technology. Any transcriptional errors that result from this process are unintentional and may not be corrected upon review.  Patient Instructions / Medication Changes & Studies & Tests Ordered    Patient Instructions  Medication Instructions:  No changes  Except if your blood pressure is elevated like it is today , recheck in one hour  if still elevated take an additional Toprol Xl 25  mg    *If you need a refill on your cardiac medications before your next appointment, please call your pharmacy*   Lab Work: Not needed   Testing/Procedures: Not needed   Follow-Up: At [facility], you and your health needs are our priority.  As part of our continuing mission to provide you with exceptional heart care, we have created designated Provider Care Teams.  These Care Teams include your primary Cardiologist (physician) and Advanced Practice Providers (APPs -  Physician Assistants and Nurse Practitioners) who all work together to provide you with the care you need, when you need it.  We recommend signing up for the patient portal called "MyChart".  Sign up information is provided on this After Visit Summary.  MyChart is used to connect with patients for Virtual Visits (Telemedicine).  Patients are able to view lab/test results, encounter notes, upcoming appointments, etc.  Non-urgent messages can be sent to your provider as well.   To learn more about what you can do with MyChart, go to [email or web address].    Your next appointment:   12 month(s)  The format for your next appointment:  In Person  Provider:   Bryan Lemma, MD     Studies Ordered:   Orders Placed This Encounter  Procedures   EKG 12-Lead     Bryan Lemma, M.D., M.S. Interventional Cardiologist   Pager # [phone] Phone # [phone] [address] [place], [place] [address]   Thank you for choosing Heartcare at [facility]!!

## 2021-02-05 ENCOUNTER — Encounter: Payer: Self-pay | Admitting: Family Medicine

## 2021-02-05 ENCOUNTER — Other Ambulatory Visit: Payer: Self-pay

## 2021-02-05 ENCOUNTER — Ambulatory Visit: Payer: 59 | Admitting: Family Medicine

## 2021-02-05 VITALS — BP 122/70 | HR 65 | Temp 97.6°F | Resp 16 | Ht 62.0 in | Wt 221.6 lb

## 2021-02-05 DIAGNOSIS — F32A Depression, unspecified: Secondary | ICD-10-CM

## 2021-02-05 DIAGNOSIS — Z23 Encounter for immunization: Secondary | ICD-10-CM

## 2021-02-05 DIAGNOSIS — Z6841 Body Mass Index (BMI) 40.0 and over, adult: Secondary | ICD-10-CM

## 2021-02-05 DIAGNOSIS — F419 Anxiety disorder, unspecified: Secondary | ICD-10-CM

## 2021-02-05 DIAGNOSIS — E782 Mixed hyperlipidemia: Secondary | ICD-10-CM

## 2021-02-05 DIAGNOSIS — E119 Type 2 diabetes mellitus without complications: Secondary | ICD-10-CM

## 2021-02-05 DIAGNOSIS — Z1231 Encounter for screening mammogram for malignant neoplasm of breast: Secondary | ICD-10-CM

## 2021-02-05 DIAGNOSIS — Z5181 Encounter for therapeutic drug level monitoring: Secondary | ICD-10-CM

## 2021-02-05 DIAGNOSIS — K219 Gastro-esophageal reflux disease without esophagitis: Secondary | ICD-10-CM | POA: Diagnosis not present

## 2021-02-05 DIAGNOSIS — Z794 Long term (current) use of insulin: Secondary | ICD-10-CM

## 2021-02-05 DIAGNOSIS — I10 Essential (primary) hypertension: Secondary | ICD-10-CM

## 2021-02-05 DIAGNOSIS — G4700 Insomnia, unspecified: Secondary | ICD-10-CM

## 2021-02-05 DIAGNOSIS — E1165 Type 2 diabetes mellitus with hyperglycemia: Secondary | ICD-10-CM | POA: Diagnosis not present

## 2021-02-05 NOTE — Progress Notes (Signed)
Name: Alicia Pope   MRN: [id number]    DOB: 02-Apr-1961   Date:02/05/2021       Progress Note  Chief Complaint  Patient presents with   Follow-up   Hypertension        Insomnia   Diabetes   Anxiety     Subjective:   Alicia Pope is a 60 y.o. female, presents to clinic for routine f/up  Hypertension:  Currently managed on metoprolol lisinopril HCTZ  Pt reports good med compliance and denies any SE.   Blood pressure today is  controlled. BP Readings from Last 3 Encounters:  02/05/21 122/70  01/21/21 (!) 170/80  01/05/21 (!) 151/93   Pt denies CP, SOB, exertional sx, LE edema, palpitation, Ha's, visual disturbances, lightheadedness, hypotension, syncope. Dietary efforts for BP?    On fluoxitine - feels good Depression screen [facility] 2/9 02/05/2021 08/04/2020 05/01/2020  Decreased Interest 1 0 0  Down, Depressed, Hopeless 0 0 2  PHQ - 2 Score 1 0 2  Altered sleeping 3 0 3  Tired, decreased energy 1 0 1  Change in appetite 0 0 1  Feeling bad or failure about yourself  0 0 1  Trouble concentrating 0 0 1  Moving slowly or fidgety/restless 0 0 0  Suicidal thoughts 0 0 0  PHQ-9 Score 5 0 9  Difficult doing work/chores Not difficult at all Not difficult at all Somewhat difficult  Some recent data might be hidden   GAD 7 : Generalized Anxiety Score 02/05/2021 08/04/2020 05/01/2020 11/09/2019  Nervous, Anxious, on Edge 0  Control/stop worrying 0 1 2 0  Worry too much - different things 0 1 2 0  Trouble relaxing 0 1 2 0  Restless 0 1 2 0  Easily annoyed or irritable 0  Afraid - awful might happen 0 1 2 0  Total GAD 7 Score 0  Anxiety Difficulty Not difficult at all Not difficult at all Not difficult at all Not difficult at all  Phq and gad 7 reviewed She went on vacation - she was taking care of 2 older family members, that was stressful - one passed away and the other went into an assisted living facility - not she is taking care of several grandkids -  just new kind of stress  GERD - dependent on protonix daily, heart burn severe if she doesn't take it daily No prior GI consult   Compliant with endo, has been decreasing insulin, no hypoglycemic episodes, on GLP1 loosing weight     Current Outpatient Medications:    aspirin 81 MG tablet, Take 1 tablet (81 mg total) by mouth daily., Disp: 30 tablet, Rfl: 11   B-D ULTRAFINE III SHORT PEN 31G X 8 MM MISC, USE WITH INSULIN 4 TIMES A DAY, Disp: 200 each, Rfl: 1   [facility] D3 2000 units CAPS, Take 2 capsules (4,000 Units total) by mouth daily., Disp: 30 each, Rfl: 3   Dulaglutide (TRULICITY) 4.5 MG/0.5ML SOPN, Inject 4.5 mg as directed once a week., Disp: 6 mL, Rfl: 3   FLUoxetine (PROZAC) 20 MG tablet, TAKE 1 TABLET BY MOUTH EVERY DAY, Disp: 90 tablet, Rfl: 2   glucose blood (CONTOUR NEXT TEST) test strip, 1 each by Other route daily. E11.9, Disp: 100 each, Rfl: 0   insulin glargine (LANTUS SOLOSTAR) 100 UNIT/ML Solostar Pen, Inject 62 Units into the skin every morning., Disp: 75 mL, Rfl: 3   lisinopril-hydrochlorothiazide (ZESTORETIC) 20-12.5  MG tablet, TAKE 1 TABLET BY MOUTH EVERY DAY, Disp: 90 tablet, Rfl: 1   LORazepam (ATIVAN) 1 MG tablet, TAKE 1/2 TABLET TO 1 TABLET UP TO TWICE A DAY AS NEEDED SPARINGLY FOR ANXIETY OR SEVERE INSOMNIA, Disp: 45 tablet, Rfl: 0   metoprolol succinate (TOPROL-XL) 25 MG 24 hr tablet, Take 1 tablet (25 mg total) by mouth daily., Disp: 90 tablet, Rfl: 2   Microlet Lancets MISC, 1 each by Does not apply route daily. E11.9, Disp: , Rfl:    pantoprazole (PROTONIX) 40 MG tablet, TAKE 1 TABLET BY MOUTH EVERY DAY, Disp: 90 tablet, Rfl: 3   rosuvastatin (CRESTOR) 40 MG tablet, TAKE 1 TABLET BY MOUTH EVERY DAY, Disp: 90 tablet, Rfl: 3   tiZANidine (ZANAFLEX) 4 MG tablet, SMARTSIG:3 Tablet(s) By Mouth Every Other Day PRN, Disp: , Rfl:    traZODone (DESYREL) 50 MG tablet, TAKE 1/2 UP TO 2 TABLETS BY MOUTH AT BEDTIME AS NEEDED FOR SLEEP, Disp: 180 tablet, Rfl: 2   TRULICITY 3  MG/0.5ML SOPN, SMARTSIG:3 Milligram(s) SUB-Q Once a Week, Disp: , Rfl:   Patient Active Problem List   Diagnosis Date Noted   Chronic low back pain 08/04/2020   High risk medication use 05/01/2020   Aortic valve sclerosis 05/01/2020   PAC (premature atrial contraction) 04/24/2019   Sinus bradycardia, persistent 04/24/2019   Exertional dyspnea 04/24/2019   Systolic ejection murmur 02/20/2019   Diabetes mellitus, type II, insulin dependent (HCC) 04/29/2016   Insomnia 04/02/2015   Class 3 severe obesity with serious comorbidity and body mass index (BMI) of 40.0 to 44.9 in adult (HCC) 09/23/2014   Vitamin D deficiency 09/23/2014   Screening for colorectal cancer 09/23/2014   Anxiety    Depression    GERD (gastroesophageal reflux disease)    Essential hypertension    Hyperlipidemia     Past Surgical History:  Procedure Laterality Date   ANTERIOR CRUCIATE LIGAMENT REPAIR  1997   APPENDECTOMY  1987   CARDIAC EVENT MONITOR  03/2019   No average rate 60 bpm.  Max HR 122, minimum HR 36 bpm-sinus bradycardia with PACs and bigeminy.  Frequent PACs seen as isolated, couplets, triplets as well as frequent bigeminy.:   CESAREAN SECTION     [phone]   CHOLECYSTECTOMY  1987   NM MYOVIEW LTD  05/2019   Normal EF 60%.  Poor control hypertension.  (194/101 mmHg).  Small size moderate severity fixed defect in the mid anterior and apical anterior wall likely representing breast attenuation artifact.  No evidence of ischemia (reversibility).  LOW RISK   TRANSTHORACIC ECHOCARDIOGRAM  03/2019   EF 55-60 % with normal wall motion.  Slight LA dilation with grade 1 diastolic function.  Moderate aortic valve calcification-sclerosis without stenosis.  Otherwise normal.   uterine ablation      Family History  Problem Relation Age of Onset   Cancer Father        Lymphoma, Leukemia   Depression Father    Cancer Maternal Grandfather    Cancer Paternal Grandmother    Diabetes Paternal Grandfather     Kidney disease Paternal Grandfather     Social History   Tobacco Use   Smoking status: Never   Smokeless tobacco: Never  Substance Use Topics   Alcohol use: No   Drug use: No     Allergies  Allergen Reactions   Contrast Media [Iodinated Diagnostic Agents] Anaphylaxis   Ambien [Zolpidem Tartrate] Other (See Comments)    Family has told her that after  taking Ambien, she does "crazy things" that she does not remember doing.    Codeine Nausea And Vomiting    Health Maintenance  Topic Date Due   Zoster Vaccines- Shingrix (1 of 2) Never done   PAP SMEAR-Modifier  11/21/2016   Pneumococcal Vaccine 50-18 Years old (3 - PCV) 12/24/2018   MAMMOGRAM  07/25/2020   OPHTHALMOLOGY EXAM  02/05/2021 (Originally 10/04/2019)   COLONOSCOPY (Pts 45-68yrs Insurance coverage will need to be confirmed)  08/04/2021 (Originally 06/07/2005)   HEMOGLOBIN A1C  06/30/2021   FOOT EXAM  12/31/2021   TETANUS/TDAP  11/06/2023   INFLUENZA VACCINE  Completed   Hepatitis C Screening  Completed   HIV Screening  Completed   HPV VACCINES  Aged Out   COVID-19 Vaccine  Discontinued    Chart Review Today: I personally reviewed active problem list, medication list, allergies, family history, social history, health maintenance, notes from last encounter, lab results, imaging with the patient/caregiver today.   Review of Systems  Constitutional: Negative.   HENT: Negative.    Eyes: Negative.   Respiratory: Negative.    Cardiovascular: Negative.   Gastrointestinal: Negative.   Endocrine: Negative.   Genitourinary: Negative.   Musculoskeletal: Negative.   Skin: Negative.   Allergic/Immunologic: Negative.   Neurological: Negative.   Hematological: Negative.   Psychiatric/Behavioral: Negative.    All other systems reviewed and are negative.   Objective:   Vitals:   02/05/21 0901  BP: 122/70  Pulse: 65  Resp: 16  Temp: 97.6 F (36.4 C)  TempSrc: Oral  SpO2: 98%  Weight: 221 lb 9.6 oz (100.5 kg)   Height: 5\' 2"  (1.575 m)    Body mass index is 40.53 kg/m.  Physical Exam Vitals and nursing note reviewed.  Constitutional:      General: She is not in acute distress.    Appearance: Normal appearance. She is well-developed. She is obese. She is not ill-appearing, toxic-appearing or diaphoretic.     Interventions: Face mask in place.  HENT:     Head: Normocephalic and atraumatic.     Right Ear: External ear normal.     Left Ear: External ear normal.  Eyes:     General: Lids are normal. No scleral icterus.       Right eye: No discharge.        Left eye: No discharge.     Conjunctiva/sclera: Conjunctivae normal.  Neck:     Trachea: Phonation normal. No tracheal deviation.  Cardiovascular:     Rate and Rhythm: Normal rate and regular rhythm.     Pulses: Normal pulses.          Radial pulses are 2+ on the right side and 2+ on the left side.       Posterior tibial pulses are 2+ on the right side and 2+ on the left side.     Heart sounds: Normal heart sounds. No murmur heard.   No friction rub. No gallop.  Pulmonary:     Effort: Pulmonary effort is normal. No respiratory distress.     Breath sounds: Normal breath sounds. No stridor. No wheezing, rhonchi or rales.  Chest:     Chest wall: No tenderness.  Abdominal:     General: Bowel sounds are normal. There is no distension.     Palpations: Abdomen is soft.  Musculoskeletal:     Right lower leg: No edema.     Left lower leg: No edema.  Skin:    General: Skin is warm and  dry.     Coloration: Skin is not jaundiced or pale.     Findings: No rash.  Neurological:     Mental Status: She is alert. Mental status is at baseline.     Motor: No abnormal muscle tone.     Gait: Gait normal.  Psychiatric:        Mood and Affect: Mood normal.        Speech: Speech normal.        Behavior: Behavior normal.        Assessment & Plan:   1. Type 2 diabetes mellitus with hyperglycemia, without long-term current use of insulin  (HCC) Established with endocrinology Recent labs and office visit reviewed A1c at goal She has done well with GLP-1, is losing weight and decreasing insulin dose  2. Diabetes mellitus, type II, insulin dependent (HCC) See above  3. Essential hypertension Well controlled, compliant with meds, no SE or concerning sx I reviewed her most recent renal function and electrolytes labs Pt encouraged to continue to work on healthy diet (low salt) and lifestyle for improving HTN management - DASH diet handout offered today   4. Gastroesophageal reflux disease, unspecified whether esophagitis present On daily PPI -reviewed risk of long-term use and encouraged her to consult with GI  5. Mixed hyperlipidemia On statin-last lipids reviewed, previously LDL at goal, good compliance, no myalgias side effects or concerns, will have her return for her CPE and we will do repeat lipid panel at that time Lab Results  Component Value Date   CHOL 181 05/01/2020   HDL 60 05/01/2020   LDLCALC 89 05/01/2020   TRIG 229 (H) 05/01/2020   CHOLHDL 3.0 05/01/2020   6. Anxiety Patient appears relaxed today, in a good mood, anxiety control much better she would like to continue to use same Prozac dosing, rarely using benzo, reviewed the controlled substance database with her today and confirm that she has been stretching out refills longer and longer in between visits she should not need a refill for 2+ months  7. Depression, unspecified depression type PHQ-9 and GAD-7 reviewed, symptoms fairly well controlled continue Prozac  8. Insomnia, unspecified type Insomnia a little better, continue trazodone as needed  9. Class 3 severe obesity with serious comorbidity and body mass index (BMI) of 40.0 to 44.9 in adult, unspecified obesity type [facility]) Patient has lost approximately 30-35 pounds in the past 1 to 2 years Continue GLP-1, working with endocrinology, encouraged increase activity as tolerated Wt Readings from  Last 15 Encounters:  02/05/21 221 lb 9.6 oz (100.5 kg)  01/21/21 222 lb 12.8 oz (101.1 kg)  12/31/20 223 lb 6.4 oz (101.3 kg)  09/25/20 222 lb 3.2 oz (100.8 kg)  08/04/20 226 lb 4.8 oz (102.6 kg)  06/06/20 235 lb 6.4 oz (106.8 kg)  05/01/20 237 lb 4.8 oz (107.6 kg)  02/14/20 241 lb (109.3 kg)  11/27/19 240 lb 6.4 oz (109 kg)  11/09/19 255 lb 9.6 oz (115.9 kg)  10/29/19 (!) 244 lb (110.7 kg)  09/24/19 244 lb 3.2 oz (110.8 kg)  07/20/19 243 lb (110.2 kg)  07/16/19 240 lb (108.9 kg)  06/28/19 250 lb 14.4 oz (113.8 kg)   BMI Readings from Last 5 Encounters:  02/05/21 40.53 kg/m  01/21/21 40.75 kg/m  12/31/20 39.57 kg/m  09/25/20 39.36 kg/m  08/04/20 40.09 kg/m     10. Encounter for medication monitoring Reviewed her most recent labs, will defer needed labs to CPE which I have asked her to  schedule in the next 2 months  11. Anxiety See above  12. Need for influenza vaccination Given today tolerated well - Flu Vaccine QUAD 6+ mos PF IM (Fluarix Quad PF)  13. Encounter for screening mammogram for malignant neoplasm of breast Mammogram ordered at [facility] imaging breast center in [place] encourage patient to call and complete - MM 3D SCREEN BREAST BILATERAL; Future  Patient is also due for diabetic eye exam and Pap smear  Return for JAn to Feb CPE with labs and PAP .   Danelle Berry, PA-C 02/05/21 9:18 AM

## 2021-02-06 ENCOUNTER — Encounter: Payer: Self-pay | Admitting: Family Medicine

## 2021-02-06 MED ORDER — LORAZEPAM 1 MG PO TABS: ORAL_TABLET | ORAL | 0 refills | Status: DC

## 2021-02-07 ENCOUNTER — Other Ambulatory Visit: Payer: Self-pay | Admitting: Family Medicine

## 2021-02-07 DIAGNOSIS — R600 Localized edema: Secondary | ICD-10-CM

## 2021-02-07 DIAGNOSIS — R0602 Shortness of breath: Secondary | ICD-10-CM

## 2021-02-07 NOTE — Telephone Encounter (Signed)
dc'd 01/21/21 Rorie,Brandie CMA Error-phoned [facility] and med taken off pro

## 2021-02-19 ENCOUNTER — Other Ambulatory Visit: Payer: Self-pay | Admitting: Family Medicine

## 2021-02-19 DIAGNOSIS — F410 Panic disorder [episodic paroxysmal anxiety] without agoraphobia: Secondary | ICD-10-CM

## 2021-02-19 DIAGNOSIS — F411 Generalized anxiety disorder: Secondary | ICD-10-CM

## 2021-02-21 ENCOUNTER — Encounter: Payer: Self-pay | Admitting: Cardiology

## 2021-02-21 NOTE — Assessment & Plan Note (Signed)
Blood pressures are very high today.  Likely sure what the issue is.  She is on a high dose of lisinopril-HCTZ as well as metoprolol.  This is a unusual thing for her I think she said that she was running late getting here and was very stressed out.  I have asked that she monitor her blood pressures at home.  If they do get elevated, she can double up dose of Toprol if necessary.  Otherwise we may need to consider additional medications.

## 2021-02-21 NOTE — Assessment & Plan Note (Signed)
The patient understands the need to lose weight with diet and exercise. We have discussed specific strategies for this.  

## 2021-02-21 NOTE — Assessment & Plan Note (Signed)
Overall, pretty well controlled.  She does have some resting bradycardia, was a bit reluctant to push up too high.  Certainly she has room if she needs to take an additional dose for elevated blood pressures as they are elevated today.  She can also take additional doses if she is having a bad spell.

## 2021-02-21 NOTE — Assessment & Plan Note (Signed)
On high-dose rosuvastatin.  LDL 89.  Stable.  Due for recheck in January.  Continue with diet and exercise with plans for weight loss.  A1c 6.8.  As a result, triglycerides are elevated.  With better glycemic control, this will improve as well.

## 2021-03-02 ENCOUNTER — Other Ambulatory Visit: Payer: Self-pay | Admitting: Family Medicine

## 2021-03-02 DIAGNOSIS — K219 Gastro-esophageal reflux disease without esophagitis: Secondary | ICD-10-CM

## 2021-03-21 ENCOUNTER — Other Ambulatory Visit: Payer: Self-pay | Admitting: Family Medicine

## 2021-03-21 DIAGNOSIS — K219 Gastro-esophageal reflux disease without esophagitis: Secondary | ICD-10-CM

## 2021-03-22 NOTE — Telephone Encounter (Signed)
Requested Prescriptions  Pending Prescriptions Disp Refills   pantoprazole (PROTONIX) 40 MG tablet [Pharmacy Med Name: PANTOPRAZOLE SOD DR 40 MG TAB] 90 tablet 3    Sig: TAKE 1 TABLET BY MOUTH EVERY DAY     Gastroenterology: Proton Pump Inhibitors Passed - 03/21/2021  7:20 PM      Passed - Valid encounter within last 12 months    Recent Outpatient Visits          1 month ago Type 2 diabetes mellitus with hyperglycemia, without long-term current use of insulin [facility])   [facility] [place], Sheliah Mends, PA-C   7 months ago Type 2 diabetes mellitus with hyperglycemia, without long-term current use of insulin [facility])   [facility] Danelle Berry, PA-C   10 months ago Essential hypertension   [facility] Danelle Berry, PA-C   1 year ago Lower extremity edema   [facility] Danelle Berry, PA-C   1 year ago Type 2 diabetes mellitus without complication, with long-term current use of insulin [facility])   [facility] Danelle Berry, PA-C      Future Appointments            In 1 month Danelle Berry, PA-C [facility]

## 2021-04-08 ENCOUNTER — Inpatient Hospital Stay: Admission: RE | Admit: 2021-04-08 | Payer: 59 | Source: Ambulatory Visit

## 2021-04-10 ENCOUNTER — Ambulatory Visit: Payer: 59 | Admitting: Endocrinology

## 2021-04-28 ENCOUNTER — Encounter: Payer: 59 | Admitting: Family Medicine

## 2021-04-29 ENCOUNTER — Ambulatory Visit
Admission: RE | Admit: 2021-04-29 | Discharge: 2021-04-29 | Disposition: A | Discharge status: Home / Self Care | Payer: 59 | Source: Ambulatory Visit | Attending: Family Medicine | Admitting: Family Medicine

## 2021-04-29 DIAGNOSIS — Z1231 Encounter for screening mammogram for malignant neoplasm of breast: Secondary | ICD-10-CM

## 2021-05-02 ENCOUNTER — Other Ambulatory Visit: Payer: Self-pay | Admitting: Family Medicine

## 2021-05-02 ENCOUNTER — Other Ambulatory Visit: Payer: Self-pay | Admitting: Endocrinology

## 2021-05-02 DIAGNOSIS — E1165 Type 2 diabetes mellitus with hyperglycemia: Secondary | ICD-10-CM

## 2021-05-02 DIAGNOSIS — I10 Essential (primary) hypertension: Secondary | ICD-10-CM

## 2021-05-02 NOTE — Telephone Encounter (Signed)
Requested medications are due for refill today.  yes  Requested medications are on the active medications list.  yes  Last refill. 11/19/2020  Future visit scheduled.   no  Notes to clinic.  Failed protocol d/t expired labs.    Requested Prescriptions  Pending Prescriptions Disp Refills   lisinopril-hydrochlorothiazide (ZESTORETIC) 20-12.5 MG tablet [Pharmacy Med Name: LISINOPRIL-HCTZ 20-12.5 MG TAB] 90 tablet 1    Sig: TAKE 1 TABLET BY MOUTH EVERY DAY     Cardiovascular:  ACEI + Diuretic Combos Failed - 05/02/2021  1:11 AM      Failed - Na in normal range and within 180 days    Sodium  Date Value Ref Range Status  05/01/2020 140 135 - 146 mmol/L Final          Failed - K in normal range and within 180 days    Potassium  Date Value Ref Range Status  05/01/2020 4.3 3.5 - 5.3 mmol/L Final          Failed - Cr in normal range and within 180 days    Creat  Date Value Ref Range Status  05/01/2020 0.75 0.50 - 1.05 mg/dL Final    Comment:    For patients >52 years of age, the reference limit for Creatinine is approximately 13% higher for people identified as African-American. .           Failed - Ca in normal range and within 180 days    Calcium  Date Value Ref Range Status  05/01/2020 9.6 8.6 - 10.4 mg/dL Final   Calcium, Ion  Date Value Ref Range Status  07/31/2011 1.24 1.12 - 1.32 mmol/L Final          Passed - Patient is not pregnant      Passed - Last BP in normal range    BP Readings from Last 1 Encounters:  02/05/21 122/70          Passed - Valid encounter within last 6 months    Recent Outpatient Visits           2 months ago Type 2 diabetes mellitus with hyperglycemia, without long-term current use of insulin [facility])   [facility], Sheliah Mends, PA-C   9 months ago Type 2 diabetes mellitus with hyperglycemia, without long-term current use of insulin [facility])   [facility] Danelle Berry, PA-C   1 year ago  Essential hypertension   [facility] Danelle Berry, PA-C   1 year ago Lower extremity edema   [facility] Danelle Berry, PA-C   1 year ago Type 2 diabetes mellitus without complication, with long-term current use of insulin [facility])   [facility] Danelle Berry, [place]

## 2021-05-05 ENCOUNTER — Other Ambulatory Visit: Payer: Self-pay

## 2021-05-05 ENCOUNTER — Ambulatory Visit (INDEPENDENT_AMBULATORY_CARE_PROVIDER_SITE_OTHER): Payer: 59 | Admitting: Endocrinology

## 2021-05-05 VITALS — BP 108/70 | HR 67 | Ht 62.0 in | Wt 218.6 lb

## 2021-05-05 DIAGNOSIS — E1165 Type 2 diabetes mellitus with hyperglycemia: Secondary | ICD-10-CM | POA: Diagnosis not present

## 2021-05-05 LAB — POCT GLYCOSYLATED HEMOGLOBIN (HGB A1C): Hemoglobin A1C: 8.2 % — AB (ref 4.0–5.6)

## 2021-05-05 MED ORDER — LANTUS SOLOSTAR 100 UNIT/ML ~~LOC~~ SOPN: 62.0000 [IU] | PEN_INJECTOR | SUBCUTANEOUS | 3 refills | Status: DC

## 2021-05-05 NOTE — Patient Instructions (Addendum)
check your blood sugar twice a day.  vary the time of day when you check, between before the 3 meals, and at bedtime.  also check if you have symptoms of your blood sugar being too high or too low.  please keep a record of the readings and bring it to your next appointment here (or you can bring the meter itself).  You can write it on any piece of paper.  please call us sooner if your blood sugar goes below 70, or if you have a lot of readings over 200. On this type of insulin schedule, you should eat meals on a regular schedule.  If a meal is missed or significantly delayed, your blood sugar could go low.    Please continue the same Trulicity and insulin.   Please call if you have trouble getting Trulicity, so we can change it to a similar medication.   Please come back for a follow-up appointment in 2-3 months.

## 2021-05-05 NOTE — Progress Notes (Signed)
Subjective:    [name], female    DOB: Apr 05, 1961, 61 y.o.   MRN: [id number]  HPI Pt returns for f/u of diabetes mellitus:  DM type: Insulin-requiring type 2.  Dx'ed: 2004 Complications: none Therapy: insulin since 2018, and Trulicity.   GDM: 1988 DKA: never Severe hypoglycemia: never Pancreatitis: never Pancreatic imaging: normal on 2013 CT Other: she did not tolerate Invokana (vaginitis); she declined to continue multiple daily injections.   Interval history: Pt says she takes insulin as rx'ed.  no cbg record, but states cbg's vary from 80-220. She was without Trulicity x 1 month (due to supply probs), and she just resumed 1 week ago.   Past Medical History:  Diagnosis Date   Allergy    Anxiety 2013   Asthma 2015   Asthma    Depression 2013   Diabetes mellitus 2004   Finding of multiple premature atrial contractions by electrocardiography    GERD (gastroesophageal reflux disease) 2013   Hyperlipidemia 1992   Hypertension 1992   Memory impairment     Past Surgical History:  Procedure Laterality Date   ANTERIOR CRUCIATE LIGAMENT REPAIR  1997   APPENDECTOMY  1987   CARDIAC EVENT MONITOR  03/2019   No average rate 60 bpm.  Max HR 122, minimum HR 36 bpm-sinus bradycardia with PACs and bigeminy.  Frequent PACs seen as isolated, couplets, triplets as well as frequent bigeminy.:   CESAREAN SECTION     [phone]   CHOLECYSTECTOMY  1987   NM MYOVIEW LTD  05/2019   Normal EF 60%.  Poor control hypertension.  (194/101 mmHg).  Small size moderate severity fixed defect in the mid anterior and apical anterior wall likely representing breast attenuation artifact.  No evidence of ischemia (reversibility).  LOW RISK   TRANSTHORACIC ECHOCARDIOGRAM  03/2019   EF 55-60 % with normal wall motion.  Slight LA dilation with grade 1 diastolic function.  Moderate aortic valve calcification-sclerosis without stenosis.  Otherwise normal.   uterine ablation      Social  History   Socioeconomic History   Marital status: Married    Spouse name: Not on file   Number of children: Not on file   Years of education: Not on file   Highest education level: Not on file  Occupational History   Not on file  Tobacco Use   Smoking status: Never   Smokeless tobacco: Never  Substance and Sexual Activity   Alcohol use: No   Drug use: No   Sexual activity: Yes  Other Topics Concern   Not on file  Social History Narrative   Married.    Lives with husband and youngest son, who is 33 y/o.   Has 4 children.   Does not work outside of home.    No formal exercise. Only activity around the house.   Tries to be careful with Diabetic diet.   Social Determinants of Health   Financial Resource Strain: Not on file  Food Insecurity: Not on file  Transportation Needs: Not on file  Physical Activity: Not on file  Stress: Not on file  Social Connections: Not on file  Intimate Partner Violence: Not on file    Current Outpatient Medications on File Prior to Visit  Medication Sig Dispense Refill   aspirin 81 MG tablet Take 1 tablet (81 mg total) by mouth daily. 30 tablet 11   B-D ULTRAFINE III SHORT PEN 31G X 8 MM MISC USE WITH INSULIN 4 TIMES A DAY  200 each 1   [employer] D3 2000 units CAPS Take 2 capsules (4,000 Units total) by mouth daily. 30 each 3   FLUoxetine (PROZAC) 20 MG tablet TAKE 1 TABLET BY MOUTH EVERY DAY 90 tablet 2   glucose blood (CONTOUR NEXT TEST) test strip 1 each by Other route daily. E11.9 100 each 0   lisinopril-hydrochlorothiazide (ZESTORETIC) 20-12.5 MG tablet TAKE 1 TABLET BY MOUTH EVERY DAY 90 tablet 1   LORazepam (ATIVAN) 1 MG tablet TAKE 1/2 TABLET TO 1 TABLET UP TO TWICE A DAY AS NEEDED SPARINGLY FOR ANXIETY OR SEVERE INSOMNIA 30 tablet 0   metoprolol succinate (TOPROL-XL) 25 MG 24 hr tablet Take 1 tablet (25 mg total) by mouth daily. 90 tablet 2   Microlet Lancets MISC 1 each by Does not apply route daily. E11.9     pantoprazole (PROTONIX) 40 MG  tablet TAKE 1 TABLET BY MOUTH EVERY DAY 90 tablet 3   rosuvastatin (CRESTOR) 40 MG tablet TAKE 1 TABLET BY MOUTH EVERY DAY 90 tablet 3   tiZANidine (ZANAFLEX) 4 MG tablet SMARTSIG:3 Tablet(s) By Mouth Every Other Day PRN     traZODone (DESYREL) 50 MG tablet TAKE 1/2 UP TO 2 TABLETS BY MOUTH AT BEDTIME AS NEEDED FOR SLEEP 180 tablet 2   TRULICITY 3 MG/0.5ML SOPN SMARTSIG:3 Milligram(s) SUB-Q Once a Week     No current facility-administered medications on file prior to visit.    Allergies  Allergen Reactions   Contrast Media [Iodinated Contrast Media] Anaphylaxis   Ambien [Zolpidem Tartrate] Other (See Comments)    Family has told her that after taking Ambien, she does "crazy things" that she does not remember doing.    Codeine Nausea And Vomiting    Family History  Problem Relation Age of Onset   Cancer Father        Lymphoma, Leukemia   Depression Father    Cancer Maternal Grandfather    Cancer Paternal Grandmother    Diabetes Paternal Grandfather    Kidney disease Paternal Grandfather     BP 108/70    Pulse 67    Ht 5\' 2"  (1.575 m)    Wt 218 lb 9.6 oz (99.2 kg)    SpO2 97%    BMI 39.98 kg/m    Review of Systems She denies hypoglycemia/N/V/HB.        Objective:   Physical Exam    Lab Results  Component Value Date   HGBA1C 8.2 (A) 05/05/2021      Assessment & Plan:  Insulin-requiring type 2 DM: uncontrolled  Patient Instructions  check your blood sugar twice a day.  vary the time of day when you check, between before the 3 meals, and at bedtime.  also check if you have symptoms of your blood sugar being too high or too low.  please keep a record of the readings and bring it to your next appointment here (or you can bring the meter itself).  You can write it on any piece of paper.  please call us sooner if your blood sugar goes below 70, or if you have a lot of readings over 200. On this type of insulin schedule, you should eat meals on a regular schedule.  If a meal  is missed or significantly delayed, your blood sugar could go low.    Please continue the same Trulicity and insulin.   Please call if you have trouble getting Trulicity, so we can change it to a similar medication.   Please come  back for a follow-up appointment in 2-3 months.

## 2021-06-01 ENCOUNTER — Other Ambulatory Visit: Payer: Self-pay | Admitting: Family Medicine

## 2021-06-01 DIAGNOSIS — I10 Essential (primary) hypertension: Secondary | ICD-10-CM

## 2021-06-01 NOTE — Telephone Encounter (Signed)
90 day supply provided - patient will need lab work at future visit for monitoring

## 2021-06-14 ENCOUNTER — Other Ambulatory Visit: Payer: Self-pay | Admitting: Cardiology

## 2021-06-14 DIAGNOSIS — I491 Atrial premature depolarization: Secondary | ICD-10-CM

## 2021-06-14 DIAGNOSIS — R002 Palpitations: Secondary | ICD-10-CM

## 2021-06-30 ENCOUNTER — Other Ambulatory Visit: Payer: Self-pay | Admitting: Endocrinology

## 2021-07-17 ENCOUNTER — Other Ambulatory Visit: Payer: Self-pay | Admitting: Physician Assistant

## 2021-07-17 ENCOUNTER — Other Ambulatory Visit: Payer: Self-pay | Admitting: Family Medicine

## 2021-07-17 DIAGNOSIS — G4700 Insomnia, unspecified: Secondary | ICD-10-CM

## 2021-07-17 DIAGNOSIS — I10 Essential (primary) hypertension: Secondary | ICD-10-CM

## 2021-07-20 ENCOUNTER — Encounter: Payer: 59 | Admitting: Family Medicine

## 2021-07-20 ENCOUNTER — Telehealth: Payer: Self-pay | Admitting: Family Medicine

## 2021-07-20 NOTE — Telephone Encounter (Signed)
FYI

## 2021-07-20 NOTE — Telephone Encounter (Signed)
Requested medication (s) are due for refill today:   No ? ?Requested medication (s) are on the active medication list:   Yes ? ?Future visit scheduled:   Yes today Mon 4/17 at 2:00 with Dr. Ky Barban ? ? ?Last ordered: 06/01/2021 #90, 0 refills  ? ?Returned for Dr. Ky Barban in case of changes during [place] today.   ? ?Requested Prescriptions  ?Pending Prescriptions Disp Refills  ? lisinopril-hydrochlorothiazide (ZESTORETIC) 20-12.5 MG tablet [Pharmacy Med Name: LISINOPRIL-HCTZ 20-12.5 MG TAB] 90 tablet 0  ?  Sig: TAKE 1 TABLET BY MOUTH EVERY DAY  ?  ? Cardiovascular:  ACEI + Diuretic Combos Failed - 07/17/2021  4:17 PM  ?  ?  Failed - Na in normal range and within 180 days  ?  Sodium  ?Date Value Ref Range Status  ?05/01/2020 140 135 - 146 mmol/L Final  ?  ?  ?  ?  Failed - K in normal range and within 180 days  ?  Potassium  ?Date Value Ref Range Status  ?05/01/2020 4.3 3.5 - 5.3 mmol/L Final  ?  ?  ?  ?  Failed - Cr in normal range and within 180 days  ?  Creat  ?Date Value Ref Range Status  ?05/01/2020 0.75 0.50 - 1.05 mg/dL Final  ?  Comment:  ?  For patients >43 years of age, the reference limit ?for Creatinine is approximately 13% higher for people ?identified as African-American. ?. ?  ?  ?  ?  ?  Failed - eGFR is 30 or above and within 180 days  ?  GFR, Est African American  ?Date Value Ref Range Status  ?05/01/2020 101 > OR = 60 mL/min/1.4m2 Final  ? ?GFR, Est Non African American  ?Date Value Ref Range Status  ?05/01/2020 87 > OR = 60 mL/min/1.28m2 Final  ?  ?  ?  ?  Passed - Patient is not pregnant  ?  ?  Passed - Last BP in normal range  ?  BP Readings from Last 1 Encounters:  ?05/05/21 108/70  ?  ?  ?  ?  Passed - Valid encounter within last 6 months  ?  Recent Outpatient Visits   ? ?      ? 5 months ago Type 2 diabetes mellitus with hyperglycemia, without long-term current use of insulin ([place])  ? [facility] Delsa Grana, [place]  ? 11 months ago Type 2 diabetes mellitus with hyperglycemia,  without long-term current use of insulin ([place])  ? [facility] Delsa Grana, [place]-C  ? 1 year ago Essential hypertension  ? [facility] Delsa Grana, PA-C  ? 1 year ago Lower extremity edema  ? [facility] Delsa Grana, PA-C  ? 1 year ago Type 2 diabetes mellitus without complication, with long-term current use of insulin ([place])  ? [facility] Delsa Grana, [place]  ? ?  ?  ?Future Appointments   ? ?        ? Today Rumball, Jake Church, DO [facility], [place]  ? ?  ? ? ?  ?  ?  ? ?

## 2021-07-20 NOTE — Telephone Encounter (Signed)
Pt was scheduled with Dr. Linwood Dibbles today but had to cancel her appointment due to her mother passing away. She will call back to reschedule. Just an FYI ?

## 2021-08-04 ENCOUNTER — Other Ambulatory Visit: Payer: Self-pay | Admitting: Physician Assistant

## 2021-08-04 DIAGNOSIS — I10 Essential (primary) hypertension: Secondary | ICD-10-CM

## 2021-08-04 NOTE — Telephone Encounter (Signed)
Requested medication (s) are due for refill today: yes ? ?Requested medication (s) are on the active medication list: yes ? ?Last refill:  06/01/21 #90/0 ? ?Future visit scheduled: no ? ?Notes to clinic:  Unable to refill per protocol due to failed labs, no updated results.  ? ?  ?Requested Prescriptions  ?Pending Prescriptions Disp Refills  ? lisinopril-hydrochlorothiazide (ZESTORETIC) 20-12.5 MG tablet [Pharmacy Med Name: LISINOPRIL-HCTZ 20-12.5 MG TAB] 90 tablet 0  ?  Sig: TAKE 1 TABLET BY MOUTH EVERY DAY  ?  ? Cardiovascular:  ACEI + Diuretic Combos Failed - 08/04/2021 12:09 PM  ?  ?  Failed - Na in normal range and within 180 days  ?  Sodium  ?Date Value Ref Range Status  ?05/01/2020 140 135 - 146 mmol/L Final  ?  ?  ?  ?  Failed - K in normal range and within 180 days  ?  Potassium  ?Date Value Ref Range Status  ?05/01/2020 4.3 3.5 - 5.3 mmol/L Final  ?  ?  ?  ?  Failed - Cr in normal range and within 180 days  ?  Creat  ?Date Value Ref Range Status  ?05/01/2020 0.75 0.50 - 1.05 mg/dL Final  ?  Comment:  ?  For patients >110 years of age, the reference limit ?for Creatinine is approximately 13% higher for people ?identified as African-American. ?. ?  ?  ?  ?  ?  Failed - eGFR is 30 or above and within 180 days  ?  GFR, Est African American  ?Date Value Ref Range Status  ?05/01/2020 101 > OR = 60 mL/min/1.47m2 Final  ? ?GFR, Est Non African American  ?Date Value Ref Range Status  ?05/01/2020 87 > OR = 60 mL/min/1.65m2 Final  ?  ?  ?  ?  Passed - Patient is not pregnant  ?  ?  Passed - Last BP in normal range  ?  BP Readings from Last 1 Encounters:  ?05/05/21 108/70  ?  ?  ?  ?  Passed - Valid encounter within last 6 months  ?  Recent Outpatient Visits   ? ?      ? 6 months ago Type 2 diabetes mellitus with hyperglycemia, without long-term current use of insulin ([place])  ? [facility] Delsa Grana, PA-C  ? 1 year ago Type 2 diabetes mellitus with hyperglycemia, without long-term current use of  insulin ([place])  ? [facility] Delsa Grana, PA-C  ? 1 year ago Essential hypertension  ? [facility] Delsa Grana, PA-C  ? 1 year ago Lower extremity edema  ? [facility] Delsa Grana, PA-C  ? 1 year ago Type 2 diabetes mellitus without complication, with long-term current use of insulin ([place])  ? [facility] Delsa Grana, [place]  ? ?  ?  ? ? ?  ?  ?  ? ?

## 2021-08-06 ENCOUNTER — Encounter: Payer: Self-pay | Admitting: Endocrinology

## 2021-08-06 ENCOUNTER — Ambulatory Visit: Payer: 59 | Admitting: Endocrinology

## 2021-08-06 VITALS — BP 110/76 | HR 55 | Ht 62.0 in | Wt 218.8 lb

## 2021-08-06 DIAGNOSIS — E1165 Type 2 diabetes mellitus with hyperglycemia: Secondary | ICD-10-CM

## 2021-08-06 DIAGNOSIS — Z794 Long term (current) use of insulin: Secondary | ICD-10-CM | POA: Diagnosis not present

## 2021-08-06 LAB — POCT GLYCOSYLATED HEMOGLOBIN (HGB A1C): Hemoglobin A1C: 6.7 % — AB (ref 4.0–5.6)

## 2021-08-06 MED ORDER — LANTUS SOLOSTAR 100 UNIT/ML ~~LOC~~ SOPN: 55.0000 [IU] | PEN_INJECTOR | SUBCUTANEOUS | 1 refills | Status: DC

## 2021-08-06 MED ORDER — SEMAGLUTIDE (1 MG/DOSE) 4 MG/3ML ~~LOC~~ SOPN: 1.0000 mg | PEN_INJECTOR | SUBCUTANEOUS | 1 refills | Status: DC

## 2021-08-06 NOTE — Progress Notes (Signed)
? ?Subjective:  ? ? [name], female    DOB: October 28, 1960, 61 y.o.   MRN: [id number] ? ?HPI ?Pt returns for f/u of diabetes mellitus:  ?DM type: Insulin-requiring type 2.  ?Dx'ed: 2004 ?Complications: none ?Therapy: insulin since [id number], and Trulicity.   ?GDM: 1988 ?DKA: never ?Severe hypoglycemia: never ?Pancreatitis: never ?Pancreatic imaging: normal on 2013 CT ?Other: she did not tolerate Invokana (vaginitis); she declined to continue multiple daily injections.   ?Interval history: Pt says she takes insulin as rx'ed.  no cbg record, but states cbg's are in the high-100's.  She is still having trouble getting Trulicity.   ?Past Medical History:  ?Diagnosis Date  ? Allergy   ? Anxiety 2013  ? Asthma 2015  ? Asthma   ? Depression 2013  ? Diabetes mellitus 2004  ? Finding of multiple premature atrial contractions by electrocardiography   ? GERD (gastroesophageal reflux disease) 2013  ? Hyperlipidemia 1992  ? Hypertension 1992  ? Memory impairment   ? ? ?Past Surgical History:  ?Procedure Laterality Date  ? ANTERIOR CRUCIATE LIGAMENT REPAIR  1997  ? APPENDECTOMY  1987  ? CARDIAC EVENT MONITOR  03/2019  ? No average rate 60 bpm.  Max HR 122, minimum HR 36 bpm-sinus bradycardia with PACs and bigeminy.  Frequent PACs seen as isolated, couplets, triplets as well as frequent bigeminy.:  ? CESAREAN SECTION    ? [phone]  ? CHOLECYSTECTOMY  1987  ? NM MYOVIEW LTD  05/2019  ? Normal EF 60%.  Poor control hypertension.  (194/101 mmHg).  Small size moderate severity fixed defect in the mid anterior and apical anterior wall likely representing breast attenuation artifact.  No evidence of ischemia (reversibility).  LOW RISK  ? TRANSTHORACIC ECHOCARDIOGRAM  03/2019  ? EF 55-60 % with normal wall motion.  Slight LA dilation with grade 1 diastolic function.  Moderate aortic valve calcification-sclerosis without stenosis.  Otherwise normal.  ? uterine ablation    ? ? ?Social History  ? ?Socioeconomic History  ?  Marital status: Married  ?  Spouse name: Not on file  ? Number of children: Not on file  ? Years of education: Not on file  ? Highest education level: Not on file  ?Occupational History  ? Not on file  ?Tobacco Use  ? Smoking status: Never  ? Smokeless tobacco: Never  ?Substance and Sexual Activity  ? Alcohol use: No  ? Drug use: No  ? Sexual activity: Yes  ?Other Topics Concern  ? Not on file  ?Social History Narrative  ? Married.   ? Lives with husband and youngest son, who is 5 y/o.  ? Has 4 children.  ? Does not work outside of home.   ? No formal exercise. Only activity around the house.  ? Tries to be careful with Diabetic diet.  ? ?Social Determinants of Health  ? ?Financial Resource Strain: Not on file  ?Food Insecurity: Not on file  ?Transportation Needs: Not on file  ?Physical Activity: Not on file  ?Stress: Not on file  ?Social Connections: Not on file  ?Intimate Partner Violence: Not on file  ? ? ?Current Outpatient Medications on File Prior to Visit  ?Medication Sig Dispense Refill  ? aspirin 81 MG tablet Take 1 tablet (81 mg total) by mouth daily. 30 tablet 11  ? B-D ULTRAFINE III SHORT PEN 31G X 8 MM MISC USE WITH INSULIN 4 TIMES A DAY 200 each 1  ? [employer] D3 2000 units  CAPS Take 2 capsules (4,000 Units total) by mouth daily. 30 each 3  ? FLUoxetine (PROZAC) 20 MG tablet TAKE 1 TABLET BY MOUTH EVERY DAY 90 tablet 2  ? glucose blood (CONTOUR NEXT TEST) test strip 1 each by Other route daily. E11.9 100 each 0  ? lisinopril-hydrochlorothiazide (ZESTORETIC) 20-12.5 MG tablet TAKE 1 TABLET BY MOUTH EVERY DAY 30 tablet 0  ? LORazepam (ATIVAN) 1 MG tablet TAKE 1/2 TABLET TO 1 TABLET UP TO TWICE A DAY AS NEEDED SPARINGLY FOR ANXIETY OR SEVERE INSOMNIA 30 tablet 0  ? metoprolol succinate (TOPROL-XL) 25 MG 24 hr tablet TAKE 1 TABLET (25 MG TOTAL) BY MOUTH DAILY. 90 tablet 2  ? Microlet Lancets MISC 1 each by Does not apply route daily. E11.9    ? pantoprazole (PROTONIX) 40 MG tablet TAKE 1 TABLET BY MOUTH EVERY  DAY 90 tablet 3  ? rosuvastatin (CRESTOR) 40 MG tablet TAKE 1 TABLET BY MOUTH EVERY DAY 90 tablet 3  ? tiZANidine (ZANAFLEX) 4 MG tablet SMARTSIG:3 Tablet(s) By Mouth Every Other Day PRN    ? traZODone (DESYREL) 50 MG tablet TAKE 1/2 UP TO 2 TABLETS BY MOUTH AT BEDTIME AS NEEDED FOR SLEEP 180 tablet 2  ? ?No current facility-administered medications on file prior to visit.  ? ? ? ? ?Family History  ?Problem Relation Age of Onset  ? Cancer Father   ?     Lymphoma, Leukemia  ? Depression Father   ? Cancer Maternal Grandfather   ? Cancer Paternal Grandmother   ? Diabetes Paternal Grandfather   ? Kidney disease Paternal Grandfather   ? ? ?BP 110/76 (BP Location: Left Arm, Patient Position: Sitting, Cuff Size: Normal)   Pulse (!) 55   Ht 5\' 2"  (1.575 m)   Wt 218 lb 12.8 oz (99.2 kg)   SpO2 99%   BMI 40.02 kg/m?  ? ? ?Review of Systems ?N/HB have resolved.  ?   ?Objective:  ? Physical Exam ?VITAL SIGNS:  See vs page ?GENERAL: no distress.   ? ?A1c=6.7% ?   ?Assessment & Plan:  ?Insulin-requiring type 2 DM: overcontrolled.   ? ?Patient Instructions  ?check your blood sugar twice a day.  vary the time of day when you check, between before the 3 meals, and at bedtime.  also check if you have symptoms of your blood sugar being too high or too low.  please keep a record of the readings and bring it to your next appointment here (or you can bring the meter itself).  You can write it on any piece of paper.  please call us sooner if your blood sugar goes below 70, or if you have a lot of readings over 200.  ?On this type of insulin schedule, you should eat meals on a regular schedule.  If a meal is missed or significantly delayed, your blood sugar could go low.   ?I have sent a prescription to your pharmacy, to change the Trulicity to [place], and:  ?Please reduce the Lantus to 55 units each morning.  ?You should have an endocrinology follow-up appointment in 3 months.   ? ? ? ?

## 2021-08-06 NOTE — Patient Instructions (Addendum)
check your blood sugar twice a day.  vary the time of day when you check, between before the 3 meals, and at bedtime.  also check if you have symptoms of your blood sugar being too high or too low.  please keep a record of the readings and bring it to your next appointment here (or you can bring the meter itself).  You can write it on any piece of paper.  please call us sooner if your blood sugar goes below 70, or if you have a lot of readings over 200.  ?On this type of insulin schedule, you should eat meals on a regular schedule.  If a meal is missed or significantly delayed, your blood sugar could go low.   ?I have sent a prescription to your pharmacy, to change the Trulicity to Ozempic, and:  ?Please reduce the Lantus to 55 units each morning.  ?You should have an endocrinology follow-up appointment in 3 months.   ? ?

## 2021-10-05 ENCOUNTER — Other Ambulatory Visit: Payer: Self-pay | Admitting: Family Medicine

## 2021-10-05 DIAGNOSIS — I10 Essential (primary) hypertension: Secondary | ICD-10-CM

## 2021-10-05 NOTE — Telephone Encounter (Signed)
Appt scheduled for 7.27.2023 pt informed script has been sent to pharmacy

## 2021-10-05 NOTE — Telephone Encounter (Signed)
Last 30 day given pt needs an appt

## 2021-10-11 ENCOUNTER — Other Ambulatory Visit: Payer: Self-pay | Admitting: Family Medicine

## 2021-10-11 DIAGNOSIS — F411 Generalized anxiety disorder: Secondary | ICD-10-CM

## 2021-10-17 ENCOUNTER — Other Ambulatory Visit: Payer: Self-pay | Admitting: Family Medicine

## 2021-10-17 DIAGNOSIS — E785 Hyperlipidemia, unspecified: Secondary | ICD-10-CM

## 2021-10-29 ENCOUNTER — Ambulatory Visit: Payer: 59 | Admitting: Family Medicine

## 2021-10-30 ENCOUNTER — Telehealth: Payer: Self-pay | Admitting: Family Medicine

## 2021-10-30 DIAGNOSIS — I10 Essential (primary) hypertension: Secondary | ICD-10-CM

## 2021-11-02 NOTE — Telephone Encounter (Signed)
Pt has an appt on 11/05/21

## 2021-11-02 NOTE — Telephone Encounter (Signed)
Requested medications are due for refill today.  yes  Requested medications are on the active medications list.  yes  Last refill. 10/05/2021 #30 0 refills  Future visit scheduled.   yes  Notes to clinic.  Labs are expired.    Requested Prescriptions  Pending Prescriptions Disp Refills   lisinopril-hydrochlorothiazide (ZESTORETIC) 20-12.5 MG tablet [Pharmacy Med Name: LISINOPRIL-HCTZ 20-12.5 MG TAB] 30 tablet 0    Sig: TAKE 1 TABLET BY MOUTH EVERY DAY     Cardiovascular:  ACEI + Diuretic Combos Failed - 10/30/2021  8:05 PM      Failed - Na in normal range and within 180 days    Sodium  Date Value Ref Range Status  05/01/2020 140 135 - 146 mmol/L Final         Failed - K in normal range and within 180 days    Potassium  Date Value Ref Range Status  05/01/2020 4.3 3.5 - 5.3 mmol/L Final         Failed - Cr in normal range and within 180 days    Creat  Date Value Ref Range Status  05/01/2020 0.75 0.50 - 1.05 mg/dL Final    Comment:    For patients >18 years of age, the reference limit for Creatinine is approximately 13% higher for people identified as African-American. .          Failed - eGFR is 30 or above and within 180 days    GFR, Est African American  Date Value Ref Range Status  05/01/2020 101 > OR = 60 mL/min/1.54m2 Final   GFR, Est Non African American  Date Value Ref Range Status  05/01/2020 87 > OR = 60 mL/min/1.59m2 Final         Failed - Valid encounter within last 6 months    Recent Outpatient Visits           9 months ago Type 2 diabetes mellitus with hyperglycemia, without long-term current use of insulin [facility])   [facility] Delsa Grana, PA-C   1 year ago Type 2 diabetes mellitus with hyperglycemia, without long-term current use of insulin [facility])   [facility] Delsa Grana, PA-C   1 year ago Essential hypertension   [facility] Delsa Grana, PA-C   1 year ago Lower extremity edema    [facility] Delsa Grana, PA-C   2 years ago Type 2 diabetes mellitus without complication, with long-term current use of insulin [facility])   [facility] Delsa Grana, PA-C       Future Appointments             In 3 days Reece Packer, Myna Hidalgo, [facility], [place] Patient is not pregnant      Passed - Last BP in normal range    BP Readings from Last 1 Encounters:  08/06/21 110/76

## 2021-11-02 NOTE — Telephone Encounter (Signed)
Pt was given on 10/05/21 a 30 day supply for courtesy and has an appointment on 11/05/21. Refill will be done on the appointment day.

## 2021-11-05 ENCOUNTER — Ambulatory Visit: Payer: 59 | Admitting: Nurse Practitioner

## 2021-11-05 ENCOUNTER — Other Ambulatory Visit: Payer: Self-pay

## 2021-11-05 ENCOUNTER — Encounter: Payer: Self-pay | Admitting: Nurse Practitioner

## 2021-11-05 VITALS — BP 128/76 | HR 64 | Temp 97.9°F | Resp 16 | Ht 62.0 in | Wt 217.7 lb

## 2021-11-05 DIAGNOSIS — Z794 Long term (current) use of insulin: Secondary | ICD-10-CM

## 2021-11-05 DIAGNOSIS — Z1211 Encounter for screening for malignant neoplasm of colon: Secondary | ICD-10-CM

## 2021-11-05 DIAGNOSIS — F410 Panic disorder [episodic paroxysmal anxiety] without agoraphobia: Secondary | ICD-10-CM | POA: Insufficient documentation

## 2021-11-05 DIAGNOSIS — E119 Type 2 diabetes mellitus without complications: Secondary | ICD-10-CM | POA: Diagnosis not present

## 2021-11-05 DIAGNOSIS — Z1212 Encounter for screening for malignant neoplasm of rectum: Secondary | ICD-10-CM

## 2021-11-05 DIAGNOSIS — G4700 Insomnia, unspecified: Secondary | ICD-10-CM

## 2021-11-05 DIAGNOSIS — E782 Mixed hyperlipidemia: Secondary | ICD-10-CM

## 2021-11-05 DIAGNOSIS — I10 Essential (primary) hypertension: Secondary | ICD-10-CM

## 2021-11-05 DIAGNOSIS — K219 Gastro-esophageal reflux disease without esophagitis: Secondary | ICD-10-CM

## 2021-11-05 DIAGNOSIS — F411 Generalized anxiety disorder: Secondary | ICD-10-CM

## 2021-11-05 DIAGNOSIS — F330 Major depressive disorder, recurrent, mild: Secondary | ICD-10-CM

## 2021-11-05 DIAGNOSIS — I491 Atrial premature depolarization: Secondary | ICD-10-CM

## 2021-11-05 MED ORDER — ROSUVASTATIN CALCIUM 40 MG PO TABS: 40.0000 mg | ORAL_TABLET | Freq: Every day | ORAL | 3 refills | Status: DC

## 2021-11-05 MED ORDER — LISINOPRIL-HYDROCHLOROTHIAZIDE 20-12.5 MG PO TABS: 1.0000 | ORAL_TABLET | Freq: Every day | ORAL | 3 refills | Status: DC

## 2021-11-05 MED ORDER — METOPROLOL SUCCINATE ER 25 MG PO TB24: 25.0000 mg | ORAL_TABLET | Freq: Every day | ORAL | 3 refills | Status: DC

## 2021-11-05 MED ORDER — FLUOXETINE HCL 20 MG PO TABS: 20.0000 mg | ORAL_TABLET | Freq: Every day | ORAL | 3 refills | Status: DC

## 2021-11-05 MED ORDER — PANTOPRAZOLE SODIUM 40 MG PO TBEC: 40.0000 mg | DELAYED_RELEASE_TABLET | Freq: Every day | ORAL | 3 refills | Status: DC

## 2021-11-05 MED ORDER — TRAZODONE HCL 50 MG PO TABS: ORAL_TABLET | ORAL | 2 refills | Status: DC

## 2021-11-05 NOTE — Assessment & Plan Note (Signed)
Patient reports that her mood has been pretty good.  Patient is taking fluoxetine 20 mg daily and trazodone 50 mg at bedtime.  She does have a prescription for Ativan 0.5 to 1 mg twice daily as needed but she states she has not had to use it in a long time.

## 2021-11-05 NOTE — Assessment & Plan Note (Signed)
Continue taking metoprolol 25 mg daily and lisinopril-hydrochlorothiazide 20-12.5 mg daily.

## 2021-11-05 NOTE — Assessment & Plan Note (Signed)
Patient reports that her mood has been pretty good.  Patient is taking fluoxetine 20 mg daily and trazodone 50 mg at bedtime.  She does have a prescription for Ativan 0.5 to 1 mg twice daily as needed but she states she has not had to use it in a long time. 

## 2021-11-05 NOTE — Progress Notes (Signed)
BP 128/76   Pulse 64   Temp 97.9 F (36.6 C) (Oral)   Resp 16   Ht 5\' 2"  (1.575 m)   Wt 217 lb 11.2 oz (98.7 kg)   SpO2 99%   BMI 39.82 kg/m    Subjective:    Patient ID: , female    DOB: 1960-12-30, 61 y.o.   MRN: [id number]  HPI: Alicia Pope is a 61 y.o. female  Chief Complaint  Patient presents with   Diabetes   Hypertension   Gastroesophageal Reflux   Hyperlipidemia   Diabetes: Patient sees Dr. 77 endocrinologist for her diabetes.  Her last A1c was 6.7 on 08/06/2021.  Patient reports her blood sugars have been running 100s.  She is currently taking Lantus 55 units in the morning, Ozempic 1 mg once a week.  Patient states she is not really liking the Ozempic and is going to discuss with her endocrinologist about switching back to Trulicity.  Hypertension: Patient is currently taking metoprolol 25 mg daily and lisinopril-hydrochlorothiazide 20-12.5 mg daily.  Her blood pressure today is 128/76.  Patient denies any chest pain, shortness of breath, headaches or blurred vision.  PAC/palpitations: Patient states that the metoprolol 25 mg daily helps with her palpitations.  We will continue with current treatment.  GERD: Patient reports her acid reflux is well controlled with pantoprazole 40 mg daily.  Hyperlipidemia: Her last LDL was 89 on 05/01/2020.  She is currently taking rosuvastatin 40 mg daily.  She denies any myalgia.  Depression/anxiety/insomnia: Patient currently takes fluoxetine 20 mg daily, Ativan 0.5 to 1 mg twice a day as needed, and trazodone 50 mg at bedtime as needed for sleep.  States she has not had to use the Ativan in quite some time.  Patient states her mood is pretty good.    11/05/2021    1:41 PM 02/05/2021    9:06 AM 08/04/2020    9:02 AM 05/01/2020    1:25 PM 11/09/2019    9:09 AM  Depression screen PHQ 2/9  Decreased Interest 0 1 0 0 0  Down, Depressed, Hopeless 0 0 0 2 0  PHQ - 2 Score 0 1 0 2 0  Altered sleeping 3 3 0 3 0   Tired, decreased energy 0 1 0 1 0  Change in appetite 0 0 0 1 0  Feeling bad or failure about yourself  1 0 0 1 0  Trouble concentrating 1 0 0 1 0  Moving slowly or fidgety/restless 0 0 0 0 0  Suicidal thoughts 0 0 0 0 0  PHQ-9 Score 5 5 0 9 0  Difficult doing work/chores Not difficult at all Not difficult at all Not difficult at all Somewhat difficult Not difficult at all    Relevant past medical, surgical, family and social history reviewed and updated as indicated. Interim medical history since our last visit reviewed. Allergies and medications reviewed and updated.  Review of Systems  Constitutional: Negative for fever or weight change.  Respiratory: Negative for cough and shortness of breath.   Cardiovascular: Negative for chest pain or palpitations.  Gastrointestinal: Negative for abdominal pain, no bowel changes.  Musculoskeletal: Negative for gait problem or joint swelling.  Skin: Negative for rash.  Neurological: Negative for dizziness or headache.  No other specific complaints in a complete review of systems (except as listed in HPI above).      Objective:    BP 128/76   Pulse 64   Temp 97.9 F (  36.6 C) (Oral)   Resp 16   Ht 5\' 2"  (1.575 m)   Wt 217 lb 11.2 oz (98.7 kg)   SpO2 99%   BMI 39.82 kg/m   Wt Readings from Last 3 Encounters:  11/05/21 217 lb 11.2 oz (98.7 kg)  08/06/21 218 lb 12.8 oz (99.2 kg)  05/05/21 218 lb 9.6 oz (99.2 kg)    Physical Exam  Constitutional: Patient appears well-developed and well-nourished. Obese  No distress.  HEENT: head atraumatic, normocephalic, pupils equal and reactive to light,  neck supple Cardiovascular: Normal rate, regular rhythm and normal heart sounds.  No murmur heard. No BLE edema. Pulmonary/Chest: Effort normal and breath sounds normal. No respiratory distress. Abdominal: Soft.  There is no tenderness. Psychiatric: Patient has a normal mood and affect. behavior is normal. Judgment and thought content normal.   Results for orders placed or performed in visit on 08/06/21  POCT HgB A1C  Result Value Ref Range   Hemoglobin A1C 6.7 (A) 4.0 - 5.6 %   HbA1c POC (<> result, manual entry)     HbA1c, POC (prediabetic range)     HbA1c, POC (controlled diabetic range)        Assessment & Plan:   Problem List Items Addressed This Visit       Cardiovascular and Mediastinum   Essential hypertension - Primary (Chronic)    Continue taking metoprolol 25 mg daily and lisinopril-hydrochlorothiazide 20-12.5 mg daily.      Relevant Medications   rosuvastatin (CRESTOR) 40 MG tablet   metoprolol succinate (TOPROL-XL) 25 MG 24 hr tablet   lisinopril-hydrochlorothiazide (ZESTORETIC) 20-12.5 MG tablet   Other Relevant Orders   CBC with Differential/Platelet   COMPLETE METABOLIC PANEL WITH GFR   PAC (premature atrial contraction) (Chronic)    Patient states that the metoprolol 25 mg daily helps with her palpitations.  We will continue with current treatment.      Relevant Medications   rosuvastatin (CRESTOR) 40 MG tablet   metoprolol succinate (TOPROL-XL) 25 MG 24 hr tablet   lisinopril-hydrochlorothiazide (ZESTORETIC) 20-12.5 MG tablet     Digestive   Gastroesophageal reflux disease    Patient stable continue taking pantoprazole 40 mg daily.      Relevant Medications   pantoprazole (PROTONIX) 40 MG tablet     Endocrine   Diabetes mellitus, type II, insulin dependent (HCC) (Chronic)    Patient is currently taking Lantus 55 units in the morning and Ozempic 1 mg once a week.  Patient states she is not really liking the Ozempic and would like to switch back to Trulicity she is going to discuss this with her endocrinologist.  Her last A1c was 6.7 on 08/06/2021.      Relevant Medications   rosuvastatin (CRESTOR) 40 MG tablet   lisinopril-hydrochlorothiazide (ZESTORETIC) 20-12.5 MG tablet     Other   Mixed hyperlipidemia   Relevant Medications   rosuvastatin (CRESTOR) 40 MG tablet   metoprolol  succinate (TOPROL-XL) 25 MG 24 hr tablet   lisinopril-hydrochlorothiazide (ZESTORETIC) 20-12.5 MG tablet   Other Relevant Orders   Lipid panel   Screening for colorectal cancer   Relevant Orders   Cologuard   Insomnia    Patient reports that her mood has been pretty good.  Patient is taking fluoxetine 20 mg daily and trazodone 50 mg at bedtime.  She does have a prescription for Ativan 0.5 to 1 mg twice daily as needed but she states she has not had to use it in a long time.  Relevant Medications   traZODone (DESYREL) 50 MG tablet   Mild episode of recurrent major depressive disorder [facility])    Patient reports that her mood has been pretty good.  Patient is taking fluoxetine 20 mg daily and trazodone 50 mg at bedtime.  She does have a prescription for Ativan 0.5 to 1 mg twice daily as needed but she states she has not had to use it in a long time.      Relevant Medications   traZODone (DESYREL) 50 MG tablet   FLUoxetine (PROZAC) 20 MG tablet   Generalized anxiety disorder with panic attacks    Patient reports that her mood has been pretty good.  Patient is taking fluoxetine 20 mg daily and trazodone 50 mg at bedtime.  She does have a prescription for Ativan 0.5 to 1 mg twice daily as needed but she states she has not had to use it in a long time.      Relevant Medications   traZODone (DESYREL) 50 MG tablet   FLUoxetine (PROZAC) 20 MG tablet     Follow up plan: Return in about 6 months (around 05/08/2022) for follow up.

## 2021-11-05 NOTE — Assessment & Plan Note (Signed)
Patient is currently taking Lantus 55 units in the morning and Ozempic 1 mg once a week.  Patient states she is not really liking the Ozempic and would like to switch back to Trulicity she is going to discuss this with her endocrinologist.  Her last A1c was 6.7 on 08/06/2021.

## 2021-11-05 NOTE — Assessment & Plan Note (Signed)
Patient stable continue taking pantoprazole 40 mg daily.

## 2021-11-05 NOTE — Assessment & Plan Note (Signed)
Patient states that the metoprolol 25 mg daily helps with her palpitations.  We will continue with current treatment.

## 2021-11-06 LAB — CBC WITH DIFFERENTIAL/PLATELET
Absolute Monocytes: 428 cells/uL (ref 200–950)
Basophils Absolute: 50 cells/uL (ref 0–200)
Basophils Relative: 0.8 %
Eosinophils Absolute: 139 cells/uL (ref 15–500)
Eosinophils Relative: 2.2 %
HCT: 45.8 % — ABNORMAL HIGH (ref 35.0–45.0)
Hemoglobin: 15.5 g/dL (ref 11.7–15.5)
Lymphs Abs: 2104 cells/uL (ref 850–3900)
MCH: 30.2 pg (ref 27.0–33.0)
MCHC: 33.8 g/dL (ref 32.0–36.0)
MCV: 89.1 fL (ref 80.0–100.0)
MPV: 10.2 fL (ref 7.5–12.5)
Monocytes Relative: 6.8 %
Neutro Abs: 3578 cells/uL (ref 1500–7800)
Neutrophils Relative %: 56.8 %
Platelets: 206 10*3/uL (ref 140–400)
RBC: 5.14 10*6/uL — ABNORMAL HIGH (ref 3.80–5.10)
RDW: 13.1 % (ref 11.0–15.0)
Total Lymphocyte: 33.4 %
WBC: 6.3 10*3/uL (ref 3.8–10.8)

## 2021-11-06 LAB — LIPID PANEL
Cholesterol: 187 mg/dL (ref <200)
HDL: 58 mg/dL (ref >=50)
LDL Cholesterol (Calc): 92 mg/dL (calc)
Non-HDL Cholesterol (Calc): 129 mg/dL (calc) (ref <130)
Total CHOL/HDL Ratio: 3.2 (calc) (ref <5.0)
Triglycerides: 281 mg/dL — ABNORMAL HIGH (ref <150)

## 2021-11-06 LAB — COMPLETE METABOLIC PANEL WITH GFR
AG Ratio: 1.6 (calc) (ref 1.0–2.5)
ALT: 20 U/L (ref 6–29)
AST: 19 U/L (ref 10–35)
Albumin: 4.3 g/dL (ref 3.6–5.1)
Alkaline phosphatase (APISO): 55 U/L (ref 37–153)
BUN: 11 mg/dL (ref 7–25)
CO2: 27 mmol/L (ref 20–32)
Calcium: 9.7 mg/dL (ref 8.6–10.4)
Chloride: 101 mmol/L (ref 98–110)
Creat: 0.81 mg/dL (ref 0.50–1.05)
Globulin: 2.7 g/dL (calc) (ref 1.9–3.7)
Glucose, Bld: 186 mg/dL — ABNORMAL HIGH (ref 65–99)
Potassium: 4.3 mmol/L (ref 3.5–5.3)
Sodium: 139 mmol/L (ref 135–146)
Total Bilirubin: 0.5 mg/dL (ref 0.2–1.2)
Total Protein: 7 g/dL (ref 6.1–8.1)
eGFR: 83 mL/min/{1.73_m2} (ref >=60)

## 2021-12-01 ENCOUNTER — Ambulatory Visit: Payer: 59 | Admitting: Internal Medicine

## 2021-12-01 NOTE — Progress Notes (Deleted)
Name: Alicia Pope  Age/ Sex: 61 y.o., female   MRN/ DOB: [id number], 28-Jun-1960     PCP: Alicia Berry, Alicia Pope   Reason for Endocrinology Evaluation: Type 2 Diabetes Mellitus  Initial Endocrine Consultative Visit: 03/15/2018    PATIENT IDENTIFIER: Alicia Pope is a 61 y.o. female with a past medical history of DM, HTN, GERD and dyslipidemia. The patient has followed with Endocrinology clinic since *** for consultative assistance with management of her diabetes.  DIABETIC HISTORY:  Alicia Pope was diagnosed with DM 2004, and started insulin therapy in 2018, she is intolerant to Invokana due to recurrent vaginitis.Marland Kitchen Her hemoglobin A1c has ranged from 5.7% in 2020, peaking at 9.2% in 2021.   She was followed up by Alicia Pope between 2019 and May 2023  SUBJECTIVE:   During the last visit (08/06/2021): Alicia Pope  Today (12/01/2021): Alicia Pope is here for follow-up on diabetes management.  She checks her blood sugars *** times daily, preprandial to breakfast and ***. The patient has *** had hypoglycemic episodes since the last clinic visit, which typically occur *** x / - most often occuring ***. The patient is *** symptomatic with these episodes, with symptoms of {symptoms; hypoglycemia:9084048}.   HOME DIABETES REGIMEN:  Lantus Ozempic 1 mg weekly  Statin: Yes ACE-I/ARB: Yes    METER DOWNLOAD SUMMARY: Date range evaluated: *** Fingerstick Blood Glucose Tests = *** Average Number Tests/Day = *** Overall Mean FS Glucose = *** Standard Deviation = ***  BG Ranges: Low = *** High = ***   Hypoglycemic Events/30 Days: BG < 50 = *** Episodes of symptomatic severe hypoglycemia = ***    DIABETIC COMPLICATIONS: Microvascular complications:  *** Denies: CKD Last Eye Exam: Completed   Macrovascular complications:   Denies: CAD, CVA, PVD   HISTORY:  Past Medical History:  Past Medical History:  Diagnosis Date   Allergy    Anxiety 2013   Asthma 2015   Asthma     Depression 2013   Diabetes mellitus 2004   Finding of multiple premature atrial contractions by electrocardiography    GERD (gastroesophageal reflux disease) 2013   Hyperlipidemia 1992   Hypertension 1992   Memory impairment    Past Surgical History:  Past Surgical History:  Procedure Laterality Date   ANTERIOR CRUCIATE LIGAMENT REPAIR  1997   APPENDECTOMY  1987   CARDIAC EVENT MONITOR  03/2019   No average rate 60 bpm.  Max HR 122, minimum HR 36 bpm-sinus bradycardia with PACs and bigeminy.  Frequent PACs seen as isolated, couplets, triplets as well as frequent bigeminy.:   CESAREAN SECTION     [phone]   CHOLECYSTECTOMY  1987   NM MYOVIEW LTD  05/2019   Normal EF 60%.  Poor control hypertension.  (194/101 mmHg).  Small size moderate severity fixed defect in the mid anterior and apical anterior wall likely representing breast attenuation artifact.  No evidence of ischemia (reversibility).  LOW RISK   TRANSTHORACIC ECHOCARDIOGRAM  03/2019   EF 55-60 % with normal wall motion.  Slight LA dilation with grade 1 diastolic function.  Moderate aortic valve calcification-sclerosis without stenosis.  Otherwise normal.   uterine ablation     Social History:  reports that she has never smoked. She has never used smokeless tobacco. She reports that she does not drink alcohol and does not use drugs. Family History:  Family History  Problem Relation Age of Onset   Cancer Father        Lymphoma, Leukemia  Depression Father    Cancer Maternal Grandfather    Cancer Paternal Grandmother    Diabetes Paternal Grandfather    Kidney disease Paternal Grandfather      HOME MEDICATIONS: Allergies as of 12/01/2021       Reactions   Contrast Media [iodinated Contrast Media] Anaphylaxis   Ambien [zolpidem Tartrate] Other (See Comments)   Family has told her that after taking Ambien, she does "crazy things" that she does not remember doing.    Codeine Nausea And Vomiting        Medication  List        Accurate as of December 01, 2021  7:02 AM. If you have any questions, ask your nurse or doctor.          aspirin 81 MG tablet Take 1 tablet (81 mg total) by mouth daily.   [employer] D3 50 MCG (2000 UT) Caps Generic drug: Cholecalciferol Take 2 capsules (4,000 Units total) by mouth daily.   FLUoxetine 20 MG tablet Commonly known as: PROZAC Take 1 tablet (20 mg total) by mouth daily.   Lantus SoloStar 100 UNIT/ML Solostar Pen Generic drug: insulin glargine Inject 55 Units into the skin every morning.   lisinopril-hydrochlorothiazide 20-12.5 MG tablet Commonly known as: ZESTORETIC Take 1 tablet by mouth daily.   LORazepam 1 MG tablet Commonly known as: ATIVAN TAKE 1/2 TABLET TO 1 TABLET UP TO TWICE A DAY AS NEEDED SPARINGLY FOR ANXIETY OR SEVERE INSOMNIA   metoprolol succinate 25 MG 24 hr tablet Commonly known as: TOPROL-XL Take 1 tablet (25 mg total) by mouth daily.   pantoprazole 40 MG tablet Commonly known as: PROTONIX Take 1 tablet (40 mg total) by mouth daily.   rosuvastatin 40 MG tablet Commonly known as: CRESTOR Take 1 tablet (40 mg total) by mouth daily.   Semaglutide (1 MG/DOSE) 4 MG/3ML Sopn Inject 1 mg as directed once a week.   tiZANidine 4 MG tablet Commonly known as: ZANAFLEX SMARTSIG:3 Tablet(s) By Mouth Every Other Day PRN   traZODone 50 MG tablet Commonly known as: DESYREL TAKE 1/2 UP TO 2 TABLETS BY MOUTH AT BEDTIME AS NEEDED FOR SLEEP         OBJECTIVE:   Vital Signs: There were no vitals taken for this visit.  Wt Readings from Last 3 Encounters:  11/05/21 217 lb 11.2 oz (98.7 kg)  08/06/21 218 lb 12.8 oz (99.2 kg)  05/05/21 218 lb 9.6 oz (99.2 kg)     Exam: General: Pt appears well and is in NAD  Hydration: Well-hydrated with moist mucous membranes and good skin turgor  HEENT: Head: Unremarkable with good dentition. Oropharynx clear without exudate.  Eyes: External eye exam normal without stare, lid lag or exophthalmos.   EOM intact.  PERRL.  Neck: General: Supple without adenopathy. Thyroid: Thyroid size normal.  No goiter or nodules appreciated. No thyroid bruit.  Lungs: Clear with good BS bilat with no rales, rhonchi, or wheezes  Heart: RRR with normal S1 and S2 and no gallops; no murmurs; no rub  Abdomen: Normoactive bowel sounds, soft, nontender, without masses or organomegaly palpable  Extremities: No pretibial edema. No tremor. Normal strength and motion throughout. See detailed diabetic foot exam below.  Skin: Normal texture and temperature to palpation. No rash noted. No Acanthosis nigricans/skin tags. No lipohypertrophy.  Neuro: MS is good with appropriate affect, pt is alert and Ox3    DM foot exam: Please see diabetic assessment flow-sheet detailed below:  DATA REVIEWED:  Lab Results  Component Value Date   HGBA1C 6.7 (A) 08/06/2021   HGBA1C 8.2 (A) 05/05/2021   HGBA1C 6.8 (A) 12/31/2020   Lab Results  Component Value Date   MICROALBUR 1.2 08/22/2017   LDLCALC 92 11/05/2021   CREATININE 0.81 11/05/2021   No results found for: "MICRALBCREAT"   Lab Results  Component Value Date   CHOL 187 11/05/2021   HDL 58 11/05/2021   LDLCALC 92 11/05/2021   TRIG 281 (H) 11/05/2021   CHOLHDL 3.2 11/05/2021         ASSESSMENT / PLAN / RECOMMENDATIONS:   1) Type {NUMBERS 1 OR 2:522190} Diabetes Mellitus, ***controlled, With *** complications - Most recent A1c of *** %. Goal A1c < *** %.  ***  Plan: MEDICATIONS: ***  EDUCATION / INSTRUCTIONS: BG monitoring instructions: Patient is instructed to check her blood sugars *** times a day, ***. Call [facility] if: BG persistently < 70 or > 300. I reviewed the Rule of 15 for the treatment of hypoglycemia in detail with the patient. Literature supplied.  REFERRALS: ***.   2) Diabetic complications:  Eye: Does *** have known diabetic retinopathy.  Neuro/ Feet: Does *** have known diabetic peripheral neuropathy  .  Renal: Patient does *** have known baseline CKD. She   is *** on an ACEI/ARB at present. Check urine albumin/creatinine ratio yearly starting at time of diagnosis. If albuminuria is positive, treatment is geared toward better glucose, blood pressure control and use of ACE inhibitors or ARBs. Monitor electrolytes and creatinine once to twice yearly.   3) Lipids: Patient is *** on a statin.  4) Hypertension: *** at goal of < 140/90 mmHg.    F/U in ***    Signed electronically by: Lyndle Herrlich, Alicia Pope  [facility] Endocrinology  [facility] [address] [place], [place] [address] Phone: [phone] FAX: [phone]   CC: Sander Radon [address] [place] [address] Phone: [phone]  Fax: [phone]  Return to Endocrinology clinic as below: Future Appointments  Date Time Provider Department Center  12/01/2021  8:50 AM Alicia Pope, Alicia Dolores, Alicia Pope [facility] None  05/10/2022  1:40 PM Alicia Berry, Alicia Pope [facility]

## 2021-12-04 ENCOUNTER — Encounter: Payer: Self-pay | Admitting: Internal Medicine

## 2021-12-04 ENCOUNTER — Ambulatory Visit: Payer: 59 | Admitting: Internal Medicine

## 2021-12-04 VITALS — BP 120/78 | HR 58 | Ht 62.0 in | Wt 217.0 lb

## 2021-12-04 DIAGNOSIS — E785 Hyperlipidemia, unspecified: Secondary | ICD-10-CM

## 2021-12-04 DIAGNOSIS — E1165 Type 2 diabetes mellitus with hyperglycemia: Secondary | ICD-10-CM | POA: Diagnosis not present

## 2021-12-04 LAB — POCT GLYCOSYLATED HEMOGLOBIN (HGB A1C): Hemoglobin A1C: 7.4 % — AB (ref 4.0–5.6)

## 2021-12-04 MED ORDER — SEMAGLUTIDE (2 MG/DOSE) 8 MG/3ML ~~LOC~~ SOPN: 2.0000 mg | PEN_INJECTOR | SUBCUTANEOUS | 3 refills | Status: DC

## 2021-12-04 MED ORDER — DEXCOM G7 SENSOR MISC: 1.0000 | 3 refills | Status: DC

## 2021-12-04 MED ORDER — INSULIN PEN NEEDLE 31G X 5 MM MISC: 1.0000 | Freq: Every day | 3 refills | Status: DC

## 2021-12-04 MED ORDER — LANTUS SOLOSTAR 100 UNIT/ML ~~LOC~~ SOPN: 56.0000 [IU] | PEN_INJECTOR | SUBCUTANEOUS | 3 refills | Status: DC

## 2021-12-04 NOTE — Patient Instructions (Signed)
Increase Ozempic 2 mg weekly  Take Lantus 56 units daily     HOW TO TREAT LOW BLOOD SUGARS (Blood sugar LESS THAN 70 MG/DL) Please follow the RULE OF 15 for the treatment of hypoglycemia treatment (when your (blood sugars are less than 70 mg/dL)   STEP 1: Take 15 grams of carbohydrates when your blood sugar is low, which includes:  3-4 GLUCOSE TABS  OR 3-4 OZ OF JUICE OR REGULAR SODA OR ONE TUBE OF GLUCOSE GEL    STEP 2: RECHECK blood sugar in 15 MINUTES STEP 3: If your blood sugar is still low at the 15 minute recheck --> then, go back to STEP 1 and treat AGAIN with another 15 grams of carbohydrates.

## 2021-12-04 NOTE — Progress Notes (Signed)
Name: Alicia Pope  Age/ Sex: 61 y.o., female   MRN/ DOB: [id number], 11/30/1960     PCP: Danelle Berry, PA-C   Reason for Endocrinology Evaluation: Type 2 Diabetes Mellitus  Initial Endocrine Consultative Visit: 03/15/2018    PATIENT IDENTIFIER: [name] is a 61 y.o. female with a past medical history of DM, HTN, GERD and dyslipidemia. The patient has followed with Endocrinology clinic since 03/15/2018 for consultative assistance with management of her diabetes.  DIABETIC HISTORY:  [name] was diagnosed with DM 2004, and started insulin therapy in 2018, she is intolerant to Invokana due to recurrent vaginitis.Marland Kitchen Her hemoglobin A1c has ranged from 5.7% in 2020, peaking at 9.2% in 2021.   She was followed up by Dr. Everardo All between 2019 and May 2023  SUBJECTIVE:   During the last visit (08/06/2021): Dr. Everardo All  Today (12/04/2021): Ms. [name] is here for follow-up on diabetes management.  She is accompanied by her son today.  She checks her blood sugars occasionally, test strips are cost prohibitive. The patient has not  had hypoglycemic episodes since the last clinic visit  Denies nausea, vomiting or diarrhea      HOME DIABETES REGIMEN:  Lantus 57 units daily  Ozempic 1 mg weekly  Statin: Yes ACE-I/ARB: Yes    METER DOWNLOAD SUMMARY: Date range evaluated: 8/2-12/04/2021 Fingerstick Blood Glucose Tests = 12 Overall Mean FS Glucose = 153 Standard Deviation = 36  BG Ranges: Low = 119 High = 243   Hypoglycemic Events/30 Days: BG < 50 = 0 Episodes of symptomatic severe hypoglycemia = 0    DIABETIC COMPLICATIONS: Microvascular complications:   Denies: CKD, retinopathy, neuropathy  Last Eye Exam: Completed 2022  Macrovascular complications:   Denies: CAD, CVA, PVD   HISTORY:  Past Medical History:  Past Medical History:  Diagnosis Date   Allergy    Anxiety 2013   Asthma 2015   Asthma    Depression 2013   Diabetes mellitus 2004   Finding of  multiple premature atrial contractions by electrocardiography    GERD (gastroesophageal reflux disease) 2013   Hyperlipidemia 1992   Hypertension 1992   Memory impairment    Past Surgical History:  Past Surgical History:  Procedure Laterality Date   ANTERIOR CRUCIATE LIGAMENT REPAIR  1997   APPENDECTOMY  1987   CARDIAC EVENT MONITOR  03/2019   No average rate 60 bpm.  Max HR 122, minimum HR 36 bpm-sinus bradycardia with PACs and bigeminy.  Frequent PACs seen as isolated, couplets, triplets as well as frequent bigeminy.:   CESAREAN SECTION     [phone]   CHOLECYSTECTOMY  1987   NM MYOVIEW LTD  05/2019   Normal EF 60%.  Poor control hypertension.  (194/101 mmHg).  Small size moderate severity fixed defect in the mid anterior and apical anterior wall likely representing breast attenuation artifact.  No evidence of ischemia (reversibility).  LOW RISK   TRANSTHORACIC ECHOCARDIOGRAM  03/2019   EF 55-60 % with normal wall motion.  Slight LA dilation with grade 1 diastolic function.  Moderate aortic valve calcification-sclerosis without stenosis.  Otherwise normal.   uterine ablation     Social History:  reports that she has never smoked. She has never used smokeless tobacco. She reports that she does not drink alcohol and does not use drugs. Family History:  Family History  Problem Relation Age of Onset   Cancer Father        Lymphoma, Leukemia   Depression Father  Cancer Maternal Grandfather    Cancer Paternal Grandmother    Diabetes Paternal Grandfather    Kidney disease Paternal Grandfather      HOME MEDICATIONS: Allergies as of 12/04/2021       Reactions   Contrast Media [iodinated Contrast Media] Anaphylaxis   Ambien [zolpidem Tartrate] Other (See Comments)   Family has told her that after taking Ambien, she does "crazy things" that she does not remember doing.    Codeine Nausea And Vomiting        Medication List        Accurate as of December 04, 2021  2:19 PM.  If you have any questions, ask your nurse or doctor.          STOP taking these medications    Semaglutide (1 MG/DOSE) 4 MG/3ML Sopn Replaced by: Semaglutide (2 MG/DOSE) 8 MG/3ML Sopn Stopped by: Dorita Sciara, MD       TAKE these medications    aspirin 81 MG tablet Take 1 tablet (81 mg total) by mouth daily.   [employer]S D3 50 MCG (2000 UT) Caps Generic drug: Cholecalciferol Take 2 capsules (4,000 Units total) by mouth daily.   Dexcom G7 Sensor Misc 1 Device by Does not apply route as directed. Started by: Dorita Sciara, MD   FLUoxetine 20 MG tablet Commonly known as: PROZAC Take 1 tablet (20 mg total) by mouth daily.   Insulin Pen Needle 31G X 5 MM Misc 1 Device by Does not apply route daily in the afternoon. Started by: Dorita Sciara, MD   Lantus SoloStar 100 UNIT/ML Solostar Pen Generic drug: insulin glargine Inject 56 Units into the skin every morning. What changed: how much to take Changed by: Dorita Sciara, MD   lisinopril-hydrochlorothiazide 20-12.5 MG tablet Commonly known as: ZESTORETIC Take 1 tablet by mouth daily.   LORazepam 1 MG tablet Commonly known as: ATIVAN TAKE 1/2 TABLET TO 1 TABLET UP TO TWICE A DAY AS NEEDED SPARINGLY FOR ANXIETY OR SEVERE INSOMNIA   metoprolol succinate 25 MG 24 hr tablet Commonly known as: TOPROL-XL Take 1 tablet (25 mg total) by mouth daily.   pantoprazole 40 MG tablet Commonly known as: PROTONIX Take 1 tablet (40 mg total) by mouth daily.   rosuvastatin 40 MG tablet Commonly known as: CRESTOR Take 1 tablet (40 mg total) by mouth daily.   Semaglutide (2 MG/DOSE) 8 MG/3ML Sopn Inject 2 mg as directed once a week. Replaces: Semaglutide (1 MG/DOSE) 4 MG/3ML Sopn Started by: Dorita Sciara, MD   tiZANidine 4 MG tablet Commonly known as: ZANAFLEX SMARTSIG:3 Tablet(s) By Mouth Every Other Day PRN   traZODone 50 MG tablet Commonly known as: DESYREL TAKE 1/2 UP TO 2 TABLETS BY MOUTH  AT BEDTIME AS NEEDED FOR SLEEP         OBJECTIVE:   Vital Signs: BP 120/78 (BP Location: Left Arm, Patient Position: Sitting, Cuff Size: Large)   Pulse (!) 58   Ht $R'5\' 2"'Mi$  (1.575 m)   Wt 217 lb (98.4 kg)   SpO2 98%   BMI 39.69 kg/m   Wt Readings from Last 3 Encounters:  12/04/21 217 lb (98.4 kg)  11/05/21 217 lb 11.2 oz (98.7 kg)  08/06/21 218 lb 12.8 oz (99.2 kg)     Exam: General: Pt appears well and is in NAD  Neck: General: Supple without adenopathy. Thyroid: Thyroid size normal.  No goiter or nodules appreciated.   Lungs: Clear with good BS bilat with no rales,  rhonchi, or wheezes  Heart: RRR   Abdomen: Normoactive bowel sounds, soft, nontender, without masses or organomegaly palpable  Extremities: No pretibial edema.   Neuro: MS is good with appropriate affect, pt is alert and Ox3    DM foot exam: 12/04/2021  The skin of the feet is intact without sores or ulcerations. The pedal pulses are 2+ on right and 2+ on left. The sensation is intact to a screening 5.07, 10 gram monofilament bilaterally    DATA REVIEWED:  Lab Results  Component Value Date   HGBA1C 7.4 (A) 12/04/2021   HGBA1C 6.7 (A) 08/06/2021   HGBA1C 8.2 (A) 05/05/2021   Lab Results  Component Value Date   MICROALBUR 1.2 08/22/2017   LDLCALC 92 11/05/2021   CREATININE 0.81 11/05/2021   No results found for: "MICRALBCREAT"   Lab Results  Component Value Date   CHOL 187 11/05/2021   HDL 58 11/05/2021   LDLCALC 92 11/05/2021   TRIG 281 (H) 11/05/2021   CHOLHDL 3.2 11/05/2021        Latest Reference Range & Units 11/05/21 14:02  Sodium 135 - 146 mmol/L 139  Potassium 3.5 - 5.3 mmol/L 4.3  Chloride 98 - 110 mmol/L 101  CO2 20 - 32 mmol/L 27  Glucose 65 - 99 mg/dL 186 (H)  BUN 7 - 25 mg/dL 11  Creatinine 0.50 - 1.05 mg/dL 0.81  Calcium 8.6 - 10.4 mg/dL 9.7  BUN/Creatinine Ratio 6 - 22 (calc) SEE NOTE:  eGFR > OR = 60 mL/min/1.44m2 83  AG Ratio 1.0 - 2.5 (calc) 1.6  AST 10 - 35 U/L 19   ALT 6 - 29 U/L 20  Total Protein 6.1 - 8.1 g/dL 7.0  Total Bilirubin 0.2 - 1.2 mg/dL 0.5    ASSESSMENT / PLAN / RECOMMENDATIONS:   1) Type 2 Diabetes Mellitus, Sub-Optimally controlled, Without complications - Most recent A1c of 7.4 %. Goal A1c < 7.0 %.    -Unfortunately her A1c has increased from 6.9% to 7.4% -Today we discussed the importance of avoiding snacks and choosing low-carb options -She does not check her glucose on a regular basis, as the strips are costly and has been using her son's strips. -I did advise the patient to contact her insurance carrier and ask for the name of covered glucose meters and I will be happy to prescribe it for her -She is interested in Dexcom, a Dexcom G7 prescription has been sent to the pharmacy -We also discussed increasing her Ozempic since she is tolerating the current dose without issues    MEDICATIONS: Increase Ozempic 2 mg weekly Take Lantus 56 units daily  EDUCATION / INSTRUCTIONS: BG monitoring instructions: Patient is instructed to check her blood sugars 1 times a day, fast thing. Call [facility] if: BG persistently < 70  I reviewed the Rule of 15 for the treatment of hypoglycemia in detail with the patient. Literature supplied.   2) Diabetic complications:  Eye: Does not have known diabetic retinopathy.  Neuro/ Feet: Does not have known diabetic peripheral neuropathy .  Renal: Patient does not have known baseline CKD. She   is  on an ACEI/ARB at present.    3) Dyslipidemia:   -Her LDL is acceptable but it is not optimal at this time -She is on maximum dose of rosuvastatin -She would like to avoid any additional medications,, I emphasized the importance of low fat diet and eating more fresh fruits and vegetables, but she will need to portion control the  foods due to natural glucose content  F/U in 4 months   Signed electronically by: Mack Guise, MD  [facility] Endocrinology  [address] [place], [place] [address] Phone: [phone] FAX: [phone]   CC: Laurell Roof [address] [place] [address] Phone: [phone]  Fax: [phone]  Return to Endocrinology clinic as below: Future Appointments  Date Time Provider [place]  04/27/2022 12:10 PM Sarenity Ramaker, Melanie Crazier, MD [facility] None  05/10/2022  1:40 PM Delsa Grana, PA-C [facility]

## 2021-12-08 ENCOUNTER — Encounter: Payer: Self-pay | Admitting: Internal Medicine

## 2021-12-12 ENCOUNTER — Other Ambulatory Visit: Payer: Self-pay | Admitting: Nurse Practitioner

## 2021-12-12 DIAGNOSIS — F411 Generalized anxiety disorder: Secondary | ICD-10-CM

## 2021-12-15 ENCOUNTER — Other Ambulatory Visit: Payer: Self-pay | Admitting: Nurse Practitioner

## 2021-12-15 DIAGNOSIS — F330 Major depressive disorder, recurrent, mild: Secondary | ICD-10-CM

## 2021-12-15 MED ORDER — FLUOXETINE HCL 20 MG PO CAPS: 20.0000 mg | ORAL_CAPSULE | Freq: Every day | ORAL | 3 refills | Status: DC

## 2021-12-15 NOTE — Telephone Encounter (Signed)
Requested medication (s) are due for refill today: no  Requested medication (s) are on the active medication list: yes  Last refill:  11/05/21 #90/3  Future visit scheduled: yes  Notes to clinic:  Alternative Requested:INSURANCE ONLY PAYS FOR CAPSULES. PLEASE SUBMIT NEW RX FOR CAPSULES.     Requested Prescriptions  Pending Prescriptions Disp Refills   FLUoxetine (PROZAC) 20 MG tablet [Pharmacy Med Name: FLUOXETINE HCL 20 MG TABLET] 90 tablet 3    Sig: TAKE 1 TABLET BY MOUTH EVERY DAY     Psychiatry:  Antidepressants - SSRI Passed - 12/12/2021 10:17 AM      Passed - Completed PHQ-2 or PHQ-9 in the last 360 days      Passed - Valid encounter within last 6 months    Recent Outpatient Visits           1 month ago Essential hypertension   [facility] Della Goo F, FNP   10 months ago Type 2 diabetes mellitus with hyperglycemia, without long-term current use of insulin [facility])   [facility] Danelle Berry, PA-C   1 year ago Type 2 diabetes mellitus with hyperglycemia, without long-term current use of insulin [facility])   [facility] Danelle Berry, PA-C   1 year ago Essential hypertension   [facility] Danelle Berry, PA-C   2 years ago Lower extremity edema   [facility] Danelle Berry, PA-C       Future Appointments             In 4 months Danelle Berry, PA-C [facility]

## 2021-12-16 LAB — COLOGUARD: COLOGUARD: NEGATIVE

## 2022-01-08 ENCOUNTER — Other Ambulatory Visit: Payer: Self-pay

## 2022-01-08 DIAGNOSIS — E1165 Type 2 diabetes mellitus with hyperglycemia: Secondary | ICD-10-CM

## 2022-01-08 MED ORDER — LANTUS SOLOSTAR 100 UNIT/ML ~~LOC~~ SOPN: 56.0000 [IU] | PEN_INJECTOR | SUBCUTANEOUS | 3 refills | Status: DC

## 2022-02-01 ENCOUNTER — Ambulatory Visit
Admission: RE | Admit: 2022-02-01 | Discharge: 2022-02-01 | Disposition: A | Discharge status: Home / Self Care | Payer: 59 | Source: Ambulatory Visit | Attending: Family Medicine | Admitting: Family Medicine

## 2022-02-01 ENCOUNTER — Ambulatory Visit
Admission: RE | Admit: 2022-02-01 | Discharge: 2022-02-01 | Disposition: A | Discharge status: Home / Self Care | Payer: 59 | Attending: Family Medicine | Admitting: Family Medicine

## 2022-02-01 ENCOUNTER — Encounter: Payer: Self-pay | Admitting: Family Medicine

## 2022-02-01 ENCOUNTER — Ambulatory Visit (INDEPENDENT_AMBULATORY_CARE_PROVIDER_SITE_OTHER): Payer: 59 | Admitting: Family Medicine

## 2022-02-01 VITALS — BP 128/72 | HR 79 | Temp 97.9°F | Resp 16 | Ht 62.0 in | Wt 213.7 lb

## 2022-02-01 DIAGNOSIS — J069 Acute upper respiratory infection, unspecified: Secondary | ICD-10-CM

## 2022-02-01 DIAGNOSIS — R0602 Shortness of breath: Secondary | ICD-10-CM | POA: Diagnosis not present

## 2022-02-01 DIAGNOSIS — Z8701 Personal history of pneumonia (recurrent): Secondary | ICD-10-CM | POA: Insufficient documentation

## 2022-02-01 DIAGNOSIS — J40 Bronchitis, not specified as acute or chronic: Secondary | ICD-10-CM

## 2022-02-01 LAB — POCT INFLUENZA A/B
Influenza A, POC: NEGATIVE
Influenza B, POC: NEGATIVE

## 2022-02-01 MED ORDER — PREDNISONE 20 MG PO TABS: ORAL_TABLET | ORAL | 0 refills | Status: DC

## 2022-02-01 MED ORDER — BENZONATATE 100 MG PO CAPS: 100.0000 mg | ORAL_CAPSULE | Freq: Three times a day (TID) | ORAL | 0 refills | Status: DC | PRN

## 2022-02-01 MED ORDER — ALBUTEROL SULFATE (2.5 MG/3ML) 0.083% IN NEBU
2.5000 mg | INHALATION_SOLUTION | Freq: Once | RESPIRATORY_TRACT | Status: AC
  Administered 2022-02-01: 2.5 mg via RESPIRATORY_TRACT

## 2022-02-01 MED ORDER — GUAIFENESIN ER 600 MG PO TB12: 600.0000 mg | ORAL_TABLET | Freq: Two times a day (BID) | ORAL | 0 refills | Status: DC

## 2022-02-01 MED ORDER — ALBUTEROL SULFATE HFA 108 (90 BASE) MCG/ACT IN AERS: 2.0000 | INHALATION_SPRAY | RESPIRATORY_TRACT | 2 refills | Status: DC | PRN

## 2022-02-01 NOTE — Progress Notes (Signed)
Name: Alicia Pope   MRN: [id number]    DOB: 16-Jan-1961   Date:02/01/2022       Progress Note  Chief Complaint  Patient presents with   Cough    Pt started sx last Thursday has got worst   Nasal Congestion   Chills     Subjective:   Alicia Pope is a 61 y.o. female, presents to clinic for illness, sx started 4 d ago, URI sx, cough that has worsened, chills.  URI sx, mostly runny nose, cough with dyspnea, feels extremely hard to breath, tried cordicidin but no improvement over the weekend.  No known sick contacts, son is here with her and starting to feel sick     Current Outpatient Medications:    aspirin 81 MG tablet, Take 1 tablet (81 mg total) by mouth daily., Disp: 30 tablet, Rfl: 11   Continuous Blood Gluc Sensor (DEXCOM G7 SENSOR) MISC, 1 Device by Does not apply route as directed., Disp: 9 each, Rfl: 3   CVS D3 2000 units CAPS, Take 2 capsules (4,000 Units total) by mouth daily., Disp: 30 each, Rfl: 3   FLUoxetine (PROZAC) 20 MG capsule, Take 1 capsule (20 mg total) by mouth daily., Disp: 90 capsule, Rfl: 3   insulin glargine (LANTUS SOLOSTAR) 100 UNIT/ML Solostar Pen, Inject 56 Units into the skin every morning., Disp: 60 mL, Rfl: 3   Insulin Pen Needle 31G X 5 MM MISC, 1 Device by Does not apply route daily in the afternoon., Disp: 100 each, Rfl: 3   lisinopril-hydrochlorothiazide (ZESTORETIC) 20-12.5 MG tablet, Take 1 tablet by mouth daily., Disp: 90 tablet, Rfl: 3   LORazepam (ATIVAN) 1 MG tablet, TAKE 1/2 TABLET TO 1 TABLET UP TO TWICE A DAY AS NEEDED SPARINGLY FOR ANXIETY OR SEVERE INSOMNIA, Disp: 30 tablet, Rfl: 0   metoprolol succinate (TOPROL-XL) 25 MG 24 hr tablet, Take 1 tablet (25 mg total) by mouth daily., Disp: 90 tablet, Rfl: 3   pantoprazole (PROTONIX) 40 MG tablet, Take 1 tablet (40 mg total) by mouth daily., Disp: 90 tablet, Rfl: 3   rosuvastatin (CRESTOR) 40 MG tablet, Take 1 tablet (40 mg total) by mouth daily., Disp: 90 tablet, Rfl: 3    Semaglutide, 2 MG/DOSE, 8 MG/3ML SOPN, Inject 2 mg as directed once a week., Disp: 9 mL, Rfl: 3   tiZANidine (ZANAFLEX) 4 MG tablet, SMARTSIG:3 Tablet(s) By Mouth Every Other Day PRN, Disp: , Rfl:    traZODone (DESYREL) 50 MG tablet, TAKE 1/2 UP TO 2 TABLETS BY MOUTH AT BEDTIME AS NEEDED FOR SLEEP, Disp: 180 tablet, Rfl: 2  Patient Active Problem List   Diagnosis Date Noted   Mild episode of recurrent major depressive disorder ([place]) 11/05/2021   Generalized anxiety disorder with panic attacks 11/05/2021   Chronic low back pain 08/04/2020   High risk medication use 05/01/2020   Aortic valve sclerosis 05/01/2020   PAC (premature atrial contraction) 04/24/2019   Sinus bradycardia, persistent 04/24/2019   Exertional dyspnea 69/67/8938   Systolic ejection murmur 01/19/5101   Diabetes mellitus, type II, insulin dependent ([place]) 04/29/2016   Insomnia 04/02/2015   Class 3 severe obesity with serious comorbidity and body mass index (BMI) of 40.0 to 44.9 in adult [facility]) 09/23/2014   Vitamin D deficiency 09/23/2014   Screening for colorectal cancer 09/23/2014   Anxiety    Depression    Gastroesophageal reflux disease    Essential hypertension    Mixed hyperlipidemia     Past Surgical History:  Procedure Laterality Date   ANTERIOR CRUCIATE LIGAMENT REPAIR  1997   APPENDECTOMY  1987   CARDIAC EVENT MONITOR  03/2019   No average rate 60 bpm.  Max HR 122, minimum HR 36 bpm-sinus bradycardia with PACs and bigeminy.  Frequent PACs seen as isolated, couplets, triplets as well as frequent bigeminy.:   CESAREAN SECTION     [phone]   CHOLECYSTECTOMY  1987   NM MYOVIEW LTD  05/2019   Normal EF 60%.  Poor control hypertension.  (194/101 mmHg).  Small size moderate severity fixed defect in the mid anterior and apical anterior wall likely representing breast attenuation artifact.  No evidence of ischemia (reversibility).  LOW RISK   TRANSTHORACIC ECHOCARDIOGRAM  03/2019   EF 55-60 % with normal wall  motion.  Slight LA dilation with grade 1 diastolic function.  Moderate aortic valve calcification-sclerosis without stenosis.  Otherwise normal.   uterine ablation      Family History  Problem Relation Age of Onset   Cancer Father        Lymphoma, Leukemia   Depression Father    Cancer Maternal Grandfather    Cancer Paternal Grandmother    Diabetes Paternal Grandfather    Kidney disease Paternal Grandfather     Social History   Tobacco Use   Smoking status: Never   Smokeless tobacco: Never  Substance Use Topics   Alcohol use: No   Drug use: No     Allergies  Allergen Reactions   Contrast Media [Iodinated Contrast Media] Anaphylaxis   Ambien [Zolpidem Tartrate] Other (See Comments)    Family has told her that after taking Ambien, she does "crazy things" that she does not remember doing.    Codeine Nausea And Vomiting    Health Maintenance  Topic Date Due   Diabetic kidney evaluation - Urine ACR  Never done   COLONOSCOPY (Pts 45-79yrs Insurance coverage will need to be confirmed)  Never done   PAP SMEAR-Modifier  11/21/2016   OPHTHALMOLOGY EXAM  10/04/2019   INFLUENZA VACCINE  11/03/2021   Zoster Vaccines- Shingrix (1 of 2) 02/05/2022 (Originally 06/08/1979)   MAMMOGRAM  04/29/2022   HEMOGLOBIN A1C  06/04/2022   Diabetic kidney evaluation - GFR measurement  11/06/2022   FOOT EXAM  12/05/2022   TETANUS/TDAP  11/06/2023   Hepatitis C Screening  Completed   HIV Screening  Completed   HPV VACCINES  Aged Out   COVID-19 Vaccine  Discontinued    Chart Review Today: I personally reviewed active problem list, medication list, allergies, family history, social history, health maintenance, notes from last encounter, lab results, imaging with the patient/caregiver today.   Review of Systems  Constitutional: Negative.   HENT: Negative.    Eyes: Negative.   Respiratory: Negative.    Cardiovascular: Negative.   Gastrointestinal: Negative.   Endocrine: Negative.    Genitourinary: Negative.   Musculoskeletal: Negative.   Skin: Negative.   Allergic/Immunologic: Negative.   Neurological: Negative.   Hematological: Negative.   Psychiatric/Behavioral: Negative.    All other systems reviewed and are negative.    Objective:   Vitals:   02/01/22 1433  BP: 128/72  Pulse: 79  Resp: 16  Temp: 97.9 F (36.6 C)  TempSrc: Oral  SpO2: 98%  Weight: 213 lb 11.2 oz (96.9 kg)  Height: 5\' 2"  (1.575 m)    Body mass index is 39.09 kg/m.  Physical Exam Vitals and nursing note reviewed.  Constitutional:      General: She is not in  acute distress.    Appearance: Normal appearance. She is well-developed. She is obese. She is not ill-appearing, toxic-appearing or diaphoretic.  HENT:     Head: Normocephalic and atraumatic.     Right Ear: External ear normal.     Left Ear: External ear normal.     Nose: Congestion and rhinorrhea present.     Mouth/Throat:     Mouth: Mucous membranes are moist.     Pharynx: Oropharynx is clear. No oropharyngeal exudate.  Eyes:     General: No scleral icterus.       Right eye: No discharge.        Left eye: No discharge.     Conjunctiva/sclera: Conjunctivae normal.  Neck:     Trachea: No tracheal deviation.  Cardiovascular:     Rate and Rhythm: Normal rate and regular rhythm.     Pulses: Normal pulses.     Heart sounds: Normal heart sounds.  Pulmonary:     Effort: Pulmonary effort is normal. No tachypnea, accessory muscle usage, respiratory distress or retractions.     Breath sounds: No stridor or transmitted upper airway sounds.     Comments: Diminished BS bilaterally at the bases worse R>L with faint wheeze  Left lower lung field coarse bs No crackles  Scattered rhonchi  Musculoskeletal:        General: Normal range of motion.  Skin:    General: Skin is warm and dry.     Findings: No rash.  Neurological:     Mental Status: She is alert.     Motor: No abnormal muscle tone.     Coordination: Coordination  normal.  Psychiatric:        Behavior: Behavior normal.         Assessment & Plan:  1. Upper respiratory tract infection, unspecified type Testing for COVID, likely viral etiology Flu negative Lungs very tight - CXR to r/o CAP - Novel Coronavirus, NAA ([facility]) - DG Chest 2 View - albuterol (PROVENTIL) (2.5 MG/3ML) 0.083% nebulizer solution 2.5 mg - POCT Influenza A/B - albuterol (VENTOLIN HFA) 108 (90 Base) MCG/ACT inhaler; Inhale 2 puffs into the lungs every 4 (four) hours as needed for wheezing or shortness of breath.  Dispense: 8 g; Refill: 2  2. History of pneumonia Afebrile, no sweats, but SOB, productive cough, fatigue - imaging ordered  - DG Chest 2 View - albuterol (PROVENTIL) (2.5 MG/3ML) 0.083% nebulizer solution 2.5 mg - albuterol (VENTOLIN HFA) 108 (90 Base) MCG/ACT inhaler; Inhale 2 puffs into the lungs every 4 (four) hours as needed for wheezing or shortness of breath.  Dispense: 8 g; Refill: 2  3. SOB (shortness of breath) SOB improved after neb  - DG Chest 2 View - albuterol (PROVENTIL) (2.5 MG/3ML) 0.083% nebulizer solution 2.5 mg - albuterol (VENTOLIN HFA) 108 (90 Base) MCG/ACT inhaler; Inhale 2 puffs into the lungs every 4 (four) hours as needed for wheezing or shortness of breath.  Dispense: 8 g; Refill: 2  4. Bronchitis Breath sounds improved after albuterol neb tx in office but she was very jittery and shakey - CBG checked and was normal We discussed bronchodilator SE and could try to get her xopenex if she needed it For now tx nasal discharge with antihistamines, cough with mucinex/tessalon, tightness/SOB with inhaler and will check the CXR to see if abx are needed  - albuterol (VENTOLIN HFA) 108 (90 Base) MCG/ACT inhaler; Inhale 2 puffs into the lungs every 4 (four) hours as needed for wheezing or shortness  of breath.  Dispense: 8 g; Refill: 2 - predniSONE (DELTASONE) 20 MG tablet; 2 tabs poqday 1-3, 1 tabs poqday 4-6  Dispense: 9 tablet; Refill: 0 -  guaiFENesin (MUCINEX) 600 MG 12 hr tablet; Take 1 tablet (600 mg total) by mouth 2 (two) times daily.  Dispense: 60 tablet; Refill: 0     We will call pt with lab results of CXR and COVID test Supportive and sx measures, isolate until test done, we discussed OTC meds that she could use - rest and pushing fluids advised as well ER if any worsening dyspnea than she currently has - she endorses severe SOB - said it feels like it would be easier just to stop breathing than to keep trying to breath - but she was able to speak in full and complete sentences, no retractions, accessory muscle use, she had normal RR and SpO2 at time of exam and was non-toxic appearing - looked tired    Danelle Berry, PA-C 02/01/22 2:56 PM

## 2022-02-03 LAB — NOVEL CORONAVIRUS, NAA: SARS-CoV-2, NAA: NOT DETECTED

## 2022-03-03 ENCOUNTER — Other Ambulatory Visit: Payer: Self-pay | Admitting: Internal Medicine

## 2022-03-03 MED ORDER — SEMAGLUTIDE (2 MG/DOSE) 8 MG/3ML ~~LOC~~ SOPN: 2.0000 mg | PEN_INJECTOR | SUBCUTANEOUS | 3 refills | Status: DC

## 2022-04-16 ENCOUNTER — Other Ambulatory Visit: Payer: Self-pay | Admitting: Family Medicine

## 2022-04-16 DIAGNOSIS — Z8701 Personal history of pneumonia (recurrent): Secondary | ICD-10-CM

## 2022-04-16 DIAGNOSIS — J40 Bronchitis, not specified as acute or chronic: Secondary | ICD-10-CM

## 2022-04-16 DIAGNOSIS — R0602 Shortness of breath: Secondary | ICD-10-CM

## 2022-04-16 DIAGNOSIS — J069 Acute upper respiratory infection, unspecified: Secondary | ICD-10-CM

## 2022-04-26 NOTE — Progress Notes (Signed)
Name: Alicia Pope  Age/ Sex: 62 y.o., female   MRN/ DOB: [id number], [birth year]     PCP: Danelle Berry, PA-C   Reason for Endocrinology Evaluation: Type 2 Diabetes Mellitus  Initial Endocrine Consultative Visit: 03/15/2018    PATIENT IDENTIFIER: Alicia Pope is a 62 y.o. female with a past medical history of DM, HTN, GERD and dyslipidemia. The patient has followed with Endocrinology clinic since 03/15/2018 for consultative assistance with management of her diabetes.  DIABETIC HISTORY:  Alicia Pope was diagnosed with DM 2004, and started insulin therapy in 2018, she is intolerant to Invokana due to recurrent vaginitis.Marland Kitchen Her hemoglobin A1c has ranged from 5.7% in 2020, peaking at 9.2% in 2021.   She was followed up by Dr. Everardo All between 2019 and May 2023  SUBJECTIVE:   During the last visit (12/04/2021): A1c 7.4%   Today (04/27/2022): Ms. [name] is here for follow-up on diabetes management.  She is accompanied by her son today.  She checks her blood sugars occasionally. The patient has not  had hypoglycemic episodes since the last clinic visit  Has occasional mild  nausea but no vomiting or diarrhea     HOME DIABETES REGIMEN:  Lantus 56 units daily - has been taking 62 units  Ozempic 2 mg weekly     Statin: Yes ACE-I/ARB: Yes    METER DOWNLOAD SUMMARY: n/a    DIABETIC COMPLICATIONS: Microvascular complications:   Denies: CKD, retinopathy, neuropathy  Last Eye Exam: Completed 2022  Macrovascular complications:   Denies: CAD, CVA, PVD   HISTORY:  Past Medical History:  Past Medical History:  Diagnosis Date   Allergy    Anxiety 2013   Asthma 2015   Asthma    Depression 2013   Diabetes mellitus 2004   Finding of multiple premature atrial contractions by electrocardiography    GERD (gastroesophageal reflux disease) 2013   Hyperlipidemia 1992   Hypertension 1992   Memory impairment    Past Surgical History:  Past Surgical History:  Procedure  Laterality Date   ANTERIOR CRUCIATE LIGAMENT REPAIR  1997   APPENDECTOMY  1987   CARDIAC EVENT MONITOR  03/2019   No average rate 60 bpm.  Max HR 122, minimum HR 36 bpm-sinus bradycardia with PACs and bigeminy.  Frequent PACs seen as isolated, couplets, triplets as well as frequent bigeminy.:   CESAREAN SECTION     [phone]   CHOLECYSTECTOMY  1987   NM MYOVIEW LTD  05/2019   Normal EF 60%.  Poor control hypertension.  (194/101 mmHg).  Small size moderate severity fixed defect in the mid anterior and apical anterior wall likely representing breast attenuation artifact.  No evidence of ischemia (reversibility).  LOW RISK   TRANSTHORACIC ECHOCARDIOGRAM  03/2019   EF 55-60 % with normal wall motion.  Slight LA dilation with grade 1 diastolic function.  Moderate aortic valve calcification-sclerosis without stenosis.  Otherwise normal.   uterine ablation     Social History:  reports that she has never smoked. She has never used smokeless tobacco. She reports that she does not drink alcohol and does not use drugs. Family History:  Family History  Problem Relation Age of Onset   Cancer Father        Lymphoma, Leukemia   Depression Father    Cancer Maternal Grandfather    Cancer Paternal Grandmother    Diabetes Paternal Grandfather    Kidney disease Paternal Grandfather      HOME MEDICATIONS: Allergies as of 04/27/2022  Reactions   Contrast Media [iodinated Contrast Media] Anaphylaxis   Ambien [zolpidem Tartrate] Other (See Comments)   Family has told her that after taking Ambien, she does "crazy things" that she does not remember doing.    Codeine Nausea And Vomiting        Medication List        Accurate as of April 27, 2022 12:18 PM. If you have any questions, ask your nurse or doctor.          albuterol 108 (90 Base) MCG/ACT inhaler Commonly known as: VENTOLIN HFA INHALE 2 PUFFS INTO THE LUNGS EVERY 4 HOURS AS NEEDED FOR WHEEZING OR SHORTNESS OF BREATH.    aspirin 81 MG tablet Take 1 tablet (81 mg total) by mouth daily.   benzonatate 100 MG capsule Commonly known as: TESSALON Take 1 capsule (100 mg total) by mouth 3 (three) times daily as needed for cough.   [employer] D3 50 MCG (2000 UT) Caps Generic drug: Cholecalciferol Take 2 capsules (4,000 Units total) by mouth daily.   Dexcom G7 Sensor Misc 1 Device by Does not apply route as directed.   FLUoxetine 20 MG capsule Commonly known as: PROZAC Take 1 capsule (20 mg total) by mouth daily.   guaiFENesin 600 MG 12 hr tablet Commonly known as: Mucinex Take 1 tablet (600 mg total) by mouth 2 (two) times daily.   Insulin Pen Needle 31G X 5 MM Misc 1 Device by Does not apply route daily in the afternoon.   Lantus SoloStar 100 UNIT/ML Solostar Pen Generic drug: insulin glargine Inject 56 Units into the skin every morning.   lisinopril-hydrochlorothiazide 20-12.5 MG tablet Commonly known as: ZESTORETIC Take 1 tablet by mouth daily.   LORazepam 1 MG tablet Commonly known as: ATIVAN TAKE 1/2 TABLET TO 1 TABLET UP TO TWICE A DAY AS NEEDED SPARINGLY FOR ANXIETY OR SEVERE INSOMNIA   metoprolol succinate 25 MG 24 hr tablet Commonly known as: TOPROL-XL Take 1 tablet (25 mg total) by mouth daily.   pantoprazole 40 MG tablet Commonly known as: PROTONIX Take 1 tablet (40 mg total) by mouth daily.   predniSONE 20 MG tablet Commonly known as: DELTASONE 2 tabs poqday 1-3, 1 tabs poqday 4-6   rosuvastatin 40 MG tablet Commonly known as: CRESTOR Take 1 tablet (40 mg total) by mouth daily.   Semaglutide (2 MG/DOSE) 8 MG/3ML Sopn Inject 2 mg as directed once a week.   tiZANidine 4 MG tablet Commonly known as: ZANAFLEX SMARTSIG:3 Tablet(s) By Mouth Every Other Day PRN   traZODone 50 MG tablet Commonly known as: DESYREL TAKE 1/2 UP TO 2 TABLETS BY MOUTH AT BEDTIME AS NEEDED FOR SLEEP         OBJECTIVE:   Vital Signs: BP 124/76 (BP Location: Right Arm, Patient Position: Sitting,  Cuff Size: Normal)   Pulse (!) 56   Ht 5\' 2"  (1.575 m)   Wt 209 lb (94.8 kg)   SpO2 99%   BMI 38.23 kg/m   Wt Readings from Last 3 Encounters:  04/27/22 209 lb (94.8 kg)  02/01/22 213 lb 11.2 oz (96.9 kg)  12/04/21 217 lb (98.4 kg)     Exam: General: Pt appears well and is in NAD  Lungs: Clear with good BS bilat with no rales, rhonchi, or wheezes  Heart: RRR   Neuro: MS is good with appropriate affect, pt is alert and Ox3    DM foot exam: 12/04/2021  The skin of the feet is intact without sores  or ulcerations. The pedal pulses are 2+ on right and 2+ on left. The sensation is intact to a screening 5.07, 10 gram monofilament bilaterally    DATA REVIEWED:  Lab Results  Component Value Date   HGBA1C 6.1 (A) 04/27/2022   HGBA1C 7.4 (A) 12/04/2021   HGBA1C 6.7 (A) 08/06/2021   Lab Results  Component Value Date   MICROALBUR 1.2 08/22/2017   LDLCALC 92 11/05/2021   CREATININE 0.81 11/05/2021   No results found for: "MICRALBCREAT"   Lab Results  Component Value Date   CHOL 187 11/05/2021   HDL 58 11/05/2021   LDLCALC 92 11/05/2021   TRIG 281 (H) 11/05/2021   CHOLHDL 3.2 11/05/2021        Latest Reference Range & Units 11/05/21 14:02  Sodium 135 - 146 mmol/L 139  Potassium 3.5 - 5.3 mmol/L 4.3  Chloride 98 - 110 mmol/L 101  CO2 20 - 32 mmol/L 27  Glucose 65 - 99 mg/dL 186 (H)  BUN 7 - 25 mg/dL 11  Creatinine 0.50 - 1.05 mg/dL 0.81  Calcium 8.6 - 10.4 mg/dL 9.7  BUN/Creatinine Ratio 6 - 22 (calc) SEE NOTE:  eGFR > OR = 60 mL/min/1.52m2 83  AG Ratio 1.0 - 2.5 (calc) 1.6  AST 10 - 35 U/L 19  ALT 6 - 29 U/L 20  Total Protein 6.1 - 8.1 g/dL 7.0  Total Bilirubin 0.2 - 1.2 mg/dL 0.5    ASSESSMENT / PLAN / RECOMMENDATIONS:   1) Type 2 Diabetes Mellitus, Optimally controlled, Without complications - Most recent A1c of 6.1%. Goal A1c < 7.0 %.    - Praised pt on improved glycemia control  - She is on higher dose of insulin than previously prescribed, will reduce  as below  - Discussed risk of hypoglycemia with insulin  - Dexcom is cost prohibitive ~ $250 a month -Intolerant to invokana due to recurrent genital infections   MEDICATIONS: Continue Ozempic 2 mg weekly Decrease Lantus 56 units daily  EDUCATION / INSTRUCTIONS: BG monitoring instructions: Patient is instructed to check her blood sugars 1 times a day, fast thing. Call [facility] if: BG persistently < 70  I reviewed the Rule of 15 for the treatment of hypoglycemia in detail with the patient. Literature supplied.   2) Diabetic complications:  Eye: Does not have known diabetic retinopathy.  Neuro/ Feet: Does not have known diabetic peripheral neuropathy .  Renal: Patient does not have known baseline CKD. She   is  on an ACEI/ARB at present.     F/U in 6 months   Signed electronically by: Mack Guise, MD  [facility]  [facility] [address] [place], [place] [address] Phone: [phone] FAX: [phone]   CC: Laurell Roof [address] [place] [address] Phone: [phone]  Fax: [phone]  Return to Endocrinology clinic as below: Future Appointments  Date Time Provider [place]  05/10/2022  1:40 PM Delsa Grana, PA-C [place] [facility]

## 2022-04-27 ENCOUNTER — Ambulatory Visit: Payer: 59 | Admitting: Internal Medicine

## 2022-04-27 VITALS — BP 124/76 | HR 56 | Ht 62.0 in | Wt 209.0 lb

## 2022-04-27 DIAGNOSIS — E1165 Type 2 diabetes mellitus with hyperglycemia: Secondary | ICD-10-CM

## 2022-04-27 DIAGNOSIS — Z794 Long term (current) use of insulin: Secondary | ICD-10-CM

## 2022-04-27 DIAGNOSIS — E119 Type 2 diabetes mellitus without complications: Secondary | ICD-10-CM

## 2022-04-27 LAB — POCT GLYCOSYLATED HEMOGLOBIN (HGB A1C): Hemoglobin A1C: 6.1 % — AB (ref 4.0–5.6)

## 2022-04-27 MED ORDER — INSULIN PEN NEEDLE 31G X 5 MM MISC: 1.0000 | Freq: Every day | 3 refills | Status: DC

## 2022-04-27 MED ORDER — SEMAGLUTIDE (2 MG/DOSE) 8 MG/3ML ~~LOC~~ SOPN: 2.0000 mg | PEN_INJECTOR | SUBCUTANEOUS | 3 refills | Status: DC

## 2022-04-27 MED ORDER — LANTUS SOLOSTAR 100 UNIT/ML ~~LOC~~ SOPN: 56.0000 [IU] | PEN_INJECTOR | SUBCUTANEOUS | 3 refills | Status: DC

## 2022-04-27 NOTE — Patient Instructions (Signed)
Continue  Ozempic 2 mg weekly  Decrease  Lantus 56 units daily     HOW TO TREAT LOW BLOOD SUGARS (Blood sugar LESS THAN 70 MG/DL) Please follow the RULE OF 15 for the treatment of hypoglycemia treatment (when your (blood sugars are less than 70 mg/dL)   STEP 1: Take 15 grams of carbohydrates when your blood sugar is low, which includes:  3-4 GLUCOSE TABS  OR 3-4 OZ OF JUICE OR REGULAR SODA OR ONE TUBE OF GLUCOSE GEL    STEP 2: RECHECK blood sugar in 15 MINUTES STEP 3: If your blood sugar is still low at the 15 minute recheck --> then, go back to STEP 1 and treat AGAIN with another 15 grams of carbohydrates.

## 2022-05-10 ENCOUNTER — Ambulatory Visit: Payer: 59 | Admitting: Physician Assistant

## 2022-05-11 ENCOUNTER — Ambulatory Visit (INDEPENDENT_AMBULATORY_CARE_PROVIDER_SITE_OTHER): Payer: 59 | Admitting: Family Medicine

## 2022-05-11 ENCOUNTER — Encounter: Payer: Self-pay | Admitting: Family Medicine

## 2022-05-11 VITALS — BP 130/72 | HR 65 | Temp 97.6°F | Resp 16 | Ht 62.0 in | Wt 208.9 lb

## 2022-05-11 DIAGNOSIS — I491 Atrial premature depolarization: Secondary | ICD-10-CM

## 2022-05-11 DIAGNOSIS — F330 Major depressive disorder, recurrent, mild: Secondary | ICD-10-CM

## 2022-05-11 DIAGNOSIS — F411 Generalized anxiety disorder: Secondary | ICD-10-CM

## 2022-05-11 DIAGNOSIS — E119 Type 2 diabetes mellitus without complications: Secondary | ICD-10-CM | POA: Diagnosis not present

## 2022-05-11 DIAGNOSIS — I10 Essential (primary) hypertension: Secondary | ICD-10-CM | POA: Diagnosis not present

## 2022-05-11 DIAGNOSIS — G4700 Insomnia, unspecified: Secondary | ICD-10-CM

## 2022-05-11 DIAGNOSIS — E66812 Obesity, class 2: Secondary | ICD-10-CM

## 2022-05-11 DIAGNOSIS — Z6838 Body mass index (BMI) 38.0-38.9, adult: Secondary | ICD-10-CM

## 2022-05-11 DIAGNOSIS — M545 Low back pain, unspecified: Secondary | ICD-10-CM

## 2022-05-11 DIAGNOSIS — Z1231 Encounter for screening mammogram for malignant neoplasm of breast: Secondary | ICD-10-CM | POA: Diagnosis not present

## 2022-05-11 DIAGNOSIS — G8929 Other chronic pain: Secondary | ICD-10-CM

## 2022-05-11 DIAGNOSIS — Z794 Long term (current) use of insulin: Secondary | ICD-10-CM

## 2022-05-11 DIAGNOSIS — F410 Panic disorder [episodic paroxysmal anxiety] without agoraphobia: Secondary | ICD-10-CM

## 2022-05-11 DIAGNOSIS — E785 Hyperlipidemia, unspecified: Secondary | ICD-10-CM

## 2022-05-11 DIAGNOSIS — F419 Anxiety disorder, unspecified: Secondary | ICD-10-CM

## 2022-05-11 DIAGNOSIS — E1169 Type 2 diabetes mellitus with other specified complication: Secondary | ICD-10-CM

## 2022-05-11 NOTE — Assessment & Plan Note (Signed)
Per endocrinology, A1c is well-controlled and she has been able to decrease the dosing of her basal insulin She continues to lose weight and follow with the specialist They recently did diabetic foot exam however she is due for diabetic eye exam and urine microalbumin/creatinine ratio

## 2022-05-11 NOTE — Assessment & Plan Note (Signed)
Using trazodone infrequently

## 2022-05-11 NOTE — Assessment & Plan Note (Signed)
Cardiac exam normal today, RRR Pt denies any recent bothersome sx, currently managed by cardiology and Toprol-XL 25 mg daily

## 2022-05-11 NOTE — Assessment & Plan Note (Signed)
PHQ-9 reviewed and negative today, continue Prozac 20 mg daily

## 2022-05-11 NOTE — Assessment & Plan Note (Signed)
She continues to work on diet/exercise, endo switched GLP-1 from trulicity to ozempic Over the last couple years she's lost almost 40 lbs

## 2022-05-11 NOTE — Assessment & Plan Note (Signed)
BP adequately controlled today - initially elevated but the pt is anxious due to something the happened just before the appt today Repeated and improved Continue meds and DASH BP Readings from Last 3 Encounters:  05/11/22 130/72  04/27/22 124/76  02/01/22 128/72

## 2022-05-11 NOTE — Assessment & Plan Note (Signed)
See above (anxiety)

## 2022-05-11 NOTE — Assessment & Plan Note (Signed)
Overall anxiety is well-controlled with the use of Prozac 20 mg daily she would like to have a few pills of the Ativan available for severe anxiety episodes or flareups Controlled substance database reviewed No refills for over a year Patient previously was using regularly for anxiety and for insomnia I explained that I can provide refills for about 10 pills to last 6 months, should be reserved for severe anxiety episodes/anxiety attacks/panic attacks

## 2022-05-11 NOTE — Assessment & Plan Note (Signed)
She continues to have chronic low back pain despite losing almost 40 pounds

## 2022-05-11 NOTE — Progress Notes (Signed)
Name: Alicia Pope   MRN: [id number]    DOB: 1961/02/12   Date:05/11/2022       Progress Note  Chief Complaint  Patient presents with   Follow-up    Subjective:   Alicia Pope is a 62 y.o. female, presents to clinic for routine f/up  IDDM seeing endo lantus 58 units daily Ozempic 2 mg weekly Reviewed last OV note and labs, DM foot exam was done and A1C 6.1, down from 7.4 She is on statin and ACEI, due for urine microalbumin/cr ratio and DM eye exam Changed from trulicity to ozempic, no GI SE or concerns at this time and she is gradually losing weight - sig weight/BMI changes over the past 3+ years  Wt Readings from Last 5 Encounters:  05/11/22 208 lb 14.4 oz (94.8 kg)  04/27/22 209 lb (94.8 kg)  02/01/22 213 lb 11.2 oz (96.9 kg)  12/04/21 217 lb (98.4 kg)  11/05/21 217 lb 11.2 oz (98.7 kg)   BMI Readings from Last 5 Encounters:  05/11/22 38.21 kg/m  04/27/22 38.23 kg/m  02/01/22 39.09 kg/m  12/04/21 39.69 kg/m  11/05/21 39.82 kg/m    Hypertension:  Currently managed on lisinopril HCTZ and metoprolol for palpitations/PAC per cardiology Pt reports good med compliance and denies any SE.   Blood pressure today is well controlled.  She denies any recent palpitation symptoms, near syncope, lightheaded episodes, dyspnea BP Readings from Last 3 Encounters:  05/11/22 130/72  04/27/22 124/76  02/01/22 128/72   Pt denies CP, SOB, exertional sx, LE edema, palpitation, Ha's, visual disturbances, lightheadedness, hypotension, syncope. Dietary efforts for BP?  Healthy  Hyperlipidemia she endorses compliance with statin without any new side effects or concerns her labs were done about 6 months ago and LDL was well-controlled   Anxiety and insomnia she has continued to take Prozac and will use trazodone as needed no Ativan refills for over a year She would like her refill with a small amount for severe anxiety symptoms/episodes Controlled substance database reviewed  today    05/11/2022    1:54 PM 02/01/2022    2:33 PM 11/05/2021    1:41 PM  Depression screen PHQ 2/9  Decreased Interest 0 0 0  Down, Depressed, Hopeless 0 0 0  PHQ - 2 Score 0 0 0  Altered sleeping 0 0 3  Tired, decreased energy 0 0 0  Change in appetite 0 0 0  Feeling bad or failure about yourself  0 0 1  Trouble concentrating 0 0 1  Moving slowly or fidgety/restless 0 0 0  Suicidal thoughts 0 0 0  PHQ-9 Score 0 0 5  Difficult doing work/chores Not difficult at all Not difficult at all Not difficult at all      11/05/2021    1:43 PM 02/05/2021    9:08 AM 08/04/2020    9:32 AM 05/01/2020    1:33 PM  GAD 7 : Generalized Anxiety Score  Nervous, Anxious, on Edge 1 1 1 2   Control/stop worrying 1 0 1 2  Worry too much - different things 1 0 1 2  Trouble relaxing 1 0 1 2  Restless 1 0 1 2  Easily annoyed or irritable 1 1 2 2   Afraid - awful might happen 1 0 1 2  Total GAD 7 Score 7 2 8 14   Anxiety Difficulty Not difficult at all Not difficult at all Not difficult at all Not difficult at all  Current Outpatient Medications:    albuterol (VENTOLIN HFA) 108 (90 Base) MCG/ACT inhaler, INHALE 2 PUFFS INTO THE LUNGS EVERY 4 HOURS AS NEEDED FOR WHEEZING OR SHORTNESS OF BREATH., Disp: 8.5 each, Rfl: 2   aspirin 81 MG tablet, Take 1 tablet (81 mg total) by mouth daily., Disp: 30 tablet, Rfl: 11   [employer] D3 2000 units CAPS, Take 2 capsules (4,000 Units total) by mouth daily., Disp: 30 each, Rfl: 3   FLUoxetine (PROZAC) 20 MG capsule, Take 1 capsule (20 mg total) by mouth daily., Disp: 90 capsule, Rfl: 3   guaiFENesin (MUCINEX) 600 MG 12 hr tablet, Take 1 tablet (600 mg total) by mouth 2 (two) times daily., Disp: 60 tablet, Rfl: 0   insulin glargine (LANTUS SOLOSTAR) 100 UNIT/ML Solostar Pen, Inject 56 Units into the skin every morning., Disp: 60 mL, Rfl: 3   Insulin Pen Needle 31G X 5 MM MISC, 1 Device by Does not apply route daily in the afternoon., Disp: 100 each, Rfl: 3    lisinopril-hydrochlorothiazide (ZESTORETIC) 20-12.5 MG tablet, Take 1 tablet by mouth daily., Disp: 90 tablet, Rfl: 3   metoprolol succinate (TOPROL-XL) 25 MG 24 hr tablet, Take 1 tablet (25 mg total) by mouth daily., Disp: 90 tablet, Rfl: 3   pantoprazole (PROTONIX) 40 MG tablet, Take 1 tablet (40 mg total) by mouth daily., Disp: 90 tablet, Rfl: 3   predniSONE (DELTASONE) 20 MG tablet, 2 tabs poqday 1-3, 1 tabs poqday 4-6, Disp: 9 tablet, Rfl: 0   rosuvastatin (CRESTOR) 40 MG tablet, Take 1 tablet (40 mg total) by mouth daily., Disp: 90 tablet, Rfl: 3   Semaglutide, 2 MG/DOSE, 8 MG/3ML SOPN, Inject 2 mg as directed once a week., Disp: 9 mL, Rfl: 3   tiZANidine (ZANAFLEX) 4 MG tablet, SMARTSIG:3 Tablet(s) By Mouth Every Other Day PRN, Disp: , Rfl:    traZODone (DESYREL) 50 MG tablet, TAKE 1/2 UP TO 2 TABLETS BY MOUTH AT BEDTIME AS NEEDED FOR SLEEP, Disp: 180 tablet, Rfl: 2   benzonatate (TESSALON) 100 MG capsule, Take 1 capsule (100 mg total) by mouth 3 (three) times daily as needed for cough. (Patient not taking: Reported on 05/11/2022), Disp: 30 capsule, Rfl: 0   LORazepam (ATIVAN) 1 MG tablet, TAKE 1/2 TABLET TO 1 TABLET UP TO TWICE A DAY AS NEEDED SPARINGLY FOR ANXIETY OR SEVERE INSOMNIA (Patient not taking: Reported on 05/11/2022), Disp: 30 tablet, Rfl: 0  Patient Active Problem List   Diagnosis Date Noted   Mild episode of recurrent major depressive disorder (HCC) 11/05/2021   Generalized anxiety disorder with panic attacks 11/05/2021   Chronic low back pain 08/04/2020   High risk medication use 05/01/2020   Aortic valve sclerosis 05/01/2020   PAC (premature atrial contraction) 04/24/2019   Sinus bradycardia, persistent 04/24/2019   Exertional dyspnea 04/24/2019   Systolic ejection murmur 02/20/2019   Diabetes mellitus, type II, insulin dependent (HCC) 04/29/2016   Insomnia 04/02/2015   Class 2 severe obesity with serious comorbidity and body mass index (BMI) of 38.0 to 38.9 in adult (HCC)  09/23/2014   Vitamin D deficiency 09/23/2014   Screening for colorectal cancer 09/23/2014   Anxiety    Depression    Gastroesophageal reflux disease    Essential hypertension    Hyperlipidemia associated with type 2 diabetes mellitus (HCC)     Past Surgical History:  Procedure Laterality Date   ANTERIOR CRUCIATE LIGAMENT REPAIR  1997   APPENDECTOMY  1987   CARDIAC EVENT MONITOR  03/2019  No average rate 60 bpm.  Max HR 122, minimum HR 36 bpm-sinus bradycardia with PACs and bigeminy.  Frequent PACs seen as isolated, couplets, triplets as well as frequent bigeminy.:   CESAREAN SECTION     [phone]   CHOLECYSTECTOMY  1987   NM MYOVIEW LTD  05/2019   Normal EF 60%.  Poor control hypertension.  (194/101 mmHg).  Small size moderate severity fixed defect in the mid anterior and apical anterior wall likely representing breast attenuation artifact.  No evidence of ischemia (reversibility).  LOW RISK   TRANSTHORACIC ECHOCARDIOGRAM  03/2019   EF 55-60 % with normal wall motion.  Slight LA dilation with grade 1 diastolic function.  Moderate aortic valve calcification-sclerosis without stenosis.  Otherwise normal.   uterine ablation      Family History  Problem Relation Age of Onset   Cancer Father        Lymphoma, Leukemia   Depression Father    Cancer Maternal Grandfather    Cancer Paternal Grandmother    Diabetes Paternal Grandfather    Kidney disease Paternal Grandfather     Social History   Tobacco Use   Smoking status: Never   Smokeless tobacco: Never  Substance Use Topics   Alcohol use: No   Drug use: No     Allergies  Allergen Reactions   Contrast Media [Iodinated Contrast Media] Anaphylaxis   Ambien [Zolpidem Tartrate] Other (See Comments)    Family has told her that after taking Ambien, she does "crazy things" that she does not remember doing.    Codeine Nausea And Vomiting    Health Maintenance  Topic Date Due   Diabetic kidney evaluation - Urine ACR  Never  done   PAP SMEAR-Modifier  11/21/2016   OPHTHALMOLOGY EXAM  10/04/2019   MAMMOGRAM  04/29/2022   INFLUENZA VACCINE  07/04/2022 (Originally 11/03/2021)   Zoster Vaccines- Shingrix (1 of 2) 08/09/2022 (Originally 06/08/1979)   HEMOGLOBIN A1C  10/26/2022   Diabetic kidney evaluation - eGFR measurement  11/06/2022   FOOT EXAM  12/05/2022   DTaP/Tdap/Td (2 - Td or Tdap) 11/06/2023   Fecal DNA (Cologuard)  12/10/2024   Hepatitis C Screening  Completed   HIV Screening  Completed   HPV VACCINES  Aged Out   COVID-19 Vaccine  Discontinued    Chart Review Today: I personally reviewed active problem list, medication list, allergies, family history, social history, health maintenance, notes from last encounter, lab results, imaging with the patient/caregiver today.   Review of Systems  Constitutional: Negative.   HENT: Negative.    Eyes: Negative.   Respiratory: Negative.    Cardiovascular: Negative.   Gastrointestinal: Negative.   Endocrine: Negative.   Genitourinary: Negative.   Musculoskeletal: Negative.   Skin: Negative.   Allergic/Immunologic: Negative.   Neurological: Negative.   Hematological: Negative.   Psychiatric/Behavioral: Negative.    All other systems reviewed and are negative.    Objective:   Vitals:   05/11/22 1359 05/11/22 1408  BP: (!) 144/82 130/72  Pulse: 65   Resp: 16   Temp: 97.6 F (36.4 C)   TempSrc: Oral   SpO2: 99%   Weight: 208 lb 14.4 oz (94.8 kg)   Height: 5\' 2"  (1.575 m)     Body mass index is 38.21 kg/m.  Physical Exam Vitals and nursing note reviewed.  Constitutional:      General: She is not in acute distress.    Appearance: Normal appearance. She is well-developed. She is obese. She is not  ill-appearing, toxic-appearing or diaphoretic.  HENT:     Head: Normocephalic and atraumatic.     Right Ear: External ear normal.     Left Ear: External ear normal.     Nose: Nose normal.  Eyes:     General: No scleral icterus.       Right eye:  No discharge.        Left eye: No discharge.     Conjunctiva/sclera: Conjunctivae normal.  Neck:     Trachea: No tracheal deviation.  Cardiovascular:     Rate and Rhythm: Normal rate and regular rhythm.     Pulses: Normal pulses.     Heart sounds: Normal heart sounds. No murmur heard.    No friction rub. No gallop.  Pulmonary:     Effort: Pulmonary effort is normal. No respiratory distress.     Breath sounds: Normal breath sounds. No stridor. No wheezing, rhonchi or rales.  Abdominal:     General: Bowel sounds are normal.     Palpations: Abdomen is soft.  Musculoskeletal:        General: Normal range of motion.  Skin:    General: Skin is warm and dry.     Findings: No rash.  Neurological:     Mental Status: She is alert. Mental status is at baseline.     Motor: No abnormal muscle tone.     Coordination: Coordination normal.  Psychiatric:        Attention and Perception: Attention normal.        Mood and Affect: Affect normal. Mood is anxious. Mood is not depressed.        Speech: Speech normal.        Behavior: Behavior normal. Behavior is cooperative.        Thought Content: Thought content normal.         Assessment & Plan:   Problem List Items Addressed This Visit       Cardiovascular and Mediastinum   Essential hypertension - Primary (Chronic)    BP adequately controlled today - initially elevated but the pt is anxious due to something the happened just before the appt today Repeated and improved Continue meds and DASH BP Readings from Last 3 Encounters:  05/11/22 130/72  04/27/22 124/76  02/01/22 128/72       Relevant Orders   COMPLETE METABOLIC PANEL WITH GFR   PAC (premature atrial contraction) (Chronic)    Cardiac exam normal today, RRR Pt denies any recent bothersome sx, currently managed by cardiology and Toprol-XL 25 mg daily        Endocrine   Diabetes mellitus, type II, insulin dependent (HCC) (Chronic)    Per endocrinology, A1c is  well-controlled and she has been able to decrease the dosing of her basal insulin She continues to lose weight and follow with the specialist They recently did diabetic foot exam however she is due for diabetic eye exam and urine microalbumin/creatinine ratio      Relevant Orders   Ambulatory referral to Ophthalmology   Microalbumin / creatinine urine ratio   COMPLETE METABOLIC PANEL WITH GFR   Hyperlipidemia associated with type 2 diabetes mellitus ([place])    Good compliance to statin, labs were done 6 months ago, continue Crestor 40 mg daily and continue diet and lifestyle efforts      Relevant Orders   COMPLETE METABOLIC PANEL WITH GFR     Other   Anxiety    Overall anxiety is well-controlled with the use of Prozac  20 mg daily she would like to have a few pills of the Ativan available for severe anxiety episodes or flareups Controlled substance database reviewed No refills for over a year Patient previously was using regularly for anxiety and for insomnia I explained that I can provide refills for about 10 pills to last 6 months, should be reserved for severe anxiety episodes/anxiety attacks/panic attacks      Class 2 severe obesity with serious comorbidity and body mass index (BMI) of 38.0 to 38.9 in adult [facility])    She continues to work on diet/exercise, endo switched GLP-1 from trulicity to ozempic Over the last couple years she's lost almost 40 lbs      Insomnia    Using trazodone infrequently      Chronic low back pain    She continues to have chronic low back pain despite losing almost 40 pounds      Mild episode of recurrent major depressive disorder (HCC)    PHQ-9 reviewed and negative today, continue Prozac 20 mg daily      Generalized anxiety disorder with panic attacks    See above (anxiety)       Other Visit Diagnoses     Encounter for screening mammogram for malignant neoplasm of breast       Relevant Orders   MM 3D SCREEN BREAST BILATERAL         Return in about 6 months (around 11/09/2022) for CPE.   Delsa Grana, PA-C 05/11/22 3:33 PM

## 2022-05-11 NOTE — Assessment & Plan Note (Signed)
Good compliance to statin, labs were done 6 months ago, continue Crestor 40 mg daily and continue diet and lifestyle efforts

## 2022-05-12 LAB — COMPLETE METABOLIC PANEL WITH GFR
AG Ratio: 1.7 (calc) (ref 1.0–2.5)
ALT: 17 U/L (ref 6–29)
AST: 19 U/L (ref 10–35)
Albumin: 4.3 g/dL (ref 3.6–5.1)
Alkaline phosphatase (APISO): 51 U/L (ref 37–153)
BUN: 9 mg/dL (ref 7–25)
CO2: 27 mmol/L (ref 20–32)
Calcium: 9.8 mg/dL (ref 8.6–10.4)
Chloride: 104 mmol/L (ref 98–110)
Creat: 0.74 mg/dL (ref 0.50–1.05)
Globulin: 2.6 g/dL (calc) (ref 1.9–3.7)
Glucose, Bld: 110 mg/dL — ABNORMAL HIGH (ref 65–99)
Potassium: 4.1 mmol/L (ref 3.5–5.3)
Sodium: 142 mmol/L (ref 135–146)
Total Bilirubin: 0.5 mg/dL (ref 0.2–1.2)
Total Protein: 6.9 g/dL (ref 6.1–8.1)
eGFR: 92 mL/min/{1.73_m2} (ref >=60)

## 2022-05-12 LAB — MICROALBUMIN / CREATININE URINE RATIO
Creatinine, Urine: 107 mg/dL (ref 20–275)
Microalb Creat Ratio: 5 mcg/mg creat (ref <30)
Microalb, Ur: 0.5 mg/dL

## 2022-05-12 MED ORDER — LORAZEPAM 1 MG PO TABS: 0.5000 mg | ORAL_TABLET | Freq: Every day | ORAL | 0 refills | Status: AC | PRN

## 2022-05-12 NOTE — Addendum Note (Signed)
Addended by: Delsa Grana on: 05/12/2022 03:04 PM   Modules accepted: Orders

## 2022-05-31 ENCOUNTER — Other Ambulatory Visit: Payer: Self-pay

## 2022-05-31 DIAGNOSIS — E1165 Type 2 diabetes mellitus with hyperglycemia: Secondary | ICD-10-CM

## 2022-06-23 ENCOUNTER — Other Ambulatory Visit: Payer: Self-pay | Admitting: Nurse Practitioner

## 2022-06-23 DIAGNOSIS — G4700 Insomnia, unspecified: Secondary | ICD-10-CM

## 2022-06-24 NOTE — Telephone Encounter (Signed)
Unable to refill per protocol, Rx request is too soon. Last refill  11/05/21 for 90 and 2 refills.  Requested Prescriptions  Pending Prescriptions Disp Refills   traZODone (DESYREL) 50 MG tablet [Pharmacy Med Name: TRAZODONE 50 MG TABLET] 180 tablet 2    Sig: TAKE 1/2 UP TO 2 TABLETS BY MOUTH AT BEDTIME AS NEEDED FOR SLEEP     Psychiatry: Antidepressants - Serotonin Modulator Passed - 06/23/2022  4:00 PM      Passed - Completed PHQ-2 or PHQ-9 in the last 360 days      Passed - Valid encounter within last 6 months    Recent Outpatient Visits           1 month ago Essential hypertension   [facility] Delsa Grana, PA-C   4 months ago Upper respiratory tract infection, unspecified type   [facility] Delsa Grana, PA-C   7 months ago Essential hypertension   [facility] Serafina Royals F, FNP   1 year ago Type 2 diabetes mellitus with hyperglycemia, without long-term current use of insulin [facility])   [facility] Delsa Grana, PA-C   1 year ago Type 2 diabetes mellitus with hyperglycemia, without long-term current use of insulin [facility])   [facility] Delsa Grana, PA-C       Future Appointments             In 4 months Delsa Grana, [facility], [facility]

## 2022-06-27 ENCOUNTER — Other Ambulatory Visit: Payer: Self-pay | Admitting: Family Medicine

## 2022-06-27 DIAGNOSIS — Z8701 Personal history of pneumonia (recurrent): Secondary | ICD-10-CM

## 2022-06-27 DIAGNOSIS — J40 Bronchitis, not specified as acute or chronic: Secondary | ICD-10-CM

## 2022-06-27 DIAGNOSIS — R0602 Shortness of breath: Secondary | ICD-10-CM

## 2022-06-27 DIAGNOSIS — J069 Acute upper respiratory infection, unspecified: Secondary | ICD-10-CM

## 2022-06-28 ENCOUNTER — Other Ambulatory Visit: Payer: Self-pay

## 2022-06-28 MED ORDER — SEMAGLUTIDE (2 MG/DOSE) 8 MG/3ML ~~LOC~~ SOPN: 2.0000 mg | PEN_INJECTOR | SUBCUTANEOUS | 0 refills | Status: DC

## 2022-06-28 NOTE — Telephone Encounter (Signed)
Patient called requesting a 3 month supply of ozemoic as it will be cheaper.  RX sent.

## 2022-07-13 ENCOUNTER — Other Ambulatory Visit: Payer: Self-pay | Admitting: Nurse Practitioner

## 2022-07-13 DIAGNOSIS — G4700 Insomnia, unspecified: Secondary | ICD-10-CM

## 2022-07-14 NOTE — Telephone Encounter (Signed)
Future visit in 3 months.  Requested Prescriptions  Pending Prescriptions Disp Refills   traZODone (DESYREL) 50 MG tablet [Pharmacy Med Name: TRAZODONE 50 MG TABLET] 180 tablet 0    Sig: TAKE 1/2 UP TO 2 TABLETS BY MOUTH AT BEDTIME AS NEEDED FOR SLEEP     Psychiatry: Antidepressants - Serotonin Modulator Passed - 07/13/2022  3:46 PM      Passed - Completed PHQ-2 or PHQ-9 in the last 360 days      Passed - Valid encounter within last 6 months    Recent Outpatient Visits           2 months ago Essential hypertension   [facility] [facility] Danelle Berry, PA-C   5 months ago Upper respiratory tract infection, unspecified type   [facility] Danelle Berry, PA-C   8 months ago Essential hypertension   [facility] Della Goo F, FNP   1 year ago Type 2 diabetes mellitus with hyperglycemia, without long-term current use of insulin [facility])   [place] [facility] Danelle Berry, PA-C   1 year ago Type 2 diabetes mellitus with hyperglycemia, without long-term current use of insulin [facility])   [place] [facility] Danelle Berry, PA-C       Future Appointments             In 3 months Danelle Berry, PA-C [facility], [facility]

## 2022-08-06 ENCOUNTER — Other Ambulatory Visit: Payer: Self-pay | Admitting: Nurse Practitioner

## 2022-08-06 DIAGNOSIS — I491 Atrial premature depolarization: Secondary | ICD-10-CM

## 2022-08-09 NOTE — Telephone Encounter (Signed)
Requested Prescriptions  Pending Prescriptions Disp Refills   metoprolol succinate (TOPROL-XL) 25 MG 24 hr tablet [Pharmacy Med Name: METOPROLOL SUCC ER 25 MG TAB] 90 tablet 0    Sig: TAKE 1 TABLET (25 MG TOTAL) BY MOUTH DAILY.     Cardiovascular:  Beta Blockers Passed - 08/06/2022  6:48 PM      Passed - Last BP in normal range    BP Readings from Last 1 Encounters:  05/11/22 130/72         Passed - Last Heart Rate in normal range    Pulse Readings from Last 1 Encounters:  05/11/22 65         Passed - Valid encounter within last 6 months    Recent Outpatient Visits           3 months ago Essential hypertension   [place] [facility] Danelle Berry, PA-C   6 months ago Upper respiratory tract infection, unspecified type   [facility] Danelle Berry, PA-C   9 months ago Essential hypertension   [facility] Della Goo F, FNP   1 year ago Type 2 diabetes mellitus with hyperglycemia, without long-term current use of insulin [facility])   [place] [facility] Danelle Berry, PA-C   2 years ago Type 2 diabetes mellitus with hyperglycemia, without long-term current use of insulin [facility])   [place] [facility] Danelle Berry, PA-C       Future Appointments             In 3 months Danelle Berry, PA-C [facility], [facility]

## 2022-08-11 ENCOUNTER — Other Ambulatory Visit: Payer: Self-pay | Admitting: Nurse Practitioner

## 2022-08-11 DIAGNOSIS — I10 Essential (primary) hypertension: Secondary | ICD-10-CM

## 2022-08-11 NOTE — Telephone Encounter (Signed)
Last RF 11/05/21 #90 3 RF Requested Prescriptions  Refused Prescriptions Disp Refills   lisinopril-hydrochlorothiazide (ZESTORETIC) 20-12.5 MG tablet [Pharmacy Med Name: LISINOPRIL-HCTZ 20-12.5 MG TAB] 90 tablet 3    Sig: TAKE 1 TABLET BY MOUTH EVERY DAY     Cardiovascular:  ACEI + Diuretic Combos Passed - 08/11/2022  3:58 PM      Passed - Na in normal range and within 180 days    Sodium  Date Value Ref Range Status  05/11/2022 142 135 - 146 mmol/L Final         Passed - K in normal range and within 180 days    Potassium  Date Value Ref Range Status  05/11/2022 4.1 3.5 - 5.3 mmol/L Final         Passed - Cr in normal range and within 180 days    Creat  Date Value Ref Range Status  05/11/2022 0.74 0.50 - 1.05 mg/dL Final   Creatinine, Urine  Date Value Ref Range Status  05/11/2022 107 20 - 275 mg/dL Final         Passed - eGFR is 30 or above and within 180 days    GFR, Est African American  Date Value Ref Range Status  05/01/2020 101 > OR = 60 mL/min/1.51m2 Final   GFR, Est Non African American  Date Value Ref Range Status  05/01/2020 87 > OR = 60 mL/min/1.88m2 Final   eGFR  Date Value Ref Range Status  05/11/2022 92 > OR = 60 mL/min/1.16m2 Final         Passed - Patient is not pregnant      Passed - Last BP in normal range    BP Readings from Last 1 Encounters:  05/11/22 130/72         Passed - Valid encounter within last 6 months    Recent Outpatient Visits           3 months ago Essential hypertension   [facility] Danelle Berry, PA-C   6 months ago Upper respiratory tract infection, unspecified type   [facility] Danelle Berry, PA-C   9 months ago Essential hypertension   [facility] Della Goo F, FNP   1 year ago Type 2 diabetes mellitus with hyperglycemia, without long-term current use of insulin [facility])   [facility] [facility] Danelle Berry, PA-C   2 years  ago Type 2 diabetes mellitus with hyperglycemia, without long-term current use of insulin [facility])    [facility] Danelle Berry, PA-C       Future Appointments             In 3 months Danelle Berry, PA-C [facility], [facility]

## 2022-08-15 ENCOUNTER — Other Ambulatory Visit: Payer: Self-pay | Admitting: Nurse Practitioner

## 2022-08-15 DIAGNOSIS — I10 Essential (primary) hypertension: Secondary | ICD-10-CM

## 2022-08-16 NOTE — Telephone Encounter (Signed)
Duplicate request- too soon Rx 11/3821- 1 year supply Requested Prescriptions  Pending Prescriptions Disp Refills   lisinopril-hydrochlorothiazide (ZESTORETIC) 20-12.5 MG tablet [Pharmacy Med Name: LISINOPRIL-HCTZ 20-12.5 MG TAB] 90 tablet 3    Sig: TAKE 1 TABLET BY MOUTH EVERY DAY     Cardiovascular:  ACEI + Diuretic Combos Passed - 08/15/2022 12:37 AM      Passed - Na in normal range and within 180 days    Sodium  Date Value Ref Range Status  05/11/2022 142 135 - 146 mmol/L Final         Passed - K in normal range and within 180 days    Potassium  Date Value Ref Range Status  05/11/2022 4.1 3.5 - 5.3 mmol/L Final         Passed - Cr in normal range and within 180 days    Creat  Date Value Ref Range Status  05/11/2022 0.74 0.50 - 1.05 mg/dL Final   Creatinine, Urine  Date Value Ref Range Status  05/11/2022 107 20 - 275 mg/dL Final         Passed - eGFR is 30 or above and within 180 days    GFR, Est African American  Date Value Ref Range Status  05/01/2020 101 > OR = 60 mL/min/1.69m2 Final   GFR, Est Non African American  Date Value Ref Range Status  05/01/2020 87 > OR = 60 mL/min/1.48m2 Final   eGFR  Date Value Ref Range Status  05/11/2022 92 > OR = 60 mL/min/1.66m2 Final         Passed - Patient is not pregnant      Passed - Last BP in normal range    BP Readings from Last 1 Encounters:  05/11/22 130/72         Passed - Valid encounter within last 6 months    Recent Outpatient Visits           3 months ago Essential hypertension   [place] [facility] Danelle Berry, PA-C   6 months ago Upper respiratory tract infection, unspecified type   [facility] Danelle Berry, PA-C   9 months ago Essential hypertension   [facility] Della Goo F, FNP   1 year ago Type 2 diabetes mellitus with hyperglycemia, without long-term current use of insulin [facility])   [place] [facility]  Danelle Berry, PA-C   2 years ago Type 2 diabetes mellitus with hyperglycemia, without long-term current use of insulin [facility])   [place] [facility] Danelle Berry, PA-C       Future Appointments             In 2 months Danelle Berry, PA-C [facility], [facility]

## 2022-08-26 ENCOUNTER — Other Ambulatory Visit: Payer: Self-pay | Admitting: Nurse Practitioner

## 2022-08-26 DIAGNOSIS — G4700 Insomnia, unspecified: Secondary | ICD-10-CM

## 2022-08-26 NOTE — Telephone Encounter (Signed)
Rx 07/14/22 #180- too soon Requested Prescriptions  Pending Prescriptions Disp Refills   traZODone (DESYREL) 50 MG tablet [Pharmacy Med Name: TRAZODONE 50 MG TABLET] 180 tablet 0    Sig: TAKE 1/2 UP TO 2 TABLETS BY MOUTH AT BEDTIME AS NEEDED FOR SLEEP     Psychiatry: Antidepressants - Serotonin Modulator Passed - 08/26/2022 12:41 PM      Passed - Completed PHQ-2 or PHQ-9 in the last 360 days      Passed - Valid encounter within last 6 months    Recent Outpatient Visits           3 months ago Essential hypertension   [facility] Danelle Berry, PA-C   6 months ago Upper respiratory tract infection, unspecified type   [facility] Dan[facility] Berry, PA-C   9 months ago Essential hypertension   [facility] Della Goo F, FNP   1 year ago Type 2 diabetes mellitus with hyperglycemia, without long-term current use of insulin [facility])   [place] [facility] Danelle Berry, PA-C   2 years ago Type 2 diabetes mellitus with hyperglycemia, without long-term current use of insulin [facility])   [facility] Danelle Berry, PA-C       Future Appointments             In 2 months Danelle Berry, PA-C [facility], [facility]

## 2022-08-31 ENCOUNTER — Other Ambulatory Visit: Payer: Self-pay | Admitting: Nurse Practitioner

## 2022-08-31 ENCOUNTER — Other Ambulatory Visit: Payer: Self-pay | Admitting: Family Medicine

## 2022-08-31 DIAGNOSIS — Z8701 Personal history of pneumonia (recurrent): Secondary | ICD-10-CM

## 2022-08-31 DIAGNOSIS — I10 Essential (primary) hypertension: Secondary | ICD-10-CM

## 2022-08-31 DIAGNOSIS — R0602 Shortness of breath: Secondary | ICD-10-CM

## 2022-08-31 DIAGNOSIS — G4700 Insomnia, unspecified: Secondary | ICD-10-CM

## 2022-08-31 DIAGNOSIS — J40 Bronchitis, not specified as acute or chronic: Secondary | ICD-10-CM

## 2022-08-31 DIAGNOSIS — J069 Acute upper respiratory infection, unspecified: Secondary | ICD-10-CM

## 2022-09-01 NOTE — Telephone Encounter (Signed)
Requested Prescriptions  Pending Prescriptions Disp Refills   lisinopril-hydrochlorothiazide (ZESTORETIC) 20-12.5 MG tablet [Pharmacy Med Name: LISINOPRIL-HCTZ 20-12.5 MG TAB] 90 tablet 0    Sig: TAKE 1 TABLET BY MOUTH EVERY DAY     Cardiovascular:  ACEI + Diuretic Combos Passed - 08/31/2022  3:22 PM      Passed - Na in normal range and within 180 days    Sodium  Date Value Ref Range Status  05/11/2022 142 135 - 146 mmol/L Final         Passed - K in normal range and within 180 days    Potassium  Date Value Ref Range Status  05/11/2022 4.1 3.5 - 5.3 mmol/L Final         Passed - Cr in normal range and within 180 days    Creat  Date Value Ref Range Status  05/11/2022 0.74 0.50 - 1.05 mg/dL Final   Creatinine, Urine  Date Value Ref Range Status  05/11/2022 107 20 - 275 mg/dL Final         Passed - eGFR is 30 or above and within 180 days    GFR, Est African American  Date Value Ref Range Status  05/01/2020 101 > OR = 60 mL/min/1.82m2 Final   GFR, Est Non African American  Date Value Ref Range Status  05/01/2020 87 > OR = 60 mL/min/1.77m2 Final   eGFR  Date Value Ref Range Status  05/11/2022 92 > OR = 60 mL/min/1.29m2 Final         Passed - Patient is not pregnant      Passed - Last BP in normal range    BP Readings from Last 1 Encounters:  05/11/22 130/72         Passed - Valid encounter within last 6 months    Recent Outpatient Visits           3 months ago Essential hypertension   [facility] Dan[facility] Berry, [facility]-C   7 months ago Upper respiratory tract infection, unspecified type   [facility] Berry, [facility]-C   10 months ago Essential hypertension   [facility] Della Goo F, FNP   1 year ago Type 2 diabetes mellitus with hyperglycemia, without long-term current use of insulin [facility])   [place] [facility] Danelle Berry, PA-C   2 years ago Type 2 diabetes  mellitus with hyperglycemia, without long-term current use of insulin [facility])   [place] [facility] Danelle Berry, PA-C       Future Appointments             In 2 months Danelle Berry, PA-C [place] [facility]             traZODone (DESYREL) 50 MG tablet [Pharmacy Med Name: TRAZODONE 50 MG TABLET] 180 tablet 0    Sig: TAKE 1/2 UP TO 2 TABLETS BY MOUTH AT BEDTIME AS NEEDED FOR SLEEP     Psychiatry: Antidepressants - Serotonin Modulator Passed - 08/31/2022  3:22 PM      Passed - Completed PHQ-2 or PHQ-9 in the last 360 days      Passed - Valid encounter within last 6 months    Recent Outpatient Visits           3 months ago Essential hypertension   [place] [facility] [place], Sheliah Mends, PA-C   7 months ago Upper respiratory tract infection, unspecified type  [facility] Berry, [facility]-C   10 months ago Essential hypertension   [facility] Della [facility]oo F, FNP   1 year ago Type 2 diabetes mellitus with hyperglycemia, without long-term current use of insulin [facility])   [place] [facility] Danelle Berry, PA-C   2 years ago Type 2 diabetes mellitus with hyperglycemia, without long-term current use of insulin [facility])    [facility] Danelle Berry, PA-C       Future Appointments             In 2 months Danelle Berry, PA-C [facility], [facility]

## 2022-09-21 ENCOUNTER — Other Ambulatory Visit: Payer: Self-pay

## 2022-09-21 MED ORDER — SEMAGLUTIDE (2 MG/DOSE) 8 MG/3ML ~~LOC~~ SOPN: 2.0000 mg | PEN_INJECTOR | SUBCUTANEOUS | 0 refills | Status: DC

## 2022-10-02 ENCOUNTER — Other Ambulatory Visit: Payer: Self-pay | Admitting: Nurse Practitioner

## 2022-10-02 DIAGNOSIS — F330 Major depressive disorder, recurrent, mild: Secondary | ICD-10-CM

## 2022-10-04 NOTE — Telephone Encounter (Signed)
Rx 12/15/21 #90 3RF- too soon Requested Prescriptions  Pending Prescriptions Disp Refills   FLUoxetine (PROZAC) 20 MG capsule [Pharmacy Med Name: FLUOXETINE HCL 20 MG CAPSULE] 90 capsule 3    Sig: TAKE 1 CAPSULE BY MOUTH EVERY DAY     Psychiatry:  Antidepressants - SSRI Passed - 10/02/2022 12:14 PM      Passed - Completed PHQ-2 or PHQ-9 in the last 360 days      Passed - Valid encounter within last 6 months    Recent Outpatient Visits           4 months ago Essential hypertension   [facility], [facility]-C   8 months ago Upper respiratory tract infection, unspecified type   [facility] Berry, PA-C   11 months ago Essential hypertension   [facility] Della Goo F, FNP   1 year ago Type 2 diabetes mellitus with hyperglycemia, without long-term current use of insulin [facility])   [place] [facility] Danelle Berry, PA-C   2 years ago Type 2 diabetes mellitus with hyperglycemia, without long-term current use of insulin [facility])   [place] Danelle Berry, PA-C       Future Appointments             In 1 month Danelle Berry, PA-C [facility], [facility]

## 2022-10-06 ENCOUNTER — Other Ambulatory Visit: Payer: Self-pay | Admitting: Family Medicine

## 2022-10-22 ENCOUNTER — Other Ambulatory Visit: Payer: Self-pay | Admitting: Nurse Practitioner

## 2022-10-22 DIAGNOSIS — K219 Gastro-esophageal reflux disease without esophagitis: Secondary | ICD-10-CM

## 2022-10-22 DIAGNOSIS — F330 Major depressive disorder, recurrent, mild: Secondary | ICD-10-CM

## 2022-10-22 NOTE — Telephone Encounter (Signed)
Requested Prescriptions  Pending Prescriptions Disp Refills   pantoprazole (PROTONIX) 40 MG tablet [Pharmacy Med Name: PANTOPRAZOLE SOD DR 40 MG TAB] 90 tablet 0    Sig: TAKE 1 TABLET BY MOUTH EVERY DAY     Gastroenterology: Proton Pump Inhibitors Passed - 10/22/2022 11:29 AM      Passed - Valid encounter within last 12 months    Recent Outpatient Visits           5 months ago Essential hypertension   [place], [facility]-C   8 months ago Upper respiratory tract infection, unspecified type   [facility], [facility]-C   11 months ago Essential hypertension   [facility] Della Goo F, FNP   1 year ago Type 2 diabetes mellitus with hyperglycemia, without long-term current use of insulin [facility])   [place] [facility] Danelle Berry, PA-C   2 years ago Type 2 diabetes mellitus with hyperglycemia, without long-term current use of insulin [facility])   [facility] Danelle Berry, PA-C       Future Appointments             In 2 weeks Danelle Berry, PA-C [facility]             FLUoxetine (PROZAC) 20 MG capsule [Pharmacy Med Name: FLUOXETINE HCL 20 MG CAPSULE] 90 capsule 0    Sig: TAKE 1 CAPSULE BY MOUTH EVERY DAY     Psychiatry:  Antidepressants - SSRI Passed - 10/22/2022 11:29 AM      Passed - Completed PHQ-2 or PHQ-9 in the last 360 days      Passed - Valid encounter within last 6 months    Recent Outpatient Visits           5 months ago Essential hypertension   [place] [facility] Danelle Berry, PA-C   8 months ago Upper respiratory tract infection, unspecified type   [facility], [facility]-C   11 months ago Essential hypertension   [facility] Della Goo F, FNP   1 year ago Type 2 diabetes mellitus with hyperglycemia, without  long-term current use of insulin [facility])   [place] [facility] Danelle Berry, PA-C   2 years ago Type 2 diabetes mellitus with hyperglycemia, without long-term current use of insulin [facility])   [place] [facility] Danelle Berry, PA-C       Future Appointments             In 2 weeks Danelle Berry, PA-C [facility], [facility]

## 2022-10-25 ENCOUNTER — Other Ambulatory Visit: Payer: Self-pay | Admitting: Family Medicine

## 2022-10-25 ENCOUNTER — Other Ambulatory Visit: Payer: Self-pay | Admitting: Nurse Practitioner

## 2022-10-25 DIAGNOSIS — I10 Essential (primary) hypertension: Secondary | ICD-10-CM

## 2022-10-25 DIAGNOSIS — I491 Atrial premature depolarization: Secondary | ICD-10-CM

## 2022-10-26 NOTE — Telephone Encounter (Signed)
Requested Prescriptions  Refused Prescriptions Disp Refills   lisinopril-hydrochlorothiazide (ZESTORETIC) 20-12.5 MG tablet [Pharmacy Med Name: LISINOPRIL-HCTZ 20-12.5 MG TAB] 90 tablet 0    Sig: TAKE 1 TABLET BY MOUTH EVERY DAY     Cardiovascular:  ACEI + Diuretic Combos Passed - 10/25/2022 12:58 PM      Passed - Na in normal range and within 180 days    Sodium  Date Value Ref Range Status  05/11/2022 142 135 - 146 mmol/L Final         Passed - K in normal range and within 180 days    Potassium  Date Value Ref Range Status  05/11/2022 4.1 3.5 - 5.3 mmol/L Final         Passed - Cr in normal range and within 180 days    Creat  Date Value Ref Range Status  05/11/2022 0.74 0.50 - 1.05 mg/dL Final   Creatinine, Urine  Date Value Ref Range Status  05/11/2022 107 20 - 275 mg/dL Final         Passed - eGFR is 30 or above and within 180 days    GFR, Est African American  Date Value Ref Range Status  05/01/2020 101 > OR = 60 mL/min/1.69m2 Final   GFR, Est Non African American  Date Value Ref Range Status  05/01/2020 87 > OR = 60 mL/min/1.106m2 Final   eGFR  Date Value Ref Range Status  05/11/2022 92 > OR = 60 mL/min/1.8m2 Final         Passed - Patient is not pregnant      Passed - Last BP in normal range    BP Readings from Last 1 Encounters:  05/11/22 130/72         Passed - Valid encounter within last 6 months    Recent Outpatient Visits           5 months ago Essential hypertension   [place] [facility] Danelle Berry, PA-C   8 months ago Upper respiratory tract infection, unspecified type   [facility], [facility]-C   11 months ago Essential hypertension   [facility] Della Goo F, FNP   1 year ago Type 2 diabetes mellitus with hyperglycemia, without long-term current use of insulin [facility])   [place] [facility] Dan[facility] Berry, PA-C   2 years ago Type 2 diabetes  mellitus with hyperglycemia, without long-term current use of insulin [facility])   [place] [facility] Danelle Berry, PA-C       Future Appointments             In 2 weeks Danelle Berry, PA-C [facility], [facility]

## 2022-11-01 ENCOUNTER — Ambulatory Visit: Payer: 59 | Admitting: Internal Medicine

## 2022-11-07 ENCOUNTER — Other Ambulatory Visit: Payer: Self-pay | Admitting: Family Medicine

## 2022-11-07 ENCOUNTER — Other Ambulatory Visit: Payer: Self-pay | Admitting: Nurse Practitioner

## 2022-11-07 DIAGNOSIS — E782 Mixed hyperlipidemia: Secondary | ICD-10-CM

## 2022-11-07 DIAGNOSIS — I10 Essential (primary) hypertension: Secondary | ICD-10-CM

## 2022-11-08 NOTE — Telephone Encounter (Signed)
Has not had lipid panel check within a year

## 2022-11-08 NOTE — Patient Instructions (Signed)
Preventive Care 40-62 Years Old, Female Preventive care refers to lifestyle choices and visits with your health care provider that can promote health and wellness. Preventive care visits are also called wellness exams. What can I expect for my preventive care visit? Counseling Your health care provider may ask you questions about your: Medical history, including: Past medical problems. Family medical history. Pregnancy history. Current health, including: Menstrual cycle. Method of birth control. Emotional well-being. Home life and relationship well-being. Sexual activity and sexual health. Lifestyle, including: Alcohol, nicotine or tobacco, and drug use. Access to firearms. Diet, exercise, and sleep habits. Work and work environment. Sunscreen use. Safety issues such as seatbelt and bike helmet use. Physical exam Your health care provider will check your: Height and weight. These may be used to calculate your BMI (body mass index). BMI is a measurement that tells if you are at a healthy weight. Waist circumference. This measures the distance around your waistline. This measurement also tells if you are at a healthy weight and may help predict your risk of certain diseases, such as type 2 diabetes and high blood pressure. Heart rate and blood pressure. Body temperature. Skin for abnormal spots. What immunizations do I need?  Vaccines are usually given at various ages, according to a schedule. Your health care provider will recommend vaccines for you based on your age, medical history, and lifestyle or other factors, such as travel or where you work. What tests do I need? Screening Your health care provider may recommend screening tests for certain conditions. This may include: Lipid and cholesterol levels. Diabetes screening. This is done by checking your blood sugar (glucose) after you have not eaten for a while (fasting). Pelvic exam and Pap test. Hepatitis B test. Hepatitis C  test. HIV (human immunodeficiency virus) test. STI (sexually transmitted infection) testing, if you are at risk. Lung cancer screening. Colorectal cancer screening. Mammogram. Talk with your health care provider about when you should start having regular mammograms. This may depend on whether you have a family history of breast cancer. BRCA-related cancer screening. This may be done if you have a family history of breast, ovarian, tubal, or peritoneal cancers. Bone density scan. This is done to screen for osteoporosis. Talk with your health care provider about your test results, treatment options, and if necessary, the need for more tests. Follow these instructions at home: Eating and drinking  Eat a diet that includes fresh fruits and vegetables, whole grains, lean protein, and low-fat dairy products. Take vitamin and mineral supplements as recommended by your health care provider. Do not drink alcohol if: Your health care provider tells you not to drink. You are pregnant, may be pregnant, or are planning to become pregnant. If you drink alcohol: Limit how much you have to 0-1 drink a day. Know how much alcohol is in your drink. In the [place] one drink equals one 12 oz bottle of beer (355 mL), one 5 oz glass of wine (148 mL), or one 1 oz glass of hard liquor (44 mL). Lifestyle Brush your teeth every morning and night with fluoride toothpaste. Floss one time each day. Exercise for at least 30 minutes 5 or more days each week. Do not use any products that contain nicotine or tobacco. These products include cigarettes, chewing tobacco, and vaping devices, such as e-cigarettes. If you need help quitting, ask your health care provider. Do not use drugs. If you are sexually active, practice safe sex. Use a condom or other form of protection to   prevent STIs. If you do not wish to become pregnant, use a form of birth control. If you plan to become pregnant, see your health care provider for a  prepregnancy visit. Take aspirin only as told by your health care provider. Make sure that you understand how much to take and what form to take. Work with your health care provider to find out whether it is safe and beneficial for you to take aspirin daily. Find healthy ways to manage stress, such as: Meditation, yoga, or listening to music. Journaling. Talking to a trusted person. Spending time with friends and family. Minimize exposure to UV radiation to reduce your risk of skin cancer. Safety Always wear your seat belt while driving or riding in a vehicle. Do not drive: If you have been drinking alcohol. Do not ride with someone who has been drinking. When you are tired or distracted. While texting. If you have been using any mind-altering substances or drugs. Wear a helmet and other protective equipment during sports activities. If you have firearms in your house, make sure you follow all gun safety procedures. Seek help if you have been physically or sexually abused. What's next? Visit your health care provider once a year for an annual wellness visit. Ask your health care provider how often you should have your eyes and teeth checked. Stay up to date on all vaccines. This information is not intended to replace advice given to you by your health care provider. Make sure you discuss any questions you have with your health care provider. Document Revised: 09/17/2020 Document Reviewed: 09/17/2020 Elsevier Patient Education  2024 Elsevier Inc.  

## 2022-11-08 NOTE — Telephone Encounter (Signed)
Requested Prescriptions  Pending Prescriptions Disp Refills   lisinopril-hydrochlorothiazide (ZESTORETIC) 20-12.5 MG tablet [Pharmacy Med Name: LISINOPRIL-HCTZ 20-12.5 MG TAB] 90 tablet 0    Sig: TAKE 1 TABLET BY MOUTH EVERY DAY     Cardiovascular:  ACEI + Diuretic Combos Passed - 11/07/2022  1:38 PM      Passed - Na in normal range and within 180 days    Sodium  Date Value Ref Range Status  05/11/2022 142 135 - 146 mmol/L Final         Passed - K in normal range and within 180 days    Potassium  Date Value Ref Range Status  05/11/2022 4.1 3.5 - 5.3 mmol/L Final         Passed - Cr in normal range and within 180 days    Creat  Date Value Ref Range Status  05/11/2022 0.74 0.50 - 1.05 mg/dL Final   Creatinine, Urine  Date Value Ref Range Status  05/11/2022 107 20 - 275 mg/dL Final         Passed - eGFR is 30 or above and within 180 days    GFR, Est African American  Date Value Ref Range Status  05/01/2020 101 > OR = 60 mL/min/1.86m2 Final   GFR, Est Non African American  Date Value Ref Range Status  05/01/2020 87 > OR = 60 mL/min/1.62m2 Final   eGFR  Date Value Ref Range Status  05/11/2022 92 > OR = 60 mL/min/1.9m2 Final         Passed - Patient is not pregnant      Passed - Last BP in normal range    BP Readings from Last 1 Encounters:  05/11/22 130/72         Passed - Valid encounter within last 6 months    Recent Outpatient Visits           6 months ago Essential hypertension   [facility] Dan[facility] Berry, [facility]-C   9 months ago Upper respiratory tract infection, unspecified type   [facility] Dan[facility], [facility]-C   1 year ago Essential hypertension   [facility] Della Goo F, FNP   1 year ago Type 2 diabetes mellitus with hyperglycemia, without long-term current use of insulin [facility])   [place] [facility] Danelle Berry, PA-C   2 years ago Type 2 diabetes mellitus  with hyperglycemia, without long-term current use of insulin [facility])   [place] [facility] Danelle Berry, [place]       Future Appointments             [place] Danelle Berry, PA-C [facility], [place]

## 2022-11-09 ENCOUNTER — Other Ambulatory Visit (HOSPITAL_COMMUNITY): Admission: RE | Admit: 2022-11-09 | Payer: 59 | Source: Ambulatory Visit

## 2022-11-09 ENCOUNTER — Ambulatory Visit (INDEPENDENT_AMBULATORY_CARE_PROVIDER_SITE_OTHER): Payer: 59 | Admitting: Family Medicine

## 2022-11-09 ENCOUNTER — Encounter: Payer: Self-pay | Admitting: Family Medicine

## 2022-11-09 VITALS — BP 132/82 | HR 62 | Temp 97.8°F | Resp 16 | Ht 62.75 in | Wt 204.0 lb

## 2022-11-09 DIAGNOSIS — Z124 Encounter for screening for malignant neoplasm of cervix: Secondary | ICD-10-CM

## 2022-11-09 DIAGNOSIS — E785 Hyperlipidemia, unspecified: Secondary | ICD-10-CM

## 2022-11-09 DIAGNOSIS — Z794 Long term (current) use of insulin: Secondary | ICD-10-CM

## 2022-11-09 DIAGNOSIS — Z0000 Encounter for general adult medical examination without abnormal findings: Secondary | ICD-10-CM | POA: Diagnosis not present

## 2022-11-09 DIAGNOSIS — E1169 Type 2 diabetes mellitus with other specified complication: Secondary | ICD-10-CM

## 2022-11-09 DIAGNOSIS — E119 Type 2 diabetes mellitus without complications: Secondary | ICD-10-CM

## 2022-11-09 LAB — CBC WITH DIFFERENTIAL/PLATELET
Absolute Monocytes: 494 cells/uL (ref 200–950)
Basophils Absolute: 52 cells/uL (ref 0–200)
Basophils Relative: 0.8 %
Eosinophils Absolute: 208 cells/uL (ref 15–500)
Eosinophils Relative: 3.2 %
HCT: 43.5 % (ref 35.0–45.0)
Hemoglobin: 14.5 g/dL (ref 11.7–15.5)
Lymphs Abs: 2197 cells/uL (ref 850–3900)
MCH: 29.2 pg (ref 27.0–33.0)
MCHC: 33.3 g/dL (ref 32.0–36.0)
MCV: 87.5 fL (ref 80.0–100.0)
MPV: 10.5 fL (ref 7.5–12.5)
Monocytes Relative: 7.6 %
Neutro Abs: 3549 cells/uL (ref 1500–7800)
Neutrophils Relative %: 54.6 %
Platelets: 231 10*3/uL (ref 140–400)
RBC: 4.97 10*6/uL (ref 3.80–5.10)
RDW: 13.1 % (ref 11.0–15.0)
Total Lymphocyte: 33.8 %
WBC: 6.5 10*3/uL (ref 3.8–10.8)

## 2022-11-09 NOTE — Progress Notes (Unsigned)
Patient: Alicia Pope, Female    DOB: [birth year], 62 y.o.   MRN: [id number] Danelle Berry, PA-C Visit Date: 11/09/2022  Today's Provider: Danelle Berry, PA-C   Chief Complaint  Patient presents with   Annual Exam   Subjective:   Annual physical exam:  Alicia Pope is a 62 y.o. female who presents today for complete physical exam:  Exercise/Activity:  5 d 30 min  Diet/nutrition:   healthy Sleep:  no concerns  SDOH Screenings   Food Insecurity: No Food Insecurity (11/09/2022)  Housing: Low Risk  (11/09/2022)  Transportation Needs: No Transportation Needs (11/09/2022)  Utilities: Not At Risk (11/09/2022)  Alcohol Screen: Low Risk  (11/09/2022)  Depression (PHQ2-9): Low Risk  (11/09/2022)  Financial Resource Strain: Low Risk  (11/09/2022)  Physical Activity: Sufficiently Active (11/09/2022)  Social Connections: Moderately Integrated (11/09/2022)  Stress: No Stress Concern Present (11/09/2022)  Tobacco Use: Low Risk  (11/09/2022)  Health Literacy: Adequate Health Literacy (11/09/2022)     USPSTF grade A and B recommendations - reviewed and addressed today  Depression:  Phq 9 completed today by patient, was reviewed by me with patient in the room PHQ score is neg, pt feels good    11/09/2022    1:44 PM 05/11/2022    1:54 PM 02/01/2022    2:33 PM 11/05/2021    1:41 PM  PHQ 2/9 Scores  PHQ - 2 Score 0 0 0 0  PHQ- 9 Score 0 0 0 5      11/09/2022    1:44 PM 05/11/2022    1:54 PM 02/01/2022    2:33 PM 11/05/2021    1:41 PM 02/05/2021    9:06 AM  Depression screen PHQ 2/9  Decreased Interest 0 0 0 0 1  Down, Depressed, Hopeless 0 0 0 0 0  PHQ - 2 Score 0 0 0 0 1  Altered sleeping 0 0 0 3 3  Tired, decreased energy 0 0 0 0 1  Change in appetite 0 0 0 0 0  Feeling bad or failure about yourself  0 0 0 1 0  Trouble concentrating 0 0 0 1 0  Moving slowly or fidgety/restless 0 0 0 0 0  Suicidal thoughts 0 0 0 0 0  PHQ-9 Score 0 0 0 5 5  Difficult doing work/chores Not difficult at all Not  difficult at all Not difficult at all Not difficult at all Not difficult at all    Alcohol screening: Flowsheet Row Office Visit from 11/09/2022 in [facility]  AUDIT-C Score 0       Immunizations and Health Maintenance: Health Maintenance  Topic Date Due   PAP SMEAR-Modifier  11/21/2016   OPHTHALMOLOGY EXAM  10/04/2019   MAMMOGRAM  04/29/2022   HEMOGLOBIN A1C  10/26/2022   INFLUENZA VACCINE  11/04/2022   Zoster Vaccines- Shingrix (1 of 2) 02/09/2023 (Originally 06/08/1979)   FOOT EXAM  12/05/2022   Diabetic kidney evaluation - eGFR measurement  05/12/2023   Diabetic kidney evaluation - Urine ACR  05/12/2023   DTaP/Tdap/Td (2 - Td or Tdap) 11/06/2023   Fecal DNA (Cologuard)  12/10/2024   Hepatitis C Screening  Completed   HIV Screening  Completed   HPV VACCINES  Aged Out   COVID-19 Vaccine  Discontinued     Hep C Screening: done  STD testing and prevention (HIV/chl/gon/syphilis):  see above, no additional testing desired by pt today None needed   Intimate partner violence:    safe  Sexual History/Pain during Intercourse:  Married  Menstrual History/LMP/Abnormal Bleeding:   No LMP recorded. Patient has had an ablation.  Incontinence Symptoms: stress incontinence   Breast cancer:  mammogram ordered - at [facility], overdue Last Mammogram: *see HM list above  Cervical cancer screening: due, last in 2015 - due  Osteoporosis:   Discussion on osteoporosis per age, including high calcium and vitamin D supplementation, weight bearing exercises Pt is not supplementing with daily calcium/Vit D. Roughly experienced menopause at age - 60's, in 77's after uterine ablation  Skin cancer:  Hx of skin CA -  NO  Discussed atypical lesions   Colorectal cancer:   Utd  Discussed concerning signs and sx of CRC, pt denies any concerning sx  Lung cancer:   Low Dose CT Chest recommended if Age 51-80 years, 20 pack-year currently smoking OR have quit w/in  15years. Patient does not qualify.    Social History   Tobacco Use   Smoking status: Never   Smokeless tobacco: Never  Substance Use Topics   Alcohol use: No   Drug use: No     Flowsheet Row Office Visit from 11/09/2022 in [facility]  AUDIT-C Score 0       Family History  Problem Relation Age of Onset   Cancer Father        Lymphoma, Leukemia   Depression Father    Cancer Maternal Grandfather    Cancer Paternal Grandmother    Diabetes Paternal Grandfather    Kidney disease Paternal Grandfather      Blood pressure/Hypertension: BP Readings from Last 3 Encounters:  11/09/22 132/82  05/11/22 130/72  04/27/22 124/76    Weight/Obesity: Wt Readings from Last 3 Encounters:  11/09/22 204 lb (92.5 kg)  05/11/22 208 lb 14.4 oz (94.8 kg)  04/27/22 209 lb (94.8 kg)   BMI Readings from Last 3 Encounters:  11/09/22 36.43 kg/m  05/11/22 38.21 kg/m  04/27/22 38.23 kg/m     Lipids:  Lab Results  Component Value Date   CHOL 187 11/05/2021   CHOL 181 05/01/2020   CHOL 171 06/28/2019   Lab Results  Component Value Date   HDL 58 11/05/2021   HDL 60 05/01/2020   HDL 53 06/28/2019   Lab Results  Component Value Date   LDLCALC 92 11/05/2021   LDLCALC 89 05/01/2020   LDLCALC 92 06/28/2019   Lab Results  Component Value Date   TRIG 281 (H) 11/05/2021   TRIG 229 (H) 05/01/2020   TRIG 166 (H) 06/28/2019   Lab Results  Component Value Date   CHOLHDL 3.2 11/05/2021   CHOLHDL 3.0 05/01/2020   CHOLHDL 3.2 06/28/2019   No results found for: "LDLDIRECT" Based on the results of lipid panel his/her cardiovascular risk factor ( using Poole Cohort )  in the next 10 years is: The 10-year ASCVD risk score (Arnett DK, et al., 2019) is: 10.1%   Values used to calculate the score:     Age: 43 years     Sex: Female     Is Non-Hispanic African American: No     Diabetic: Yes     Tobacco smoker: No     Systolic Blood Pressure: 132 mmHg     Is BP  treated: Yes     HDL Cholesterol: 58 mg/dL     Total Cholesterol: 187 mg/dL  Glucose:  Glucose, Bld  Date Value Ref Range Status  05/11/2022 110 (H) 65 - 99 mg/dL Final  Comment:    .            Fasting reference interval . For someone without known diabetes, a glucose value between 100 and 125 mg/dL is consistent with prediabetes and should be confirmed with a follow-up test. .   11/05/2021 186 (H) 65 - 99 mg/dL Final    Comment:    .            Fasting reference interval . For someone without known diabetes, a glucose value >125 mg/dL indicates that they may have diabetes and this should be confirmed with a follow-up test. .   05/01/2020 162 (H) 65 - 99 mg/dL Final    Comment:    .            Fasting reference interval . For someone without known diabetes, a glucose value >125 mg/dL indicates that they may have diabetes and this should be confirmed with a follow-up test. .    Glucose-Capillary  Date Value Ref Range Status  11/09/2018 116 (H) 70 - 99 mg/dL Final  09/81/1914 782 (H) 70 - 99 mg/dL Final  95/62/1308 657 (H) 70 - 99 mg/dL Final    Advanced Care Planning:  A voluntary discussion about advance care planning including the explanation and discussion of advance directives.     Social History       Social History   Socioeconomic History   Marital status: Married    Spouse name: Not on file   Number of children: Not on file   Years of education: Not on file   Highest education level: Not on file  Occupational History   Not on file  Tobacco Use   Smoking status: Never   Smokeless tobacco: Never  Vaping Use   Vaping status: Not on file  Substance and Sexual Activity   Alcohol use: No   Drug use: No   Sexual activity: Not Currently  Other Topics Concern   Not on file  Social History Narrative   Married.    Lives with husband and youngest son, who is 65 y/o.   Has 4 children.   Does not work outside of home.    No formal exercise.  Only activity around the house.   Tries to be careful with Diabetic diet.   Social Determinants of Health   Financial Resource Strain: Low Risk  (11/09/2022)   Overall Financial Resource Strain (CARDIA)    Difficulty of Paying Living Expenses: Not hard at all  Food Insecurity: No Food Insecurity (11/09/2022)   Hunger Vital Sign    Worried About Running Out of Food in the Last Year: Never true    Ran Out of Food in the Last Year: Never true  Transportation Needs: No Transportation Needs (11/09/2022)   PRAPARE - Administrator, Civil Service (Medical): No    Lack of Transportation (Non-Medical): No  Physical Activity: Sufficiently Active (11/09/2022)   Exercise Vital Sign    Days of Exercise per Week: 5 days    Minutes of Exercise per Session: 30 min  Stress: No Stress Concern Present (11/09/2022)   [employer] of Occupational Health - Occupational Stress Questionnaire    Feeling of Stress : Only a little  Social Connections: Moderately Integrated (11/09/2022)   Social Connection and Isolation Panel [NHANES]    Frequency of Communication with Friends and Family: More than three times a week    Frequency of Social Gatherings with Friends and Family: More than three times a  week    Attends Religious Services: More than 4 times per year    Active Member of Clubs or Organizations: No    Attends Banker Meetings: Never    Marital Status: Married    Family History        Family History  Problem Relation Age of Onset   Cancer Father        Lymphoma, Leukemia   Depression Father    Cancer Maternal Grandfather    Cancer Paternal Grandmother    Diabetes Paternal Grandfather    Kidney disease Paternal Grandfather     Patient Active Problem List   Diagnosis Date Noted   Mild episode of recurrent major depressive disorder (HCC) 11/05/2021   Generalized anxiety disorder with panic attacks 11/05/2021   Chronic low back pain 08/04/2020   High risk medication use  05/01/2020   Aortic valve sclerosis 05/01/2020   PAC (premature atrial contraction) 04/24/2019   Sinus bradycardia, persistent 04/24/2019   Exertional dyspnea 04/24/2019   Systolic ejection murmur 02/20/2019   Diabetes mellitus, type II, insulin dependent (HCC) 04/29/2016   Insomnia 04/02/2015   Class 2 severe obesity with serious comorbidity and body mass index (BMI) of 38.0 to 38.9 in adult (HCC) 09/23/2014   Vitamin D deficiency 09/23/2014   Screening for colorectal cancer 09/23/2014   Anxiety    Depression    Gastroesophageal reflux disease    Essential hypertension    Hyperlipidemia associated with type 2 diabetes mellitus (HCC)     Past Surgical History:  Procedure Laterality Date   ANTERIOR CRUCIATE LIGAMENT REPAIR  1997   APPENDECTOMY  1987   CARDIAC EVENT MONITOR  03/2019   No average rate 60 bpm.  Max HR 122, minimum HR 36 bpm-sinus bradycardia with PACs and bigeminy.  Frequent PACs seen as isolated, couplets, triplets as well as frequent bigeminy.:   CESAREAN SECTION     [phone]   CHOLECYSTECTOMY  1987   NM MYOVIEW LTD  05/2019   Normal EF 60%.  Poor control hypertension.  (194/101 mmHg).  Small size moderate severity fixed defect in the mid anterior and apical anterior wall likely representing breast attenuation artifact.  No evidence of ischemia (reversibility).  LOW RISK   TRANSTHORACIC ECHOCARDIOGRAM  03/2019   EF 55-60 % with normal wall motion.  Slight LA dilation with grade 1 diastolic function.  Moderate aortic valve calcification-sclerosis without stenosis.  Otherwise normal.   uterine ablation       Current Outpatient Medications:    albuterol (VENTOLIN HFA) 108 (90 Base) MCG/ACT inhaler, INHALE 2 PUFFS BY MOUTH EVERY 4 HOURS AS NEEDED FOR WHEEZE OR FOR SHORTNESS OF BREATH, Disp: 8.5 each, Rfl: 2   aspirin 81 MG tablet, Take 1 tablet (81 mg total) by mouth daily., Disp: 30 tablet, Rfl: 11   [employer] D3 2000 units CAPS, Take 2 capsules (4,000 Units total) by  mouth daily., Disp: 30 each, Rfl: 3   FLUoxetine (PROZAC) 20 MG capsule, TAKE 1 CAPSULE BY MOUTH EVERY DAY, Disp: 90 capsule, Rfl: 0   guaiFENesin (MUCINEX) 600 MG 12 hr tablet, Take 1 tablet (600 mg total) by mouth 2 (two) times daily., Disp: 60 tablet, Rfl: 0   insulin glargine (LANTUS SOLOSTAR) 100 UNIT/ML Solostar Pen, Inject 56 Units into the skin every morning., Disp: 60 mL, Rfl: 3   Insulin Pen Needle 31G X 5 MM MISC, 1 Device by Does not apply route daily in the afternoon., Disp: 100 each, Rfl: 3  lisinopril-hydrochlorothiazide (ZESTORETIC) 20-12.5 MG tablet, TAKE 1 TABLET BY MOUTH EVERY DAY, Disp: 90 tablet, Rfl: 0   LORazepam (ATIVAN) 1 MG tablet, Take 0.5-1 tablets (0.5-1 mg total) by mouth daily as needed for anxiety (sparingly for severe anxiety/panic attacks, Rx to last 6 months)., Disp: 20 tablet, Rfl: 0   metoprolol succinate (TOPROL-XL) 25 MG 24 hr tablet, TAKE 1 TABLET (25 MG TOTAL) BY MOUTH DAILY., Disp: 90 tablet, Rfl: 0   pantoprazole (PROTONIX) 40 MG tablet, TAKE 1 TABLET BY MOUTH EVERY DAY, Disp: 90 tablet, Rfl: 0   predniSONE (DELTASONE) 20 MG tablet, 2 tabs poqday 1-3, 1 tabs poqday 4-6, Disp: 9 tablet, Rfl: 0   rosuvastatin (CRESTOR) 40 MG tablet, Take 1 tablet (40 mg total) by mouth daily., Disp: 90 tablet, Rfl: 3   Semaglutide, 2 MG/DOSE, 8 MG/3ML SOPN, Inject 2 mg as directed once a week., Disp: 27 mL, Rfl: 0   tiZANidine (ZANAFLEX) 4 MG tablet, SMARTSIG:3 Tablet(s) By Mouth Every Other Day PRN, Disp: , Rfl:    traZODone (DESYREL) 50 MG tablet, TAKE 1/2 UP TO 2 TABLETS BY MOUTH AT BEDTIME AS NEEDED FOR SLEEP, Disp: 180 tablet, Rfl: 0  Allergies  Allergen Reactions   Contrast Media [Iodinated Contrast Media] Anaphylaxis   Ambien [Zolpidem Tartrate] Other (See Comments)    Family has told her that after taking Ambien, she does "crazy things" that she does not remember doing.    Codeine Nausea And Vomiting    Patient Care Team: Danelle Berry, PA-C as PCP - General  (Family Medicine) Marykay Lex, MD as PCP - Cardiology (Cardiology)   Chart Review: I personally reviewed active problem list, medication list, allergies, family history, social history, health maintenance, notes from last encounter, lab results, imaging with the patient/caregiver today.   Review of Systems  Constitutional: Negative.   HENT: Negative.    Eyes: Negative.   Respiratory: Negative.    Cardiovascular: Negative.   Gastrointestinal: Negative.   Endocrine: Negative.   Genitourinary: Negative.   Musculoskeletal: Negative.   Skin: Negative.   Allergic/Immunologic: Negative.   Neurological: Negative.   Hematological: Negative.   Psychiatric/Behavioral: Negative.    All other systems reviewed and are negative.         Objective:   Vitals:  Vitals:   11/09/22 1353  BP: 132/82  Pulse: 62  Resp: 16  Temp: 97.8 F (36.6 C)  TempSrc: Oral  SpO2: 96%  Weight: 204 lb (92.5 kg)  Height: 5' 2.75" (1.594 m)    Body mass index is 36.43 kg/m.  Physical Exam Vitals and nursing note reviewed. Exam conducted with a chaperone present.  Constitutional:      General: She is not in acute distress.    Appearance: Normal appearance. She is well-developed. She is obese. She is not ill-appearing, toxic-appearing or diaphoretic.     Interventions: Face mask in place.  HENT:     Head: Normocephalic and atraumatic.     Right Ear: External ear normal.     Left Ear: External ear normal.     Nose: Nose normal.     Mouth/Throat:     Mouth: Mucous membranes are moist.     Pharynx: Oropharynx is clear. No oropharyngeal exudate or posterior oropharyngeal erythema.  Eyes:     General: Lids are normal. No scleral icterus.       Right eye: No discharge.        Left eye: No discharge.     Conjunctiva/sclera: Conjunctivae normal.  Neck:     Trachea: Phonation normal. No tracheal deviation.  Cardiovascular:     Rate and Rhythm: Normal rate and regular rhythm.     Pulses: Normal  pulses.          Radial pulses are 2+ on the right side and 2+ on the left side.       Posterior tibial pulses are 2+ on the right side and 2+ on the left side.     Heart sounds: Normal heart sounds. No murmur heard.    No friction rub. No gallop.  Pulmonary:     Effort: Pulmonary effort is normal. No respiratory distress.     Breath sounds: Normal breath sounds. No stridor. No wheezing, rhonchi or rales.  Chest:     Chest wall: No tenderness.  Abdominal:     General: Bowel sounds are normal. There is no distension.     Palpations: Abdomen is soft.     Tenderness: There is no abdominal tenderness. There is no guarding or rebound.  Genitourinary:    General: Normal vulva.     Vagina: No vaginal discharge or tenderness.     Comments: Unable to visualize cervix, PAP/speculum causing pain, unable to get PAP with broom, used brush to obtain blind swab (no cervical or endocervical cells likely obtained) Musculoskeletal:        General: No deformity. Normal range of motion.     Cervical back: Normal range of motion and neck supple.     Right lower leg: No edema.     Left lower leg: No edema.  Lymphadenopathy:     Cervical: No cervical adenopathy.  Skin:    General: Skin is warm and dry.     Capillary Refill: Capillary refill takes less than 2 seconds.     Coloration: Skin is not jaundiced or pale.     Findings: No rash.  Neurological:     Mental Status: She is alert and oriented to person, place, and time.     Motor: No abnormal muscle tone.     Gait: Gait normal.  Psychiatric:        Speech: Speech normal.        Behavior: Behavior normal.       Fall Risk:    11/09/2022    1:44 PM 05/11/2022    1:54 PM 02/01/2022    2:33 PM 11/05/2021    1:40 PM 02/05/2021    9:04 AM  Fall Risk   Falls in the past year? 0 1 0 1 1  Number falls in past yr: 0 1 0 1 1  Injury with Fall? 0 0 0 0 0  Risk for fall due to : No Fall Risks Impaired balance/gait No Fall Risks History of fall(s)  Impaired balance/gait  Follow up Falls prevention discussed;Education provided;Falls evaluation completed Falls prevention discussed;Education provided;Falls evaluation completed Falls prevention discussed;Education provided;Falls evaluation completed Falls evaluation completed Falls prevention discussed    Functional Status Survey: Is the patient deaf or have difficulty hearing?: No Does the patient have difficulty seeing, even when wearing glasses/contacts?: Yes Does the patient have difficulty concentrating, remembering, or making decisions?: Yes Does the patient have difficulty walking or climbing stairs?: No Does the patient have difficulty dressing or bathing?: No Does the patient have difficulty doing errands alone such as visiting a doctor's office or shopping?: No   Assessment & Plan:    CPE completed today  USPSTF grade A and B recommendations reviewed with patient; age-appropriate recommendations, preventive  care, screening tests, etc discussed and encouraged; healthy living encouraged; see AVS for patient education given to patient  Discussed importance of 150 minutes of physical activity weekly, AHA exercise recommendations given to pt in AVS/handout  Discussed importance of healthy diet:  eating lean meats and proteins, avoiding trans fats and saturated fats, avoid simple sugars and excessive carbs in diet, eat 6 servings of fruit/vegetables daily and drink plenty of water and avoid sweet beverages.    Recommended pt to do annual eye exam and routine dental exams/cleanings  Depression, alcohol, fall screening completed as documented above and per flowsheets  Advance Care planning information and packet discussed and offered today, encouraged pt to discuss with family members/spouse/partner/friends and complete Advanced directive packet and bring copy to office   Reviewed Health Maintenance: Health Maintenance  Topic Date Due   PAP SMEAR-Modifier  11/21/2016    OPHTHALMOLOGY EXAM  10/04/2019   MAMMOGRAM  04/29/2022   HEMOGLOBIN A1C  10/26/2022   INFLUENZA VACCINE  11/04/2022   Zoster Vaccines- Shingrix (1 of 2) 02/09/2023 (Originally 06/08/1979)   FOOT EXAM  12/05/2022   Diabetic kidney evaluation - eGFR measurement  05/12/2023   Diabetic kidney evaluation - Urine ACR  05/12/2023   DTaP/Tdap/Td (2 - Td or Tdap) 11/06/2023   Fecal DNA (Cologuard)  12/10/2024   Hepatitis C Screening  Completed   HIV Screening  Completed   HPV VACCINES  Aged Out   COVID-19 Vaccine  Discontinued    Immunizations: Immunization History  Administered Date(s) Administered   Influenza Inj Mdck Quad Pf 12/23/2017   Influenza,inj,Quad PF,6+ Mos 03/11/2014, 12/25/2014, 01/19/2016, 01/05/2017, 01/11/2019, 05/01/2020, 02/05/2021   Influenza-Unspecified 12/23/2017   Pneumococcal Polysaccharide-23 11/05/2013, 12/23/2017   Tdap 11/05/2013   Vaccines:  HPV: up to at age 77 , ask insurance if age between 46-45  Shingrix: 30-64 yo and ask insurance if covered when patient above 22 yo Pneumonia: option to due PCV20-  educated and discussed with patient. Flu: not in season    ICD-10-CM   1. Annual physical exam  Z00.00 Hemoglobin A1c    COMPLETE METABOLIC PANEL WITH GFR    CBC with Differential/Platelet    Lipid panel    2. Diabetes mellitus, type II, insulin dependent (HCC)  E11.9 Hemoglobin A1c   Z79.4    per endocrinology    3. Hyperlipidemia associated with type 2 diabetes mellitus (HCC)  E11.69 Lipid panel   E78.5     4. Screening for malignant neoplasm of cervix  Z12.4 Cytology - PAP   doubt I have gotten cervical cells, HPV screen would be adequate, pt low risk          Danelle Berry, PA-C 11/09/22 2:12 PM  [facility]

## 2022-12-02 ENCOUNTER — Other Ambulatory Visit: Payer: Self-pay | Admitting: Nurse Practitioner

## 2022-12-02 ENCOUNTER — Other Ambulatory Visit: Payer: Self-pay | Admitting: Family Medicine

## 2022-12-02 DIAGNOSIS — J40 Bronchitis, not specified as acute or chronic: Secondary | ICD-10-CM

## 2022-12-02 DIAGNOSIS — R0602 Shortness of breath: Secondary | ICD-10-CM

## 2022-12-02 DIAGNOSIS — J069 Acute upper respiratory infection, unspecified: Secondary | ICD-10-CM

## 2022-12-02 DIAGNOSIS — G4700 Insomnia, unspecified: Secondary | ICD-10-CM

## 2022-12-02 DIAGNOSIS — Z8701 Personal history of pneumonia (recurrent): Secondary | ICD-10-CM

## 2022-12-03 NOTE — Telephone Encounter (Signed)
Requested Prescriptions  Pending Prescriptions Disp Refills   traZODone (DESYREL) 50 MG tablet [Pharmacy Med Name: TRAZODONE 50 MG TABLET] 180 tablet 1    Sig: TAKE 1/2 UP TO 2 TABLETS BY MOUTH AT BEDTIME AS NEEDED FOR SLEEP     Psychiatry: Antidepressants - Serotonin Modulator Passed - 12/02/2022  1:40 PM      Passed - Completed PHQ-2 or PHQ-9 in the last 360 days      Passed - Valid encounter within last 6 months    Recent Outpatient Visits           3 weeks ago Annual physical exam   [facility] Danelle Berry, PA-C   6 months ago Essential hypertension   [facility] Danelle Berry, PA-C   10 months ago Upper respiratory tract infection, unspecified type   [facility] Danelle Berry, PA-C   1 year ago Essential hypertension   [facility] Della Goo F, FNP   1 year ago Type 2 diabetes mellitus with hyperglycemia, without long-term current use of insulin [facility])   [facility] Danelle Berry, PA-C       Future Appointments             In 5 months Danelle Berry, PA-C [facility]

## 2022-12-22 ENCOUNTER — Other Ambulatory Visit: Payer: Self-pay | Admitting: Family Medicine

## 2022-12-22 DIAGNOSIS — F330 Major depressive disorder, recurrent, mild: Secondary | ICD-10-CM

## 2022-12-22 DIAGNOSIS — K219 Gastro-esophageal reflux disease without esophagitis: Secondary | ICD-10-CM

## 2023-02-01 ENCOUNTER — Other Ambulatory Visit: Payer: Self-pay | Admitting: Family Medicine

## 2023-02-01 DIAGNOSIS — I10 Essential (primary) hypertension: Secondary | ICD-10-CM

## 2023-02-01 DIAGNOSIS — Z8701 Personal history of pneumonia (recurrent): Secondary | ICD-10-CM

## 2023-02-01 DIAGNOSIS — J40 Bronchitis, not specified as acute or chronic: Secondary | ICD-10-CM

## 2023-02-01 DIAGNOSIS — J069 Acute upper respiratory infection, unspecified: Secondary | ICD-10-CM

## 2023-02-01 DIAGNOSIS — R0602 Shortness of breath: Secondary | ICD-10-CM

## 2023-02-01 DIAGNOSIS — I491 Atrial premature depolarization: Secondary | ICD-10-CM

## 2023-03-20 ENCOUNTER — Other Ambulatory Visit: Payer: Self-pay | Admitting: Internal Medicine

## 2023-03-20 ENCOUNTER — Other Ambulatory Visit: Payer: Self-pay | Admitting: Family Medicine

## 2023-03-20 DIAGNOSIS — Z8701 Personal history of pneumonia (recurrent): Secondary | ICD-10-CM

## 2023-03-20 DIAGNOSIS — J40 Bronchitis, not specified as acute or chronic: Secondary | ICD-10-CM

## 2023-03-20 DIAGNOSIS — E1165 Type 2 diabetes mellitus with hyperglycemia: Secondary | ICD-10-CM

## 2023-03-20 DIAGNOSIS — R0602 Shortness of breath: Secondary | ICD-10-CM

## 2023-03-20 DIAGNOSIS — K219 Gastro-esophageal reflux disease without esophagitis: Secondary | ICD-10-CM

## 2023-03-20 DIAGNOSIS — J069 Acute upper respiratory infection, unspecified: Secondary | ICD-10-CM

## 2023-03-20 DIAGNOSIS — F330 Major depressive disorder, recurrent, mild: Secondary | ICD-10-CM

## 2023-03-28 ENCOUNTER — Encounter: Payer: Self-pay | Admitting: Internal Medicine

## 2023-03-28 ENCOUNTER — Ambulatory Visit: Payer: 59 | Admitting: Internal Medicine

## 2023-03-28 VITALS — BP 136/84 | HR 55 | Ht 62.75 in | Wt 204.0 lb

## 2023-03-28 DIAGNOSIS — E119 Type 2 diabetes mellitus without complications: Secondary | ICD-10-CM | POA: Diagnosis not present

## 2023-03-28 DIAGNOSIS — E1165 Type 2 diabetes mellitus with hyperglycemia: Secondary | ICD-10-CM | POA: Diagnosis not present

## 2023-03-28 DIAGNOSIS — Z794 Long term (current) use of insulin: Secondary | ICD-10-CM | POA: Diagnosis not present

## 2023-03-28 LAB — POCT GLYCOSYLATED HEMOGLOBIN (HGB A1C): Hemoglobin A1C: 6.2 % — AB (ref 4.0–5.6)

## 2023-03-28 LAB — POCT CBG (FASTING - GLUCOSE)-MANUAL ENTRY: Glucose Fasting, POC: 138 mg/dL — AB (ref 70–99)

## 2023-03-28 MED ORDER — SEMAGLUTIDE (2 MG/DOSE) 8 MG/3ML ~~LOC~~ SOPN: 2.0000 mg | PEN_INJECTOR | SUBCUTANEOUS | 0 refills | Status: DC

## 2023-03-28 MED ORDER — LANTUS SOLOSTAR 100 UNIT/ML ~~LOC~~ SOPN: 46.0000 [IU] | PEN_INJECTOR | SUBCUTANEOUS | 4 refills | Status: DC

## 2023-03-28 MED ORDER — INSULIN PEN NEEDLE 31G X 5 MM MISC: 1.0000 | Freq: Every day | 3 refills | Status: DC

## 2023-03-28 NOTE — Progress Notes (Signed)
Name: Alicia Pope  Age/ Sex: 62 y.o., female   MRN/ DOB: [id number], 1960/10/08     PCP: Danelle Berry, PA-C   Reason for Endocrinology Evaluation: Type 2 Diabetes Mellitus  Initial Endocrine Consultative Visit: 03/15/2018    PATIENT IDENTIFIER: Alicia Pope is a 62 y.o. female with a past medical history of DM, HTN, GERD and dyslipidemia. The patient has followed with Endocrinology clinic since 03/15/2018 for consultative assistance with management of her diabetes.  DIABETIC HISTORY:  Alicia Pope was diagnosed with DM 2004, and started insulin therapy in 2018, she is intolerant to Invokana due to recurrent vaginitis.Marland Kitchen Her hemoglobin A1c has ranged from 5.7% in 2020, peaking at 9.2% in 2021.   She was followed up by Dr. Everardo All between 2019 and May 2023  SUBJECTIVE:   During the last visit (04/27/2022): A1c 6.1%     Today (03/28/2023): Alicia Pope is here for follow-up on diabetes management.  She checks her blood sugars occasionally. The patient has had hypoglycemic episodes since the last clinic visit   She is received right wrist glucocortoid injection 3 days ag  Patient has been noted weight loss Denies constipation or diarrhea   HOME DIABETES REGIMEN:  Lantus 56 units daily  Ozempic 2 mg weekly     Statin: Yes ACE-I/ARB: Yes    METER DOWNLOAD SUMMARY: n/a    DIABETIC COMPLICATIONS: Microvascular complications:   Denies: CKD, retinopathy, neuropathy  Last Eye Exam: Completed 2024  Macrovascular complications:   Denies: CAD, CVA, PVD   HISTORY:  Past Medical History:  Past Medical History:  Diagnosis Date   Allergy    Anxiety 2013   Asthma 2015   Asthma    Cataract (lens) fragments in eye following cataract surgery, bilateral    Depression 2013   Diabetes mellitus 2004   Finding of multiple premature atrial contractions by electrocardiography    GERD (gastroesophageal reflux disease) 2013   Hyperlipidemia 1992   Hypertension 1992    Memory impairment    Past Surgical History:  Past Surgical History:  Procedure Laterality Date   ANTERIOR CRUCIATE LIGAMENT REPAIR  1997   APPENDECTOMY  1987   CARDIAC EVENT MONITOR  03/2019   No average rate 60 bpm.  Max HR 122, minimum HR 36 bpm-sinus bradycardia with PACs and bigeminy.  Frequent PACs seen as isolated, couplets, triplets as well as frequent bigeminy.:   CESAREAN SECTION     [phone]   CHOLECYSTECTOMY  1987   NM MYOVIEW LTD  05/2019   Normal EF 60%.  Poor control hypertension.  (194/101 mmHg).  Small size moderate severity fixed defect in the mid anterior and apical anterior wall likely representing breast attenuation artifact.  No evidence of ischemia (reversibility).  LOW RISK   TRANSTHORACIC ECHOCARDIOGRAM  03/2019   EF 55-60 % with normal wall motion.  Slight LA dilation with grade 1 diastolic function.  Moderate aortic valve calcification-sclerosis without stenosis.  Otherwise normal.   uterine ablation     Social History:  reports that she has never smoked. She has never used smokeless tobacco. She reports that she does not drink alcohol and does not use drugs. Family History:  Family History  Problem Relation Age of Onset   Cancer Father        Lymphoma, Leukemia   Depression Father    Cancer Maternal Grandfather    Cancer Paternal Grandmother    Diabetes Paternal Grandfather    Kidney disease Paternal Grandfather  HOME MEDICATIONS: Allergies as of 03/28/2023       Reactions   Contrast Media [iodinated Contrast Media] Anaphylaxis   Ambien [zolpidem Tartrate] Other (See Comments)   Family has told her that after taking Ambien, she does "crazy things" that she does not remember doing.    Codeine Nausea And Vomiting        Medication List        Accurate as of March 28, 2023 12:16 PM. If you have any questions, ask your nurse or doctor.          albuterol 108 (90 Base) MCG/ACT inhaler Commonly known as: VENTOLIN HFA INHALE 2  PUFFS BY MOUTH EVERY 4 HOURS AS NEEDED FOR WHEEZE OR FOR SHORTNESS OF BREATH   aspirin 81 MG tablet Take 1 tablet (81 mg total) by mouth daily.   [facility] D3 50 MCG (2000 UT) Caps Generic drug: Cholecalciferol Take 2 capsules (4,000 Units total) by mouth daily.   FLUoxetine 20 MG capsule Commonly known as: PROZAC TAKE 1 CAPSULE BY MOUTH EVERY DAY   guaiFENesin 600 MG 12 hr tablet Commonly known as: Mucinex Take 1 tablet (600 mg total) by mouth 2 (two) times daily.   Insulin Pen Needle 31G X 5 MM Misc 1 Device by Does not apply route daily in the afternoon.   Lantus SoloStar 100 UNIT/ML Solostar Pen Generic drug: insulin glargine INJECT 56 UNITS INTO THE SKIN EVERY MORNING.   lisinopril-hydrochlorothiazide 20-12.5 MG tablet Commonly known as: ZESTORETIC TAKE 1 TABLET BY MOUTH EVERY DAY   LORazepam 1 MG tablet Commonly known as: ATIVAN Take 0.5-1 tablets (0.5-1 mg total) by mouth daily as needed for anxiety (sparingly for severe anxiety/panic attacks, Rx to last 6 months).   metoprolol succinate 25 MG 24 hr tablet Commonly known as: TOPROL-XL TAKE 1 TABLET (25 MG TOTAL) BY MOUTH DAILY.   pantoprazole 40 MG tablet Commonly known as: PROTONIX TAKE 1 TABLET BY MOUTH EVERY DAY   predniSONE 20 MG tablet Commonly known as: DELTASONE 2 tabs poqday 1-3, 1 tabs poqday 4-6   rosuvastatin 40 MG tablet Commonly known as: CRESTOR TAKE 1 TABLET BY MOUTH EVERY DAY   Semaglutide (2 MG/DOSE) 8 MG/3ML Sopn Inject 2 mg as directed once a week.   tiZANidine 4 MG tablet Commonly known as: ZANAFLEX SMARTSIG:3 Tablet(s) By Mouth Every Other Day PRN   traZODone 50 MG tablet Commonly known as: DESYREL TAKE 1/2 UP TO 2 TABLETS BY MOUTH AT BEDTIME AS NEEDED FOR SLEEP         OBJECTIVE:   Vital Signs: There were no vitals taken for this visit.  Wt Readings from Last 3 Encounters:  11/09/22 204 lb (92.5 kg)  05/11/22 208 lb 14.4 oz (94.8 kg)  04/27/22 209 lb (94.8 kg)      Exam: General: Pt appears well and is in NAD  Lungs: Clear with good BS bilat with no rales, rhonchi, or wheezes  Heart: RRR   Neuro: MS is good with appropriate affect, pt is alert and Ox3    DM foot exam: 03/28/2023  The skin of the feet is intact without sores or ulcerations. The pedal pulses are 2+ on right and 2+ on left. The sensation is intact to a screening 5.07, 10 gram monofilament bilaterally    DATA REVIEWED:  Lab Results  Component Value Date   HGBA1C 6.3 (H) 11/09/2022   HGBA1C 6.1 (A) 04/27/2022   HGBA1C 7.4 (A) 12/04/2021    Latest Reference Range & Units  11/09/22 14:52  Sodium 135 - 146 mmol/L 140  Potassium 3.5 - 5.3 mmol/L 4.1  Chloride 98 - 110 mmol/L 103  CO2 20 - 32 mmol/L 29  Glucose 65 - 99 mg/dL 71  Mean Plasma Glucose mg/dL 960  BUN 7 - 25 mg/dL 13  Creatinine 4.54 - 0.98 mg/dL 1.19  Calcium 8.6 - 14.7 mg/dL 9.7  BUN/Creatinine Ratio 6 - 22 (calc) SEE NOTE:  eGFR > OR = 60 mL/min/1.68m2 86  AG Ratio 1.0 - 2.5 (calc) 1.6  AST 10 - 35 U/L 14  ALT 6 - 29 U/L 14  Total Protein 6.1 - 8.1 g/dL 6.8  Total Bilirubin 0.2 - 1.2 mg/dL 0.5    Latest Reference Range & Units 11/09/22 14:52  Total CHOL/HDL Ratio <5.0 (calc) 3.2  Cholesterol <200 mg/dL 829  HDL Cholesterol > OR = 50 mg/dL 52  LDL Cholesterol (Calc) mg/dL (calc) 85  Non-HDL Cholesterol (Calc) <130 mg/dL (calc) 562  Triglycerides <150 mg/dL 130 (H)  Alkaline phosphatase (APISO) 37 - 153 U/L 57  Globulin 1.9 - 3.7 g/dL (calc) 2.6  (H): Data is abnormally high  ASSESSMENT / PLAN / RECOMMENDATIONS:   1) Type 2 Diabetes Mellitus, Optimally controlled, Without complications - Most recent A1c of 6.2%. Goal A1c < 7.0 %.    -A1c continues up to be optimal -She does endorse hypoglycemia, will decrease insulin as below - Dexcom is cost prohibitive ~ $250 a month -Intolerant to invokana due to recurrent genital infections   MEDICATIONS: Continue Ozempic 2 mg weekly Decrease Lantus  46 units daily  EDUCATION / INSTRUCTIONS: BG monitoring instructions: Patient is instructed to check her blood sugars 1 times a day, fast thing. Call [place] [facility] if: BG persistently < 70  I reviewed the Rule of 15 for the treatment of hypoglycemia in detail with the patient. Literature supplied.   2) Diabetic complications:  Eye: Does not have known diabetic retinopathy.  Neuro/ Feet: Does not have known diabetic peripheral neuropathy .  Renal: Patient does not have known baseline CKD. She   is  on an ACEI/ARB at present.     F/U in 6 months   Signed electronically by: Lyndle Herrlich, MD  [facility] Endocrinology  [facility] [address] [place] [place], [place] [address] Phone: [phone] FAX: [phone]   CC: Sander Radon [address] [place] [address] Phone: [phone]  Fax: [phone]  Return to Endocrinology clinic as below: Future Appointments  Date Time Provider Department Center  03/28/2023  2:40 PM Ascencion Stegner, Konrad Dolores, MD [facility] None  05/11/2023  1:40 PM Danelle Berry, PA-C [facility]

## 2023-03-28 NOTE — Patient Instructions (Addendum)
Continue  Ozempic 2 mg weekly  Decrease  Lantus 46 units daily     HOW TO TREAT LOW BLOOD SUGARS (Blood sugar LESS THAN 70 MG/DL) Please follow the RULE OF 15 for the treatment of hypoglycemia treatment (when your (blood sugars are less than 70 mg/dL)   STEP 1: Take 15 grams of carbohydrates when your blood sugar is low, which includes:  3-4 GLUCOSE TABS  OR 3-4 OZ OF JUICE OR REGULAR SODA OR ONE TUBE OF GLUCOSE GEL    STEP 2: RECHECK blood sugar in 15 MINUTES STEP 3: If your blood sugar is still low at the 15 minute recheck --> then, go back to STEP 1 and treat AGAIN with another 15 grams of carbohydrates.

## 2023-04-27 ENCOUNTER — Other Ambulatory Visit: Payer: Self-pay | Admitting: Family Medicine

## 2023-04-27 DIAGNOSIS — I10 Essential (primary) hypertension: Secondary | ICD-10-CM

## 2023-04-27 DIAGNOSIS — I491 Atrial premature depolarization: Secondary | ICD-10-CM

## 2023-04-27 NOTE — Telephone Encounter (Signed)
Requested Prescriptions  Pending Prescriptions Disp Refills   lisinopril-hydrochlorothiazide (ZESTORETIC) 20-12.5 MG tablet [Pharmacy Med Name: LISINOPRIL-HCTZ 20-12.5 MG TAB] 90 tablet 0    Sig: TAKE 1 TABLET BY MOUTH EVERY DAY     Cardiovascular:  ACEI + Diuretic Combos Passed - 04/27/2023  4:37 PM      Passed - Na in normal range and within 180 days    Sodium  Date Value Ref Range Status  11/09/2022 140 135 - 146 mmol/L Final         Passed - K in normal range and within 180 days    Potassium  Date Value Ref Range Status  11/09/2022 4.1 3.5 - 5.3 mmol/L Final         Passed - Cr in normal range and within 180 days    Creat  Date Value Ref Range Status  11/09/2022 0.78 0.50 - 1.05 mg/dL Final   Creatinine, Urine  Date Value Ref Range Status  05/11/2022 107 20 - 275 mg/dL Final         Passed - eGFR is 30 or above and within 180 days    GFR, Est African American  Date Value Ref Range Status  05/01/2020 101 > OR = 60 mL/min/1.51m2 Final   GFR, Est Non African American  Date Value Ref Range Status  05/01/2020 87 > OR = 60 mL/min/1.37m2 Final   eGFR  Date Value Ref Range Status  11/09/2022 86 > OR = 60 mL/min/1.66m2 Final         Passed - Patient is not pregnant      Passed - Last BP in normal range    BP Readings from Last 1 Encounters:  03/28/23 136/84         Passed - Valid encounter within last 6 months    Recent Outpatient Visits           5 months ago Annual physical exam   [facility] Danelle Berry, PA-C   11 months ago Essential hypertension   [facility] Danelle Berry, PA-C   1 year ago Upper respiratory tract infection, unspecified type   [facility] Danelle Berry, PA-C   1 year ago Essential hypertension   [facility] Della Goo F, FNP   2 years ago Type 2 diabetes mellitus with hyperglycemia, without long-term current use of insulin [facility])    [facility] Danelle Berry, PA-C       Future Appointments             In 2 weeks Danelle Berry, PA-C [facility]             metoprolol succinate (TOPROL-XL) 25 MG 24 hr tablet [Pharmacy Med Name: METOPROLOL SUCC ER 25 MG TAB] 90 tablet 0    Sig: TAKE 1 TABLET (25 MG TOTAL) BY MOUTH DAILY.     Cardiovascular:  Beta Blockers Passed - 04/27/2023  4:37 PM      Passed - Last BP in normal range    BP Readings from Last 1 Encounters:  03/28/23 136/84         Passed - Last Heart Rate in normal range    Pulse Readings from Last 1 Encounters:  03/28/23 (!) 55         Passed - Valid encounter within last 6 months    Recent Outpatient Visits  5 months ago Annual physical exam   [facility] Danelle Berry, [place]   11 months ago Essential hypertension   [facility] Danelle Berry, PA-C   1 year ago Upper respiratory tract infection, unspecified type   [facility] Danelle Berry, PA-C   1 year ago Essential hypertension   [facility] Della Goo F, FNP   2 years ago Type 2 diabetes mellitus with hyperglycemia, without long-term current use of insulin [facility])   [facility] Danelle Berry, PA-C       Future Appointments             In 2 weeks Danelle Berry, PA-C [facility]

## 2023-05-03 ENCOUNTER — Other Ambulatory Visit: Payer: Self-pay | Admitting: Internal Medicine

## 2023-05-06 ENCOUNTER — Other Ambulatory Visit: Payer: Self-pay | Admitting: Internal Medicine

## 2023-05-06 DIAGNOSIS — E1165 Type 2 diabetes mellitus with hyperglycemia: Secondary | ICD-10-CM

## 2023-05-10 ENCOUNTER — Other Ambulatory Visit: Payer: Self-pay | Admitting: Family Medicine

## 2023-05-10 DIAGNOSIS — G4700 Insomnia, unspecified: Secondary | ICD-10-CM

## 2023-05-11 ENCOUNTER — Ambulatory Visit: Payer: Self-pay | Admitting: Family Medicine

## 2023-05-11 DIAGNOSIS — E119 Type 2 diabetes mellitus without complications: Secondary | ICD-10-CM

## 2023-05-11 DIAGNOSIS — I10 Essential (primary) hypertension: Secondary | ICD-10-CM

## 2023-05-11 DIAGNOSIS — E1169 Type 2 diabetes mellitus with other specified complication: Secondary | ICD-10-CM

## 2023-05-11 DIAGNOSIS — Z1231 Encounter for screening mammogram for malignant neoplasm of breast: Secondary | ICD-10-CM

## 2023-05-11 NOTE — Telephone Encounter (Signed)
 90 day courtesy refill  Requested Prescriptions  Pending Prescriptions Disp Refills   traZODone  (DESYREL ) 50 MG tablet [Pharmacy Med Name: TRAZODONE  50 MG TABLET] 180 tablet 0    Sig: TAKE 1/2 UP TO 2 TABLETS BY MOUTH AT BEDTIME AS NEEDED FOR SLEEP     Psychiatry: Antidepressants - Serotonin Modulator Failed - 05/11/2023  2:57 PM      Failed - Valid encounter within last 6 months    Recent Outpatient Visits           6 months ago Annual physical exam   [facility] Leavy Mole, PA-C   1 year ago Essential hypertension   [facility] Leavy Mole, PA-C   1 year ago Upper respiratory tract infection, unspecified type   [facility] Leavy Mole, PA-C   1 year ago Essential hypertension   [facility] Gareth Clarity F, FNP   2 years ago Type 2 diabetes mellitus with hyperglycemia, without long-term current use of insulin  [facility])   [facility] Leavy Mole, PA-C       Future Appointments             In 1 week Leavy Mole, PA-C [facility], [facility]            Passed - Completed PHQ-2 or PHQ-9 in the last 360 days

## 2023-05-20 ENCOUNTER — Ambulatory Visit: Payer: 59 | Admitting: Nurse Practitioner

## 2023-05-20 ENCOUNTER — Encounter: Payer: Self-pay | Admitting: Nurse Practitioner

## 2023-05-20 ENCOUNTER — Ambulatory Visit: Payer: 59 | Admitting: Family Medicine

## 2023-05-20 VITALS — BP 132/78 | HR 64 | Resp 18 | Ht 62.75 in | Wt 204.8 lb

## 2023-05-20 DIAGNOSIS — E1169 Type 2 diabetes mellitus with other specified complication: Secondary | ICD-10-CM | POA: Diagnosis not present

## 2023-05-20 DIAGNOSIS — K219 Gastro-esophageal reflux disease without esophagitis: Secondary | ICD-10-CM

## 2023-05-20 DIAGNOSIS — E785 Hyperlipidemia, unspecified: Secondary | ICD-10-CM

## 2023-05-20 DIAGNOSIS — G4700 Insomnia, unspecified: Secondary | ICD-10-CM

## 2023-05-20 DIAGNOSIS — Z6838 Body mass index (BMI) 38.0-38.9, adult: Secondary | ICD-10-CM

## 2023-05-20 DIAGNOSIS — F330 Major depressive disorder, recurrent, mild: Secondary | ICD-10-CM

## 2023-05-20 DIAGNOSIS — F411 Generalized anxiety disorder: Secondary | ICD-10-CM

## 2023-05-20 DIAGNOSIS — I10 Essential (primary) hypertension: Secondary | ICD-10-CM | POA: Diagnosis not present

## 2023-05-20 DIAGNOSIS — E119 Type 2 diabetes mellitus without complications: Secondary | ICD-10-CM

## 2023-05-20 DIAGNOSIS — F410 Panic disorder [episodic paroxysmal anxiety] without agoraphobia: Secondary | ICD-10-CM

## 2023-05-20 DIAGNOSIS — Z794 Long term (current) use of insulin: Secondary | ICD-10-CM

## 2023-05-20 DIAGNOSIS — Z1231 Encounter for screening mammogram for malignant neoplasm of breast: Secondary | ICD-10-CM

## 2023-05-20 MED ORDER — PANTOPRAZOLE SODIUM 40 MG PO TBEC: 40.0000 mg | DELAYED_RELEASE_TABLET | Freq: Every day | ORAL | 0 refills | Status: DC

## 2023-05-20 MED ORDER — FLUOXETINE HCL 20 MG PO CAPS: 20.0000 mg | ORAL_CAPSULE | Freq: Every day | ORAL | 1 refills | Status: DC

## 2023-05-20 NOTE — Progress Notes (Signed)
BP 132/78   Pulse 64   Resp 18   Ht 5' 2.75" (1.594 m)   Wt 204 lb 12.8 oz (92.9 kg)   SpO2 96%   BMI 36.57 kg/m    Subjective:    [name], female    DOB: [birth year], 63 y.o.   MRN: [id number]  HPI: Alicia Pope is a 63 y.o. female  Chief Complaint  Patient presents with   Medical Management of Chronic Issues    Discussed the use of AI scribe software for clinical note transcription with the patient, who gave verbal consent to proceed.  History of Present Illness   The patient, with a history of hypertension, GERD, hyperlipidemia, type two diabetes, depression, insomnia, anxiety, and obesity, reports that her blood sugars have been "pretty good" and her heart condition is well-managed with medication. She takes Prozac for her mood and Ativan as needed, trying to avoid regular use. She uses trazodone almost every night for sleep and finds it "all right." She uses albuterol a couple of times a week due to lung damage from previous pneumonia. She also takes tizanidine for back pain, but only has a limited supply. She recently [facility] to [facility] and has [facility]. She has been informed that her insurance no longer covers Ozempic, a medication she takes for her diabetes.       05/20/2023    9:59 AM 11/09/2022    1:44 PM 05/11/2022    1:54 PM  Depression screen PHQ 2/9  Decreased Interest 0 0 0  Down, Depressed, Hopeless 0 0 0  PHQ - 2 Score 0 0 0  Altered sleeping 3 0 0  Tired, decreased energy 2 0 0  Change in appetite 3 0 0  Feeling bad or failure about yourself  0 0 0  Trouble concentrating 0 0 0  Moving slowly or fidgety/restless 0 0 0  Suicidal thoughts 0 0 0  PHQ-9 Score 8 0 0  Difficult doing work/chores Somewhat difficult Not difficult at all Not difficult at all    Relevant past medical, surgical, family and social history reviewed and updated as indicated. Interim medical history since our last visit reviewed. Allergies  and medications reviewed and updated.  Review of Systems  Constitutional: Negative for fever or weight change.  Respiratory: Negative for cough and shortness of breath.   Cardiovascular: Negative for chest pain or palpitations.  Gastrointestinal: Negative for abdominal pain, no bowel changes.  Musculoskeletal: Negative for gait problem or joint swelling.  Skin: Negative for rash.  Neurological: Negative for dizziness or headache.  No other specific complaints in a complete review of systems (except as listed in HPI above).      Objective:    BP 132/78   Pulse 64   Resp 18   Ht 5' 2.75" (1.594 m)   Wt 204 lb 12.8 oz (92.9 kg)   SpO2 96%   BMI 36.57 kg/m    Wt Readings from Last 3 Encounters:  05/20/23 204 lb 12.8 oz (92.9 kg)  03/28/23 204 lb (92.5 kg)  11/09/22 204 lb (92.5 kg)    Physical Exam Vitals reviewed.  Constitutional:      Appearance: Normal appearance.  HENT:     Head: Normocephalic.  Cardiovascular:     Rate and Rhythm: Normal rate and regular rhythm.  Pulmonary:     Effort: Pulmonary effort is normal.     Breath sounds: Normal breath sounds.  Musculoskeletal:  General: Normal range of motion.  Skin:    General: Skin is warm and dry.  Neurological:     General: No focal deficit present.     Mental Status: She is alert and oriented to person, place, and time. Mental status is at baseline.  Psychiatric:        Mood and Affect: Mood normal.        Behavior: Behavior normal.        Thought Content: Thought content normal.        Judgment: Judgment normal.     Results for orders placed or performed in visit on 03/28/23  POCT CBG (Fasting - Glucose)   Collection Time: 03/28/23  2:48 PM  Result Value Ref Range   Glucose Fasting, POC 138 (A) 70 - 99 mg/dL  POCT glycosylated hemoglobin (Hb A1C)   Collection Time: 03/28/23  2:50 PM  Result Value Ref Range   Hemoglobin A1C 6.2 (A) 4.0 - 5.6 %   HbA1c POC (<> result, manual entry)     HbA1c, POC  (prediabetic range)     HbA1c, POC (controlled diabetic range)         Assessment & Plan:   Problem List Items Addressed This Visit       Cardiovascular and Mediastinum   Essential hypertension (Chronic)   Relevant Orders   CBC with Differential/Platelet   COMPLETE METABOLIC PANEL WITH GFR     Digestive   Gastroesophageal reflux disease   Relevant Medications   pantoprazole (PROTONIX) 40 MG tablet     Endocrine   Diabetes mellitus, type II, insulin dependent (HCC) (Chronic)   Relevant Orders   COMPLETE METABOLIC PANEL WITH GFR   Hemoglobin A1c   Microalbumin / creatinine urine ratio     Other   Mixed hyperlipidemia   Class 2 severe obesity with serious comorbidity and body mass index (BMI) of 38.0 to 38.9 in adult (HCC)   Insomnia   Mild episode of recurrent major depressive disorder (HCC)   Relevant Medications   FLUoxetine (PROZAC) 20 MG capsule   Generalized anxiety disorder with panic attacks   Relevant Medications   FLUoxetine (PROZAC) 20 MG capsule   Other Visit Diagnoses       Encounter for screening mammogram for malignant neoplasm of breast    -  Primary   Relevant Orders   MM 3D SCREENING MAMMOGRAM BILATERAL BREAST        Assessment and Plan    Type 2 Diabetes Mellitus Last A1c was 6.3. Currently managed with Ozempic 2mg  weekly and Lantus 46 units daily. However, insurance coverage for Ozempic is an issue. -Advise patient to inquire about alternative medications covered by insurance and communicate this to endocrinologist for appropriate dosage calculation.  Hypertension Managed with Lisinopril-Hydrochlorothiazide 20-12.5mg  daily. -Continue current medication.  Hyperlipidemia Managed with Rosuvastatin 40mg  daily. -Continue current medication.  Depression/Anxiety Managed with Prozac 20mg  daily and Ativan 0.5-1mg  as needed. Patient tries to avoid Ativan unless necessary. -Continue current medication regimen.  Insomnia Managed with Trazodone  50mg  daily. -Continue current medication.  Gastroesophageal Reflux Disease (GERD) Managed with Protonix 40mg  daily. -Continue current medication.  Premature Atrial Contractions (PACs) and Heart Murmur Managed with Metoprolol 25mg  daily. -Continue current medication.  Muscle Spasms Managed with Tizanidine as needed. -Continue current medication.  Reactive Airway Disease Managed with Albuterol inhaler as needed. -Continue current medication.  General Health Maintenance -Order routine blood work. -Continue Aspirin 81mg  daily for cardiovascular protection.        Follow  up plan: Return in about 6 months (around 11/17/2023) for follow up with leisa.

## 2023-05-21 LAB — COMPLETE METABOLIC PANEL WITH GFR
AG Ratio: 1.6 (calc) (ref 1.0–2.5)
ALT: 22 U/L (ref 6–29)
AST: 22 U/L (ref 10–35)
Albumin: 4.3 g/dL (ref 3.6–5.1)
Alkaline phosphatase (APISO): 49 U/L (ref 37–153)
BUN: 15 mg/dL (ref 7–25)
CO2: 29 mmol/L (ref 20–32)
Calcium: 9.5 mg/dL (ref 8.6–10.4)
Chloride: 101 mmol/L (ref 98–110)
Creat: 0.7 mg/dL (ref 0.50–1.05)
Globulin: 2.7 g/dL (ref 1.9–3.7)
Glucose, Bld: 98 mg/dL (ref 65–99)
Potassium: 4.5 mmol/L (ref 3.5–5.3)
Sodium: 139 mmol/L (ref 135–146)
Total Bilirubin: 0.5 mg/dL (ref 0.2–1.2)
Total Protein: 7 g/dL (ref 6.1–8.1)
eGFR: 98 mL/min/{1.73_m2} (ref >=60)

## 2023-05-21 LAB — CBC WITH DIFFERENTIAL/PLATELET
Absolute Lymphocytes: 2085 {cells}/uL (ref 850–3900)
Absolute Monocytes: 491 {cells}/uL (ref 200–950)
Basophils Absolute: 38 {cells}/uL (ref 0–200)
Basophils Relative: 0.6 %
Eosinophils Absolute: 233 {cells}/uL (ref 15–500)
Eosinophils Relative: 3.7 %
HCT: 43.7 % (ref 35.0–45.0)
Hemoglobin: 14.8 g/dL (ref 11.7–15.5)
MCH: 29.2 pg (ref 27.0–33.0)
MCHC: 33.9 g/dL (ref 32.0–36.0)
MCV: 86.4 fL (ref 80.0–100.0)
MPV: 10.6 fL (ref 7.5–12.5)
Monocytes Relative: 7.8 %
Neutro Abs: 3452 {cells}/uL (ref 1500–7800)
Neutrophils Relative %: 54.8 %
Platelets: 198 10*3/uL (ref 140–400)
RBC: 5.06 10*6/uL (ref 3.80–5.10)
RDW: 12.9 % (ref 11.0–15.0)
Total Lymphocyte: 33.1 %
WBC: 6.3 10*3/uL (ref 3.8–10.8)

## 2023-05-21 LAB — LIPID PANEL
Cholesterol: 164 mg/dL (ref <200)
HDL: 65 mg/dL (ref >=50)
LDL Cholesterol (Calc): 75 mg/dL
Non-HDL Cholesterol (Calc): 99 mg/dL (ref <130)
Total CHOL/HDL Ratio: 2.5 (calc) (ref <5.0)
Triglycerides: 164 mg/dL — ABNORMAL HIGH (ref <150)

## 2023-05-21 LAB — MICROALBUMIN / CREATININE URINE RATIO
Creatinine, Urine: 175 mg/dL (ref 20–275)
Microalb Creat Ratio: 5 mg/g{creat} (ref <30)
Microalb, Ur: 0.8 mg/dL

## 2023-05-21 LAB — HEMOGLOBIN A1C
Hgb A1c MFr Bld: 6.5 %{Hb} — ABNORMAL HIGH (ref <5.7)
Mean Plasma Glucose: 140 mg/dL
eAG (mmol/L): 7.7 mmol/L

## 2023-05-23 ENCOUNTER — Encounter: Payer: Self-pay | Admitting: Nurse Practitioner

## 2023-06-05 ENCOUNTER — Other Ambulatory Visit: Payer: Self-pay | Admitting: Family Medicine

## 2023-06-05 DIAGNOSIS — J069 Acute upper respiratory infection, unspecified: Secondary | ICD-10-CM

## 2023-06-05 DIAGNOSIS — R0602 Shortness of breath: Secondary | ICD-10-CM

## 2023-06-05 DIAGNOSIS — J40 Bronchitis, not specified as acute or chronic: Secondary | ICD-10-CM

## 2023-06-05 DIAGNOSIS — Z8701 Personal history of pneumonia (recurrent): Secondary | ICD-10-CM

## 2023-06-17 ENCOUNTER — Other Ambulatory Visit: Payer: Self-pay | Admitting: Internal Medicine

## 2023-06-17 ENCOUNTER — Other Ambulatory Visit: Payer: Self-pay | Admitting: Family Medicine

## 2023-06-17 DIAGNOSIS — R0602 Shortness of breath: Secondary | ICD-10-CM

## 2023-06-17 DIAGNOSIS — Z8701 Personal history of pneumonia (recurrent): Secondary | ICD-10-CM

## 2023-06-17 DIAGNOSIS — J069 Acute upper respiratory infection, unspecified: Secondary | ICD-10-CM

## 2023-06-17 DIAGNOSIS — F330 Major depressive disorder, recurrent, mild: Secondary | ICD-10-CM

## 2023-06-17 DIAGNOSIS — K219 Gastro-esophageal reflux disease without esophagitis: Secondary | ICD-10-CM

## 2023-06-17 DIAGNOSIS — J40 Bronchitis, not specified as acute or chronic: Secondary | ICD-10-CM

## 2023-07-06 ENCOUNTER — Other Ambulatory Visit: Payer: Self-pay | Admitting: Family Medicine

## 2023-07-06 DIAGNOSIS — Z8701 Personal history of pneumonia (recurrent): Secondary | ICD-10-CM

## 2023-07-06 DIAGNOSIS — K219 Gastro-esophageal reflux disease without esophagitis: Secondary | ICD-10-CM

## 2023-07-06 DIAGNOSIS — J40 Bronchitis, not specified as acute or chronic: Secondary | ICD-10-CM

## 2023-07-06 DIAGNOSIS — R0602 Shortness of breath: Secondary | ICD-10-CM

## 2023-07-06 DIAGNOSIS — J069 Acute upper respiratory infection, unspecified: Secondary | ICD-10-CM

## 2023-07-06 DIAGNOSIS — F330 Major depressive disorder, recurrent, mild: Secondary | ICD-10-CM

## 2023-07-16 ENCOUNTER — Other Ambulatory Visit: Payer: Self-pay | Admitting: Family Medicine

## 2023-07-16 DIAGNOSIS — R0602 Shortness of breath: Secondary | ICD-10-CM

## 2023-07-16 DIAGNOSIS — J40 Bronchitis, not specified as acute or chronic: Secondary | ICD-10-CM

## 2023-07-16 DIAGNOSIS — Z8701 Personal history of pneumonia (recurrent): Secondary | ICD-10-CM

## 2023-07-16 DIAGNOSIS — J069 Acute upper respiratory infection, unspecified: Secondary | ICD-10-CM

## 2023-07-18 NOTE — Telephone Encounter (Signed)
.   Requested Prescriptions  Pending Prescriptions Disp Refills   albuterol (VENTOLIN HFA) 108 (90 Base) MCG/ACT inhaler [Pharmacy Med Name: ALBUTEROL HFA (PROAIR) INHALER] 8.5 each 2    Sig: INHALE 2 PUFFS BY MOUTH EVERY 4 HOURS AS NEEDED FOR WHEEZE OR FOR SHORTNESS OF BREATH     Pulmonology:  Beta Agonists 2 Passed - 07/18/2023 12:42 PM      Passed - Last BP in normal range    BP Readings from Last 1 Encounters:  05/20/23 132/78         Passed - Last Heart Rate in normal range    Pulse Readings from Last 1 Encounters:  05/20/23 64         Passed - Valid encounter within last 12 months    Recent Outpatient Visits           1 month ago Encounter for screening mammogram for malignant neoplasm of breast   [facility] Quinton Buckler, FNP       Future Appointments             In 4 months Adeline Hone, PA-C [facility]

## 2023-08-08 ENCOUNTER — Encounter: Payer: Self-pay | Admitting: Family Medicine

## 2023-08-08 ENCOUNTER — Ambulatory Visit: Admitting: Family Medicine

## 2023-08-08 VITALS — BP 124/70 | HR 64 | Resp 16 | Ht 62.0 in | Wt 202.0 lb

## 2023-08-08 DIAGNOSIS — R1310 Dysphagia, unspecified: Secondary | ICD-10-CM

## 2023-08-08 DIAGNOSIS — Z09 Encounter for follow-up examination after completed treatment for conditions other than malignant neoplasm: Secondary | ICD-10-CM

## 2023-08-08 DIAGNOSIS — K219 Gastro-esophageal reflux disease without esophagitis: Secondary | ICD-10-CM | POA: Diagnosis not present

## 2023-08-08 DIAGNOSIS — R0789 Other chest pain: Secondary | ICD-10-CM | POA: Diagnosis not present

## 2023-08-08 MED ORDER — FAMOTIDINE 20 MG PO TABS: 20.0000 mg | ORAL_TABLET | Freq: Two times a day (BID) | ORAL | 1 refills | Status: DC

## 2023-08-08 MED ORDER — PANTOPRAZOLE SODIUM 40 MG PO TBEC: 40.0000 mg | DELAYED_RELEASE_TABLET | Freq: Two times a day (BID) | ORAL | 0 refills | Status: DC

## 2023-08-08 MED ORDER — ONDANSETRON 4 MG PO TBDP: 4.0000 mg | ORAL_TABLET | Freq: Three times a day (TID) | ORAL | 1 refills | Status: AC | PRN

## 2023-08-08 NOTE — Progress Notes (Unsigned)
 Name: Alicia Pope   MRN: [id number]    DOB: [birth year]   Date:08/08/2023       Progress Note  Chief Complaint  Patient presents with   Hospitalization Follow-up    Feeling better     Subjective:   Alicia Pope is a 63 y.o. female, presents to clinic for routine follow up on chronic conditions     Current Outpatient Medications:    albuterol  (VENTOLIN  HFA) 108 (90 Base) MCG/ACT inhaler, INHALE 2 PUFFS BY MOUTH EVERY 4 HOURS AS NEEDED FOR WHEEZE OR FOR SHORTNESS OF BREATH, Disp: 8.5 each, Rfl: 2   aspirin  81 MG tablet, Take 1 tablet (81 mg total) by mouth daily., Disp: 30 tablet, Rfl: 11   FLUoxetine  (PROZAC ) 20 MG capsule, TAKE 1 CAPSULE BY MOUTH EVERY DAY, Disp: 90 capsule, Rfl: 1   insulin  glargine (LANTUS  SOLOSTAR) 100 UNIT/ML Solostar Pen, Inject 46 Units into the skin every morning., Disp: 60 mL, Rfl: 1   Insulin  Pen Needle 31G X 5 MM MISC, 1 Device by Does not apply route daily in the afternoon., Disp: 100 each, Rfl: 3   lisinopril -hydrochlorothiazide  (ZESTORETIC ) 20-12.5 MG tablet, TAKE 1 TABLET BY MOUTH EVERY DAY, Disp: 90 tablet, Rfl: 0   LORazepam  (ATIVAN ) 1 MG tablet, Take 0.5-1 tablets (0.5-1 mg total) by mouth daily as needed for anxiety (sparingly for severe anxiety/panic attacks, Rx to last 6 months)., Disp: 20 tablet, Rfl: 0   metoprolol  succinate (TOPROL -XL) 25 MG 24 hr tablet, TAKE 1 TABLET (25 MG TOTAL) BY MOUTH DAILY., Disp: 90 tablet, Rfl: 0   rosuvastatin  (CRESTOR ) 40 MG tablet, TAKE 1 TABLET BY MOUTH EVERY DAY, Disp: 90 tablet, Rfl: 3   Semaglutide , 2 MG/DOSE, (OZEMPIC , 2 MG/DOSE,) 8 MG/3ML SOPN, INJECT 2 MG AS DIRECTED ONCE A WEEK., Disp: 27 mL, Rfl: 0   tiZANidine  (ZANAFLEX ) 4 MG tablet, SMARTSIG:3 Tablet(s) By Mouth Every Other Day PRN, Disp: , Rfl:    traZODone  (DESYREL ) 50 MG tablet, TAKE 1/2 UP TO 2 TABLETS BY MOUTH AT BEDTIME AS NEEDED FOR SLEEP, Disp: 180 tablet, Rfl: 0   pantoprazole  (PROTONIX ) 40 MG tablet, Take 1 tablet (40 mg total) by mouth 2  (two) times daily., Disp: 180 tablet, Rfl: 0  Patient Active Problem List   Diagnosis Date Noted   Mild episode of recurrent major depressive disorder (HCC) 11/05/2021   Generalized anxiety disorder with panic attacks 11/05/2021   Chronic low back pain 08/04/2020   High risk medication use 05/01/2020   Aortic valve sclerosis 05/01/2020   PAC (premature atrial contraction) 04/24/2019   Sinus bradycardia, persistent 04/24/2019   Exertional dyspnea 04/24/2019   Systolic ejection murmur 02/20/2019   Diabetes mellitus, type II, insulin  dependent (HCC) 04/29/2016   Insomnia 04/02/2015   Class 2 severe obesity with serious comorbidity and body mass index (BMI) of 38.0 to 38.9 in adult (HCC) 09/23/2014   Vitamin D  deficiency 09/23/2014   Screening for colorectal cancer 09/23/2014   Anxiety    Depression    Gastroesophageal reflux disease    Essential hypertension    Mixed hyperlipidemia     Past Surgical History:  Procedure Laterality Date   ANTERIOR CRUCIATE LIGAMENT REPAIR  1997   APPENDECTOMY  1987   CARDIAC EVENT MONITOR  03/2019   No average rate 60 bpm.  Max HR 122, minimum HR 36 bpm-sinus bradycardia with PACs and bigeminy.  Frequent PACs seen as isolated, couplets, triplets as well as frequent bigeminy.:   CESAREAN SECTION  16,10,96,04   CHOLECYSTECTOMY  1987   NM MYOVIEW  LTD  05/2019   Normal EF 60%.  Poor control hypertension.  (194/101 mmHg).  Small size moderate severity fixed defect in the mid anterior and apical anterior wall likely representing breast attenuation artifact.  No evidence of ischemia (reversibility).  LOW RISK   TRANSTHORACIC ECHOCARDIOGRAM  03/2019   EF 55-60 % with normal wall motion.  Slight LA dilation with grade 1 diastolic function.  Moderate aortic valve calcification-sclerosis without stenosis.  Otherwise normal.   uterine ablation      Family History  Problem Relation Age of Onset   Cancer Father        Lymphoma, Leukemia   Depression  Father    Cancer Maternal Grandfather    Cancer Paternal Grandmother    Diabetes Paternal Grandfather    Kidney disease Paternal Grandfather     Social History   Tobacco Use   Smoking status: Never   Smokeless tobacco: Never  Substance Use Topics   Alcohol use: No   Drug use: No     Allergies  Allergen Reactions   Contrast Media [Iodinated Contrast Media] Anaphylaxis   Ambien  [Zolpidem  Tartrate] Other (See Comments)    Family has told her that after taking Ambien , she does "crazy things" that she does not remember doing.    Codeine Nausea And Vomiting    Health Maintenance  Topic Date Due   Zoster Vaccines- Shingrix (1 of 2) Never done   OPHTHALMOLOGY EXAM  10/04/2019   MAMMOGRAM  04/29/2022   Pneumococcal Vaccine 61-67 Years old (2 of 2 - PCV) 05/09/2024 (Originally 12/24/2018)   INFLUENZA VACCINE  11/04/2023   DTaP/Tdap/Td (2 - Td or Tdap) 11/06/2023   HEMOGLOBIN A1C  11/17/2023   FOOT EXAM  03/27/2024   Diabetic kidney evaluation - eGFR measurement  05/19/2024   Diabetic kidney evaluation - Urine ACR  05/19/2024   Fecal DNA (Cologuard)  12/10/2024   Cervical Cancer Screening (HPV/Pap Cotest)  11/09/2027   Hepatitis C Screening  Completed   HIV Screening  Completed   HPV VACCINES  Aged Out   Meningococcal B Vaccine  Aged Out   COVID-19 Vaccine  Discontinued    Chart Review Today: ***  Review of Systems   Objective:   Vitals:   08/08/23 1348  BP: 124/70  Pulse: 64  Resp: 16  SpO2: 98%  Weight: 202 lb (91.6 kg)  Height: 5\' 2"  (1.575 m)    Body mass index is 36.95 kg/m.  Physical Exam   Functional Status Survey:   Results for orders placed or performed in visit on 05/20/23  CBC with Differential/Platelet   Collection Time: 05/20/23 10:18 AM  Result Value Ref Range   WBC 6.3 3.8 - 10.8 Thousand/uL   RBC 5.06 3.80 - 5.10 Million/uL   Hemoglobin 14.8 11.7 - 15.5 g/dL   HCT 54.0 98.1 - 19.1 %   MCV 86.4 80.0 - 100.0 fL   MCH 29.2 27.0 - 33.0 pg    MCHC 33.9 32.0 - 36.0 g/dL   RDW 47.8 29.5 - 62.1 %   Platelets 198 140 - 400 Thousand/uL   MPV 10.6 7.5 - 12.5 fL   Neutro Abs 3,452 1,500 - 7,800 cells/uL   Absolute Lymphocytes 2,085 850 - 3,900 cells/uL   Absolute Monocytes 491 200 - 950 cells/uL   Eosinophils Absolute 233 15 - 500 cells/uL   Basophils Absolute 38 0 - 200 cells/uL   Neutrophils Relative % 54.8 %  Total Lymphocyte 33.1 %   Monocytes Relative 7.8 %   Eosinophils Relative 3.7 %   Basophils Relative 0.6 %  COMPLETE METABOLIC PANEL WITH GFR   Collection Time: 05/20/23 10:18 AM  Result Value Ref Range   Glucose, Bld 98 65 - 99 mg/dL   BUN 15 7 - 25 mg/dL   Creat 1.61 0.96 - 0.45 mg/dL   eGFR 98 > OR = 60 WU/JWJ/1.91Y7   BUN/Creatinine Ratio SEE NOTE: 6 - 22 (calc)   Sodium 139 135 - 146 mmol/L   Potassium 4.5 3.5 - 5.3 mmol/L   Chloride 101 98 - 110 mmol/L   CO2 29 20 - 32 mmol/L   Calcium  9.5 8.6 - 10.4 mg/dL   Total Protein 7.0 6.1 - 8.1 g/dL   Albumin 4.3 3.6 - 5.1 g/dL   Globulin 2.7 1.9 - 3.7 g/dL (calc)   AG Ratio 1.6 1.0 - 2.5 (calc)   Total Bilirubin 0.5 0.2 - 1.2 mg/dL   Alkaline phosphatase (APISO) 49 37 - 153 U/L   AST 22 10 - 35 U/L   ALT 22 6 - 29 U/L  Lipid panel   Collection Time: 05/20/23 10:18 AM  Result Value Ref Range   Cholesterol 164 <200 mg/dL   HDL 65 > OR = 50 mg/dL   Triglycerides 829 (H) <150 mg/dL   LDL Cholesterol (Calc) 75 mg/dL (calc)   Total CHOL/HDL Ratio 2.5 <5.0 (calc)   Non-HDL Cholesterol (Calc) 99 <562 mg/dL (calc)  Hemoglobin Z3Y   Collection Time: 05/20/23 10:18 AM  Result Value Ref Range   Hgb A1c MFr Bld 6.5 (H) <5.7 % of total Hgb   Mean Plasma Glucose 140 mg/dL   eAG (mmol/L) 7.7 mmol/L  Microalbumin / creatinine urine ratio   Collection Time: 05/20/23 10:18 AM  Result Value Ref Range   Creatinine, Urine 175 20 - 275 mg/dL   Microalb, Ur 0.8 mg/dL   Microalb Creat Ratio 5 <30 mg/g creat      Assessment & Plan:   Non-cardiac chest pain -      Ambulatory referral to Gastroenterology  Gastroesophageal reflux disease, unspecified whether esophagitis present -     Pantoprazole  Sodium; Take 1 tablet (40 mg total) by mouth 2 (two) times daily.  Dispense: 180 tablet; Refill: 0 -     Ambulatory referral to Gastroenterology  Dysphagia, unspecified type -     Ambulatory referral to Gastroenterology     No follow-ups on file.   Adeline Hone, PA-C 08/08/23 2:29 PM

## 2023-08-09 ENCOUNTER — Encounter: Payer: Self-pay | Admitting: Gastroenterology

## 2023-08-22 ENCOUNTER — Ambulatory Visit: Admitting: Gastroenterology

## 2023-08-28 ENCOUNTER — Other Ambulatory Visit: Payer: Self-pay | Admitting: Family Medicine

## 2023-08-28 DIAGNOSIS — E782 Mixed hyperlipidemia: Secondary | ICD-10-CM

## 2023-08-28 DIAGNOSIS — G4700 Insomnia, unspecified: Secondary | ICD-10-CM

## 2023-08-31 NOTE — Telephone Encounter (Signed)
 Requested Prescriptions  Pending Prescriptions Disp Refills   traZODone  (DESYREL ) 50 MG tablet [Pharmacy Med Name: TRAZODONE  50 MG TABLET] 180 tablet 0    Sig: TAKE 1/2 UP TO 2 TABLETS BY MOUTH AT BEDTIME AS NEEDED FOR SLEEP     Psychiatry: Antidepressants - Serotonin Modulator Passed - 08/31/2023  3:20 PM      Passed - Completed PHQ-2 or PHQ-9 in the last 360 days      Passed - Valid encounter within last 6 months    Recent Outpatient Visits           3 weeks ago Encounter for examination following treatment at hospital   [facility] Adeline Hone, PA-C   3 months ago Encounter for screening mammogram for malignant neoplasm of breast   [facility] Donny Gall F, FNP       Future Appointments             In 2 months Adeline Hone, PA-C [facility]             rosuvastatin  (CRESTOR ) 40 MG tablet [Pharmacy Med Name: ROSUVASTATIN  CALCIUM  40 MG TAB] 90 tablet 0    Sig: TAKE 1 TABLET BY MOUTH EVERY DAY     Cardiovascular:  Antilipid - Statins 2 Failed - 08/31/2023  3:20 PM      Failed - Lipid Panel in normal range within the last 12 months    Cholesterol  Date Value Ref Range Status  05/20/2023 164 <200 mg/dL Final   LDL Cholesterol (Calc)  Date Value Ref Range Status  05/20/2023 75 mg/dL (calc) Final    Comment:    Reference range: <100 . Desirable range <100 mg/dL for primary prevention;   <70 mg/dL for patients with CHD or diabetic patients  with > or = 2 CHD risk factors. Aaron Aas LDL-C is now calculated using the Martin-Hopkins  calculation, which is a validated novel method providing  better accuracy than the Friedewald equation in the  estimation of LDL-C.  Melinda Sprawls et al. Erroll Heard. 1610;960(45): 2061-2068  (http://education.[employer].com/faq/FAQ164)    HDL  Date Value Ref Range Status  05/20/2023 65 > OR = 50 mg/dL Final   Triglycerides  Date Value Ref Range Status   05/20/2023 164 (H) <150 mg/dL Final         Passed - Cr in normal range and within 360 days    Creat  Date Value Ref Range Status  05/20/2023 0.70 0.50 - 1.05 mg/dL Final   Creatinine, Urine  Date Value Ref Range Status  05/20/2023 175 20 - 275 mg/dL Final         Passed - Patient is not pregnant      Passed - Valid encounter within last 12 months    Recent Outpatient Visits           3 weeks ago Encounter for examination following treatment at hospital   [facility] Adeline Hone, PA-C   3 months ago Encounter for screening mammogram for malignant neoplasm of breast   [facility] Quinton Buckler, FNP       Future Appointments             In 2 months Adeline Hone, PA-C [facility], [facility]

## 2023-09-02 ENCOUNTER — Other Ambulatory Visit: Payer: Self-pay | Admitting: Family Medicine

## 2023-09-02 DIAGNOSIS — J069 Acute upper respiratory infection, unspecified: Secondary | ICD-10-CM

## 2023-09-02 DIAGNOSIS — R0602 Shortness of breath: Secondary | ICD-10-CM

## 2023-09-02 DIAGNOSIS — J40 Bronchitis, not specified as acute or chronic: Secondary | ICD-10-CM

## 2023-09-02 DIAGNOSIS — Z8701 Personal history of pneumonia (recurrent): Secondary | ICD-10-CM

## 2023-09-05 NOTE — Telephone Encounter (Signed)
 Rx 07/18/23 8.5 each  2RF - too soon  Requested Prescriptions  Pending Prescriptions Disp Refills   albuterol  (VENTOLIN  HFA) 108 (90 Base) MCG/ACT inhaler [Pharmacy Med Name: ALBUTEROL  HFA (PROAIR ) INHALER] 8.5 each 2    Sig: INHALE 2 PUFFS BY MOUTH EVERY 4 HOURS AS NEEDED FOR WHEEZE OR FOR SHORTNESS OF BREATH     Pulmonology:  Beta Agonists 2 Passed - 09/05/2023 11:00 AM      Passed - Last BP in normal range    BP Readings from Last 1 Encounters:  08/08/23 124/70         Passed - Last Heart Rate in normal range    Pulse Readings from Last 1 Encounters:  08/08/23 64         Passed - Valid encounter within last 12 months    Recent Outpatient Visits           4 weeks ago Encounter for examination following treatment at hospital   [facility] Adeline Hone, PA-C   3 months ago Encounter for screening mammogram for malignant neoplasm of breast   [facility] Quinton Buckler, FNP       Future Appointments             In 2 months Adeline Hone, PA-C  [facility], [facility]

## 2023-09-12 ENCOUNTER — Other Ambulatory Visit: Payer: Self-pay | Admitting: Family Medicine

## 2023-09-12 ENCOUNTER — Other Ambulatory Visit: Payer: Self-pay | Admitting: Nurse Practitioner

## 2023-09-12 DIAGNOSIS — F330 Major depressive disorder, recurrent, mild: Secondary | ICD-10-CM

## 2023-09-12 DIAGNOSIS — E782 Mixed hyperlipidemia: Secondary | ICD-10-CM

## 2023-09-12 DIAGNOSIS — K219 Gastro-esophageal reflux disease without esophagitis: Secondary | ICD-10-CM

## 2023-09-13 NOTE — Telephone Encounter (Signed)
 Requested by interface [email or web address]. Last refill documented 08/31/23 and 08/08/23 for 3 month supply. 1 refills remains on Protonix . Future visit in 2 months. Too early .  Requested Prescriptions  Refused Prescriptions Disp Refills   rosuvastatin  (CRESTOR ) 40 MG tablet [Pharmacy Med Name: ROSUVASTATIN  CALCIUM  40 MG TAB] 90 tablet 3    Sig: TAKE 1 TABLET BY MOUTH EVERY DAY     Cardiovascular:  Antilipid - Statins 2 Failed - 09/13/2023 11:50 AM      Failed - Lipid Panel in normal range within the last 12 months    Cholesterol  Date Value Ref Range Status  05/20/2023 164 <200 mg/dL Final   LDL Cholesterol (Calc)  Date Value Ref Range Status  05/20/2023 75 mg/dL (calc) Final    Comment:    Reference range: <100 . Desirable range <100 mg/dL for primary prevention;   <70 mg/dL for patients with CHD or diabetic patients  with > or = 2 CHD risk factors. Aaron Aas LDL-C is now calculated using the Martin-Hopkins  calculation, which is a validated novel method providing  better accuracy than the Friedewald equation in the  estimation of LDL-C.  Melinda Sprawls et al. Erroll Heard. 1610;960(45): 2061-2068  (http://education.[facility].com/faq/FAQ164)    HDL  Date Value Ref Range Status  05/20/2023 65 > OR = 50 mg/dL Final   Triglycerides  Date Value Ref Range Status  05/20/2023 164 (H) <150 mg/dL Final         Passed - Cr in normal range and within 360 days    Creat  Date Value Ref Range Status  05/20/2023 0.70 0.50 - 1.05 mg/dL Final   Creatinine, Urine  Date Value Ref Range Status  05/20/2023 175 20 - 275 mg/dL Final         Passed - Patient is not pregnant      Passed - Valid encounter within last 12 months    Recent Outpatient Visits           1 month ago Encounter for examination following treatment at hospital   [facility] Adeline Hone, PA-C   3 months ago Encounter for screening mammogram for malignant neoplasm of breast   [facility] Quinton Buckler, FNP       Future Appointments             In 2 months Adeline Hone, PA-C [facility]             pantoprazole  (PROTONIX ) 40 MG tablet [Pharmacy Med Name: PANTOPRAZOLE  SOD DR 40 MG TAB] 180 tablet 0    Sig: TAKE 1 TABLET BY MOUTH TWICE A DAY     Gastroenterology: Proton Pump Inhibitors Passed - 09/13/2023 11:50 AM      Passed - Valid encounter within last 12 months    Recent Outpatient Visits           1 month ago Encounter for examination following treatment at hospital   [facility] Adeline Hone, PA-C   3 months ago Encounter for screening mammogram for malignant neoplasm of breast   [facility] Quinton Buckler, FNP       Future Appointments             In 2 months Adeline Hone, PA-C [place] [facility]             famotidine  (PEPCID ) 20 MG tablet [Pharmacy Med Name: FAMOTIDINE  20 MG  TABLET] 60 tablet 1    Sig: TAKE 1 TABLET BY MOUTH TWICE A DAY-NOT COVERED BY INSURANCE     Gastroenterology:  H2 Antagonists Passed - 09/13/2023 11:50 AM      Passed - Valid encounter within last 12 months    Recent Outpatient Visits           1 month ago Encounter for examination following treatment at hospital   [facility] Adeline Hone, PA-C   3 months ago Encounter for screening mammogram for malignant neoplasm of breast   [facility] Quinton Buckler, FNP       Future Appointments             In 2 months Adeline Hone, PA-C [facility]

## 2023-09-13 NOTE — Telephone Encounter (Signed)
 Requested Prescriptions  Pending Prescriptions Disp Refills   FLUoxetine  (PROZAC ) 20 MG capsule [Pharmacy Med Name: FLUOXETINE  HCL 20 MG CAPSULE] 90 capsule 1    Sig: TAKE 1 CAPSULE BY MOUTH EVERY DAY     Psychiatry:  Antidepressants - SSRI Passed - 09/13/2023 11:39 AM      Passed - Completed PHQ-2 or PHQ-9 in the last 360 days      Passed - Valid encounter within last 6 months    Recent Outpatient Visits           1 month ago Encounter for examination following treatment at hospital   [facility] Adeline Hone, PA-C   3 months ago Encounter for screening mammogram for malignant neoplasm of breast   [facility] Quinton Buckler, FNP       Future Appointments             In 2 months Adeline Hone, PA-C [facility], [facility]

## 2023-09-26 ENCOUNTER — Encounter: Payer: Self-pay | Admitting: Internal Medicine

## 2023-09-26 ENCOUNTER — Ambulatory Visit: Payer: 59 | Admitting: Internal Medicine

## 2023-09-26 VITALS — BP 120/70 | HR 69 | Ht 62.0 in | Wt 203.0 lb

## 2023-09-26 DIAGNOSIS — E119 Type 2 diabetes mellitus without complications: Secondary | ICD-10-CM

## 2023-09-26 DIAGNOSIS — Z794 Long term (current) use of insulin: Secondary | ICD-10-CM

## 2023-09-26 LAB — POCT GLYCOSYLATED HEMOGLOBIN (HGB A1C): Hemoglobin A1C: 5.9 % — AB (ref 4.0–5.6)

## 2023-09-26 LAB — POCT GLUCOSE (DEVICE FOR HOME USE): POC Glucose: 116 mg/dL — AB (ref 70–99)

## 2023-09-26 MED ORDER — INSULIN PEN NEEDLE 31G X 5 MM MISC: 1.0000 | Freq: Every day | 3 refills | Status: AC

## 2023-09-26 MED ORDER — LANTUS SOLOSTAR 100 UNIT/ML ~~LOC~~ SOPN: 40.0000 [IU] | PEN_INJECTOR | SUBCUTANEOUS | 3 refills | Status: DC

## 2023-09-26 MED ORDER — OZEMPIC (2 MG/DOSE) 8 MG/3ML ~~LOC~~ SOPN: 2.0000 mg | PEN_INJECTOR | SUBCUTANEOUS | 3 refills | Status: DC

## 2023-09-26 NOTE — Patient Instructions (Signed)
 Continue Ozempic  2 mg weekly  Decrease Lantus  40 units daily     HOW TO TREAT LOW BLOOD SUGARS (Blood sugar LESS THAN 70 MG/DL) Please follow the RULE OF 15 for the treatment of hypoglycemia treatment (when your (blood sugars are less than 70 mg/dL)   STEP 1: Take 15 grams of carbohydrates when your blood sugar is low, which includes:  3-4 GLUCOSE TABS  OR 3-4 OZ OF JUICE OR REGULAR SODA OR ONE TUBE OF GLUCOSE GEL    STEP 2: RECHECK blood sugar in 15 MINUTES STEP 3: If your blood sugar is still low at the 15 minute recheck --> then, go back to STEP 1 and treat AGAIN with another 15 grams of carbohydrates.

## 2023-09-26 NOTE — Progress Notes (Signed)
 Name: Alicia Pope  Age/ Sex: 63 y.o., female   MRN/ DOB: [id number], [birth year]     PCP: Leavy Mole, PA-C   Reason for Endocrinology Evaluation: Type 2 Diabetes Mellitus  Initial Endocrine Consultative Visit: 03/15/2018    PATIENT IDENTIFIER: Alicia Pope is a 63 y.o. female with a past medical history of DM, HTN, GERD and dyslipidemia. The patient has followed with Endocrinology clinic since 03/15/2018 for consultative assistance with management of her diabetes.  DIABETIC HISTORY:  Alicia Pope was diagnosed with DM 2004, and started insulin  therapy in 2018, she is intolerant to Invokana  due to recurrent vaginitis.SABRA Her hemoglobin A1c has ranged from 5.7% in 2020, peaking at 9.2% in 2021.   She was followed up by Dr. Kassie between 2019 and May 2023  SUBJECTIVE:   During the last visit (03/28/2023): A1c 6.2%     Today (09/26/2023): Alicia Pope is here for follow-up on diabetes management.  She checks her blood sugars occasionally. The patient has had hypoglycemic episodes since the last clinic visit  She was evaluated for chest pain in May, 2025, troponins negative  She is accompanied by  grand daughter Alicia Pope   Has  nausea vomiting, vomiting is ~ 2x a week  Denies constipation or diarrhea     HOME DIABETES REGIMEN:  Lantus  46 units daily  Ozempic  2 mg weekly    Statin: Yes ACE-I/ARB: Yes    METER DOWNLOAD SUMMARY: n/a    DIABETIC COMPLICATIONS: Microvascular complications:   Denies: CKD, retinopathy, neuropathy  Last Eye Exam: Completed 2024  Macrovascular complications:   Denies: CAD, CVA, PVD   HISTORY:  Past Medical History:  Past Medical History:  Diagnosis Date   Allergy    Anxiety 2013   Asthma 2015   Asthma    Cataract (lens) fragments in eye following cataract surgery, bilateral    Depression 2013   Diabetes mellitus 2004   Finding of multiple premature atrial contractions by electrocardiography    GERD (gastroesophageal  reflux disease) 2013   Hyperlipidemia 1992   Hypertension 1992   Memory impairment    Past Surgical History:  Past Surgical History:  Procedure Laterality Date   ANTERIOR CRUCIATE LIGAMENT REPAIR  1997   APPENDECTOMY  1987   CARDIAC EVENT MONITOR  03/2019   No average rate 60 bpm.  Max HR 122, minimum HR 36 bpm-sinus bradycardia with PACs and bigeminy.  Frequent PACs seen as isolated, couplets, triplets as well as frequent bigeminy.:   CESAREAN SECTION     19,12,11,07   CHOLECYSTECTOMY  1987   NM MYOVIEW  LTD  05/2019   Normal EF 60%.  Poor control hypertension.  (194/101 mmHg).  Small size moderate severity fixed defect in the mid anterior and apical anterior wall likely representing breast attenuation artifact.  No evidence of ischemia (reversibility).  LOW RISK   TRANSTHORACIC ECHOCARDIOGRAM  03/2019   EF 55-60 % with normal wall motion.  Slight LA dilation with grade 1 diastolic function.  Moderate aortic valve calcification-sclerosis without stenosis.  Otherwise normal.   uterine ablation     Social History:  reports that she has never smoked. She has never used smokeless tobacco. She reports that she does not drink alcohol and does not use drugs. Family History:  Family History  Problem Relation Age of Onset   Cancer Father        Lymphoma, Leukemia   Depression Father    Cancer Maternal Grandfather    Cancer Paternal Grandmother  Diabetes Paternal Grandfather    Kidney disease Paternal Grandfather      HOME MEDICATIONS: Allergies as of 09/26/2023       Reactions   Contrast Media [iodinated Contrast Media] Anaphylaxis   Ambien  [zolpidem  Tartrate] Other (See Comments)   Family has told her that after taking Ambien , she does crazy things that she does not remember doing.    Codeine Nausea And Vomiting        Medication List        Accurate as of September 26, 2023  1:43 PM. If you have any questions, ask your nurse or doctor.          albuterol  108 (90 Base)  MCG/ACT inhaler Commonly known as: VENTOLIN  HFA INHALE 2 PUFFS BY MOUTH EVERY 4 HOURS AS NEEDED FOR WHEEZE OR FOR SHORTNESS OF BREATH   aspirin  81 MG tablet Take 1 tablet (81 mg total) by mouth daily.   famotidine  20 MG tablet Commonly known as: Pepcid  Take 1 tablet (20 mg total) by mouth 2 (two) times daily.   FLUoxetine  20 MG capsule Commonly known as: PROZAC  TAKE 1 CAPSULE BY MOUTH EVERY DAY   Insulin  Pen Needle 31G X 5 MM Misc 1 Device by Does not apply route daily in the afternoon.   Lantus  SoloStar 100 UNIT/ML Solostar Pen Generic drug: insulin  glargine Inject 46 Units into the skin every morning.   lisinopril -hydrochlorothiazide  20-12.5 MG tablet Commonly known as: ZESTORETIC  TAKE 1 TABLET BY MOUTH EVERY DAY   LORazepam  1 MG tablet Commonly known as: ATIVAN  Take 0.5-1 tablets (0.5-1 mg total) by mouth daily as needed for anxiety (sparingly for severe anxiety/panic attacks, Rx to last 6 months).   metoprolol  succinate 25 MG 24 hr tablet Commonly known as: TOPROL -XL TAKE 1 TABLET (25 MG TOTAL) BY MOUTH DAILY.   ondansetron  4 MG disintegrating tablet Commonly known as: ZOFRAN -ODT Take 1 tablet (4 mg total) by mouth every 8 (eight) hours as needed for nausea or vomiting.   Ozempic  (2 MG/DOSE) 8 MG/3ML Sopn Generic drug: Semaglutide  (2 MG/DOSE) INJECT 2 MG AS DIRECTED ONCE A WEEK.   pantoprazole  40 MG tablet Commonly known as: PROTONIX  Take 1 tablet (40 mg total) by mouth 2 (two) times daily.   rosuvastatin  40 MG tablet Commonly known as: CRESTOR  TAKE 1 TABLET BY MOUTH EVERY DAY   tiZANidine  4 MG tablet Commonly known as: ZANAFLEX  SMARTSIG:3 Tablet(s) By Mouth Every Other Day PRN   traZODone  50 MG tablet Commonly known as: DESYREL  TAKE 1/2 UP TO 2 TABLETS BY MOUTH AT BEDTIME AS NEEDED FOR SLEEP         OBJECTIVE:   Vital Signs: BP 120/70 (BP Location: Left Arm, Patient Position: Sitting, Cuff Size: Normal)   Pulse 69   Ht 5' 2 (1.575 m)   Wt 203  lb (92.1 kg)   SpO2 97%   BMI 37.13 kg/m   Wt Readings from Last 3 Encounters:  09/26/23 203 lb (92.1 kg)  08/08/23 202 lb (91.6 kg)  05/20/23 204 lb 12.8 oz (92.9 kg)     Exam: General: Pt appears well and is in NAD  Lungs: Clear with good BS bilat with no rales, rhonchi, or wheezes  Heart: RRR   Neuro: MS is good with appropriate affect, pt is alert and Ox3    DM foot exam: 03/28/2023  The skin of the feet is intact without sores or ulcerations. The pedal pulses are 2+ on right and 2+ on left. The sensation is intact  to a screening 5.07, 10 gram monofilament bilaterally    DATA REVIEWED:  Lab Results  Component Value Date   HGBA1C 5.9 (A) 09/26/2023   HGBA1C 6.5 (H) 05/20/2023   HGBA1C 6.2 (A) 03/28/2023    Latest Reference Range & Units 11/09/22 14:52  Sodium 135 - 146 mmol/L 140  Potassium 3.5 - 5.3 mmol/L 4.1  Chloride 98 - 110 mmol/L 103  CO2 20 - 32 mmol/L 29  Glucose 65 - 99 mg/dL 71  Mean Plasma Glucose mg/dL 865  BUN 7 - 25 mg/dL 13  Creatinine 9.49 - 8.94 mg/dL 9.21  Calcium  8.6 - 10.4 mg/dL 9.7  BUN/Creatinine Ratio 6 - 22 (calc) SEE NOTE:  eGFR > OR = 60 mL/min/1.9m2 86  AG Ratio 1.0 - 2.5 (calc) 1.6  AST 10 - 35 U/L 14  ALT 6 - 29 U/L 14  Total Protein 6.1 - 8.1 g/dL 6.8  Total Bilirubin 0.2 - 1.2 mg/dL 0.5    Latest Reference Range & Units 11/09/22 14:52  Total CHOL/HDL Ratio <5.0 (calc) 3.2  Cholesterol <200 mg/dL 834  HDL Cholesterol > OR = 50 mg/dL 52  LDL Cholesterol (Calc) mg/dL (calc) 85  Non-HDL Cholesterol (Calc) <130 mg/dL (calc) 886  Triglycerides <150 mg/dL 809 (H)  Alkaline phosphatase (APISO) 37 - 153 U/L 57  Globulin 1.9 - 3.7 g/dL (calc) 2.6     In office BG 116 mg/DL  ASSESSMENT / PLAN / RECOMMENDATIONS:   1) Type 2 Diabetes Mellitus, Optimally controlled, Without complications - Most recent A1c of 5.9%. Goal A1c < 7.0 %.    -A1c continues up to be optimal -She does endorse nausea and rare vomiting with Ozempic ,  but she does not want to decrease the dose nor discontinue - Dexcom is cost prohibitive ~ $250 a month -Intolerant to invokana  due to recurrent genital infections - I will decrease Lantus  as below to prevent hypoglycemia  MEDICATIONS: Continue Ozempic  2 mg weekly Decrease Lantus  40 units daily  EDUCATION / INSTRUCTIONS: BG monitoring instructions: Patient is instructed to check her blood sugars 1 times a day, fast thing. Call [facility] if: BG persistently < 70  I reviewed the Rule of 15 for the treatment of hypoglycemia in detail with the patient. Literature supplied.   2) Diabetic complications:  Eye: Does not have known diabetic retinopathy.  Neuro/ Feet: Does not have known diabetic peripheral neuropathy .  Renal: Patient does not have known baseline CKD. She   is  on an ACEI/ARB at present.     F/U in 6 months   Signed electronically by: Stefano Redgie Butts, MD  [facility] Endocrinology  [facility] [address] [place] [place], [place] [address] Phone: [phone] FAX: [phone]   CC: Leavy Michelene RIGGERS [address] [place] [address] Phone: [phone]  Fax: [phone]  Return to Endocrinology clinic as below: Future Appointments  Date Time Provider Department Center  11/18/2023  1:20 PM Leavy Michelene, PA-C [facility]

## 2023-10-01 ENCOUNTER — Other Ambulatory Visit: Payer: Self-pay | Admitting: Family Medicine

## 2023-10-02 ENCOUNTER — Other Ambulatory Visit: Payer: Self-pay | Admitting: Nurse Practitioner

## 2023-10-02 ENCOUNTER — Other Ambulatory Visit: Payer: Self-pay | Admitting: Family Medicine

## 2023-10-02 DIAGNOSIS — J069 Acute upper respiratory infection, unspecified: Secondary | ICD-10-CM

## 2023-10-02 DIAGNOSIS — F330 Major depressive disorder, recurrent, mild: Secondary | ICD-10-CM

## 2023-10-02 DIAGNOSIS — K219 Gastro-esophageal reflux disease without esophagitis: Secondary | ICD-10-CM

## 2023-10-02 DIAGNOSIS — R0602 Shortness of breath: Secondary | ICD-10-CM

## 2023-10-02 DIAGNOSIS — E782 Mixed hyperlipidemia: Secondary | ICD-10-CM

## 2023-10-02 DIAGNOSIS — Z8701 Personal history of pneumonia (recurrent): Secondary | ICD-10-CM

## 2023-10-02 DIAGNOSIS — J40 Bronchitis, not specified as acute or chronic: Secondary | ICD-10-CM

## 2023-10-04 NOTE — Telephone Encounter (Signed)
 Requested by interface surescripts. Future visit in 1 month.  Requested Prescriptions  Pending Prescriptions Disp Refills   famotidine  (PEPCID ) 20 MG tablet [Pharmacy Med Name: FAMOTIDINE  20 MG TABLET] 60 tablet 0    Sig: TAKE 1 TABLET BY MOUTH TWICE A DAY-NOT COVERED BY INSURANCE     Gastroenterology:  H2 Antagonists Passed - 10/04/2023  8:29 AM      Passed - Valid encounter within last 12 months    Recent Outpatient Visits           1 month ago Encounter for examination following treatment at hospital   [facility] Leavy Mole, PA-C   4 months ago Encounter for screening mammogram for malignant neoplasm of breast   [facility] Gareth Mliss FALCON, FNP       Future Appointments             In 1 month Leavy Mole, PA-C [facility], [facility]

## 2023-10-04 NOTE — Telephone Encounter (Signed)
 Requested Prescriptions  Pending Prescriptions Disp Refills   albuterol  (VENTOLIN  HFA) 108 (90 Base) MCG/ACT inhaler [Pharmacy Med Name: ALBUTEROL  HFA (PROAIR ) INHALER] 8.5 each 0    Sig: INHALE 2 PUFFS BY MOUTH EVERY 4 HOURS AS NEEDED FOR WHEEZE OR FOR SHORTNESS OF BREATH     Pulmonology:  Beta Agonists 2 Passed - 10/04/2023  2:12 PM      Passed - Last BP in normal range    BP Readings from Last 1 Encounters:  09/26/23 120/70         Passed - Last Heart Rate in normal range    Pulse Readings from Last 1 Encounters:  09/26/23 69         Passed - Valid encounter within last 12 months    Recent Outpatient Visits           1 month ago Encounter for examination following treatment at hospital   [facility] Leavy Mole, PA-C   4 months ago Encounter for screening mammogram for malignant neoplasm of breast   [facility] Gareth Mliss FALCON, FNP       Future Appointments             In 1 month Leavy Mole, PA-C [facility], [facility]             pantoprazole  (PROTONIX ) 40 MG tablet [Pharmacy Med Name: PANTOPRAZOLE  SOD DR 40 MG TAB] 180 tablet 0    Sig: TAKE 1 TABLET BY MOUTH TWICE A DAY     Gastroenterology: Proton Pump Inhibitors Passed - 10/04/2023  2:12 PM      Passed - Valid encounter within last 12 months    Recent Outpatient Visits           1 month ago Encounter for examination following treatment at hospital   [facility] Leavy Mole, PA-C   4 months ago Encounter for screening mammogram for malignant neoplasm of breast   [facility] Gareth Mliss FALCON, FNP       Future Appointments             In 1 month Leavy Mole, PA-C [facility], [facility]             rosuvastatin  (CRESTOR ) 40 MG tablet [Pharmacy Med Name: ROSUVASTATIN  CALCIUM  40 MG TAB] 90 tablet 0    Sig: TAKE 1 TABLET BY MOUTH EVERY DAY      Cardiovascular:  Antilipid - Statins 2 Failed - 10/04/2023  2:12 PM      Failed - Lipid Panel in normal range within the last 12 months    Cholesterol  Date Value Ref Range Status  05/20/2023 164 <200 mg/dL Final   LDL Cholesterol (Calc)  Date Value Ref Range Status  05/20/2023 75 mg/dL (calc) Final    Comment:    Reference range: <100 . Desirable range <100 mg/dL for primary prevention;   <70 mg/dL for patients with CHD or diabetic patients  with > or = 2 CHD risk factors. SABRA LDL-C is now calculated using the Martin-Hopkins  calculation, which is a validated novel method providing  better accuracy than the Friedewald equation in the  estimation of LDL-C.  Gladis APPLETHWAITE et al. SANDREA. 7986;689(80): 2061-2068  (http://education.[employer].com/faq/FAQ164)    HDL  Date Value Ref Range Status  05/20/2023 65 > OR = 50 mg/dL Final   Triglycerides  Date Value Ref Range Status  05/20/2023 164 (  H) <150 mg/dL Final         Passed - Cr in normal range and within 360 days    Creat  Date Value Ref Range Status  05/20/2023 0.70 0.50 - 1.05 mg/dL Final   Creatinine, Urine  Date Value Ref Range Status  05/20/2023 175 20 - 275 mg/dL Final         Passed - Patient is not pregnant      Passed - Valid encounter within last 12 months    Recent Outpatient Visits           1 month ago Encounter for examination following treatment at hospital   [facility] Leavy Mole, PA-C   4 months ago Encounter for screening mammogram for malignant neoplasm of breast   [facility] Gareth Mliss FALCON, FNP       Future Appointments             In 1 month Leavy Mole, PA-C [facility], [facility]

## 2023-10-04 NOTE — Telephone Encounter (Signed)
 Requested Prescriptions  Refused Prescriptions Disp Refills   FLUoxetine  (PROZAC ) 20 MG capsule [Pharmacy Med Name: FLUOXETINE  HCL 20 MG CAPSULE] 90 capsule 1    Sig: TAKE 1 CAPSULE BY MOUTH EVERY DAY     Psychiatry:  Antidepressants - SSRI Passed - 10/04/2023  2:07 PM      Passed - Completed PHQ-2 or PHQ-9 in the last 360 days      Passed - Valid encounter within last 6 months    Recent Outpatient Visits           1 month ago Encounter for examination following treatment at hospital   [facility] Leavy Mole, PA-C   4 months ago Encounter for screening mammogram for malignant neoplasm of breast   [facility] Gareth Mliss FALCON, FNP       Future Appointments             In 1 month Leavy Mole, PA-C [facility], [facility]

## 2023-10-24 ENCOUNTER — Other Ambulatory Visit: Payer: Self-pay | Admitting: Nurse Practitioner

## 2023-10-24 ENCOUNTER — Other Ambulatory Visit: Payer: Self-pay | Admitting: Family Medicine

## 2023-10-24 DIAGNOSIS — J40 Bronchitis, not specified as acute or chronic: Secondary | ICD-10-CM

## 2023-10-24 DIAGNOSIS — F330 Major depressive disorder, recurrent, mild: Secondary | ICD-10-CM

## 2023-10-24 DIAGNOSIS — R0602 Shortness of breath: Secondary | ICD-10-CM

## 2023-10-24 DIAGNOSIS — J069 Acute upper respiratory infection, unspecified: Secondary | ICD-10-CM

## 2023-10-24 DIAGNOSIS — Z8701 Personal history of pneumonia (recurrent): Secondary | ICD-10-CM

## 2023-10-26 NOTE — Telephone Encounter (Signed)
 Refused Prozac  because it's being requested too soon.

## 2023-10-26 NOTE — Telephone Encounter (Signed)
 Requested Prescriptions  Pending Prescriptions Disp Refills   albuterol  (VENTOLIN  HFA) 108 (90 Base) MCG/ACT inhaler [Pharmacy Med Name: ALBUTEROL  HFA (PROAIR ) INHALER] 8.5 each 0    Sig: INHALE 2 PUFFS BY MOUTH EVERY 4 HOURS AS NEEDED FOR WHEEZE OR FOR SHORTNESS OF BREATH     Pulmonology:  Beta Agonists 2 Passed - 10/26/2023  1:08 PM      Passed - Last BP in normal range    BP Readings from Last 1 Encounters:  09/26/23 120/70         Passed - Last Heart Rate in normal range    Pulse Readings from Last 1 Encounters:  09/26/23 69         Passed - Valid encounter within last 12 months    Recent Outpatient Visits           2 months ago Encounter for examination following treatment at hospital   [facility] Leavy Mole, PA-C   5 months ago Encounter for screening mammogram for malignant neoplasm of breast   [facility] Gareth Mliss FALCON, FNP       Future Appointments             In 3 weeks Leavy Mole, PA-C [facility], [facility]             famotidine  (PEPCID ) 20 MG tablet [Pharmacy Med Name: FAMOTIDINE  20 MG TABLET] 60 tablet 0    Sig: TAKE 1 TABLET BY MOUTH TWICE A DAY-NOT COVERED BY INSURANCE     Gastroenterology:  H2 Antagonists Passed - 10/26/2023  1:08 PM      Passed - Valid encounter within last 12 months    Recent Outpatient Visits           2 months ago Encounter for examination following treatment at hospital   [facility] Leavy Mole, PA-C   5 months ago Encounter for screening mammogram for malignant neoplasm of breast   [facility] Gareth Mliss FALCON, FNP       Future Appointments             In 3 weeks Leavy Mole, PA-C [facility], [facility]

## 2023-11-12 ENCOUNTER — Other Ambulatory Visit: Payer: Self-pay | Admitting: Family Medicine

## 2023-11-12 ENCOUNTER — Other Ambulatory Visit: Payer: Self-pay | Admitting: Nurse Practitioner

## 2023-11-12 DIAGNOSIS — Z8701 Personal history of pneumonia (recurrent): Secondary | ICD-10-CM

## 2023-11-12 DIAGNOSIS — G4700 Insomnia, unspecified: Secondary | ICD-10-CM

## 2023-11-12 DIAGNOSIS — K219 Gastro-esophageal reflux disease without esophagitis: Secondary | ICD-10-CM

## 2023-11-12 DIAGNOSIS — J40 Bronchitis, not specified as acute or chronic: Secondary | ICD-10-CM

## 2023-11-12 DIAGNOSIS — F330 Major depressive disorder, recurrent, mild: Secondary | ICD-10-CM

## 2023-11-12 DIAGNOSIS — R0602 Shortness of breath: Secondary | ICD-10-CM

## 2023-11-12 DIAGNOSIS — J069 Acute upper respiratory infection, unspecified: Secondary | ICD-10-CM

## 2023-11-12 DIAGNOSIS — I491 Atrial premature depolarization: Secondary | ICD-10-CM

## 2023-11-15 NOTE — Telephone Encounter (Signed)
 Requested Prescriptions  Pending Prescriptions Disp Refills   FLUoxetine  (PROZAC ) 20 MG capsule [Pharmacy Med Name: FLUOXETINE  HCL 20 MG CAPSULE] 90 capsule 1    Sig: TAKE 1 CAPSULE BY MOUTH EVERY DAY     Psychiatry:  Antidepressants - SSRI Passed - 11/15/2023  5:09 PM      Passed - Completed PHQ-2 or PHQ-9 in the last 360 days      Passed - Valid encounter within last 6 months    Recent Outpatient Visits           3 months ago Encounter for examination following treatment at hospital   [facility] Leavy Mole, PA-C   5 months ago Encounter for screening mammogram for malignant neoplasm of breast   [facility] Gareth Mliss FALCON, FNP       Future Appointments             In 3 days Leavy Mole, PA-C [facility], [facility]

## 2023-11-16 NOTE — Telephone Encounter (Signed)
 Requested Prescriptions  Pending Prescriptions Disp Refills   traZODone  (DESYREL ) 50 MG tablet [Pharmacy Med Name: TRAZODONE  50 MG TABLET] 180 tablet 0    Sig: TAKE 1/2 UP TO 2 TABLETS BY MOUTH AT BEDTIME AS NEEDED FOR SLEEP     Psychiatry: Antidepressants - Serotonin Modulator Passed - 11/16/2023  8:20 AM      Passed - Completed PHQ-2 or PHQ-9 in the last 360 days      Passed - Valid encounter within last 6 months    Recent Outpatient Visits           3 months ago Encounter for examination following treatment at hospital   [facility] Leavy Mole, PA-C   6 months ago Encounter for screening mammogram for malignant neoplasm of breast   [facility] Gareth Mliss FALCON, FNP       Future Appointments             In 2 days Leavy Mole, PA-C [facility], [facility]             metoprolol  succinate (TOPROL -XL) 25 MG 24 hr tablet [Pharmacy Med Name: METOPROLOL  SUCC ER 25 MG TAB] 90 tablet 0    Sig: TAKE 1 TABLET (25 MG TOTAL) BY MOUTH DAILY.     Cardiovascular:  Beta Blockers Passed - 11/16/2023  8:20 AM      Passed - Last BP in normal range    BP Readings from Last 1 Encounters:  09/26/23 120/70         Passed - Last Heart Rate in normal range    Pulse Readings from Last 1 Encounters:  09/26/23 69         Passed - Valid encounter within last 6 months    Recent Outpatient Visits           3 months ago Encounter for examination following treatment at hospital   [facility] Leavy Mole, PA-C   6 months ago Encounter for screening mammogram for malignant neoplasm of breast   [facility] Gareth Mliss FALCON, FNP       Future Appointments             In 2 days Leavy Mole, PA-C [facility], [facility]            Refused Prescriptions Disp Refills   famotidine  (PEPCID ) 20 MG tablet [Pharmacy Med Name: FAMOTIDINE  20 MG  TABLET] 60 tablet 0    Sig: TAKE 1 TABLET BY MOUTH TWICE A DAY-NOT COVERED BY INSURANCE     Gastroenterology:  H2 Antagonists Passed - 11/16/2023  8:20 AM      Passed - Valid encounter within last 12 months    Recent Outpatient Visits           3 months ago Encounter for examination following treatment at hospital   [facility] Leavy Mole, PA-C   6 months ago Encounter for screening mammogram for malignant neoplasm of breast   [facility] Gareth Mliss FALCON, FNP       Future Appointments             In 2 days Tapia, Leisa, PA-C [facility], [facility]             albuterol  (VENTOLIN  HFA) 108 (90 Base) MCG/ACT inhaler [Pharmacy Med Name: ALBUTEROL  HFA (PROAIR ) INHALER] 8.5 each 0  Sig: INHALE 2 PUFFS BY MOUTH EVERY 4 HOURS AS NEEDED FOR WHEEZE OR FOR SHORTNESS OF BREATH     Pulmonology:  Beta Agonists 2 Passed - 11/16/2023  8:20 AM      Passed - Last BP in normal range    BP Readings from Last 1 Encounters:  09/26/23 120/70         Passed - Last Heart Rate in normal range    Pulse Readings from Last 1 Encounters:  09/26/23 69         Passed - Valid encounter within last 12 months    Recent Outpatient Visits           3 months ago Encounter for examination following treatment at hospital   [facility] Leavy Mole, PA-C   6 months ago Encounter for screening mammogram for malignant neoplasm of breast   [facility] Gareth Mliss FALCON, FNP       Future Appointments             In 2 days Leavy Mole, PA-C [facility], [facility]             pantoprazole  (PROTONIX ) 40 MG tablet [Pharmacy Med Name: PANTOPRAZOLE  SOD DR 40 MG TAB] 180 tablet 0    Sig: TAKE 1 TABLET BY MOUTH TWICE A DAY     Gastroenterology: Proton Pump Inhibitors Passed - 11/16/2023  8:20 AM      Passed - Valid encounter within last 12 months     Recent Outpatient Visits           3 months ago Encounter for examination following treatment at hospital   [facility] Leavy Mole, PA-C   6 months ago Encounter for screening mammogram for malignant neoplasm of breast   [facility] Gareth Mliss FALCON, FNP       Future Appointments             In 2 days Leavy Mole, PA-C [facility], [facility]

## 2023-11-18 ENCOUNTER — Ambulatory Visit (INDEPENDENT_AMBULATORY_CARE_PROVIDER_SITE_OTHER): Payer: 59 | Admitting: Family Medicine

## 2023-11-18 ENCOUNTER — Encounter: Payer: Self-pay | Admitting: Family Medicine

## 2023-11-18 VITALS — BP 136/82 | HR 63 | Resp 16 | Ht 62.0 in | Wt 201.0 lb

## 2023-11-18 DIAGNOSIS — E119 Type 2 diabetes mellitus without complications: Secondary | ICD-10-CM | POA: Diagnosis not present

## 2023-11-18 DIAGNOSIS — F330 Major depressive disorder, recurrent, mild: Secondary | ICD-10-CM

## 2023-11-18 DIAGNOSIS — R0789 Other chest pain: Secondary | ICD-10-CM

## 2023-11-18 DIAGNOSIS — K219 Gastro-esophageal reflux disease without esophagitis: Secondary | ICD-10-CM | POA: Diagnosis not present

## 2023-11-18 DIAGNOSIS — Z1231 Encounter for screening mammogram for malignant neoplasm of breast: Secondary | ICD-10-CM

## 2023-11-18 DIAGNOSIS — Z23 Encounter for immunization: Secondary | ICD-10-CM

## 2023-11-18 DIAGNOSIS — E782 Mixed hyperlipidemia: Secondary | ICD-10-CM | POA: Diagnosis not present

## 2023-11-18 DIAGNOSIS — F32A Depression, unspecified: Secondary | ICD-10-CM

## 2023-11-18 DIAGNOSIS — I491 Atrial premature depolarization: Secondary | ICD-10-CM

## 2023-11-18 DIAGNOSIS — R1310 Dysphagia, unspecified: Secondary | ICD-10-CM

## 2023-11-18 DIAGNOSIS — T7431XA Adult psychological abuse, confirmed, initial encounter: Secondary | ICD-10-CM

## 2023-11-18 DIAGNOSIS — I10 Essential (primary) hypertension: Secondary | ICD-10-CM

## 2023-11-18 DIAGNOSIS — E1169 Type 2 diabetes mellitus with other specified complication: Secondary | ICD-10-CM

## 2023-11-18 DIAGNOSIS — R112 Nausea with vomiting, unspecified: Secondary | ICD-10-CM

## 2023-11-18 DIAGNOSIS — F419 Anxiety disorder, unspecified: Secondary | ICD-10-CM

## 2023-11-18 MED ORDER — MELOXICAM 15 MG PO TABS: 7.5000 mg | ORAL_TABLET | Freq: Every day | ORAL | 1 refills | Status: DC

## 2023-11-18 MED ORDER — PANTOPRAZOLE SODIUM 40 MG PO TBEC: 40.0000 mg | DELAYED_RELEASE_TABLET | Freq: Two times a day (BID) | ORAL | 0 refills | Status: DC

## 2023-11-18 MED ORDER — ROSUVASTATIN CALCIUM 40 MG PO TABS
40.0000 mg | ORAL_TABLET | Freq: Every day | ORAL | 1 refills | Status: AC
Start: 2023-11-18 — End: ?

## 2023-11-18 MED ORDER — LISINOPRIL-HYDROCHLOROTHIAZIDE 20-12.5 MG PO TABS: 1.0000 | ORAL_TABLET | Freq: Every day | ORAL | 1 refills | Status: DC
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
## 2023-12-12 ENCOUNTER — Other Ambulatory Visit: Payer: Self-pay | Admitting: Family Medicine

## 2023-12-12 DIAGNOSIS — J069 Acute upper respiratory infection, unspecified: Secondary | ICD-10-CM

## 2023-12-12 DIAGNOSIS — K219 Gastro-esophageal reflux disease without esophagitis: Secondary | ICD-10-CM

## 2023-12-12 DIAGNOSIS — J40 Bronchitis, not specified as acute or chronic: Secondary | ICD-10-CM

## 2023-12-12 DIAGNOSIS — Z8701 Personal history of pneumonia (recurrent): Secondary | ICD-10-CM

## 2023-12-12 DIAGNOSIS — R0602 Shortness of breath: Secondary | ICD-10-CM

## 2023-12-13 ENCOUNTER — Encounter: Payer: Self-pay | Admitting: Family Medicine

## 2023-12-13 NOTE — Telephone Encounter (Signed)
 Requested Prescriptions  Pending Prescriptions Disp Refills   pantoprazole  (PROTONIX ) 40 MG tablet [Pharmacy Med Name: PANTOPRAZOLE  SOD DR 40 MG TAB] 180 tablet 0    Sig: TAKE 1 TABLET BY MOUTH TWICE A DAY     Gastroenterology: Proton Pump Inhibitors Passed - 12/13/2023  2:30 PM      Passed - Valid encounter within last 12 months    Recent Outpatient Visits           3 weeks ago Essential hypertension   [place] [facility] Leavy Mole, PA-C   4 months ago Encounter for examination following treatment at hospital   [facility] Leavy Mole, PA-C   6 months ago Encounter for screening mammogram for malignant neoplasm of breast   [facility] Gareth Clarity F, FNP               albuterol  (VENTOLIN  HFA) 108 (90 Base) MCG/ACT inhaler [Pharmacy Med Name: ALBUTEROL  HFA (PROAIR ) INHALER] 8.5 each 0    Sig: INHALE 2 PUFFS BY MOUTH EVERY 4 HOURS AS NEEDED FOR WHEEZE OR FOR SHORTNESS OF BREATH     Pulmonology:  Beta Agonists 2 Passed - 12/13/2023  2:30 PM      Passed - Last BP in normal range    BP Readings from Last 1 Encounters:  11/18/23 136/82         Passed - Last Heart Rate in normal range    Pulse Readings from Last 1 Encounters:  11/18/23 63         Passed - Valid encounter within last 12 months    Recent Outpatient Visits           3 weeks ago Essential hypertension   [place] [facility] Leavy Mole, PA-C   4 months ago Encounter for examination following treatment at hospital   [facility] Leavy Mole, PA-C   6 months ago Encounter for screening mammogram for malignant neoplasm of breast   [facility] Gareth Clarity F, FNP               famotidine  (PEPCID ) 20 MG tablet [Pharmacy Med Name: FAMOTIDINE  20 MG TABLET] 60 tablet 0    Sig: TAKE 1 TABLET BY MOUTH TWICE A DAY-NOT COVERED BY INSURANCE     Gastroenterology:  H2  Antagonists Passed - 12/13/2023  2:30 PM      Passed - Valid encounter within last 12 months    Recent Outpatient Visits           3 weeks ago Essential hypertension   [facility] Leavy Mole, PA-C   4 months ago Encounter for examination following treatment at hospital   [facility] Leavy Mole, PA-C   6 months ago Encounter for screening mammogram for malignant neoplasm of breast   [facility] Gareth Clarity [place], [place]

## 2023-12-14 ENCOUNTER — Encounter: Payer: Self-pay | Admitting: Gastroenterology

## 2023-12-23 ENCOUNTER — Other Ambulatory Visit: Payer: Self-pay | Admitting: Family Medicine

## 2023-12-23 DIAGNOSIS — Z8701 Personal history of pneumonia (recurrent): Secondary | ICD-10-CM

## 2023-12-23 DIAGNOSIS — J40 Bronchitis, not specified as acute or chronic: Secondary | ICD-10-CM

## 2023-12-23 DIAGNOSIS — J069 Acute upper respiratory infection, unspecified: Secondary | ICD-10-CM

## 2023-12-23 DIAGNOSIS — I10 Essential (primary) hypertension: Secondary | ICD-10-CM

## 2023-12-23 DIAGNOSIS — R0602 Shortness of breath: Secondary | ICD-10-CM

## 2023-12-26 NOTE — Telephone Encounter (Signed)
 Refused albuterol  on 12/13/2023 8.5 each, 0 refills sent in;   Zestoretic  20-12.5 mg on 11/18/2023 #90, 1 refill sent in;   Pepcd 12/13/2023 #60, 0 refills given (not covered by insurance).

## 2024-01-11 ENCOUNTER — Other Ambulatory Visit: Payer: Self-pay | Admitting: Family Medicine

## 2024-01-13 ENCOUNTER — Other Ambulatory Visit: Payer: Self-pay | Admitting: Family Medicine

## 2024-01-13 NOTE — Telephone Encounter (Signed)
 Requested Prescriptions  Pending Prescriptions Disp Refills   famotidine  (PEPCID ) 20 MG tablet [Pharmacy Med Name: FAMOTIDINE  20 MG TABLET] 180 tablet 0    Sig: TAKE 1 TABLET BY MOUTH TWICE A DAY-NOT COVERED BY INSURANCE     Gastroenterology:  H2 Antagonists Passed - 01/13/2024 11:53 AM      Passed - Valid encounter within last 12 months    Recent Outpatient Visits           1 month ago Essential hypertension   [facility] Leavy Mole, PA-C   5 months ago Encounter for examination following treatment at hospital   [facility] [place] [facility] Leavy Mole, PA-C   7 months ago Encounter for screening mammogram for malignant neoplasm of breast   [place] [facility] Gareth Mliss [place], [place]

## 2024-01-16 NOTE — Telephone Encounter (Signed)
 Requested medications are due for refill today.  See note  Requested medications are on the active medications list.  no  Last refill. 10/10  Future visit scheduled.   yes  Notes to clinic.  Pharmacy comment: Alternative Requested:THE PRESCRIBED MEDICATION IS NOT COVERED BY INSURANCE. PLEASE CONSIDER CHANGING TO ONE OF THE SUGGESTED COVERED ALTERNATIVES.     Requested Prescriptions  Pending Prescriptions Disp Refills   cimetidine (TAGAMET) 300 MG tablet [Pharmacy Med Name: CIMETIDINE 300 MG TABLET]  0     Gastroenterology:  H2 Antagonists - cimetidine Passed - 01/16/2024  4:27 PM      Passed - Cr in normal range and within 360 days    Creat  Date Value Ref Range Status  05/20/2023 0.70 0.50 - 1.05 mg/dL Final   Creatinine, Urine  Date Value Ref Range Status  05/20/2023 175 20 - 275 mg/dL Final         Passed - Valid encounter within last 12 months    Recent Outpatient Visits           1 month ago Essential hypertension   [place] [facility] Leavy Mole, PA-C   5 months ago Encounter for examination following treatment at hospital   [facility] Leavy Mole, PA-C   8 months ago Encounter for screening mammogram for malignant neoplasm of breast   [facility] Gareth Mliss [place], [place]

## 2024-02-07 ENCOUNTER — Other Ambulatory Visit

## 2024-02-07 ENCOUNTER — Ambulatory Visit: Admitting: Gastroenterology

## 2024-02-07 ENCOUNTER — Encounter: Payer: Self-pay | Admitting: Gastroenterology

## 2024-02-07 VITALS — BP 110/80 | HR 55 | Ht 62.0 in | Wt 198.2 lb

## 2024-02-07 DIAGNOSIS — K219 Gastro-esophageal reflux disease without esophagitis: Secondary | ICD-10-CM

## 2024-02-07 DIAGNOSIS — E119 Type 2 diabetes mellitus without complications: Secondary | ICD-10-CM

## 2024-02-07 DIAGNOSIS — R1310 Dysphagia, unspecified: Secondary | ICD-10-CM

## 2024-02-07 DIAGNOSIS — Z794 Long term (current) use of insulin: Secondary | ICD-10-CM

## 2024-02-07 DIAGNOSIS — R1013 Epigastric pain: Secondary | ICD-10-CM

## 2024-02-07 DIAGNOSIS — R112 Nausea with vomiting, unspecified: Secondary | ICD-10-CM

## 2024-02-07 LAB — CBC WITH DIFFERENTIAL/PLATELET
Basophils Absolute: 0 K/uL (ref 0.0–0.1)
Basophils Relative: 0.7 % (ref 0.0–3.0)
Eosinophils Absolute: 0.2 K/uL (ref 0.0–0.7)
Eosinophils Relative: 2.7 % (ref 0.0–5.0)
HCT: 44.5 % (ref 36.0–46.0)
Hemoglobin: 15 g/dL (ref 12.0–15.0)
Lymphocytes Relative: 39 % (ref 12.0–46.0)
Lymphs Abs: 2.4 K/uL (ref 0.7–4.0)
MCHC: 33.6 g/dL (ref 30.0–36.0)
MCV: 85.3 fl (ref 78.0–100.0)
Monocytes Absolute: 0.5 K/uL (ref 0.1–1.0)
Monocytes Relative: 8.7 % (ref 3.0–12.0)
Neutro Abs: 3 K/uL (ref 1.4–7.7)
Neutrophils Relative %: 48.9 % (ref 43.0–77.0)
Platelets: 204 K/uL (ref 150.0–400.0)
RBC: 5.22 Mil/uL — ABNORMAL HIGH (ref 3.87–5.11)
RDW: 13.9 % (ref 11.5–15.5)
WBC: 6.2 K/uL (ref 4.0–10.5)

## 2024-02-07 LAB — COMPREHENSIVE METABOLIC PANEL WITH GFR
ALT: 17 U/L (ref 0–35)
AST: 17 U/L (ref 0–37)
Albumin: 4.5 g/dL (ref 3.5–5.2)
Alkaline Phosphatase: 47 U/L (ref 39–117)
BUN: 11 mg/dL (ref 6–23)
CO2: 30 meq/L (ref 19–32)
Calcium: 9.4 mg/dL (ref 8.4–10.5)
Chloride: 101 meq/L (ref 96–112)
Creatinine, Ser: 0.79 mg/dL (ref 0.40–1.20)
GFR: 79.47 mL/min (ref >60.00)
Glucose, Bld: 86 mg/dL (ref 70–99)
Potassium: 3.8 meq/L (ref 3.5–5.1)
Sodium: 139 meq/L (ref 135–145)
Total Bilirubin: 0.7 mg/dL (ref 0.2–1.2)
Total Protein: 7.6 g/dL (ref 6.0–8.3)

## 2024-02-07 LAB — LIPASE: Lipase: 51 U/L (ref 11.0–59.0)

## 2024-02-07 MED ORDER — PANTOPRAZOLE SODIUM 40 MG PO TBEC: 40.0000 mg | DELAYED_RELEASE_TABLET | Freq: Two times a day (BID) | ORAL | 2 refills | Status: AC
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
## 2024-02-07 NOTE — Patient Instructions (Addendum)
 _______________________________________________________  If your blood pressure at your visit was 140/90 or greater, please contact your primary care physician to follow up on this.  _______________________________________________________  If you are age 63 or older, your body mass index should be between 23-30. Your Body mass index is 36.25 kg/m. If this is out of the aforementioned range listed, please consider follow up with your Primary Care Provider.  If you are age 59 or younger, your body mass index should be between 19-25. Your Body mass index is 36.25 kg/m. If this is out of the aformentioned range listed, please consider follow up with your Primary Care Provider.   ________________________________________________________  The [facility] providers would like to encourage you to use MYCHART to communicate with providers for non-urgent requests or questions.  Due to long hold times on the telephone, sending your provider a message by [facility] may be a faster and more efficient way to get a response.  Please allow 48 business hours for a response.  Please remember that this is for non-urgent requests.  _______________________________________________________  [facility] is using a team-based approach to care.  Your team is made up of your doctor and two to three APPS. Our APPS (Nurse Practitioners and Physician Assistants) work with your physician to ensure care continuity for you. They are fully qualified to address your health concerns and develop a treatment plan. They communicate directly with your gastroenterologist to care for you. Seeing the Advanced Practice Practitioners on your physician's team can help you by facilitating care more promptly, often allowing for earlier appointments, access to diagnostic testing, procedures, and other specialty referrals.   Your provider has requested that you go to the basement level for lab work before leaving today. Press B on the  elevator. The lab is located at the first door on the left as you exit the elevator.  You have been scheduled for an endoscopy. Please follow written instructions given to you at your visit today.  If you use inhalers (even only as needed), please bring them with you on the day of your procedure.  If you take any of the following medications, they will need to be adjusted prior to your procedure:   DO NOT TAKE 7 DAYS PRIOR TO TEST- Trulicity  (dulaglutide ) Ozempic , Wegovy  (semaglutide ) Mounjaro (tirzepatide) Bydureon Bcise (exanatide extended release)  DO NOT TAKE 1 DAY PRIOR TO YOUR TEST Rybelsus  (semaglutide ) Adlyxin (lixisenatide) Victoza  (liraglutide ) Byetta (exanatide) ___________________________________________________________________________  Due to recent changes in healthcare laws, you may see the results of your imaging and laboratory studies on MyChart before your provider has had a chance to review them.  We understand that in some cases there may be results that are confusing or concerning to you. Not all laboratory results come back in the same time frame and the provider may be waiting for multiple results in order to interpret others.  Please give us  48 hours in order for your provider to thoroughly review all the results before contacting the office for clarification of your results.   Thank you for entrusting me with your care and choosing [facility].  Nestor Blower, PA-C

## 2024-02-10 ENCOUNTER — Ambulatory Visit: Payer: Self-pay | Admitting: Gastroenterology

## 2024-02-15 ENCOUNTER — Encounter: Payer: Self-pay | Admitting: Gastroenterology

## 2024-02-15 NOTE — Progress Notes (Signed)
 Agree with the assessment and plan as outlined by Boone Master, PA-C.

## 2024-02-22 ENCOUNTER — Encounter: Payer: Self-pay | Admitting: Gastroenterology

## 2024-02-22 ENCOUNTER — Ambulatory Visit: Admitting: Gastroenterology

## 2024-02-22 VITALS — BP 121/61 | HR 67 | Temp 98.1°F | Resp 13 | Ht 62.0 in | Wt 198.0 lb

## 2024-02-22 DIAGNOSIS — R1310 Dysphagia, unspecified: Secondary | ICD-10-CM | POA: Diagnosis not present

## 2024-02-22 DIAGNOSIS — K317 Polyp of stomach and duodenum: Secondary | ICD-10-CM | POA: Diagnosis not present

## 2024-02-22 DIAGNOSIS — K2950 Unspecified chronic gastritis without bleeding: Secondary | ICD-10-CM

## 2024-02-22 DIAGNOSIS — R112 Nausea with vomiting, unspecified: Secondary | ICD-10-CM

## 2024-02-22 DIAGNOSIS — R1010 Upper abdominal pain, unspecified: Secondary | ICD-10-CM

## 2024-02-22 MED ORDER — DEXTROSE 5 % IV SOLN: INTRAVENOUS | Status: AC

## 2024-02-22 MED ORDER — SODIUM CHLORIDE 0.9 % IV SOLN: 500.0000 mL | INTRAVENOUS | Status: DC

## 2024-02-22 NOTE — Progress Notes (Signed)
 History and Physical Interval Note:  02/22/2024 9:59 AM  Alicia Pope  has presented today for endoscopic procedure(s), with the diagnosis of  Encounter Diagnosis  Name Primary?   Dysphagia, unspecified type Yes  .  The various methods of evaluation and treatment have been discussed with the patient and/or family. After consideration of risks, benefits and other options for treatment, the patient has consented to  the endoscopic procedure(s).   The patient's history has been reviewed, patient examined, no change in status, stable for endoscopic procedure(s).  I have reviewed the patient's chart and labs.  Questions were answered to the patient's satisfaction.     Hendrik Donath E. Stacia, MD [facility]

## 2024-02-22 NOTE — Op Note (Signed)
 [facility] Patient Name: Alicia Pope Procedure Date: 02/22/2024 10:00 AM MRN: [id number] Endoscopist: Glendia E. Stacia , MD, [id number] Age: 63 Referring MD:  Date of Birth: [birth year] Gender: Female Account #: [id number] Procedure:                Upper GI endoscopy Indications:              Epigastric abdominal pain, Dysphagia, Nausea with                            vomiting Medicines:                Monitored Anesthesia Care Procedure:                Pre-Anesthesia Assessment:                           - Prior to the procedure, a History and Physical                            was performed, and patient medications and                            allergies were reviewed. The patient's tolerance of                            previous anesthesia was also reviewed. The risks                            and benefits of the procedure and the sedation                            options and risks were discussed with the patient.                            All questions were answered, and informed consent                            was obtained. Prior Anticoagulants: The patient has                            taken no anticoagulant or antiplatelet agents. ASA                            Grade Assessment: II - A patient with mild systemic                            disease. After reviewing the risks and benefits,                            the patient was deemed in satisfactory condition to                            undergo the procedure.  After obtaining informed consent, the endoscope was                            passed under direct vision. Throughout the                            procedure, the patient's blood pressure, pulse, and                            oxygen saturations were monitored continuously. The                            Olympus Scope SN [id number] was introduced through the                            mouth, and advanced to the second  part of duodenum.                            The upper GI endoscopy was accomplished without                            difficulty. The patient tolerated the procedure                            well. Scope In: Scope Out: Findings:                 The examined esophagus was normal. Biopsies were                            obtained from the proximal and distal esophagus                            with cold forceps for histology of suspected                            eosinophilic esophagitis.                           Multiple small sessile polyps were found in the                            gastric body. One of these polyps was removed with                            a cold biopsy forceps. Resection and retrieval were                            complete. Estimated blood loss was minimal.                           The exam of the stomach was otherwise normal.                           Biopsies were taken  with a cold forceps in the                            gastric antrum for Helicobacter pylori testing.                            Estimated blood loss was minimal.                           The examined duodenum was normal. Complications:            No immediate complications. Estimated Blood Loss:     Estimated blood loss was minimal. Impression:               - Normal esophagus.                           - Multiple gastric polyps. One was resected and                            retrieved. These were consistent with fundic gland                            polyps.                           - Normal examined duodenum.                           - Biopsies were taken with a cold forceps for                            evaluation of eosinophilic esophagitis.                           - Biopsies were taken with a cold forceps for                            Helicobacter pylori testing.                           - No endoscopic abnormalities to explain any of the                             patient's symptoms. Recommendation:           - Patient has a contact number available for                            emergencies. The signs and symptoms of potential                            delayed complications were discussed with the                            patient. Return to normal activities tomorrow.  Written discharge instructions were provided to the                            patient.                           - Resume previous diet.                           - Continue present medications.                           - Await pathology results.                           - Recommend trial off Ozempic  to see if GI symptoms                            improve. Discuss with prescribing physician. Emojean Gertz E. Stacia, MD 02/22/2024 10:27:23 AM This report has been signed electronically.

## 2024-02-22 NOTE — Progress Notes (Signed)
 Called to room to assist during endoscopic procedure.  Patient ID and intended procedure confirmed with present staff. Received instructions for my participation in the procedure from the performing physician.

## 2024-02-22 NOTE — Progress Notes (Signed)
 Pt's states no medical or surgical changes since previsit or office visit.

## 2024-02-22 NOTE — Progress Notes (Signed)
 A/o x 3, VSS, good SR's, pleased with anesthesia, report to RN

## 2024-02-22 NOTE — Patient Instructions (Signed)
 Please read handouts provided. Continue present medications. Await pathology results. Resume previous diet. Recommend trial off Ozempic  to see if GI symptoms improve.   YOU HAD AN ENDOSCOPIC PROCEDURE TODAY AT THE [facility]:   Refer to the procedure report that was given to you for any specific questions about what was found during the examination.  If the procedure report does not answer your questions, please call your gastroenterologist to clarify.  If you requested that your care partner not be given the details of your procedure findings, then the procedure report has been included in a sealed envelope for you to review at your convenience later.  YOU SHOULD EXPECT: Some feelings of bloating in the abdomen. Passage of more gas than usual.  Walking can help get rid of the air that was put into your GI tract during the procedure and reduce the bloating. If you had a lower endoscopy (such as a colonoscopy or flexible sigmoidoscopy) you may notice spotting of blood in your stool or on the toilet paper. If you underwent a bowel prep for your procedure, you may not have a normal bowel movement for a few days.  Please Note:  You might notice some irritation and congestion in your nose or some drainage.  This is from the oxygen used during your procedure.  There is no need for concern and it should clear up in a day or so.  SYMPTOMS TO REPORT IMMEDIATELY:   Following upper endoscopy (EGD)  Vomiting of blood or coffee ground material  New chest pain or pain under the shoulder blades  Painful or persistently difficult swallowing  New shortness of breath  Fever of 100F or higher  Black, tarry-looking stools  For urgent or emergent issues, a gastroenterologist can be reached at any hour by calling [phone]. Do not use MyChart messaging for urgent concerns.    DIET:  We do recommend a small meal at first, but then you may proceed to your regular diet.  Drink plenty of fluids  but you should avoid alcoholic beverages for 24 hours.  ACTIVITY:  You should plan to take it easy for the rest of today and you should NOT DRIVE or use heavy machinery until tomorrow (because of the sedation medicines used during the test).    FOLLOW UP: Our staff will call the number listed on your records the next business day following your procedure.  We will call around 7:15- 8:00 am to check on you and address any questions or concerns that you may have regarding the information given to you following your procedure. If we do not reach you, we will leave a message.     If any biopsies were taken you will be contacted by phone or by letter within the next 1-3 weeks.  Please call us  at [phone] if you have not heard about the biopsies in 3 weeks.    SIGNATURES/CONFIDENTIALITY: You and/or your care partner have signed paperwork which will be entered into your electronic medical record.  These signatures attest to the fact that that the information above on your After Visit Summary has been reviewed and is understood.  Full responsibility of the confidentiality of this discharge information lies with you and/or your care-partner.

## 2024-02-23 ENCOUNTER — Telehealth: Payer: Self-pay | Admitting: *Deleted

## 2024-02-23 NOTE — Telephone Encounter (Signed)
  Follow up Call-     02/22/2024    9:00 AM  Call back number  Post procedure Call Back phone  # [phone]  Permission to leave phone message Yes     Patient questions:  Do you have a fever, pain , or abdominal swelling? No. Pain Score  0 *  Have you tolerated food without any problems? Yes.    Have you been able to return to your normal activities? Yes.    Do you have any questions about your discharge instructions: Diet   No. Medications  No. Follow up visit  No.  Do you have questions or concerns about your Care? No.  Actions: * If pain score is 4 or above: No action needed, pain <4.

## 2024-02-27 LAB — SURGICAL PATHOLOGY

## 2024-03-05 ENCOUNTER — Ambulatory Visit: Payer: Self-pay | Admitting: Gastroenterology

## 2024-03-05 NOTE — Progress Notes (Signed)
 Alicia Pope,  The biopsies taken from your stomach were notable for mild chronic gastritis (inflammation) which is a common finding, but there was no evidence of Helicobacter pylori infection. This common finding is not likely to explain abdominal pain and there is no specific treatment or further evaluation recommended.  The polyps resected from your stomach were benign fundic gland polyps.  There was no evidence of infection with Helicobacter pylori or intestinal metaplasia/dysplasia.  These types of polyps are typically secondary to acid suppression therapy and no specific follow-up is required for these small, benign polyps.  The biopsies of your esophagus were normal.  There was no evidence of eosinophilic esophagitis and no evidence of reflux esophagitis.  I suspect a lot of your symptoms are likely related to your Ozempic .  I would recommend a trial off this medication to see if your GI symptoms improve.  Please do this only in concert with your prescribing provider.

## 2024-03-13 ENCOUNTER — Other Ambulatory Visit: Payer: Self-pay | Admitting: Internal Medicine

## 2024-03-13 DIAGNOSIS — E119 Type 2 diabetes mellitus without complications: Secondary | ICD-10-CM

## 2024-03-19 ENCOUNTER — Other Ambulatory Visit: Payer: Self-pay | Admitting: Nurse Practitioner

## 2024-03-19 DIAGNOSIS — I10 Essential (primary) hypertension: Secondary | ICD-10-CM

## 2024-03-19 NOTE — Telephone Encounter (Unsigned)
 Copied from CRM #[id number]. Topic: Clinical - Medication Refill >> Mar 19, 2024 11:39 AM Amber H wrote: Medication: lisinopril -hydrochlorothiazide  (ZESTORETIC ) 20-12.5 MG tablet  Has the patient contacted their pharmacy? Yes, patient stated med was denied by provider. I did advise patient that provider was no longer at clinic and would have to schd an appt with new provider.  (Agent: If no, request that the patient contact the pharmacy for the refill. If patient does not wish to contact the pharmacy document the reason why and proceed with request.) (Agent: If yes, when and what did the pharmacy advise?)  This is the patient's preferred pharmacy:  [id number] - [place], [place] - [address] [place] [address] Phone: [phone] Fax: [phone]   Is this the correct pharmacy for this prescription? Yes If no, delete pharmacy and type the correct one.   Has the prescription been filled recently? No  Is the patient out of the medication? No but only has 2 days left.   Has the patient been seen for an appointment in the last year OR does the patient have an upcoming appointment? Yes, last visit was 11/18/2023  Can we respond through MyChart? Yes  Agent: Please be advised that Rx refills may take up to 3 business days. We ask that you follow-up with your pharmacy.

## 2024-03-21 NOTE — Telephone Encounter (Signed)
 Requested refill too early, last refilled 11/28/23 #90/1 refills, will refuse due to this. Upcoming TOC appointment.  Requested Prescriptions  Pending Prescriptions Disp Refills   lisinopril -hydrochlorothiazide  (ZESTORETIC ) 20-12.5 MG tablet 90 tablet 1    Sig: Take 1 tablet by mouth daily.     Cardiovascular:  ACEI + Diuretic Combos Passed - 03/21/2024  5:34 PM      Passed - Na in normal range and within 180 days    Sodium  Date Value Ref Range Status  02/07/2024 139 135 - 145 mEq/L Final         Passed - K in normal range and within 180 days    Potassium  Date Value Ref Range Status  02/07/2024 3.8 3.5 - 5.1 mEq/L Final         Passed - Cr in normal range and within 180 days    Creat  Date Value Ref Range Status  05/20/2023 0.70 0.50 - 1.05 mg/dL Final   Creatinine, Ser  Date Value Ref Range Status  02/07/2024 0.79 0.40 - 1.20 mg/dL Final   Creatinine, Urine  Date Value Ref Range Status  05/20/2023 175 20 - 275 mg/dL Final         Passed - eGFR is 30 or above and within 180 days    GFR, Est African American  Date Value Ref Range Status  05/01/2020 101 > OR = 60 mL/min/1.18m2 Final   GFR, Est Non African American  Date Value Ref Range Status  05/01/2020 87 > OR = 60 mL/min/1.41m2 Final   GFR  Date Value Ref Range Status  02/07/2024 79.47 >60.00 mL/min Final    Comment:    Calculated using the CKD-EPI Creatinine Equation (2021)   eGFR  Date Value Ref Range Status  05/20/2023 98 > OR = 60 mL/min/1.42m2 Final         Passed - Patient is not pregnant      Passed - Last BP in normal range    BP Readings from Last 1 Encounters:  02/22/24 121/61         Passed - Valid encounter within last 6 months    Recent Outpatient Visits           4 months ago Essential hypertension   [facility] [facility] Leavy Mole, PA-C   7 months ago Encounter for examination following treatment at hospital   [facility] Leavy Mole,  PA-C   10 months ago Encounter for screening mammogram for malignant neoplasm of breast   [facility] Gareth Mliss [place], [place]

## 2024-03-22 ENCOUNTER — Telehealth: Payer: Self-pay

## 2024-03-22 DIAGNOSIS — I10 Essential (primary) hypertension: Secondary | ICD-10-CM

## 2024-03-22 MED ORDER — LISINOPRIL-HYDROCHLOROTHIAZIDE 20-12.5 MG PO TABS: 1.0000 | ORAL_TABLET | Freq: Every day | ORAL | 0 refills | Status: AC

## 2024-03-22 NOTE — Telephone Encounter (Signed)
 Copied from CRM #[id number]. Topic: Clinical - Medication Refill >> Mar 19, 2024 11:39 AM Alicia Pope wrote: Medication: lisinopril -hydrochlorothiazide  (ZESTORETIC ) 20-12.5 MG tablet  Has the patient contacted their pharmacy? Yes, patient stated med was denied by provider. I did advise patient that provider was no longer at clinic and would have to schd an appt with new provider.  (Agent: If no, request that the patient contact the pharmacy for the refill. If patient does not wish to contact the pharmacy document the reason why and proceed with request.) (Agent: If yes, when and what did the pharmacy advise?)  This is the patient's preferred pharmacy:  [id number] - [place], [place] - [address] [place] [address] Phone: [phone] Fax: [phone]   Is this the correct pharmacy for this prescription? Yes If no, delete pharmacy and type the correct one.   Has the prescription been filled recently? No  Is the patient out of the medication? No but only has 2 days left.   Has the patient been seen for an appointment in the last year OR does the patient have an upcoming appointment? Yes, last visit was 11/18/2023  Can we respond through MyChart? Yes  Agent: Please be advised that Rx refills may take up to 3 business days. We ask that you follow-up with your pharmacy. >> Mar 22, 2024  9:28 AM Montie POUR wrote: Ms. Hollingworth is calling back to check on above refill. She is complete out as of today. Please check on refill. She is scheduled for an appointment with FNP Gareth in February  This shows in chart:  03/19/24 1413 Reorder Tonette Olam ORN, RN To [phone]  AND Chart does not show it went to her pharmacy: Sig - Route: Take 1 tablet by mouth daily. - Oral   Sent to pharmacy as: lisinopril -hydrochlorothiazide  (ZESTORETIC ) 20-12.5 MG tablet   E-Prescribing Status: Receipt confirmed by pharmacy (11/18/2023  2:07 PM EDT)    >> Mar 19, 2024 11:44 AM  Alicia Pope wrote: No Mychart

## 2024-04-09 ENCOUNTER — Ambulatory Visit: Admitting: Internal Medicine

## 2024-04-10 ENCOUNTER — Other Ambulatory Visit

## 2024-04-10 ENCOUNTER — Ambulatory Visit: Admitting: Internal Medicine

## 2024-04-10 ENCOUNTER — Encounter: Payer: Self-pay | Admitting: Internal Medicine

## 2024-04-10 VITALS — BP 138/90 | Ht 62.0 in | Wt 197.0 lb

## 2024-04-10 DIAGNOSIS — Z794 Long term (current) use of insulin: Secondary | ICD-10-CM | POA: Diagnosis not present

## 2024-04-10 DIAGNOSIS — E785 Hyperlipidemia, unspecified: Secondary | ICD-10-CM

## 2024-04-10 DIAGNOSIS — E119 Type 2 diabetes mellitus without complications: Secondary | ICD-10-CM

## 2024-04-10 MED ORDER — TIRZEPATIDE 5 MG/0.5ML ~~LOC~~ SOAJ: 5.0000 mg | SUBCUTANEOUS | 3 refills | Status: DC

## 2024-04-10 NOTE — Patient Instructions (Addendum)
 Switch Ozempic  to Mounjaro  5 mg weekly Continue Lantus  40 units daily    HOW TO TREAT LOW BLOOD SUGARS (Blood sugar LESS THAN 70 MG/DL) Please follow the RULE OF 15 for the treatment of hypoglycemia treatment (when your (blood sugars are less than 70 mg/dL)   STEP 1: Take 15 grams of carbohydrates when your blood sugar is low, which includes:  3-4 GLUCOSE TABS  OR 3-4 OZ OF JUICE OR REGULAR SODA OR ONE TUBE OF GLUCOSE GEL    STEP 2: RECHECK blood sugar in 15 MINUTES STEP 3: If your blood sugar is still low at the 15 minute recheck --> then, go back to STEP 1 and treat AGAIN with another 15 grams of carbohydrates.

## 2024-04-10 NOTE — Progress Notes (Unsigned)
 "   Name: Alicia Pope  Age/ Sex: 64 y.o., female   MRN/ DOB: [id number], 07/22/1960     PCP: Leavy Mole, PA-C (Inactive)   Reason for Endocrinology Evaluation: Type 2 Diabetes Mellitus  Initial Endocrine Consultative Visit: 03/15/2018    PATIENT IDENTIFIER: Alicia Pope is a 64 y.o. female with a past medical history of DM, HTN, GERD and dyslipidemia. The patient has followed with Endocrinology clinic since 03/15/2018 for consultative assistance with management of her diabetes.  DIABETIC HISTORY:  Alicia Pope was diagnosed with DM 2004, and started insulin  therapy in 2018, she is intolerant to Invokana  due to recurrent vaginitis.SABRA Her hemoglobin A1c has ranged from 5.7% in 2020, peaking at 9.2% in 2021.   She was followed up by Dr. Kassie between 2019 and May 2023  SUBJECTIVE:   During the last visit (09/26/2023): A1c 5.9%     Today (04/10/2024): Alicia Pope is here for follow-up on diabetes management.  She checks her blood sugars daily. No hypoglycemia recently.    She is accompanied by  grand daughter Alicia Pope   The patient has been following up with gastroenterology for dysphagia, GERD.  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  Constipation has resolved  Nausea is improving  She has noted dysphagia    HOME DIABETES REGIMEN:  Lantus  40 units daily  Ozempic  2 mg weekly    Statin: Yes ACE-I/ARB: Yes    METER DOWNLOAD SUMMARY: n/a    DIABETIC COMPLICATIONS: Microvascular complications:   Denies: CKD, retinopathy, neuropathy  Last Eye Exam: Completed 2024  Macrovascular complications:   Denies: CAD, CVA, PVD   HISTORY:  Past Medical History:  Past Medical History:  Diagnosis Date   Allergy    Anxiety 2013   Asthma 2015   Asthma    Cataract (lens) fragments in eye following cataract surgery, bilateral    Depression 2013   Diabetes mellitus 2004   Finding of multiple premature atrial contractions by  electrocardiography    GERD (gastroesophageal reflux disease) 2013   Hyperlipidemia 1992   Hypertension 1992   Memory impairment    PAC (premature atrial contraction)    Past Surgical History:  Past Surgical History:  Procedure Laterality Date   ANTERIOR CRUCIATE LIGAMENT REPAIR  1997   APPENDECTOMY  1987   CARDIAC EVENT MONITOR  03/2019   No average rate 60 bpm.  Max HR 122, minimum HR 36 bpm-sinus bradycardia with PACs and bigeminy.  Frequent PACs seen as isolated, couplets, triplets as well as frequent bigeminy.:   CESAREAN SECTION     [phone]   CHOLECYSTECTOMY  1987   MULTIPLE TOOTH EXTRACTIONS     all teeth   NM MYOVIEW  LTD  05/2019   Normal EF 60%.  Poor control hypertension.  (194/101 mmHg).  Small size moderate severity fixed defect in the mid anterior and apical anterior wall likely representing breast attenuation artifact.  No evidence of ischemia (reversibility).  LOW RISK   TONSILLECTOMY     TRANSTHORACIC ECHOCARDIOGRAM  03/2019   EF 55-60 % with normal wall motion.  Slight LA dilation with grade 1 diastolic function.  Moderate aortic valve calcification-sclerosis without stenosis.  Otherwise normal.   uterine ablation     Social History:  reports that she has never smoked. She has never used smokeless tobacco. She reports that she does not drink alcohol and does not use drugs. Family History:  Family History  Problem Relation Age of Onset  Cancer Father        Lymphoma, Leukemia   Depression Father    Cancer Maternal Grandfather    Cancer Paternal Grandmother    Diabetes Paternal Grandfather    Kidney disease Paternal Grandfather    Colon polyps Paternal Grandfather      HOME MEDICATIONS: Allergies as of 04/10/2024       Reactions   Iodinated Contrast Media Anaphylaxis   Other Reaction(s): Unknown   Ambien  [zolpidem  Tartrate] Other (See Comments)   Family has told her that after taking Ambien , she does crazy things that she does not remember doing.     Codeine Nausea And Vomiting   Other Reaction(s): Unknown        Medication List        Accurate as of April 10, 2024  2:44 PM. If you have any questions, ask your nurse or doctor.          albuterol  108 (90 Base) MCG/ACT inhaler Commonly known as: VENTOLIN  HFA INHALE 2 PUFFS BY MOUTH EVERY 4 HOURS AS NEEDED FOR WHEEZE OR FOR SHORTNESS OF BREATH   aspirin  81 MG tablet Take 1 tablet (81 mg total) by mouth daily.   famotidine  20 MG tablet Commonly known as: PEPCID  TAKE 1 TABLET BY MOUTH TWICE A DAY-NOT COVERED BY INSURANCE   FLUoxetine  20 MG capsule Commonly known as: PROZAC  TAKE 1 CAPSULE BY MOUTH EVERY DAY   Insulin  Pen Needle 31G X 5 MM Misc 1 Device by Does not apply route daily in the afternoon.   Lantus  SoloStar 100 UNIT/ML Solostar Pen Generic drug: insulin  glargine INJECT 46 UNITS INTO THE SKIN EVERY MORNING.   lisinopril -hydrochlorothiazide  20-12.5 MG tablet Commonly known as: ZESTORETIC  Take 1 tablet by mouth daily.   LORazepam  1 MG tablet Commonly known as: ATIVAN  Take 0.5-1 tablets (0.5-1 mg total) by mouth daily as needed for anxiety (sparingly for severe anxiety/panic attacks, Rx to last 6 months).   metoprolol  succinate 25 MG 24 hr tablet Commonly known as: TOPROL -XL TAKE 1 TABLET (25 MG TOTAL) BY MOUTH DAILY.   ondansetron  4 MG disintegrating tablet Commonly known as: ZOFRAN -ODT Take 1 tablet (4 mg total) by mouth every 8 (eight) hours as needed for nausea or vomiting.   Ozempic  (2 MG/DOSE) 8 MG/3ML Sopn Generic drug: Semaglutide  (2 MG/DOSE) 2 mg by Other route once a week.   pantoprazole  40 MG tablet Commonly known as: PROTONIX  Take 1 tablet (40 mg total) by mouth 2 (two) times daily.   rosuvastatin  40 MG tablet Commonly known as: CRESTOR  Take 1 tablet (40 mg total) by mouth daily.   sertraline  100 MG tablet Commonly known as: ZOLOFT  Take 100 mg by mouth daily.   traZODone  50 MG tablet Commonly known as: DESYREL  TAKE 1/2 UP TO  2 TABLETS BY MOUTH AT BEDTIME AS NEEDED FOR SLEEP         OBJECTIVE:   Vital Signs: BP (!) 138/90   Ht 5' 2 (1.575 m)   Wt 197 lb (89.4 kg)   BMI 36.03 kg/m   Wt Readings from Last 3 Encounters:  04/10/24 197 lb (89.4 kg)  02/22/24 198 lb (89.8 kg)  02/07/24 198 lb 3.2 oz (89.9 kg)     Exam: General: Pt appears well and is in NAD  Lungs: Clear with good BS bilat   Heart: RRR   Neuro: MS is good with appropriate affect, pt is alert and Ox3    DM foot exam: 04/10/2024  The skin of the feet  is intact without sores or ulcerations. The pedal pulses are 2+ on right and 2+ on left. The sensation is intact to a screening 5.07, 10 gram monofilament bilaterally    DATA REVIEWED:  Lab Results  Component Value Date   HGBA1C 5.9 (A) 09/26/2023   HGBA1C 6.5 (H) 05/20/2023   HGBA1C 6.2 (A) 03/28/2023    Latest Reference Range & Units 02/07/24 14:34  Sodium 135 - 145 mEq/L 139  Potassium 3.5 - 5.1 mEq/L 3.8  Chloride 96 - 112 mEq/L 101  CO2 19 - 32 mEq/L 30  Glucose 70 - 99 mg/dL 86  BUN 6 - 23 mg/dL 11  Creatinine 9.59 - 8.79 mg/dL 9.20  Calcium  8.4 - 10.5 mg/dL 9.4  Alkaline Phosphatase 39 - 117 U/L 47  Albumin 3.5 - 5.2 g/dL 4.5  Lipase 88.9 - 40.9 U/L 51.0  AST 0 - 37 U/L 17  ALT 0 - 35 U/L 17  Total Protein 6.0 - 8.3 g/dL 7.6  Total Bilirubin 0.2 - 1.2 mg/dL 0.7  GFR >39.99 mL/min 79.47    ASSESSMENT / PLAN / RECOMMENDATIONS:   1) Type 2 Diabetes Mellitus, Optimally controlled, Without complications - Most recent A1c of ***%. Goal A1c < 7.0 %.    -Patient continues with GI side effects to Ozempic , historically she has declined changing due to fear of weight gain.  Today she agreed to try Mounjaro  -Patient may contact our office in 4-6 weeks to increase the dose should she have no side effects - Dexcom is cost prohibitive ~ $250 a month -Intolerant to invokana  due to recurrent genital infections  MEDICATIONS: Switch Ozempic  to Mounjaro  5 mg weekly Continue  Lantus  40 units daily  EDUCATION / INSTRUCTIONS: BG monitoring instructions: Patient is instructed to check her blood sugars 1 times a day, fast thing. Call [facility] if: BG persistently < 70  I reviewed the Rule of 15 for the treatment of hypoglycemia in detail with the patient. Literature supplied.   2) Diabetic complications:  Eye: Does not have known diabetic retinopathy.  Neuro/ Feet: Does not have known diabetic peripheral neuropathy .  Renal: Patient does not have known baseline CKD. She   is  on an ACEI/ARB at present.     F/U in 6 months   Signed electronically by: Stefano Redgie Butts, MD  [facility] End[facility]  [facility] [address] [place] [place], [place] [address] Phone: [phone] FAX: [phone]   CC: Leavy Mole, PA-C (Inactive) No address on file Phone: None  Fax: None  Return to Endocrinology clinic as below: Future Appointments  Date Time Provider Department Center  04/10/2024  3:00 PM Vaudine Dutan, Donell Redgie, MD [facility] None  05/09/2024  2:20 PM Gareth Mliss [place], FNP [facility]    "

## 2024-04-11 ENCOUNTER — Ambulatory Visit: Payer: Self-pay | Admitting: Internal Medicine

## 2024-04-11 LAB — LIPID PANEL
Cholesterol: 161 mg/dL
HDL: 56 mg/dL
LDL Cholesterol (Calc): 74 mg/dL
Non-HDL Cholesterol (Calc): 105 mg/dL
Total CHOL/HDL Ratio: 2.9 (calc)
Triglycerides: 225 mg/dL — ABNORMAL HIGH

## 2024-04-11 LAB — MICROALBUMIN / CREATININE URINE RATIO
Creatinine, Urine: 106 mg/dL (ref 20–275)
Microalb Creat Ratio: 2 mg/g{creat}
Microalb, Ur: 0.2 mg/dL

## 2024-04-11 LAB — HEMOGLOBIN A1C
Hgb A1c MFr Bld: 5.7 % — ABNORMAL HIGH
Mean Plasma Glucose: 117 mg/dL
eAG (mmol/L): 6.5 mmol/L

## 2024-04-13 ENCOUNTER — Telehealth: Payer: Self-pay | Admitting: Dietician

## 2024-04-13 MED ORDER — SEMAGLUTIDE (1 MG/DOSE) 4 MG/3ML ~~LOC~~ SOPN: 1.0000 mg | PEN_INJECTOR | SUBCUTANEOUS | 11 refills | Status: AC

## 2024-04-13 NOTE — Telephone Encounter (Signed)
 Patient called and stated that MD wished to change her to Mounjaro  but this was $1000.  She states that she was taking Ozempic  before (she did not mention GI side affects but noted in chart).  Will forward to MD regarding medication decision.

## 2024-04-30 ENCOUNTER — Other Ambulatory Visit: Payer: Self-pay | Admitting: Nurse Practitioner

## 2024-04-30 DIAGNOSIS — I491 Atrial premature depolarization: Secondary | ICD-10-CM

## 2024-05-01 NOTE — Telephone Encounter (Signed)
 Requested Prescriptions  Pending Prescriptions Disp Refills   metoprolol  succinate (TOPROL -XL) 25 MG 24 hr tablet [Pharmacy Med Name: METOPROLOL  SUCC ER 25 MG TAB] 90 tablet 0    Sig: TAKE 1 TABLET (25 MG TOTAL) BY MOUTH DAILY.     Cardiovascular:  Beta Blockers Failed - 05/01/2024  2:55 PM      Failed - Last BP in normal range    BP Readings from Last 1 Encounters:  04/10/24 (!) 138/90         Passed - Last Heart Rate in normal range    Pulse Readings from Last 1 Encounters:  02/22/24 67         Passed - Valid encounter within last 6 months    Recent Outpatient Visits           5 months ago Essential hypertension   [facility] Leavy Mole, PA-C   8 months ago Encounter for examination following treatment at hospital   [facility] Leavy Mole, [place]   11 months ago Encounter for screening mammogram for malignant neoplasm of breast   [facility] Gareth Mliss [place], [place]

## 2024-05-09 ENCOUNTER — Encounter: Payer: Self-pay | Admitting: Nurse Practitioner

## 2024-05-09 ENCOUNTER — Ambulatory Visit: Admitting: Nurse Practitioner

## 2024-05-09 VITALS — BP 108/78 | HR 58 | Temp 98.1°F | Ht 62.0 in | Wt 197.0 lb

## 2024-05-09 DIAGNOSIS — Z1231 Encounter for screening mammogram for malignant neoplasm of breast: Secondary | ICD-10-CM

## 2024-05-09 DIAGNOSIS — Z8701 Personal history of pneumonia (recurrent): Secondary | ICD-10-CM

## 2024-05-09 DIAGNOSIS — F410 Panic disorder [episodic paroxysmal anxiety] without agoraphobia: Secondary | ICD-10-CM

## 2024-05-09 DIAGNOSIS — F330 Major depressive disorder, recurrent, mild: Secondary | ICD-10-CM

## 2024-05-09 DIAGNOSIS — J40 Bronchitis, not specified as acute or chronic: Secondary | ICD-10-CM

## 2024-05-09 DIAGNOSIS — E66812 Obesity, class 2: Secondary | ICD-10-CM

## 2024-05-09 DIAGNOSIS — Z794 Long term (current) use of insulin: Secondary | ICD-10-CM

## 2024-05-09 DIAGNOSIS — I10 Essential (primary) hypertension: Secondary | ICD-10-CM

## 2024-05-09 DIAGNOSIS — Z6838 Body mass index (BMI) 38.0-38.9, adult: Secondary | ICD-10-CM

## 2024-05-09 DIAGNOSIS — E782 Mixed hyperlipidemia: Secondary | ICD-10-CM

## 2024-05-09 DIAGNOSIS — G4700 Insomnia, unspecified: Secondary | ICD-10-CM

## 2024-05-09 DIAGNOSIS — J069 Acute upper respiratory infection, unspecified: Secondary | ICD-10-CM

## 2024-05-09 DIAGNOSIS — R0602 Shortness of breath: Secondary | ICD-10-CM

## 2024-05-09 DIAGNOSIS — F411 Generalized anxiety disorder: Secondary | ICD-10-CM

## 2024-05-09 DIAGNOSIS — E119 Type 2 diabetes mellitus without complications: Secondary | ICD-10-CM

## 2024-05-09 DIAGNOSIS — K219 Gastro-esophageal reflux disease without esophagitis: Secondary | ICD-10-CM

## 2024-05-09 MED ORDER — ALBUTEROL SULFATE HFA 108 (90 BASE) MCG/ACT IN AERS: 1.0000 | INHALATION_SPRAY | Freq: Four times a day (QID) | RESPIRATORY_TRACT | 5 refills | Status: AC | PRN

## 2024-05-09 NOTE — Progress Notes (Signed)
 "  BP 108/78   Pulse (!) 58   Temp 98.1 F (36.7 C)   Ht 5' 2 (1.575 m)   Wt 197 lb (89.4 kg)   SpO2 99%   BMI 36.03 kg/m    Subjective:    [name], female    DOB: June 30, 1960, 64 y.o.   MRN: [id number]  HPI: Alicia Pope is a 64 y.o. female  Chief Complaint  Patient presents with   Establish Care   Discussed the use of AI scribe software for clinical note transcription with the patient, who gave verbal consent to proceed.  History of Present Illness Alicia Pope is a 64 year old female who presents to establish care with a new provider.  Hyperglycemia and diabetes management - Insulin -dependent type 2 diabetes mellitus - Managed with Lantus  60 units daily and Ozempic  1 mg weekly - Last hemoglobin A1c was 5.7  Hypertension - Managed with lisinopril -hydrochlorothiazide  20-12.5 mg daily and metoprolol  25 mg daily  Gastroesophageal reflux disease and dysphagia - GERD managed with Pepcid  20 mg twice daily and pantoprazole  40 mg twice daily - Persistent dysphagia described as 'everything gets stuck' in her throat - Recent evaluations did not indicate need for esophageal dilation  Anxiety and depression - Managed with fluoxetine  20 mg daily and Ativan  0.5 to 1 tablet daily as needed - Trazodone  50-100 mg at bedtime for insomnia  Obesity - Current weight is 197 pounds Wt Readings from Last 3 Encounters:  05/09/24 197 lb (89.4 kg)  04/10/24 197 lb (89.4 kg)  02/22/24 198 lb (89.8 kg)   Body mass index is 36.03 kg/m.  Flowsheet Row Office Visit from 05/09/2024 in [facility]  1 44 inches     Hyperlipidemia - Managed with rosuvastatin  40 mg daily - Recent labs: triglycerides 225, LDL 70%  Respiratory symptoms - Uses albuterol  inhaler as needed - Difficulty obtaining albuterol  inhaler recently, received notification from [facility]  Vitamin d  deficiency - History of vitamin D  deficiency         05/09/2024    2:12  PM 11/18/2023    1:23 PM 05/20/2023    9:59 AM  Depression screen PHQ 2/9  Decreased Interest 0 0 0  Down, Depressed, Hopeless 0 0 0  PHQ - 2 Score 0 0 0  Altered sleeping 0 0 3  Tired, decreased energy 0 0 2  Change in appetite 0 0 3  Feeling bad or failure about yourself  0 0 0  Trouble concentrating 0 0 0  Moving slowly or fidgety/restless 0 0 0  Suicidal thoughts 0 0 0  PHQ-9 Score 0 0  8   Difficult doing work/chores   Somewhat difficult     Data saved with a previous flowsheet row definition    Relevant past medical, surgical, family and social history reviewed and updated as indicated. Interim medical history since our last visit reviewed. Allergies and medications reviewed and updated.  Review of Systems  Constitutional: Negative for fever or weight change.  Respiratory: Negative for cough and shortness of breath.   Cardiovascular: Negative for chest pain or palpitations.  Gastrointestinal: Negative for abdominal pain, no bowel changes.  Musculoskeletal: Negative for gait problem or joint swelling.  Skin: Negative for rash.  Neurological: Negative for dizziness or headache.  No other specific complaints in a complete review of systems (except as listed in HPI above).      Objective:      BP 108/78  Pulse (!) 58   Temp 98.1 F (36.7 C)   Ht 5' 2 (1.575 m)   Wt 197 lb (89.4 kg)   SpO2 99%   BMI 36.03 kg/m    Wt Readings from Last 3 Encounters:  05/09/24 197 lb (89.4 kg)  04/10/24 197 lb (89.4 kg)  02/22/24 198 lb (89.8 kg)    Physical Exam VITALS: BP- 108/78 MEASUREMENTS: Weight- 197. GENERAL: Alert, cooperative, well developed, no acute distress HEENT: Normocephalic, normal oropharynx, moist mucous membranes CHEST: Clear to auscultation bilaterally, no wheezes, rhonchi, or crackles CARDIOVASCULAR: Normal heart rate and rhythm, S1 and S2 normal without murmurs ABDOMEN: Soft, non-tender, non-distended, without organomegaly, normal bowel  sounds EXTREMITIES: No cyanosis or edema NEUROLOGICAL: Cranial nerves grossly intact, moves all extremities without gross motor or sensory deficit  Results for orders placed or performed in visit on 04/10/24  Lipid panel   Collection Time: 04/10/24  3:13 PM  Result Value Ref Range   Cholesterol 161 <200 mg/dL   HDL 56 > OR = 50 mg/dL   Triglycerides 774 (H) <150 mg/dL   LDL Cholesterol (Calc) 74 mg/dL (calc)   Total CHOL/HDL Ratio 2.9 <5.0 (calc)   Non-HDL Cholesterol (Calc) 105 <130 mg/dL (calc)  Microalbumin / creatinine urine ratio   Collection Time: 04/10/24  3:13 PM  Result Value Ref Range   Creatinine, Urine 106 20 - 275 mg/dL   Microalb, Ur 0.2 mg/dL   Microalb Creat Ratio 2 <30 mg/g creat  Hemoglobin A1c   Collection Time: 04/10/24  3:13 PM  Result Value Ref Range   Hgb A1c MFr Bld 5.7 (H) <5.7 %   Mean Plasma Glucose 117 mg/dL   eAG (mmol/L) 6.5 mmol/L          Assessment & Plan:   Problem List Items Addressed This Visit       Cardiovascular and Mediastinum   Essential hypertension - Primary (Chronic)     Digestive   Gastroesophageal reflux disease     Endocrine   Diabetes mellitus, type II, insulin  dependent (HCC) (Chronic)   Relevant Orders   Ambulatory referral to Ophthalmology     Other   Mixed hyperlipidemia   Class 2 severe obesity with serious comorbidity and body mass index (BMI) of 38.0 to 38.9 in adult   Insomnia   Mild episode of recurrent major depressive disorder   Generalized anxiety disorder with panic attacks   Other Visit Diagnoses       Screening mammogram for breast cancer       Relevant Orders   MM 3D SCREENING MAMMOGRAM BILATERAL BREAST     Upper respiratory tract infection, unspecified type       Relevant Medications   albuterol  (VENTOLIN  HFA) 108 (90 Base) MCG/ACT inhaler     History of pneumonia       Relevant Medications   albuterol  (VENTOLIN  HFA) 108 (90 Base) MCG/ACT inhaler     SOB (shortness of breath)        Relevant Medications   albuterol  (VENTOLIN  HFA) 108 (90 Base) MCG/ACT inhaler     Bronchitis       Relevant Medications   albuterol  (VENTOLIN  HFA) 108 (90 Base) MCG/ACT inhaler        Assessment and Plan Assessment & Plan Type 2 diabetes mellitus, insulin  dependent Diabetes is well controlled with an A1c of 5.7. She is followed by endocrinology and is on Lantus  46 units daily and Ozempic  1 mg weekly. Recent reduction in Ozempic  dosage due to  swallowing issues. - Continue Lantus  46 units daily - Continue follow-up with endocrinology  Essential hypertension Blood pressure is well controlled at 108/78 mmHg. She is on Lisinopril -hydrochlorothiazide  20-12.5 mg daily and Metoprolol  25 mg daily. - Continue Lisinopril -hydrochlorothiazide  20-12.5 mg daily - Continue Metoprolol  25 mg daily  Gastroesophageal reflux disease GERD symptoms persist with difficulty swallowing. She is on Protonix  40 mg twice daily. Gastroenterology evaluation showed no need for esophageal dilation. - Continue Protonix  40 mg twice daily  Mixed hyperlipidemia Cholesterol levels are well controlled with Rosuvastatin  40 mg daily. Recent triglycerides were 225 mg/dL and LDL was 70 mg/dL. - Continue Rosuvastatin  40 mg daily  Class 2 severe obesity Weight is 197 pounds. Weight loss is contributing to improved blood pressure control. - Encouraged weight loss - Encourage continuation of lifestyle modifications, including dietary management and regular exercise. -continue to increase physical activity, getting at least 150 min of physical activity a week.  Work on including runner, broadcasting/film/video 2 days a week.  - continue eating at a calorie deficit 1600-1700 cal a day, eating a well balanced diet with whole foods, avoiding processed foods.   Patient is motivated to continue working on lifestyle modification.    Generalized anxiety disorder with panic attacks Anxiety is managed with Ativan  0.5-1 mg as needed. She reports  mental health is okay. - Continue Ativan  0.5-1 mg as needed  Major depressive disorder, recurrent, mild Depression is managed with Prozac  20 mg daily. She reports mental health is okay.  Insomnia Managed with Trazodone  50-100 mg at bedtime. - Continue Trazodone  50-100 mg at bedtime  General health maintenance She is overdue for a mammogram and diabetic eye exam.        Follow up plan: Return in about 6 months (around 11/06/2024) for follow up. "

## 2024-05-21 ENCOUNTER — Ambulatory Visit: Admitting: Family Medicine

## 2024-07-17 ENCOUNTER — Ambulatory Visit: Admitting: Internal Medicine

## 2024-10-08 ENCOUNTER — Ambulatory Visit: Admitting: Nurse Practitioner
